# Patient Record
Sex: Male | Born: 1935
Health system: Southern US, Community
[De-identification: ages and names within clinical notes are randomized; demographics above are authoritative.]

## PROBLEM LIST (undated history)

## (undated) DIAGNOSIS — I25810 Atherosclerosis of coronary artery bypass graft(s) without angina pectoris: Secondary | ICD-10-CM

## (undated) DIAGNOSIS — N400 Enlarged prostate without lower urinary tract symptoms: Secondary | ICD-10-CM

## (undated) DIAGNOSIS — I10 Essential (primary) hypertension: Secondary | ICD-10-CM

## (undated) DIAGNOSIS — M75100 Unspecified rotator cuff tear or rupture of unspecified shoulder, not specified as traumatic: Secondary | ICD-10-CM

## (undated) DIAGNOSIS — E785 Hyperlipidemia, unspecified: Secondary | ICD-10-CM

## (undated) DIAGNOSIS — I359 Nonrheumatic aortic valve disorder, unspecified: Secondary | ICD-10-CM

## (undated) DIAGNOSIS — Z8551 Personal history of malignant neoplasm of bladder: Secondary | ICD-10-CM

## (undated) DIAGNOSIS — I679 Cerebrovascular disease, unspecified: Secondary | ICD-10-CM

## (undated) DIAGNOSIS — I719 Aortic aneurysm of unspecified site, without rupture: Secondary | ICD-10-CM

## (undated) DIAGNOSIS — C801 Malignant (primary) neoplasm, unspecified: Secondary | ICD-10-CM

## (undated) DIAGNOSIS — E119 Type 2 diabetes mellitus without complications: Secondary | ICD-10-CM

## (undated) HISTORY — DX: Malignant (primary) neoplasm, unspecified: C80.1

## (undated) HISTORY — PX: BLADDER TUMOR EXCISION: SHX238

## (undated) HISTORY — DX: Personal history of malignant neoplasm of bladder: Z85.51

## (undated) HISTORY — DX: Aortic aneurysm of unspecified site, without rupture: I71.9

## (undated) HISTORY — DX: Type 2 diabetes mellitus without complications: E11.9

## (undated) HISTORY — DX: Benign prostatic hyperplasia without lower urinary tract symptoms: N40.0

## (undated) HISTORY — DX: Hyperlipidemia, unspecified: E78.5

## (undated) HISTORY — DX: Unspecified rotator cuff tear or rupture of unspecified shoulder, not specified as traumatic: M75.100

## (undated) HISTORY — DX: Cerebrovascular disease, unspecified: I67.9

## (undated) HISTORY — DX: Atherosclerosis of coronary artery bypass graft(s) without angina pectoris: I25.810

## (undated) HISTORY — DX: Essential (primary) hypertension: I10

## (undated) HISTORY — PX: COLONOSCOPY W/ POLYPECTOMY: SHX1380

## (undated) HISTORY — DX: Nonrheumatic aortic valve disorder, unspecified: I35.9

---

## 1978-01-21 HISTORY — PX: BLEPHAROPLASTY: SUR158

## 1997-08-04 ENCOUNTER — Ambulatory Visit (HOSPITAL_COMMUNITY): Admission: RE | Admit: 1997-08-04 | Discharge: 1997-08-04 | Payer: Self-pay | Admitting: Urology

## 1999-03-29 ENCOUNTER — Encounter (INDEPENDENT_AMBULATORY_CARE_PROVIDER_SITE_OTHER): Payer: Self-pay | Admitting: Specialist

## 1999-03-29 ENCOUNTER — Ambulatory Visit (HOSPITAL_COMMUNITY): Admission: RE | Admit: 1999-03-29 | Discharge: 1999-03-29 | Payer: Self-pay | Admitting: Urology

## 1999-05-14 ENCOUNTER — Encounter: Payer: Self-pay | Admitting: Family Medicine

## 1999-05-14 ENCOUNTER — Encounter: Admission: RE | Admit: 1999-05-14 | Discharge: 1999-05-14 | Payer: Self-pay | Admitting: Family Medicine

## 1999-07-16 ENCOUNTER — Other Ambulatory Visit: Admission: RE | Admit: 1999-07-16 | Discharge: 1999-07-16 | Payer: Self-pay | Admitting: Internal Medicine

## 2000-08-04 ENCOUNTER — Encounter: Admission: RE | Admit: 2000-08-04 | Discharge: 2000-08-04 | Payer: Self-pay | Admitting: Family Medicine

## 2000-08-04 ENCOUNTER — Encounter: Payer: Self-pay | Admitting: Family Medicine

## 2001-01-21 HISTORY — PX: BACK SURGERY: SHX140

## 2001-02-23 ENCOUNTER — Encounter: Payer: Self-pay | Admitting: Family Medicine

## 2001-02-23 ENCOUNTER — Encounter: Admission: RE | Admit: 2001-02-23 | Discharge: 2001-02-23 | Payer: Self-pay | Admitting: Family Medicine

## 2001-03-18 ENCOUNTER — Encounter: Payer: Self-pay | Admitting: Neurosurgery

## 2001-03-18 ENCOUNTER — Ambulatory Visit (HOSPITAL_COMMUNITY): Admission: RE | Admit: 2001-03-18 | Discharge: 2001-03-18 | Payer: Self-pay | Admitting: Neurosurgery

## 2001-03-23 ENCOUNTER — Inpatient Hospital Stay (HOSPITAL_COMMUNITY): Admission: RE | Admit: 2001-03-23 | Discharge: 2001-03-24 | Payer: Self-pay | Admitting: Neurosurgery

## 2001-03-23 ENCOUNTER — Encounter: Payer: Self-pay | Admitting: Neurosurgery

## 2001-03-31 ENCOUNTER — Ambulatory Visit (HOSPITAL_COMMUNITY): Admission: RE | Admit: 2001-03-31 | Discharge: 2001-03-31 | Payer: Self-pay | Admitting: Neurosurgery

## 2003-12-01 ENCOUNTER — Ambulatory Visit: Payer: Self-pay | Admitting: Family Medicine

## 2003-12-30 ENCOUNTER — Ambulatory Visit: Payer: Self-pay | Admitting: Internal Medicine

## 2004-01-09 ENCOUNTER — Ambulatory Visit: Payer: Self-pay

## 2004-01-27 ENCOUNTER — Ambulatory Visit: Payer: Self-pay | Admitting: Internal Medicine

## 2004-01-31 ENCOUNTER — Ambulatory Visit: Payer: Self-pay | Admitting: Internal Medicine

## 2004-05-14 ENCOUNTER — Ambulatory Visit: Payer: Self-pay | Admitting: Internal Medicine

## 2004-07-27 ENCOUNTER — Ambulatory Visit: Payer: Self-pay | Admitting: Internal Medicine

## 2004-09-21 ENCOUNTER — Encounter: Admission: RE | Admit: 2004-09-21 | Discharge: 2004-09-21 | Payer: Self-pay | Admitting: Internal Medicine

## 2004-11-21 ENCOUNTER — Ambulatory Visit: Payer: Self-pay | Admitting: Internal Medicine

## 2004-12-25 ENCOUNTER — Ambulatory Visit: Payer: Self-pay | Admitting: Internal Medicine

## 2005-03-25 ENCOUNTER — Ambulatory Visit: Payer: Self-pay | Admitting: Internal Medicine

## 2005-04-08 ENCOUNTER — Ambulatory Visit: Payer: Self-pay | Admitting: Internal Medicine

## 2005-05-01 ENCOUNTER — Ambulatory Visit: Payer: Self-pay | Admitting: Internal Medicine

## 2005-08-26 ENCOUNTER — Ambulatory Visit: Payer: Self-pay | Admitting: Internal Medicine

## 2005-09-02 ENCOUNTER — Ambulatory Visit: Payer: Self-pay | Admitting: Internal Medicine

## 2005-12-20 ENCOUNTER — Ambulatory Visit: Payer: Self-pay | Admitting: Internal Medicine

## 2006-01-03 ENCOUNTER — Ambulatory Visit: Payer: Self-pay | Admitting: Internal Medicine

## 2006-01-03 ENCOUNTER — Encounter (INDEPENDENT_AMBULATORY_CARE_PROVIDER_SITE_OTHER): Payer: Self-pay | Admitting: Specialist

## 2006-01-21 HISTORY — PX: LUMBAR EPIDURAL INJECTION: SHX1980

## 2006-02-26 ENCOUNTER — Ambulatory Visit: Payer: Self-pay | Admitting: Internal Medicine

## 2006-02-26 LAB — CONVERTED CEMR LAB
BUN: 26 mg/dL — ABNORMAL HIGH (ref 6–23)
Cholesterol: 223 mg/dL (ref 0–200)
Creatinine, Ser: 1.3 mg/dL (ref 0.4–1.5)
Creatinine,U: 90.2 mg/dL
Hgb A1c MFr Bld: 6.2 % — ABNORMAL HIGH (ref 4.6–6.0)
Microalb Creat Ratio: 3.3 mg/g (ref 0.0–30.0)
Microalb, Ur: 0.3 mg/dL (ref 0.0–1.9)
Total CHOL/HDL Ratio: 5.4
Triglycerides: 383 mg/dL (ref 0–149)

## 2006-03-10 ENCOUNTER — Ambulatory Visit: Payer: Self-pay | Admitting: Internal Medicine

## 2006-08-01 ENCOUNTER — Encounter: Payer: Self-pay | Admitting: Internal Medicine

## 2006-08-08 ENCOUNTER — Ambulatory Visit: Payer: Self-pay | Admitting: Internal Medicine

## 2006-08-15 ENCOUNTER — Encounter (INDEPENDENT_AMBULATORY_CARE_PROVIDER_SITE_OTHER): Payer: Self-pay | Admitting: *Deleted

## 2006-08-15 LAB — CONVERTED CEMR LAB
Creatinine,U: 127.5 mg/dL
Microalb, Ur: 1.1 mg/dL (ref 0.0–1.9)

## 2006-08-18 ENCOUNTER — Ambulatory Visit: Payer: Self-pay | Admitting: Internal Medicine

## 2006-08-18 DIAGNOSIS — I359 Nonrheumatic aortic valve disorder, unspecified: Secondary | ICD-10-CM | POA: Insufficient documentation

## 2006-08-18 LAB — CONVERTED CEMR LAB
Cholesterol, target level: 200 mg/dL
LDL Goal: 100 mg/dL

## 2006-08-20 ENCOUNTER — Encounter: Payer: Self-pay | Admitting: Internal Medicine

## 2006-09-08 ENCOUNTER — Ambulatory Visit: Payer: Self-pay | Admitting: Cardiology

## 2006-09-16 ENCOUNTER — Encounter: Payer: Self-pay | Admitting: Internal Medicine

## 2006-09-17 ENCOUNTER — Encounter: Payer: Self-pay | Admitting: Internal Medicine

## 2006-09-17 ENCOUNTER — Encounter: Payer: Self-pay | Admitting: Cardiology

## 2006-09-17 ENCOUNTER — Ambulatory Visit: Payer: Self-pay | Admitting: Cardiology

## 2006-09-17 ENCOUNTER — Ambulatory Visit: Payer: Self-pay

## 2006-09-17 LAB — CONVERTED CEMR LAB
Calcium: 9.4 mg/dL (ref 8.4–10.5)
Chloride: 104 meq/L (ref 96–112)
Creatinine, Ser: 1 mg/dL (ref 0.4–1.5)
GFR calc non Af Amer: 79 mL/min
Glucose, Bld: 85 mg/dL (ref 70–99)
Potassium: 4 meq/L (ref 3.5–5.1)

## 2006-11-05 ENCOUNTER — Ambulatory Visit: Payer: Self-pay | Admitting: Cardiology

## 2006-12-17 ENCOUNTER — Ambulatory Visit: Payer: Self-pay | Admitting: Internal Medicine

## 2006-12-17 DIAGNOSIS — E785 Hyperlipidemia, unspecified: Secondary | ICD-10-CM | POA: Insufficient documentation

## 2006-12-17 DIAGNOSIS — T887XXA Unspecified adverse effect of drug or medicament, initial encounter: Secondary | ICD-10-CM

## 2006-12-17 DIAGNOSIS — I10 Essential (primary) hypertension: Secondary | ICD-10-CM | POA: Insufficient documentation

## 2006-12-22 ENCOUNTER — Encounter (INDEPENDENT_AMBULATORY_CARE_PROVIDER_SITE_OTHER): Payer: Self-pay | Admitting: *Deleted

## 2006-12-22 LAB — CONVERTED CEMR LAB
ALT: 24 units/L (ref 0–53)
AST: 30 units/L (ref 0–37)
Cholesterol: 190 mg/dL (ref 0–200)
Creatinine,U: 184.2 mg/dL
Microalb, Ur: 0.8 mg/dL (ref 0.0–1.9)
Total CHOL/HDL Ratio: 4.7
Triglycerides: 136 mg/dL (ref 0–149)
VLDL: 27 mg/dL (ref 0–40)

## 2006-12-30 ENCOUNTER — Encounter: Payer: Self-pay | Admitting: Internal Medicine

## 2007-01-05 ENCOUNTER — Encounter: Payer: Self-pay | Admitting: Internal Medicine

## 2007-01-05 ENCOUNTER — Telehealth (INDEPENDENT_AMBULATORY_CARE_PROVIDER_SITE_OTHER): Payer: Self-pay | Admitting: *Deleted

## 2007-01-12 ENCOUNTER — Ambulatory Visit: Payer: Self-pay | Admitting: Internal Medicine

## 2007-01-12 ENCOUNTER — Telehealth (INDEPENDENT_AMBULATORY_CARE_PROVIDER_SITE_OTHER): Payer: Self-pay | Admitting: *Deleted

## 2007-01-12 DIAGNOSIS — N400 Enlarged prostate without lower urinary tract symptoms: Secondary | ICD-10-CM | POA: Insufficient documentation

## 2007-01-12 DIAGNOSIS — IMO0001 Reserved for inherently not codable concepts without codable children: Secondary | ICD-10-CM

## 2007-01-13 ENCOUNTER — Ambulatory Visit: Payer: Self-pay | Admitting: Internal Medicine

## 2007-01-14 ENCOUNTER — Ambulatory Visit: Payer: Self-pay | Admitting: Internal Medicine

## 2007-01-14 ENCOUNTER — Encounter (INDEPENDENT_AMBULATORY_CARE_PROVIDER_SITE_OTHER): Payer: Self-pay | Admitting: *Deleted

## 2007-01-19 ENCOUNTER — Encounter: Payer: Self-pay | Admitting: Internal Medicine

## 2007-01-19 ENCOUNTER — Encounter (INDEPENDENT_AMBULATORY_CARE_PROVIDER_SITE_OTHER): Payer: Self-pay | Admitting: *Deleted

## 2007-01-22 HISTORY — PX: ROTATOR CUFF REPAIR: SHX139

## 2007-01-26 ENCOUNTER — Ambulatory Visit: Payer: Self-pay | Admitting: Cardiology

## 2007-02-03 ENCOUNTER — Ambulatory Visit (HOSPITAL_COMMUNITY): Admission: RE | Admit: 2007-02-03 | Discharge: 2007-02-04 | Payer: Self-pay | Admitting: Orthopedic Surgery

## 2007-02-06 ENCOUNTER — Telehealth (INDEPENDENT_AMBULATORY_CARE_PROVIDER_SITE_OTHER): Payer: Self-pay | Admitting: *Deleted

## 2007-02-06 ENCOUNTER — Encounter: Payer: Self-pay | Admitting: Internal Medicine

## 2007-02-09 ENCOUNTER — Ambulatory Visit: Payer: Self-pay | Admitting: Internal Medicine

## 2007-02-27 ENCOUNTER — Encounter: Payer: Self-pay | Admitting: Internal Medicine

## 2007-02-27 ENCOUNTER — Telehealth (INDEPENDENT_AMBULATORY_CARE_PROVIDER_SITE_OTHER): Payer: Self-pay | Admitting: *Deleted

## 2007-03-30 ENCOUNTER — Ambulatory Visit: Payer: Self-pay | Admitting: Cardiology

## 2007-04-03 ENCOUNTER — Encounter: Payer: Self-pay | Admitting: Internal Medicine

## 2007-04-07 ENCOUNTER — Ambulatory Visit: Payer: Self-pay | Admitting: Internal Medicine

## 2007-04-10 ENCOUNTER — Telehealth (INDEPENDENT_AMBULATORY_CARE_PROVIDER_SITE_OTHER): Payer: Self-pay | Admitting: *Deleted

## 2007-04-10 ENCOUNTER — Encounter (INDEPENDENT_AMBULATORY_CARE_PROVIDER_SITE_OTHER): Payer: Self-pay | Admitting: *Deleted

## 2007-04-13 ENCOUNTER — Ambulatory Visit: Payer: Self-pay | Admitting: Internal Medicine

## 2007-05-08 ENCOUNTER — Encounter: Payer: Self-pay | Admitting: Internal Medicine

## 2007-05-19 ENCOUNTER — Ambulatory Visit: Payer: Self-pay | Admitting: Cardiology

## 2007-05-19 LAB — CONVERTED CEMR LAB
AST: 26 units/L (ref 0–37)
Albumin: 4 g/dL (ref 3.5–5.2)
BUN: 23 mg/dL (ref 6–23)
Bilirubin, Direct: 0.1 mg/dL (ref 0.0–0.3)
CO2: 28 meq/L (ref 19–32)
Calcium: 9.4 mg/dL (ref 8.4–10.5)
Creatinine, Ser: 1.1 mg/dL (ref 0.4–1.5)
Direct LDL: 102.1 mg/dL
GFR calc non Af Amer: 70 mL/min
Glucose, Bld: 101 mg/dL — ABNORMAL HIGH (ref 70–99)
Sodium: 139 meq/L (ref 135–145)
Total Protein: 6.8 g/dL (ref 6.0–8.3)
VLDL: 59 mg/dL — ABNORMAL HIGH (ref 0–40)

## 2007-06-10 ENCOUNTER — Ambulatory Visit: Payer: Self-pay | Admitting: Cardiology

## 2007-09-16 ENCOUNTER — Encounter: Payer: Self-pay | Admitting: Internal Medicine

## 2007-09-16 ENCOUNTER — Ambulatory Visit: Payer: Self-pay

## 2007-09-16 ENCOUNTER — Encounter: Payer: Self-pay | Admitting: Cardiology

## 2007-09-16 ENCOUNTER — Ambulatory Visit: Payer: Self-pay | Admitting: Cardiology

## 2007-09-16 LAB — CONVERTED CEMR LAB
ALT: 25 units/L (ref 0–53)
Albumin: 3.9 g/dL (ref 3.5–5.2)
Cholesterol: 169 mg/dL (ref 0–200)
LDL Cholesterol: 99 mg/dL (ref 0–99)
Triglycerides: 154 mg/dL — ABNORMAL HIGH (ref 0–149)

## 2007-11-18 ENCOUNTER — Telehealth (INDEPENDENT_AMBULATORY_CARE_PROVIDER_SITE_OTHER): Payer: Self-pay | Admitting: *Deleted

## 2007-12-01 ENCOUNTER — Encounter (INDEPENDENT_AMBULATORY_CARE_PROVIDER_SITE_OTHER): Payer: Self-pay | Admitting: *Deleted

## 2007-12-10 ENCOUNTER — Telehealth: Payer: Self-pay | Admitting: Internal Medicine

## 2007-12-10 ENCOUNTER — Ambulatory Visit: Payer: Self-pay | Admitting: Internal Medicine

## 2007-12-21 ENCOUNTER — Ambulatory Visit: Payer: Self-pay | Admitting: Internal Medicine

## 2007-12-21 DIAGNOSIS — Z8551 Personal history of malignant neoplasm of bladder: Secondary | ICD-10-CM

## 2007-12-21 LAB — CONVERTED CEMR LAB
BUN: 22 mg/dL (ref 6–23)
Creatinine, Ser: 1 mg/dL (ref 0.4–1.5)
Creatinine,U: 163 mg/dL
Microalb Creat Ratio: 6.1 mg/g (ref 0.0–30.0)
Microalb, Ur: 1 mg/dL (ref 0.0–1.9)

## 2007-12-22 ENCOUNTER — Ambulatory Visit: Payer: Self-pay | Admitting: Internal Medicine

## 2007-12-23 ENCOUNTER — Telehealth (INDEPENDENT_AMBULATORY_CARE_PROVIDER_SITE_OTHER): Payer: Self-pay | Admitting: *Deleted

## 2007-12-28 ENCOUNTER — Encounter: Payer: Self-pay | Admitting: Internal Medicine

## 2008-03-07 ENCOUNTER — Ambulatory Visit: Payer: Self-pay | Admitting: Internal Medicine

## 2008-03-07 ENCOUNTER — Ambulatory Visit: Payer: Self-pay | Admitting: Cardiology

## 2008-03-07 LAB — CONVERTED CEMR LAB
BUN: 29 mg/dL — ABNORMAL HIGH (ref 6–23)
Creatinine, Ser: 1.1 mg/dL (ref 0.4–1.5)
Hgb A1c MFr Bld: 6.3 % — ABNORMAL HIGH (ref 4.6–6.0)

## 2008-03-14 ENCOUNTER — Ambulatory Visit: Payer: Self-pay | Admitting: Internal Medicine

## 2008-03-14 DIAGNOSIS — E119 Type 2 diabetes mellitus without complications: Secondary | ICD-10-CM

## 2008-03-14 DIAGNOSIS — L609 Nail disorder, unspecified: Secondary | ICD-10-CM | POA: Insufficient documentation

## 2008-03-16 ENCOUNTER — Encounter (INDEPENDENT_AMBULATORY_CARE_PROVIDER_SITE_OTHER): Payer: Self-pay | Admitting: *Deleted

## 2008-03-19 ENCOUNTER — Encounter: Payer: Self-pay | Admitting: Internal Medicine

## 2008-05-20 ENCOUNTER — Ambulatory Visit: Payer: Self-pay | Admitting: Internal Medicine

## 2008-05-20 DIAGNOSIS — M2550 Pain in unspecified joint: Secondary | ICD-10-CM | POA: Insufficient documentation

## 2008-05-20 LAB — CONVERTED CEMR LAB
Blood in Urine, dipstick: NEGATIVE
Protein, U semiquant: NEGATIVE
WBC Urine, dipstick: NEGATIVE
pH: 5

## 2008-07-13 ENCOUNTER — Ambulatory Visit: Payer: Self-pay | Admitting: Internal Medicine

## 2008-07-13 DIAGNOSIS — R609 Edema, unspecified: Secondary | ICD-10-CM

## 2008-07-21 ENCOUNTER — Encounter (INDEPENDENT_AMBULATORY_CARE_PROVIDER_SITE_OTHER): Payer: Self-pay | Admitting: *Deleted

## 2008-07-21 ENCOUNTER — Telehealth: Payer: Self-pay | Admitting: Cardiology

## 2008-07-21 LAB — CONVERTED CEMR LAB
ALT: 22 units/L (ref 0–53)
AST: 28 units/L (ref 0–37)

## 2008-09-12 ENCOUNTER — Ambulatory Visit: Payer: Self-pay | Admitting: Internal Medicine

## 2008-09-12 LAB — CONVERTED CEMR LAB
Creatinine,U: 173 mg/dL
Microalb, Ur: 0.7 mg/dL (ref 0.0–1.9)

## 2008-09-16 ENCOUNTER — Encounter: Payer: Self-pay | Admitting: Internal Medicine

## 2008-09-19 ENCOUNTER — Ambulatory Visit: Payer: Self-pay | Admitting: Internal Medicine

## 2008-09-21 ENCOUNTER — Encounter: Payer: Self-pay | Admitting: Cardiology

## 2008-09-21 ENCOUNTER — Ambulatory Visit: Payer: Self-pay

## 2008-09-28 ENCOUNTER — Telehealth: Payer: Self-pay | Admitting: Cardiology

## 2008-10-03 ENCOUNTER — Ambulatory Visit: Payer: Self-pay | Admitting: Internal Medicine

## 2008-10-12 ENCOUNTER — Ambulatory Visit: Payer: Self-pay

## 2008-10-12 ENCOUNTER — Encounter: Payer: Self-pay | Admitting: Cardiology

## 2008-10-19 ENCOUNTER — Ambulatory Visit: Payer: Self-pay | Admitting: Internal Medicine

## 2008-12-07 ENCOUNTER — Encounter (INDEPENDENT_AMBULATORY_CARE_PROVIDER_SITE_OTHER): Payer: Self-pay | Admitting: *Deleted

## 2008-12-19 ENCOUNTER — Telehealth (INDEPENDENT_AMBULATORY_CARE_PROVIDER_SITE_OTHER): Payer: Self-pay | Admitting: *Deleted

## 2009-01-21 HISTORY — PX: CORONARY ARTERY BYPASS GRAFT: SHX141

## 2009-02-08 ENCOUNTER — Encounter: Payer: Self-pay | Admitting: Internal Medicine

## 2009-04-03 ENCOUNTER — Ambulatory Visit: Payer: Self-pay | Admitting: Internal Medicine

## 2009-04-28 ENCOUNTER — Telehealth (INDEPENDENT_AMBULATORY_CARE_PROVIDER_SITE_OTHER): Payer: Self-pay | Admitting: *Deleted

## 2009-05-17 ENCOUNTER — Ambulatory Visit: Payer: Self-pay | Admitting: Internal Medicine

## 2009-05-22 ENCOUNTER — Telehealth (INDEPENDENT_AMBULATORY_CARE_PROVIDER_SITE_OTHER): Payer: Self-pay | Admitting: *Deleted

## 2009-05-22 LAB — CONVERTED CEMR LAB
Creatinine, Ser: 1 mg/dL (ref 0.4–1.5)
Creatinine,U: 73.1 mg/dL
Hgb A1c MFr Bld: 6.4 % (ref 4.6–6.5)

## 2009-05-24 ENCOUNTER — Ambulatory Visit: Payer: Self-pay | Admitting: Internal Medicine

## 2009-05-24 DIAGNOSIS — R079 Chest pain, unspecified: Secondary | ICD-10-CM | POA: Insufficient documentation

## 2009-05-26 ENCOUNTER — Ambulatory Visit: Payer: Self-pay | Admitting: Internal Medicine

## 2009-06-02 ENCOUNTER — Encounter (INDEPENDENT_AMBULATORY_CARE_PROVIDER_SITE_OTHER): Payer: Self-pay | Admitting: *Deleted

## 2009-06-02 ENCOUNTER — Ambulatory Visit: Payer: Self-pay | Admitting: Cardiology

## 2009-06-02 DIAGNOSIS — I208 Other forms of angina pectoris: Secondary | ICD-10-CM

## 2009-06-02 DIAGNOSIS — I679 Cerebrovascular disease, unspecified: Secondary | ICD-10-CM

## 2009-06-05 LAB — CONVERTED CEMR LAB
Albumin: 4 g/dL (ref 3.5–5.2)
BUN: 27 mg/dL — ABNORMAL HIGH (ref 6–23)
Calcium: 9.4 mg/dL (ref 8.4–10.5)
Chloride: 100 meq/L (ref 96–112)
Chloride: 102 meq/L (ref 96–112)
Creatinine, Ser: 0.9 mg/dL (ref 0.4–1.5)
Creatinine, Ser: 1.09 mg/dL (ref 0.40–1.50)
Eosinophils Absolute: 0.4 10*3/uL (ref 0.0–0.7)
Eosinophils Relative: 4 % (ref 0–5)
GFR calc non Af Amer: 90.23 mL/min (ref >60)
Glucose, Bld: 89 mg/dL (ref 70–99)
HCT: 42 % (ref 39.0–52.0)
HDL: 49.2 mg/dL (ref >39.00)
INR: 0.98 (ref <1.50)
Lymphocytes Relative: 31 % (ref 12–46)
Neutro Abs: 4.4 10*3/uL (ref 1.7–7.7)
Neutrophils Relative %: 55 % (ref 43–77)
Platelets: 203 10*3/uL (ref 150–400)
Potassium: 5 meq/L (ref 3.5–5.1)
Prothrombin Time: 12.9 s (ref 11.6–15.2)
RBC: 4.91 M/uL (ref 4.22–5.81)
RDW: 14.1 % (ref 11.5–15.5)
Total Bilirubin: 0.7 mg/dL (ref 0.3–1.2)
Total Protein: 6.9 g/dL (ref 6.0–8.3)

## 2009-06-07 ENCOUNTER — Inpatient Hospital Stay (HOSPITAL_COMMUNITY): Admission: AD | Admit: 2009-06-07 | Discharge: 2009-06-12 | Payer: Self-pay | Admitting: Cardiovascular Disease

## 2009-06-07 ENCOUNTER — Ambulatory Visit: Payer: Self-pay | Admitting: Thoracic Surgery (Cardiothoracic Vascular Surgery)

## 2009-06-07 ENCOUNTER — Encounter: Payer: Self-pay | Admitting: Internal Medicine

## 2009-06-07 ENCOUNTER — Ambulatory Visit: Payer: Self-pay | Admitting: Cardiology

## 2009-06-07 ENCOUNTER — Ambulatory Visit: Payer: Self-pay | Admitting: Cardiovascular Disease

## 2009-06-07 ENCOUNTER — Inpatient Hospital Stay (HOSPITAL_BASED_OUTPATIENT_CLINIC_OR_DEPARTMENT_OTHER): Admission: RE | Admit: 2009-06-07 | Discharge: 2009-06-07 | Payer: Self-pay | Admitting: Cardiovascular Disease

## 2009-06-08 ENCOUNTER — Encounter: Payer: Self-pay | Admitting: Cardiology

## 2009-06-15 ENCOUNTER — Encounter: Payer: Self-pay | Admitting: Cardiovascular Disease

## 2009-06-16 ENCOUNTER — Encounter: Payer: Self-pay | Admitting: Physician Assistant

## 2009-06-20 ENCOUNTER — Ambulatory Visit: Payer: Self-pay | Admitting: Internal Medicine

## 2009-06-20 ENCOUNTER — Encounter: Payer: Self-pay | Admitting: Physician Assistant

## 2009-06-20 DIAGNOSIS — Z951 Presence of aortocoronary bypass graft: Secondary | ICD-10-CM | POA: Insufficient documentation

## 2009-06-20 DIAGNOSIS — I25810 Atherosclerosis of coronary artery bypass graft(s) without angina pectoris: Secondary | ICD-10-CM

## 2009-06-28 ENCOUNTER — Ambulatory Visit: Payer: Self-pay | Admitting: Internal Medicine

## 2009-06-28 DIAGNOSIS — G479 Sleep disorder, unspecified: Secondary | ICD-10-CM | POA: Insufficient documentation

## 2009-07-03 ENCOUNTER — Ambulatory Visit: Payer: Self-pay | Admitting: Thoracic Surgery (Cardiothoracic Vascular Surgery)

## 2009-07-03 ENCOUNTER — Encounter
Admission: RE | Admit: 2009-07-03 | Discharge: 2009-07-03 | Payer: Self-pay | Admitting: Thoracic Surgery (Cardiothoracic Vascular Surgery)

## 2009-07-03 ENCOUNTER — Encounter: Payer: Self-pay | Admitting: Internal Medicine

## 2009-07-03 ENCOUNTER — Telehealth: Payer: Self-pay | Admitting: Cardiology

## 2009-07-04 ENCOUNTER — Telehealth (INDEPENDENT_AMBULATORY_CARE_PROVIDER_SITE_OTHER): Payer: Self-pay | Admitting: *Deleted

## 2009-07-12 ENCOUNTER — Ambulatory Visit: Payer: Self-pay | Admitting: Thoracic Surgery (Cardiothoracic Vascular Surgery)

## 2009-07-12 ENCOUNTER — Encounter
Admission: RE | Admit: 2009-07-12 | Discharge: 2009-07-12 | Payer: Self-pay | Admitting: Thoracic Surgery (Cardiothoracic Vascular Surgery)

## 2009-07-13 ENCOUNTER — Encounter (HOSPITAL_COMMUNITY): Admission: RE | Admit: 2009-07-13 | Discharge: 2009-09-22 | Payer: Self-pay | Admitting: Cardiology

## 2009-07-25 ENCOUNTER — Encounter: Payer: Self-pay | Admitting: Internal Medicine

## 2009-07-26 ENCOUNTER — Telehealth (INDEPENDENT_AMBULATORY_CARE_PROVIDER_SITE_OTHER): Payer: Self-pay | Admitting: *Deleted

## 2009-07-27 ENCOUNTER — Encounter: Payer: Self-pay | Admitting: Cardiology

## 2009-07-28 ENCOUNTER — Ambulatory Visit: Payer: Self-pay | Admitting: Cardiology

## 2009-08-24 ENCOUNTER — Encounter: Payer: Self-pay | Admitting: Cardiology

## 2009-08-28 ENCOUNTER — Telehealth: Payer: Self-pay | Admitting: Cardiology

## 2009-09-07 ENCOUNTER — Telehealth: Payer: Self-pay | Admitting: Internal Medicine

## 2009-09-27 ENCOUNTER — Encounter: Payer: Self-pay | Admitting: Cardiology

## 2009-09-27 ENCOUNTER — Ambulatory Visit: Payer: Self-pay

## 2009-09-29 ENCOUNTER — Encounter: Payer: Self-pay | Admitting: Cardiology

## 2009-10-04 ENCOUNTER — Ambulatory Visit: Payer: Self-pay | Admitting: Cardiology

## 2009-10-04 ENCOUNTER — Encounter (INDEPENDENT_AMBULATORY_CARE_PROVIDER_SITE_OTHER): Payer: Self-pay | Admitting: *Deleted

## 2009-10-04 LAB — CONVERTED CEMR LAB
AST: 24 units/L (ref 0–37)
Cholesterol: 148 mg/dL (ref 0–200)
Total CHOL/HDL Ratio: 4
Triglycerides: 159 mg/dL — ABNORMAL HIGH (ref 0.0–149.0)

## 2009-10-19 ENCOUNTER — Encounter: Payer: Self-pay | Admitting: Internal Medicine

## 2009-10-20 ENCOUNTER — Telehealth (INDEPENDENT_AMBULATORY_CARE_PROVIDER_SITE_OTHER): Payer: Self-pay | Admitting: *Deleted

## 2009-10-30 ENCOUNTER — Telehealth (INDEPENDENT_AMBULATORY_CARE_PROVIDER_SITE_OTHER): Payer: Self-pay | Admitting: *Deleted

## 2009-11-03 ENCOUNTER — Ambulatory Visit: Payer: Self-pay | Admitting: Internal Medicine

## 2009-11-03 LAB — CONVERTED CEMR LAB
BUN: 25 mg/dL — ABNORMAL HIGH (ref 6–23)
Microalb Creat Ratio: 0.5 mg/g (ref 0.0–30.0)
Microalb, Ur: 0.5 mg/dL (ref 0.0–1.9)
Potassium: 4.5 meq/L (ref 3.5–5.1)

## 2009-11-08 ENCOUNTER — Ambulatory Visit: Payer: Self-pay | Admitting: Internal Medicine

## 2010-01-03 ENCOUNTER — Ambulatory Visit: Payer: Self-pay | Admitting: Cardiology

## 2010-01-03 ENCOUNTER — Encounter: Payer: Self-pay | Admitting: Cardiology

## 2010-01-03 DIAGNOSIS — I719 Aortic aneurysm of unspecified site, without rupture: Secondary | ICD-10-CM | POA: Insufficient documentation

## 2010-01-04 LAB — CONVERTED CEMR LAB
CO2: 28 meq/L (ref 19–32)
Chloride: 103 meq/L (ref 96–112)
Potassium: 4.3 meq/L (ref 3.5–5.1)
Sodium: 137 meq/L (ref 135–145)

## 2010-01-11 ENCOUNTER — Ambulatory Visit: Payer: Self-pay | Admitting: Cardiology

## 2010-01-11 ENCOUNTER — Encounter: Payer: Self-pay | Admitting: Cardiology

## 2010-01-11 ENCOUNTER — Ambulatory Visit (HOSPITAL_COMMUNITY)
Admission: RE | Admit: 2010-01-11 | Discharge: 2010-01-11 | Payer: Self-pay | Source: Home / Self Care | Attending: Cardiology | Admitting: Cardiology

## 2010-01-11 ENCOUNTER — Ambulatory Visit: Payer: Self-pay

## 2010-01-31 ENCOUNTER — Other Ambulatory Visit: Payer: Self-pay | Admitting: Internal Medicine

## 2010-01-31 ENCOUNTER — Ambulatory Visit
Admission: RE | Admit: 2010-01-31 | Discharge: 2010-01-31 | Payer: Self-pay | Source: Home / Self Care | Attending: Internal Medicine | Admitting: Internal Medicine

## 2010-01-31 LAB — BUN: BUN: 26 mg/dL — ABNORMAL HIGH (ref 6–23)

## 2010-01-31 LAB — HEMOGLOBIN A1C: Hgb A1c MFr Bld: 6.5 % (ref 4.6–6.5)

## 2010-01-31 LAB — POTASSIUM: Potassium: 5.1 mEq/L (ref 3.5–5.1)

## 2010-01-31 LAB — CREATININE, SERUM: Creatinine, Ser: 1 mg/dL (ref 0.4–1.5)

## 2010-02-14 ENCOUNTER — Encounter: Payer: Self-pay | Admitting: Internal Medicine

## 2010-02-14 ENCOUNTER — Ambulatory Visit
Admission: RE | Admit: 2010-02-14 | Discharge: 2010-02-14 | Payer: Self-pay | Source: Home / Self Care | Attending: Internal Medicine | Admitting: Internal Medicine

## 2010-02-20 NOTE — Letter (Signed)
Summary: Cardiac Catheterization Instructions- [facility]  [facility], Main Office  [address]   [place], [place] [address]   Phone: [phone]  Fax: [phone]     06/02/2009 MRN: [id number]  William Faulkner [address] [place], [place]  [address]  Dear William Faulkner,   You are scheduled for a Cardiac Catheterization on [facility] 06-07-09 with Dr. Sanjuana Kava  Please arrive to the 1st floor of the Heart and Vascular Center at [facility] at     7:30 am  on the day of your procedure. Please do not arrive before 6:30 a.m. Call the Heart and Vascular Center at [phone] if you are unable to make your appointmnet. The Code to get into the parking garage under the building is 0010. Take the elevators to the 1st floor. You must have someone to drive you home. Someone must be with you for the first 24 hours after you arrive home. Please wear clothes that are easy to get on and off and wear slip-on shoes. Do not eat or drink after midnight except water with your medications that morning. Bring all your medications and current insurance cards with you.  _XX__ DO NOT take these medications before your procedure: DO NOT TAKE GLIMPRIAZIDE WEDNESDAY MORNING--NO METFORMIN WEDNESDAY, THURSDAY OR FRIDAY  ___ Make sure you take your aspirin.  ___ You may take ALL of your medications with water that morning. ________________________________________________________________________________________________________________________________  ___ DO NOT take ANY medications before your procedure.  ___ Pre-med instructions:  ________________________________________________________________________________________________________________________________  The usual length of stay after your procedure is 2 to 3 hours. This can vary.  If you have any questions, please call the office at the number listed above.   Deliah Goody, RN

## 2010-02-20 NOTE — Progress Notes (Signed)
  Phone Note Outgoing Call Call back at [employer] Phone [phone]   Call placed by: Shonna Chock,  May 22, 2009 9:05 AM Call placed to: Patient Summary of Call: Spoke with patients wife and informed her to tell patient to bring all of his bloodsugar readings to his pending appointment on Wed 05/24/09, per Dr.Hopper./[facility]  May 22, 2009 9:07 AM

## 2010-02-20 NOTE — Letter (Signed)
Summary: Cardiac Rehabilitation Program  Cardiac Rehabilitation Program   Imported By: Debby Freiberg 07/19/2009 13:47:30  _____________________________________________________________________  External Attachment:    Type:   Image     Comment:   External Document

## 2010-02-20 NOTE — Progress Notes (Signed)
Summary: Labs and appt--SCHED LAB APPT BEFORE O/V  Phone Note Call from Patient Call back at Home Phone [phone]   Summary of Call: Patient spouse left message on triage asking if the patient could come in prior to his appt on the 19th for labs.  Initial call taken by: Lucious Groves CMA,  October 30, 2009 3:23 PM  Follow-up for Phone Call        I spoke with patient spouse and his lipids were just done in sept and the patient would only need a fasting A1c. Patient will do it all in one trip and come fasting to his appt.  Follow-up by: Lucious Groves CMA,  October 30, 2009 3:32 PM  Additional Follow-up for Phone Call Additional follow up Details #1::        A1c , urine microalbumin, BUN, creat, K+ pre appt( 250.00,401.9) please Additional Follow-up by: Marga Melnick MD,  October 31, 2009 6:19 AM    Additional Follow-up for Phone Call Additional follow up Details #2::    PUT PT ON LAB SCHED FOR THURSDAY 10/13 AT 8:20---LEFT MSG ON ANSWERING MACHINE TO CALL us BACK TO CONFIRM OR RESCHULE Follow-up by: Jerolyn Shin,  November 01, 2009 12:04 PM  Additional Follow-up for Phone Call Additional follow up Details #3:: Details for Additional Follow-up Action Taken: PATIENT CANCELLED LAB APPT FOR [facility] 10/13 AND REQUESTED FRIDAY 1014 AT [facility] Additional Follow-up by: Jerolyn Shin,  November 01, 2009 3:46 PM

## 2010-02-20 NOTE — Letter (Signed)
Summary: CMN for Diabetes Supplies/[facility]  CMN for Diabetes Supplies/[facility]   Imported By: Lanelle Bal 08/02/2009 11:16:23  _____________________________________________________________________  External Attachment:    Type:   Image     Comment:   External Document

## 2010-02-20 NOTE — Assessment & Plan Note (Signed)
Summary: OV per hopp//lch   Vital Signs:  Patient profile:   76 year old male Weight:      244.6 pounds Pulse rate:   64 / minute Resp:     15 per minute BP sitting:   116 / 70  (left arm) Cuff size:   large  Vitals Entered By: Shonna Chock (May 24, 2009 9:16 AM) CC: Discuss labs (copy given) Comments REVIEWED MED LIST, PATIENT AGREED DOSE AND INSTRUCTION CORRECT    CC:  Discuss labs (copy given).  History of Present Illness: A1c essentially stable @ 6.3%, 6.2% in 2010. FBS 98-120, average 115.Two hrs post meal < 150. Weight stable; no hypoglycemia. Walking 1.5-2 mpd every other day on average.He has had some chest "pressure" initially with walking  since he restarted walking after Winter layoff(see description below). Dr [name] has scheduled cardiovascular evaluation in 07/2009 of AI.       The patient reports exertional chest pain, but denies nausea, vomiting, diaphoresis, shortness of breath, syncope, and indigestion.  The pain is described as intermittent and pressure-like.  The pain is located in the substernal area and the pain does not radiate.  Episodes of chest pain last 2-5 minutes.  The pain is brought on or made worse by moderate  to  strenuous activity, mainly walking up hill.The pain resolves with continued walking.  No PMH of asthma or EIB.  Allergies: 1)  ! Sulfa 2)  Celebrex 3)  Lipitor  Past History:  Past Medical History: adverse effects of medication hypertension Hyperlipidemia Diabetes mellitus, type II Aortic Valve  Insufficiency/ AR bladder cancer, Dr Marcello Fennel  Past Surgical History: Rotator cuff repair 2009 bladder tumor, Surgery X 6, Dr Marcello Fennel lower back surgery colonoscopy  2002 & 2004  negative epidural steroids colonoscopy adenomatous polyps remotely  Review of Systems General:  Denies fatigue and weight loss. Eyes:  Denies blurring, double vision, and vision loss-both eyes; No retinopathy 1 yr ago. CV:  Complains of chest pain or  discomfort; denies difficulty breathing at night, difficulty breathing while lying down, leg cramps with exertion, shortness of breath with exertion, swelling of feet, and swelling of hands. Derm:  Denies poor wound healing; Podiatrist seen every 4 months. Neuro:  Complains of numbness; denies tingling; Some numbness in anterior shins with walking. Endo:  Denies excessive hunger and excessive thirst; Urination depends on volume intake.  Physical Exam  General:  Appears younger than age,well-nourished,in no acute distress; alert,appropriate and cooperative throughout examination Neck:  No deformities, masses, or tenderness noted. Lungs:  Normal respiratory effort, chest expands symmetrically. Lungs are clear to auscultation, no crackles or wheezes. Heart:  normal rate, regular rhythm, no gallop, no rub, no JVD, and no HJR.  AS/AI murmur present Abdomen:  Bowel sounds positive,abdomen soft and non-tender without masses, organomegaly or hernias noted. Pulses:  R and L carotid,radial  pulses are full and equal bilaterally. Decreased pedal pulses esp DPP w/o ischemia. L carotid bruit Extremities:  trace pitting  left pedal edema and trace pitting right pedal edema.  Minor R great toenail deformity Neurologic:  alert & oriented X3 and sensation intact to light touch over feet.   Skin:  Intact without suspicious lesions or rashes Psych:  memory intact for recent and remote, normally interactive, and good eye contact.     Impression & Recommendations:  Problem # 1:  CHEST PAIN (ICD-786.50)  Atypical in that it resolves with continued exercise  Orders: EKG w/ Interpretation (93000) T-2 View CXR (71020TC) Cardiology  Referral (Cardiology)  Problem # 2:  DIAB W/O COMP TYPE II/UNS NOT STATED UNCNTRL (ICD-250.00) well controlled His updated medication list for this problem includes:    Glimepiride 2 Mg Tabs (Glimepiride) .Marland Kitchen... 1/2 tab qd    Metformin Hcl 500 Mg Xr24h-tab (Metformin hcl) .Marland Kitchen... 1  two times a day with 2 largest meals    Adult Aspirin Low Strength 81 Mg Tbdp (Aspirin) .Marland Kitchen... 1 by mouth once daily    Benazepril Hcl 40 Mg Tabs (Benazepril hcl) .Marland Kitchen... 1 by mouth once daily  Problem # 3:  HYPERLIPIDEMIA (ICD-272.4)  His updated medication list for this problem includes:    Crestor 10 Mg Tabs (Rosuvastatin calcium) .Marland Kitchen... 1 by mouth once daily; monitor due  Problem # 4:  AORTIC INSUFFICIENCY (ICD-424.1)  as per Dr [name] His updated medication list for this problem includes:    Coreg 12.5 Mg Tabs (Carvedilol) .Marland Kitchen... 1 two times a day increase to 1 two times a day if bp > 145/85 off amlodipine    Adult Aspirin Low Strength 81 Mg Tbdp (Aspirin) .Marland Kitchen... 1 by mouth once daily  Orders: Cardiology Referral (Cardiology)  Problem # 5:  HYPERTENSION, ESSENTIAL NOS (ICD-401.9)  His updated medication list for this problem includes:    Coreg 12.5 Mg Tabs (Carvedilol) .Marland Kitchen... 1 two times a day increase to 1 two times a day if bp > 145/85 off amlodipine    Hydrochlorothiazide 12.5 Mg Tabs (Hydrochlorothiazide) .Marland Kitchen... 1 by mouth qd    Benazepril Hcl 40 Mg Tabs (Benazepril hcl) .Marland Kitchen... 1 by mouth once daily  Orders: Prescription Created Electronically [phone])  Complete Medication List: 1)  Glimepiride 2 Mg Tabs (Glimepiride) .... 1/2 tab qd 2)  Fish Oil 1000mg  Bid  3)  Folic Acid With Dha 2 Tabs Daily  4)  Coreg 12.5 Mg Tabs (Carvedilol) .Marland Kitchen.. 1 two times a day increase to 1 two times a day if bp > 145/85 off amlodipine 5)  Hydrochlorothiazide 12.5 Mg Tabs (Hydrochlorothiazide) .Marland Kitchen.. 1 by mouth qd 6)  Crestor 10 Mg Tabs (Rosuvastatin calcium) .Marland Kitchen.. 1 by mouth once daily 7)  Metformin Hcl 500 Mg Xr24h-tab (Metformin hcl) .Marland Kitchen.. 1 two times a day with 2 largest meals 8)  Adult Aspirin Low Strength 81 Mg Tbdp (Aspirin) .Marland Kitchen.. 1 by mouth once daily 9)  Benazepril Hcl 40 Mg Tabs (Benazepril hcl) .Marland Kitchen.. 1 by mouth once daily  Patient Instructions: 1)  Please schedule fasting labs; see Diagnoses  for Codes: 2)  BMP ;Hepatic Panel ;Lipid Panel ;TSH . Curtail activities (Ex walking uphill) which cause chest pain until seen by Dr [name]. Prescriptions: BENAZEPRIL HCL 40 MG TABS (BENAZEPRIL HCL) 1 by mouth once daily  #90 x 3   Entered and Authorized by:   Marga Melnick MD   Signed by:   Marga Melnick MD on 05/24/2009   Method used:   Faxed to ...       [facility] (retail)       [address]       [place], [place]  [address]       Ph: [phone]       Fax: [phone]   RxID:   [id number]

## 2010-02-20 NOTE — Progress Notes (Signed)
Summary: Pt request call   Phone Note Call from Patient Call back at Home Phone [phone]   Caller: Patient Summary of Call: Pt request call Initial call taken by: Judie Grieve,  August 28, 2009 2:32 PM  Follow-up for Phone Call        spoke with pt, he would like to know from dr [name] if he can play golf. will foward for dr [name] review Deliah Goody, RN  August 28, 2009 4:22 PM   Additional Follow-up for Phone Call Additional follow up Details #1::        needs clearance from cvts for sternotomy; ok from my standpoint Ferman Hamming, MD, [facility]  August 28, 2009 6:10 PM  pt aware of recommendations Deliah Goody, RN  August 29, 2009 3:04 PM

## 2010-02-20 NOTE — Miscellaneous (Signed)
Summary: [facility] Cardiac Progress Note   [facility] Cardiac Progress Note   Imported By: Roderic Ovens 10/04/2009 12:08:11  _____________________________________________________________________  External Attachment:    Type:   Image     Comment:   External Document

## 2010-02-20 NOTE — Miscellaneous (Signed)
Summary: Orders Update  Clinical Lists Changes  Orders: Added new Test order of Carotid Duplex (Carotid Duplex) - Signed 

## 2010-02-20 NOTE — Progress Notes (Signed)
Summary: METFORMIN--NEEDS TO BE CALLED IN TODAY  Phone Note Refill Request Message from:  Patient on October 20, 2009 1:16 PM  Refills Requested: Medication #1:  METFORMIN HCL 500 MG XR24H-TAB 1 two times a day with 2 largest meals **APPOINTMENT DUE**   Notes: HAS APPT FOR 10/19--WOULD LIKE 90 DAYS PRESCRIPTION [facility] [place]----NEEDS TO BE FILLED TODAY---GOING OUT OF TOWN TOMORROW  Initial call taken by: Jerolyn Shin,  October 20, 2009 1:19 PM    Prescriptions: METFORMIN HCL 500 MG XR24H-TAB (METFORMIN HCL) 1 two times a day with 2 largest meals **APPOINTMENT DUE**  #60 x 0   Entered by:   Shonna Chock CMA   Authorized by:   Marga Melnick MD   Signed by:   Shonna Chock CMA on 10/20/2009   Method used:   Electronically to        [facility] (retail)       [address]       [place], [place]  [address]       Ph: [phone]       Fax: [phone]   RxID:   [id number]

## 2010-02-20 NOTE — Progress Notes (Signed)
Summary: Appointment Due  Phone Note Outgoing Call   Call placed by: Shonna Chock,  April 28, 2009 4:53 PM Call placed to: Patient Summary of Call: Please call patient to schedule appointment:    schedule labs & OV (A1c , urine microalbumin, BUN,creat,K+; 250.00,995.20). Bring glucose readings to appt  [facility]  April 28, 2009 4:54 PM     Follow-up for Phone Call        Appointment scheduled, Patient called Follow-up by: Harold Barban,  May 01, 2009 11:47 AM

## 2010-02-20 NOTE — Letter (Signed)
Summary: Custom - Lipid  [facility], Main Office  [address]   [place], [place] [address]   Phone: [phone]  Fax: [phone]     October 04, 2009 MRN: [id number]   [facility] [address] [place], [place]  [address]   Dear Mr. Osbourne,  We have reviewed your cholesterol results.  They are as follows:     Total Cholesterol:    148 (Desirable: less than 200)       HDL  Cholesterol:     41.70  (Desirable: greater than 40 for men and 50 for women)       LDL Cholesterol:       75  (Desirable: less than 100 for low risk and less than 70 for moderate to high risk)       Triglycerides:       159.0  (Desirable: less than 150)  Our recommendations include:These numbers look good. Continue on the same medicine. Liver function is normal. Take care, Dr. Darel Hong.    Call our office at the number listed above if you have any questions.  Lowering your LDL cholesterol is important, but it is only one of a large number of "risk factors" that may indicate that you are at risk for heart disease, stroke or other complications of hardening of the arteries.  Other risk factors include:   A.  Cigarette Smoking* B.  High Blood Pressure* C.  Obesity* D.   Low HDL Cholesterol (see yours above)* E.   Diabetes Mellitus (higher risk if your is uncontrolled) F.  Family history of premature heart disease G.  Previous history of stroke or cardiovascular disease    *These are risk factors YOU HAVE CONTROL OVER.  For more information, visit .  There is now evidence that lowering the TOTAL CHOLESTEROL AND LDL CHOLESTEROL can reduce the risk of heart disease.  The American Heart Association recommends the following guidelines for the treatment of elevated cholesterol:  1.  If there is now current heart disease and less than two risk factors, TOTAL CHOLESTEROL should be less than 200 and LDL CHOLESTEROL should be less than 100. 2.  If there is current heart  disease or two or more risk factors, TOTAL CHOLESTEROL should be less than 200 and LDL CHOLESTEROL should be less than 70.  A diet low in cholesterol, saturated fat, and calories is the cornerstone of treatment for elevated cholesterol.  Cessation of smoking and exercise are also important in the management of elevated cholesterol and preventing vascular disease.  Studies have shown that 30 to 60 minutes of physical activity most days can help lower blood pressure, lower cholesterol, and keep your weight at a healthy level.  Drug therapy is used when cholesterol levels do not respond to therapeutic lifestyle changes (smoking cessation, diet, and exercise) and remains unacceptably high.  If medication is started, it is important to have you levels checked periodically to evaluate the need for further treatment options.  Thank you,    [facility] Team

## 2010-02-20 NOTE — Miscellaneous (Signed)
Summary: Rehab Report  Rehab Report   Imported By: Dixie Dials 07/12/2009 13:28:47  _____________________________________________________________________  External Attachment:    Type:   Image     Comment:   External Document

## 2010-02-20 NOTE — Progress Notes (Signed)
Summary: REFILL REQUEST  Phone Note Refill Request Call back at [phone] Message from:  Pharmacy on July 26, 2009 2:58 PM  Refills Requested: Medication #1:  GLIMEPIRIDE 2 MG  TABS 1/2 tab qd   Dosage confirmed as above?Dosage Confirmed   Supply Requested: 1 month   Last Refilled: 04/28/2009 [facility]  Next Appointment Scheduled: NONE Initial call taken by: Lavell Islam,  July 26, 2009 2:59 PM    Prescriptions: GLIMEPIRIDE 2 MG  TABS (GLIMEPIRIDE) 1/2 tab qd  #45 Not Speci x 3   Entered by:   Shonna Chock   Authorized by:   Marga Melnick MD   Signed by:   Shonna Chock on 07/26/2009   Method used:   Electronically to        [facility] (retail)       [address]       [place], [place]  [address]       Ph: [phone]       Fax: [phone]   RxID:   [id number]

## 2010-02-20 NOTE — Assessment & Plan Note (Signed)
Summary: F2M/DM  Medications Added CRESTOR 20 MG TABS (ROSUVASTATIN CALCIUM) Take one tablet by mouth daily. ASPIRIN 81 MG TBEC (ASPIRIN) one tab by mouth once daily        History of Present Illness: Mr. Zuercher is a pleasant gentleman who has a history of hypertension, aortic insufficiency, and cerebrovascular disease.  He had his last echocardiogram in September 2010 showed normal LV function.  There was mild aortic insufficiency and mitral regurgitation. He also had carotid Dopplers last performed in Sept 2010.  He had a 40-59% right and 0- 39% left.  F/U recommended in one year. I last saw him in May of 2011 and he was complaining of chest pain. A cardiac catheterization was arranged and revealed severe three-vessel coronary artery disease and normal LV function. He subsequently underwent coronary artery bypass and graft in May of 2011(left internal mammary artery to left anterior   descending artery, saphenous vein graft to first diagonal, saphenous  vein graft obtuse marginal 1, sequential saphenous vein graft to distal  right coronary and posterior descending artery). Note an intraoperative transesophageal echocardiogram showed mild to moderate aortic insufficiency and a dilated aortic root. However this was felt not to require surgical intervention at that time. Since then he has had some depression but denies dyspnea, chest pain, palpitations or syncope. His strength is slowly improving.  Current Medications (verified): 1)  Glimepiride 2 Mg  Tabs (Glimepiride) .... 1/2 Tab Qd 2)  Fish Oil 1000mg  .... 1 Tab By Mouth Two Times A Day 3)  Folic Acid With Dha .... 2 Tabs By Mouth Once Daily 4)  Coreg 12.5 Mg  Tabs (Carvedilol) .Marland Kitchen.. 1 Two Times A Day Increase To 1 and 1/2  Two Times A Day If Bp > 145/85 Off Amlodipine 5)  Hydrochlorothiazide 12.5 Mg  Tabs (Hydrochlorothiazide) .Marland Kitchen.. 1 By Mouth Qd 6)  Crestor 40 Mg Tabs (Rosuvastatin Calcium) .... Take One Daily 7)  Metformin Hcl 500 Mg  Xr24h-Tab (Metformin Hcl) .Marland Kitchen.. 1 Two Times A Day With 2 Largest Meals 8)  Aspirin 81 Mg Tbec (Aspirin) .... 4 Tab By Mouth Once Daily 9)  Benazepril Hcl 40 Mg Tabs (Benazepril Hcl) .Marland Kitchen.. 1 By Mouth Once Daily  Allergies: 1)  ! Sulfa 2)  ! Zolpidem Tartrate (Zolpidem Tartrate) 3)  Celebrex 4)  Lipitor  Past History:  Past Medical History: hypertension Hyperlipidemia Diabetes mellitus, type II Aortic Valve  Insufficiency/ AR bladder cancer, Dr Marcello Fennel CAD  Past Surgical History: Rotator cuff repair 2009 bladder tumor, Surgery X 6, Dr Marcello Fennel lower back surgery colonoscopy  2002 & 2004  negative epidural steroids colonoscopy adenomatous polyps remotely CABG  Social History: Reviewed history from 04/13/2007 and no changes required. Former Smoker quit 1987 Alcohol use-no  Review of Systems       no fevers or chills, productive cough, hemoptysis, dysphasia, odynophagia, melena, hematochezia, dysuria, hematuria, rash, seizure activity, orthopnea, PND, pedal edema, claudication. Remaining systems are negative.   Vital Signs:  Patient profile:   76 year old male Height:      74 inches Weight:      225 pounds BMI:     28.99 Pulse rate:   64 / minute Resp:     14 per minute BP sitting:   118 / 57  (left arm)  Vitals Entered By: Kem Parkinson (July 28, 2009 12:06 PM)  Physical Exam  General:  Well-developed well-nourished in no acute distress.  Skin is warm and dry.  HEENT is normal.  Neck  is supple. No thyromegaly.  Chest is clear to auscultation with normal expansion. Sternotomy without evidence of infection. Cardiovascular exam is regular rate and rhythm.  Abdominal exam nontender or distended. No masses palpated. Extremities show no edema. neuro grossly intact    Impression & Recommendations:  Problem # 1:  CAD, ARTERY BYPASS GRAFT (ICD-414.04) Doing well postoperatively. Continue aspirin, ACE inhibitor, beta blocker and statin. His updated  medication list for this problem includes:    Coreg 12.5 Mg Tabs (Carvedilol) .Marland Kitchen... 1 two times a day increase to 1 and 1/2  two times a day if bp > 145/85 off amlodipine    Aspirin 81 Mg Tbec (Aspirin) ..... One tab by mouth once daily    Benazepril Hcl 40 Mg Tabs (Benazepril hcl) .Marland Kitchen... 1 by mouth once daily  Problem # 2:  CEREBROVASCULAR DISEASE (ICD-437.9) Continue aspirin and statin. Obtain preoperative carotid Dopplers from the hospital.  Problem # 3:  HYPERLIPIDEMIA (ICD-272.4) Patient complains of myalgias. Decrease Crestor to 20 mg p.o. daily. Check lipids and liver in 6 weeks. His updated medication list for this problem includes:    Crestor 20 Mg Tabs (Rosuvastatin calcium) .Marland Kitchen... Take one tablet by mouth daily.  Problem # 4:  HYPERTENSION, ESSENTIAL NOS (ICD-401.9) Blood pressure controlled on present medications. Will continue. The following medications were removed from the medication list:    Furosemide 40 Mg Tabs (Furosemide) .Marland Kitchen... Take one tablet by mouth daily. for 3 days only His updated medication list for this problem includes:    Coreg 12.5 Mg Tabs (Carvedilol) .Marland Kitchen... 1 two times a day increase to 1 and 1/2  two times a day if bp > 145/85 off amlodipine    Hydrochlorothiazide 12.5 Mg Tabs (Hydrochlorothiazide) .Marland Kitchen... 1 by mouth qd    Aspirin 81 Mg Tbec (Aspirin) ..... One tab by mouth once daily    Benazepril Hcl 40 Mg Tabs (Benazepril hcl) .Marland Kitchen... 1 by mouth once daily  Problem # 5:  AORTIC INSUFFICIENCY (ICD-424.1) Followup echocardiogram and CT of his aortic root in 6 months when he returns. The following medications were removed from the medication list:    Furosemide 40 Mg Tabs (Furosemide) .Marland Kitchen... Take one tablet by mouth daily. for 3 days only His updated medication list for this problem includes:    Coreg 12.5 Mg Tabs (Carvedilol) .Marland Kitchen... 1 two times a day increase to 1 and 1/2  two times a day if bp > 145/85 off amlodipine    Hydrochlorothiazide 12.5 Mg Tabs  (Hydrochlorothiazide) .Marland Kitchen... 1 by mouth qd    Benazepril Hcl 40 Mg Tabs (Benazepril hcl) .Marland Kitchen... 1 by mouth once daily  Patient Instructions: 1)  Your physician recommends that you schedule a follow-up appointment in: 6 MONTHS 2)  Your physician recommends that you return for a FASTING lipid profile: 6 WEEKS-WEEK OF AUGUST 15TH 3)  Your physician has recommended you make the following change in your medication:DECREASE CRESTOR 20MG  ONCE DAILY 4)  DECREASE ASPIRIN 81MG  ONE TABLET ONCE DAILY Prescriptions: COREG 12.5 MG  TABS (CARVEDILOL) 1 two times a day increase to 1 and 1/2  two times a day if BP > 145/85 off Amlodipine  #180 x 3   Entered by:   Deliah Goody, RN   Authorized by:   Ferman Hamming, MD, [facility]   Signed by:   Deliah Goody, RN on 07/28/2009   Method used:   Electronically to        [facility] (retail)  [address]       [place], [place]  [address]       Ph: [phone]       Fax: [phone]   RxID:   [id number] CRESTOR 20 MG TABS (ROSUVASTATIN CALCIUM) Take one tablet by mouth daily.  #30 x 12   Entered by:   Deliah Goody, RN   Authorized by:   Ferman Hamming, MD, [facility]   Signed by:   Deliah Goody, RN on 07/28/2009   Method used:   Electronically to        [facility] (retail)       [address]       [place], [place]  [address]       Ph: [phone]       Fax: [phone]   RxID:   [id number]

## 2010-02-20 NOTE — Progress Notes (Signed)
Summary: HANDICAP STICKER  Phone Note Call from Patient Call back at Home Phone [phone]   Caller: Patient Summary of Call: PT WOULD LIKE A HANDICAPP STICKER TO BE FILL OUT FOR HER HUSBAND AND LIKE A CALL WHEN TO COME AND PICK THIS UP WHEN READY Initial call taken by: Freddy Jaksch,  July 04, 2009 11:07 AM  Follow-up for Phone Call        Patient's wife aware Handicapp sticker is avaliable for pick-up. Patient's wife asked if I could remove Generic Ambien form med list, patient not taking cause he tried it x 2 and it made him feel wierd Follow-up by: Shonna Chock,  July 04, 2009 3:21 PM   New Allergies: ! ZOLPIDEM TARTRATE (ZOLPIDEM TARTRATE) New Allergies: ! ZOLPIDEM TARTRATE (ZOLPIDEM TARTRATE)

## 2010-02-20 NOTE — Assessment & Plan Note (Signed)
Summary: eph/dm  Medications Added CRESTOR 40 MG TABS (ROSUVASTATIN CALCIUM) take one daily ASPIR-TRIN 325 MG TBEC (ASPIRIN) take one daily FUROSEMIDE 40 MG TABS (FUROSEMIDE) Take one tablet by mouth daily. for 3 days only POTASSIUM CHLORIDE CRYS CR 20 MEQ CR-TABS (POTASSIUM CHLORIDE CRYS CR) Take one tablet by mouth daily for 3 days only        Visit Type:  Follow-up   History of Present Illness: is a 76 year old male patient who underwent CABG x5 Jun 08, 2009.  The patient main complaint is lack of sleep decreased, appetite, and wanting to exercise more. He's also developed some swelling since he stopped the 3 days of Lasix.He is somewhat anxious about his inability to sleep and this is causing a lot of stress in his life right now.  He denies chest pain palpitations dyspnea dyspnea on exertion dizziness or presyncope. His wife did fill his breathing was labored last night and he had a rough night sleeping and spent most of night walking around. His edema in his legs went down after 3 days of Lasix but once it ended the edema returned. He was on hydrochlorothiazide prior to hospitalization and this was not resumed.  Current Medications (verified): 1)  Glimepiride 2 Mg  Tabs (Glimepiride) .... 1/2 Tab Qd 2)  Fish Oil 1000mg  .... 1 Tab By Mouth Two Times A Day 3)  Folic Acid With Dha .... 2 Tabs By Mouth Once Daily 4)  Coreg 12.5 Mg  Tabs (Carvedilol) .Marland Kitchen.. 1 Two Times A Day Increase To 1 and 1/2  Two Times A Day If Bp > 145/85 Off Amlodipine 5)  Hydrochlorothiazide 12.5 Mg  Tabs (Hydrochlorothiazide) .Marland Kitchen.. 1 By Mouth Qd 6)  Crestor 40 Mg Tabs (Rosuvastatin Calcium) .... Take One Daily 7)  Metformin Hcl 500 Mg Xr24h-Tab (Metformin Hcl) .Marland Kitchen.. 1 Two Times A Day With 2 Largest Meals 8)  Aspir-Trin 325 Mg Tbec (Aspirin) .... Take One Daily  Allergies: 1)  ! Sulfa 2)  Celebrex 3)  Lipitor  Past History:  Past Medical History: Last updated:  06/02/2009 hypertension Hyperlipidemia Diabetes mellitus, type II Aortic Valve  Insufficiency/ AR bladder cancer, Dr Marcello Fennel  Past Surgical History: Last updated: 05/24/2009 Rotator cuff repair 2009 bladder tumor, Surgery X 6, Dr Marcello Fennel lower back surgery colonoscopy  2002 & 2004  negative epidural steroids colonoscopy adenomatous polyps remotely  Review of Systems       see history of present illness  Vital Signs:  Patient profile:   76 year old male Height:      74 inches Weight:      241 pounds Pulse rate:   76 / minute Pulse rhythm:   regular BP sitting:   112 / 70  (right arm)  Vitals Entered By: Jacquelin Hawking, CMA (Jun 20, 2009 11:04 AM)  Physical Exam  General:   Well-nournished, in no acute distress. Neck: No JVD, HJR, Bruit, or thyroid enlargement Lungs: decreased breath sounds,No tachypnea, clear without wheezing, rales, or rhonchi Cardiovascular: RRR, PMI not displaced, heart sounds normal, no murmurs, gallops, bruit, thrill, or heave. Abdomen: BS normal. Soft without organomegaly, masses, lesions or tenderness. Extremities: +1-2 leg edema bilaterally right greater than left,without cyanosis, clubbing . Good distal pulses bilateral SKin: Warm, no lesions or rashes  Musculoskeletal: No deformities Neuro: no focal signs    EKG  Procedure date:  06/20/2009  Findings:      normal sinus rhythm with incomplete right bundle branch block inferior Q waves T wave abnormality anteriorly  Impression & Recommendations:  Problem # 1:  CAD, ARTERY BYPASS GRAFT (ICD-414.04)  The following medications were removed from the medication list:patient had CABG x5 Jun 09, 1999 with a LIMA to the LAD, SVG to the first diagonal, SVG to the first OM, SVG to the distal RCA and PDA.His main complaints her insomnia and decreased appetite. I think once he's increases with activity he will sleep better and his appetite will improve. I've recommended cardiac rehabilitation. I  asked him to speak with Dr. Alwyn Ren concerning sleep aids and trouble with his increased glucose. Exercise should also improve this area.    Benazepril Hcl 40 Mg Tabs (Benazepril hcl) .Marland Kitchen... 1 by mouth once daily His updated medication list for this problem includes:    Coreg 12.5 Mg Tabs (Carvedilol) .Marland Kitchen... 1 two times a day increase to 1 and 1/2  two times a day if bp > 145/85 off amlodipine    Aspir-trin 325 Mg Tbec (Aspirin) .Marland Kitchen... Take one daily  Problem # 2:  EDEMA- LOCALIZED (ICD-782.3) Patient does have increased edema. I will give him 3 days of Lasix 40 mg once daily as well as K. Dur 20 mEq once daily for 3 days. When he finishes this he should resume hydrochlorothiazide 12.5 mg once daily. He may need a higher dose of this. I asked him to call our office if his edema it returns after always changes.  Problem # 3:  HYPERTENSION, ESSENTIAL NOS (ICD-401.9) Patient's blood pressure is normal today but his wife has a list of blood pressures from home and may are in the 148/69 range. He was on Avapro prior to hospitalize patient and I will resume this. The following medications were removed from the medication list:    Benazepril Hcl 40 Mg Tabs (Benazepril hcl) .Marland Kitchen... 1 by mouth once daily His updated medication list for this problem includes:    Coreg 12.5 Mg Tabs (Carvedilol) .Marland Kitchen... 1 two times a day increase to 1 and 1/2  two times a day if bp > 145/85 off amlodipine    Hydrochlorothiazide 12.5 Mg Tabs (Hydrochlorothiazide) .Marland Kitchen... 1 by mouth qd    Aspir-trin 325 Mg Tbec (Aspirin) .Marland Kitchen... Take one daily    Furosemide 40 Mg Tabs (Furosemide) .Marland Kitchen... Take one tablet by mouth daily. for 3 days only  Other Orders: EKG w/ Interpretation (93000)  Patient Instructions: 1)  Your physician recommends that you schedule a follow-up appointment in:  2  months  with Dr [name] Re-start Benazepril. Start Cardiac rehab. 2)  Your physician has recommended you make the following change in your medication:  Furosemide 40 mg take one tablet by mouth once a day for 3 days only and [place] 20 MEQ take one tablet by mouth once a day for 3 days only.Then restart HCTZ 12.5 mg once a day. 3)  Call you PCP Dr. Alwyn Ren for you Sleep medication and high glucose medication. Prescriptions: POTASSIUM CHLORIDE CRYS CR 20 MEQ CR-TABS (POTASSIUM CHLORIDE CRYS CR) Take one tablet by mouth daily for 3 days only  #3 x 0   Entered by:   Ollen Gross, RN, BSN   Authorized by:   Marletta Lor, PA-C   Signed by:   Ollen Gross, RN, BSN on 06/20/2009   Method used:   Electronically to        [facility] (retail)       [address]       [place], [place]  [address]       Ph: [phone]       Fax: [phone]   RxID:   [id number] FUROSEMIDE 40 MG TABS (FUROSEMIDE) Take one tablet by mouth daily. for 3 days only  #3 x 0   Entered by:   Ollen Gross, RN, BSN   Authorized by:   Marletta Lor, PA-C   Signed by:   Ollen Gross, RN, BSN on 06/20/2009   Method used:   Electronically to        [facility] (retail)       [address]       [place], [place]  [address]       Ph: [phone]       Fax: [phone]   RxID:   [id number]

## 2010-02-20 NOTE — Progress Notes (Signed)
Summary: pt has edema and nausea   Phone Note Call from Patient Call back at Home Phone [phone]   Caller: Spouse Reason for Call: Talk to Nurse, Talk to Doctor Summary of Call: pt has cabg in april and his legs and foot still has edema and and yesterday had some nausea and wife is concerned Initial call taken by: Omer Jack,  July 03, 2009 9:43 AM  Follow-up for Phone Call        Spoke with Herma Carson, PA-C and she said she could see the pt today.  She also said to have the pt start Lasix 40mg  daily and Potassium daily, and stop the HCTZ.  She suggested an Echo as well. When speaking with the pt's wife, she said that they were not comfortable with Marcelino Duster and would rather see Dr. [name].  They have an appt. on 07/26/09 but they want to be seen earlier.  Dr. [name] doesn't have anything earlier but I will speak with Stanton Kidney to see if she thinks they can be put on the schedule somewhere. In the meantime, I tried to offer her the lasix and potassium that Marcelino Duster said was OK to order for the pt & the wife didn't want anything to do with Michelle's recommendations.  The pt has an appt with the CT surgeon tomorrow, 6/14, & I told the wife to make sure that they tell him about the pt's symptoms.  She verbalized understanding. Follow-up by: Minerva Areola, RN, BSN,  July 03, 2009 10:14 AM  Additional Follow-up for Phone Call Additional follow up Details #1::        Left message to call back Deliah Goody, RN  July 03, 2009 12:54 PM  wife is returning call, [phone], Migdalia Dk  July 03, 2009 2:54 PM   spoke with pts wife, pt was seen by the surgeon's pa today. they did a chest xray and he cont to have some fluid on his lungs. they started him on furosemide and potassium for the next 7 days. he has an appt to see dr Dorris Fetch next wednesday for follow up. dr [name] aware of the above and pt will keep scheduled appt on july 8th Deliah Goody, RN  July 04, 2009 5:46  PM

## 2010-02-20 NOTE — Consult Note (Signed)
Summary: [facility]  [facility]   Imported By: Lanelle Bal 06/20/2009 13:53:29  _____________________________________________________________________  External Attachment:    Type:   Image     Comment:   External Document

## 2010-02-20 NOTE — Miscellaneous (Signed)
Summary: [facility] Progress Note   [facility]   Imported By: Roderic Ovens 08/04/2009 11:13:17  _____________________________________________________________________  External Attachment:    Type:   Image     Comment:   External Document

## 2010-02-20 NOTE — Progress Notes (Signed)
Summary: med refill  Phone Note Refill Request Message from:  Pharmacy on September 07, 2009 1:55 PM  Refills Requested: Medication #1:  Amoxicillin Pharm. called stating that patient has requested Amoxicillin refill due to needing prior to dental appts. Please advise.  Initial call taken by: Lucious Groves CMA,  September 07, 2009 1:56 PM  Follow-up for Phone Call        [facility] to [facility] Follow-up by: Marga Melnick MD,  September 07, 2009 4:50 PM  Additional Follow-up for Phone Call Additional follow up Details #1::        Patient notified. Additional Follow-up by: Lucious Groves CMA,  September 08, 2009 2:32 PM    New/Updated Medications: AMOXICILLIN 500 MG CAPS (AMOXICILLIN) 4 pills 1 hr pre dental work Prescriptions: AMOXICILLIN 500 MG CAPS (AMOXICILLIN) 4 pills 1 hr pre dental work  #20 x prn X 1 yr   Entered and Authorized by:   Marga Melnick MD   Signed by:   Marga Melnick MD on 09/07/2009   Method used:   Faxed to ...       [facility] (retail)       [address]       [place], [place]  [address]       Ph: [phone]       Fax: [phone]   RxID:   [id number]

## 2010-02-20 NOTE — Miscellaneous (Signed)
  Clinical Lists Changes  Observations: Added new observation of CXR RESULTS: Interval retraction of Swan-Ganz catheter.  Interval   removal of mediastinal drain and left chest tube.  Stable   mediastinum and heart silhouette.  There is a small left effusion   and basilar atelectasis which is slightly improved compared to   prior.  No evidence of pneumothorax.    IMPRESSION:    1.  No evidence of pneumothorax following chest tube removal.   2.  Improved aeration to the left lung base.  (06/10/2009 14:38) Added new observation of CARDCATHFIND: 1. Left main stenosis.   2. Triple-vessel coronary artery disease.   3. Normal left ventricular systolic function.      RECOMMENDATIONS:  This patient has triple-vessel coronary artery disease   involving the ostium of the left main artery.  He also has normal left   ventricular systolic function.  He appears to be a good candidate for   surgical revascularization.  We will place consultation to   Cardiothoracic Surgery to discuss possible surgical revascularization   with coronary artery bypass grafting.  The patient will not be given   Plavix.  His home medications will be resumed.  He will be admitted to a   telemetry bed for monitoring.        (06/07/2009 14:37)      Cardiac Cath  Procedure date:  06/07/2009  Findings:      1. Left main stenosis.   2. Triple-vessel coronary artery disease.   3. Normal left ventricular systolic function.      RECOMMENDATIONS:  This patient has triple-vessel coronary artery disease   involving the ostium of the left main artery.  He also has normal left   ventricular systolic function.  He appears to be a good candidate for   surgical revascularization.  We will place consultation to   Cardiothoracic Surgery to discuss possible surgical revascularization   with coronary artery bypass grafting.  The patient will not be given   Plavix.  His home medications will be resumed.  He will be admitted to  a   telemetry bed for monitoring.         CXR  Procedure date:  06/10/2009  Findings:      Interval retraction of Swan-Ganz catheter.  Interval   removal of mediastinal drain and left chest tube.  Stable   mediastinum and heart silhouette.  There is a small left effusion   and basilar atelectasis which is slightly improved compared to   prior.  No evidence of pneumothorax.    IMPRESSION:    1.  No evidence of pneumothorax following chest tube removal.   2.  Improved aeration to the left lung base.

## 2010-02-20 NOTE — Letter (Signed)
Summary: [facility]  [facility]   Imported By: Lanelle Bal 11/01/2009 13:52:25  _____________________________________________________________________  External Attachment:    Type:   Image     Comment:   External Document

## 2010-02-20 NOTE — Assessment & Plan Note (Signed)
Summary: FOLLOWUP ON MED , FLU SHOT///SPH   Vital Signs:  Patient profile:   76 year old male Weight:      229.6 pounds BMI:     29.59 Temp:     97.7 degrees F oral Pulse rate:   60 / minute Resp:     17 per minute BP sitting:   130 / 60  (left arm) Cuff size:   large  Vitals Entered By: Shonna Chock CMA (November 08, 2009 9:12 AM) CC: Follow-up visit: discuss labs (copy given) , Type 2 diabetes mellitus follow-up   CC:  Follow-up visit: discuss labs (copy given)  and Type 2 diabetes mellitus follow-up.  History of Present Illness: Type 2 Diabetes Mellitus Follow-Up      This is a 76 year old man who presents for Type 2 diabetes mellitus follow-up.  The patient denies polyuria, polydipsia, blurred vision, self managed hypoglycemia, weight loss, weight gain, and numbness of extremities.  The patient denies the following symptoms: neuropathic pain, chest pain, vomiting, orthostatic symptoms, poor wound healing, intermittent claudication, vision loss, and foot ulcer.  Since the last visit the patient reports good dietary compliance and exercising regularly.  The patient has been measuring capillary blood glucose before breakfast, 109-127.  Since the last visit, the patient reports having had no eye care and no foot care.  A1c 6.8% (average sugar 148, risk 36% discussed)  Allergies: 1)  ! Sulfa 2)  ! Zolpidem Tartrate (Zolpidem Tartrate) 3)  Celebrex 4)  Lipitor  Physical Exam  General:  well-nourished; alert,appropriate and cooperative throughout examination Lungs:  Normal respiratory effort, chest expands symmetrically. Lungs are clear to auscultation, no crackles or wheezes. Heart:  normal rate, regular rhythm, no gallop, no rub, no JVD, and grade 1 /6 systolic& diastolic  murmurs  R base with R carotid bruit Pulses:  R and L carotid,radial,dorsalis pedis and posterior tibial pulses are full and equal bilaterally Extremities:  No clubbing, cyanosis, edema. Good nail  health Neurologic:  alert & oriented X3 and sensation intact to light touch oveer feet.   Skin:  Intact without suspicious lesions or rashes Psych:  memory intact for recent and remote, normally interactive, and good eye contact.     Impression & Recommendations:  Problem # 1:  DIAB W/O COMP TYPE II/UNS NOT STATED UNCNTRL (ICD-250.00)  The following medications were removed from the medication list:    Glimepiride 2 Mg Tabs (Glimepiride) .Marland Kitchen... 1/2 tab qd    Metformin Hcl 500 Mg Xr24h-tab (Metformin hcl) .Marland Kitchen... 1 two times a day with 2 largest meals **appointment due** His updated medication list for this problem includes:    Aspirin 81 Mg Tbec (Aspirin) ..... One tab by mouth once daily    Benazepril Hcl 40 Mg Tabs (Benazepril hcl) .Marland Kitchen... 1 by mouth once daily    Janumet 50-500 Mg Tabs (Sitagliptin-metformin hcl) .Marland Kitchen... 1 two times a day with a meal  Problem # 2:  HYPERTENSION, ESSENTIAL NOS (ICD-401.9) controlled His updated medication list for this problem includes:    Coreg 12.5 Mg Tabs (Carvedilol) .Marland Kitchen... 1 two times a day increase to 1 and 1/2  two times a day if bp > 145/85 off amlodipine    Hydrochlorothiazide 12.5 Mg Tabs (Hydrochlorothiazide) .Marland Kitchen... 1 by mouth qd    Benazepril Hcl 40 Mg Tabs (Benazepril hcl) .Marland Kitchen... 1 by mouth once daily  Complete Medication List: 1)  Fish Oil 1000mg   .... 1 tab by mouth two times a day 2)  Folic  Acid With Dha  .... 2 tabs by mouth once daily 3)  Coreg 12.5 Mg Tabs (Carvedilol) .Marland Kitchen.. 1 two times a day increase to 1 and 1/2  two times a day if bp > 145/85 off amlodipine 4)  Hydrochlorothiazide 12.5 Mg Tabs (Hydrochlorothiazide) .Marland Kitchen.. 1 by mouth qd 5)  Crestor 20 Mg Tabs (Rosuvastatin calcium) .... Take one tablet by mouth daily. 6)  Aspirin 81 Mg Tbec (Aspirin) .... One tab by mouth once daily 7)  Benazepril Hcl 40 Mg Tabs (Benazepril hcl) .Marland Kitchen.. 1 by mouth once daily 8)  Amoxicillin 500 Mg Caps (Amoxicillin) .... 4 pills 1 hr pre dental work 9)   Janumet 50-500 Mg Tabs (Sitagliptin-metformin hcl) .Marland Kitchen.. 1 two times a day with a meal  Other Orders: Flu Vaccine 51yrs + MEDICARE PATIENTS (O1308) Administration Flu vaccine - MCR (M5784)  Patient Instructions: 1)  Please schedule a follow-up appointment in 3 months. 2)  BUN,creat , K+ prior to visit, ICD-9:401.9 3)  HbgA1C prior to visit, ICD-9:250.00. 4)  Check your blood sugars regularly. If your readings are usually above :  150 or below 90 you should contact our office. 5)  See your eye doctor yearly to check for diabetic eye damage. 6)  Check your feet each night for sore areas, calluses or signs of infection. Prescriptions: JANUMET 50-500 MG TABS (SITAGLIPTIN-METFORMIN HCL) 1 two times a day with a meal  #180 x 1   Entered and Authorized by:   Marga Melnick MD   Signed by:   Marga Melnick MD on 11/08/2009   Method used:   Print then Give to Patient   RxID:   [id number]  Flu Vaccine Consent Questions     Do you have a history of severe allergic reactions to this vaccine? no    Any prior history of allergic reactions to egg and/or gelatin? no    Do you have a sensitivity to the preservative Thimersol? no    Do you have a past history of Guillan-Barre Syndrome? no    Do you currently have an acute febrile illness? no    Have you ever had a severe reaction to latex? no    Vaccine information given and explained to patient? yes    Are you currently pregnant? no    Lot Number:[id number]   Exp Date:07/21/2010   Site Given Right Deltoid IM   Orders Added: 1)  Flu Vaccine 33yrs + MEDICARE PATIENTS [Q2039] 2)  Administration Flu vaccine - MCR [G0008] 3)  Est. Patient Level IV [02725]   .lbmedflu

## 2010-02-20 NOTE — Miscellaneous (Signed)
Summary: [facility]   [facility]   Imported By: Roderic Ovens 08/30/2009 12:34:33  _____________________________________________________________________  External Attachment:    Type:   Image     Comment:   External Document

## 2010-02-20 NOTE — Letter (Signed)
Summary: [facility]  [facility]   Imported By: Lanelle Bal 02/17/2009 11:57:20  _____________________________________________________________________  External Attachment:    Type:   Image     Comment:   External Document

## 2010-02-20 NOTE — Assessment & Plan Note (Signed)
Summary: unable to sleep since having surgery/cbs   Vital Signs:  Patient profile:   76 year old male Weight:      238.4 pounds Pulse rate:   72 / minute Resp:     15 per minute BP sitting:   130 / 64  (left arm) Cuff size:   large  Vitals Entered By: Shonna Chock (June 28, 2009 4:05 PM) CC: Sleep concerns, Insomnia Comments REVIEWED MED LIST, PATIENT AGREED DOSE AND INSTRUCTION CORRECT    CC:  Sleep concerns and Insomnia.  History of Present Illness:  Insomnia      This is a 76 year old man who presents with Insomnia present 3 weeks since D/C from hospital post 5 vessel CBAG .  The patient reports early awakening about 12:30 am to urinate. He has  daytime somnolence, but denies difficulty falling asleep, frequent awakening, nightmares, leg movements, snoring, and apnea noted by partner.  Associated symptoms include  some depression. No racing of mind after returning to bed. Insomnia has led to problems with memory loss.  Behaviors that may contribute to insomnia include daytime naps.  Past treatments that have been effective include behavior modification.    Allergies: 1)  ! Sulfa 2)  Celebrex 3)  Lipitor  Review of Systems CV:  Complains of swelling of feet; denies chest pain or discomfort, difficulty breathing at night, difficulty breathing while lying down, palpitations, and shortness of breath with exertion; Persistent edema RLE. Psych:  Denies anxiety, easily angered, easily tearful, irritability, and panic attacks.  Physical Exam  General:  in no acute distress;  flat affect but  cooperative throughout examination Lungs:  Normal respiratory effort, chest expands symmetrically. Lungs are clear to auscultation, no crackles or wheezes. Heart:  Normal rate and regular rhythm. S1 and S2 normal without gallop, click, rub  AS/AI murmur suggested Pulses:  R and L carotid,radial  pulses are full and equal bilaterally. Decreased pedal pulses due to edema Extremities:  1+ left  pedal edema and trace right pedal edema.  Neg Homan's Psych:  Oriented X3, depressed affect, flat affect, and subdued.     Impression & Recommendations:  Problem # 1:  SLEEP DISORDER (ICD-780.50)  Problem # 2:  CAD, ARTERY BYPASS GRAFT (ICD-414.04)  His updated medication list for this problem includes:    Coreg 12.5 Mg Tabs (Carvedilol) .Marland Kitchen... 1 two times a day increase to 1 and 1/2  two times a day if bp > 145/85 off amlodipine    Hydrochlorothiazide 12.5 Mg Tabs (Hydrochlorothiazide) .Marland Kitchen... 1 by mouth qd    Aspir-trin 325 Mg Tbec (Aspirin) .Marland Kitchen... Take one daily    Furosemide 40 Mg Tabs (Furosemide) .Marland Kitchen... Take one tablet by mouth daily. for 3 days only    Benazepril Hcl 40 Mg Tabs (Benazepril hcl) .Marland Kitchen... 1 by mouth once daily  Problem # 3:  EDEMA- LOCALIZED (ICD-782.3)  His updated medication list for this problem includes:    Hydrochlorothiazide 12.5 Mg Tabs (Hydrochlorothiazide) .Marland Kitchen... 1 by mouth qd    Furosemide 40 Mg Tabs (Furosemide) .Marland Kitchen... Take one tablet by mouth daily. for 3 days only  Complete Medication List: 1)  Glimepiride 2 Mg Tabs (Glimepiride) .... 1/2 tab qd 2)  Fish Oil 1000mg   .... 1 tab by mouth two times a day 3)  Folic Acid With Dha  .... 2 tabs by mouth once daily 4)  Coreg 12.5 Mg Tabs (Carvedilol) .Marland Kitchen.. 1 two times a day increase to 1 and 1/2  two times  a day if bp > 145/85 off amlodipine 5)  Hydrochlorothiazide 12.5 Mg Tabs (Hydrochlorothiazide) .Marland Kitchen.. 1 by mouth qd 6)  Crestor 40 Mg Tabs (Rosuvastatin calcium) .... Take one daily 7)  Metformin Hcl 500 Mg Xr24h-tab (Metformin hcl) .Marland Kitchen.. 1 two times a day with 2 largest meals 8)  Aspir-trin 325 Mg Tbec (Aspirin) .... Take one daily 9)  Furosemide 40 Mg Tabs (Furosemide) .... Take one tablet by mouth daily. for 3 days only 10)  Potassium Chloride Crys Cr 20 Meq Cr-tabs (Potassium chloride crys cr) .... Take one tablet by mouth daily for 3 days only 11)  Benazepril Hcl 40 Mg Tabs (Benazepril hcl) .Marland Kitchen.. 1 by mouth once  daily 12)  Zolpidem Tartrate 5 Mg Tabs (Zolpidem tartrate) .Marland Kitchen.. 1 at bedtime every 3rd night as needed 13)  Fluoxetine Hcl 10 Mg Caps (Fluoxetine hcl) .Marland Kitchen.. 1 once daily  Patient Instructions: 1)  Consider the Fluoxetine 10 mg once daily to address Neurotransmitter deficiency as discussed. Prescriptions: FLUOXETINE HCL 10 MG CAPS (FLUOXETINE HCL) 1 once daily  #30 x 2   Entered and Authorized by:   Marga Melnick MD   Signed by:   Marga Melnick MD on 06/28/2009   Method used:   Print then Give to Patient   RxID:   [id number] ZOLPIDEM TARTRATE 5 MG TABS (ZOLPIDEM TARTRATE) 1 at bedtime every 3rd night as needed  #10 x 0   Entered and Authorized by:   Marga Melnick MD   Signed by:   Marga Melnick MD on 06/28/2009   Method used:   Print then Give to Patient   RxID:   [id number]

## 2010-02-20 NOTE — Letter (Signed)
Summary: [facility]  [facility]   Imported By: Lanelle Bal 07/19/2009 09:50:41  _____________________________________________________________________  External Attachment:    Type:   Image     Comment:   External Document

## 2010-02-20 NOTE — Cardiovascular Report (Signed)
Summary: Pre Cath Orders  Pre Cath Orders   Imported By: Roderic Ovens 06/20/2009 10:07:10  _____________________________________________________________________  External Attachment:    Type:   Image     Comment:   External Document

## 2010-02-20 NOTE — Assessment & Plan Note (Signed)
Summary: rov/chest pain  Medications Added * FISH OIL 1000MG  1 tab by mouth two times a day * FOLIC ACID WITH DHA 2 tabs by mouth once daily COREG 12.5 MG  TABS (CARVEDILOL) 1 two times a day increase to 1 and 1/2  two times a day if BP > 145/85 off Amlodipine        History of Present Illness: [name] is a pleasant gentleman who has a history of hypertension, aortic insufficiency, and cerebrovascular disease.  He had his last echocardiogram in September 2010 the.  At that time, he had normal LV function.  There was mild aortic insufficiency and mitral regurgitation. He also had carotid Dopplers last performed in Sept 2010.  He had a 40-59% right and 0- 39% left.  F/U recommended in one year. Since I last saw in February of 2010, he has developed a "discomfort" in his chest. This occurs with walking. If he continues to walk it does improve. He does not radiate. There is no associated symptoms. They do not occur at rest. His last episode was approximately 2 weeks ago. He denies any orthopnea, PND, pedal edema, claudication or syncope.  Current Medications (verified): 1)  Glimepiride 2 Mg  Tabs (Glimepiride) .... 1/2 Tab Qd 2)  Fish Oil 1000mg  .... 1 Tab By Mouth Two Times A Day 3)  Folic Acid With Dha .... 2 Tabs By Mouth Once Daily 4)  Coreg 12.5 Mg  Tabs (Carvedilol) .Marland Kitchen.. 1 Two Times A Day Increase To 1 and 1/2  Two Times A Day If Bp > 145/85 Off Amlodipine 5)  Hydrochlorothiazide 12.5 Mg  Tabs (Hydrochlorothiazide) .Marland Kitchen.. 1 By Mouth Qd 6)  Crestor 10 Mg  Tabs (Rosuvastatin Calcium) .Marland Kitchen.. 1 By Mouth Once Daily 7)  Metformin Hcl 500 Mg Xr24h-Tab (Metformin Hcl) .Marland Kitchen.. 1 Two Times A Day With 2 Largest Meals 8)  Adult Aspirin Low Strength 81 Mg Tbdp (Aspirin) .Marland Kitchen.. 1 By Mouth Once Daily 9)  Benazepril Hcl 40 Mg Tabs (Benazepril Hcl) .Marland Kitchen.. 1 By Mouth Once Daily  Allergies: 1)  ! Sulfa 2)  Celebrex 3)  Lipitor  Past History:  Past Medical History: hypertension Hyperlipidemia Diabetes  mellitus, type II Aortic Valve  Insufficiency/ AR bladder cancer, Dr Marcello Fennel  Past Surgical History: Reviewed history from 05/24/2009 and no changes required. Rotator cuff repair 2009 bladder tumor, Surgery X 6, Dr Marcello Fennel lower back surgery colonoscopy  2002 & 2004  negative epidural steroids colonoscopy adenomatous polyps remotely  Social History: Reviewed history from 04/13/2007 and no changes required. Former Smoker quit 1987 Alcohol use-no  Review of Systems       Cough productive of phlegm but no fevers or chills,  hemoptysis, dysphasia, odynophagia, melena, hematochezia, dysuria, hematuria, rash, seizure activity, orthopnea, PND, pedal edema, claudication. Remaining systems are negative.   Vital Signs:  Patient profile:   76 year old male Height:      74 inches Weight:      243 pounds BMI:     31.31 Pulse rate:   64 / minute Resp:     14 per minute BP sitting:   160 / 80  (left arm)  Vitals Entered By: Kem Parkinson (Jun 02, 2009 2:44 PM)  Physical Exam  General:  Well-developed well-nourished in no acute distress.  Skin is warm and dry.  HEENT is normal.  Neck is supple. No thyromegaly.  Chest is clear to auscultation with normal expansion.  Cardiovascular exam is regular rate and rhythm. 2/6 systolic and diastolic  murmur at left sternal border. Abdominal exam nontender or distended. No masses palpated. Extremities show no edema. neuro grossly intact    EKG  Procedure date:  05/24/2009  Findings:      Sinus bradycardia with first degree AV block. No significant ST changes.  Impression & Recommendations:  Problem # 1:  ANGINA DECUBITUS (ICD-413.0) Pt has developed symptoms concerning for new onset angina. He has multiple risk factors including diabetes, hypertension, hyperlipidemia and prior tobacco use. I feel the best option would be to proceed with cardiac catheterization for definitive evaluation. The risks and benefits have been discussed  and he agrees to proceed. Continue aspirin, beta blocker and statin. His updated medication list for this problem includes:    Coreg 12.5 Mg Tabs (Carvedilol) .Marland Kitchen... 1 two times a day increase to 1 and 1/2  two times a day if bp > 145/85 off amlodipine    Adult Aspirin Low Strength 81 Mg Tbdp (Aspirin) .Marland Kitchen... 1 by mouth once daily    Benazepril Hcl 40 Mg Tabs (Benazepril hcl) .Marland Kitchen... 1 by mouth once daily  His updated medication list for this problem includes:    Coreg 12.5 Mg Tabs (Carvedilol) .Marland Kitchen... 1 two times a day increase to 1 and 1/2  two times a day if bp > 145/85 off amlodipine    Adult Aspirin Low Strength 81 Mg Tbdp (Aspirin) .Marland Kitchen... 1 by mouth once daily    Benazepril Hcl 40 Mg Tabs (Benazepril hcl) .Marland Kitchen... 1 by mouth once daily  Problem # 2:  HYPERLIPIDEMIA (ICD-272.4)  Continue statin. His updated medication list for this problem includes:    Crestor 10 Mg Tabs (Rosuvastatin calcium) .Marland Kitchen... 1 by mouth once daily  His updated medication list for this problem includes:    Crestor 10 Mg Tabs (Rosuvastatin calcium) .Marland Kitchen... 1 by mouth once daily  Problem # 3:  AORTIC INSUFFICIENCY (ICD-424.1) Mild on most recent echocardiogram. His updated medication list for this problem includes:    Coreg 12.5 Mg Tabs (Carvedilol) .Marland Kitchen... 1 two times a day increase to 1 and 1/2  two times a day if bp > 145/85 off amlodipine    Hydrochlorothiazide 12.5 Mg Tabs (Hydrochlorothiazide) .Marland Kitchen... 1 by mouth qd    Benazepril Hcl 40 Mg Tabs (Benazepril hcl) .Marland Kitchen... 1 by mouth once daily  His updated medication list for this problem includes:    Coreg 12.5 Mg Tabs (Carvedilol) .Marland Kitchen... 1 two times a day increase to 1 and 1/2  two times a day if bp > 145/85 off amlodipine    Hydrochlorothiazide 12.5 Mg Tabs (Hydrochlorothiazide) .Marland Kitchen... 1 by mouth qd    Benazepril Hcl 40 Mg Tabs (Benazepril hcl) .Marland Kitchen... 1 by mouth once daily  Problem # 4:  HYPERTENSION, ESSENTIAL NOS (ICD-401.9) Continue present medications. Blood pressure  elevated but he states it runs normal at home. We will follow this and add medications as needed. His updated medication list for this problem includes:    Coreg 12.5 Mg Tabs (Carvedilol) .Marland Kitchen... 1 two times a day increase to 1 and 1/2  two times a day if bp > 145/85 off amlodipine    Hydrochlorothiazide 12.5 Mg Tabs (Hydrochlorothiazide) .Marland Kitchen... 1 by mouth qd    Adult Aspirin Low Strength 81 Mg Tbdp (Aspirin) .Marland Kitchen... 1 by mouth once daily    Benazepril Hcl 40 Mg Tabs (Benazepril hcl) .Marland Kitchen... 1 by mouth once daily  Problem # 5:  CEREBROVASCULAR DISEASE (ICD-437.9) He will need followup carotid Dopplers in September of 2011. Continue aspirin  and statin.  Other Orders: Cardiac Catheterization (Cardiac Cath) T-Basic Metabolic Panel [phone] [id number]) T-CBC No Diff ([id number]) T-Protime, Auto ([id number])  Patient Instructions: 1)  Your physician recommends that you schedule a follow-up appointment in: 8 WEEKS 2)  Your physician has requested that you have a cardiac catheterization.  Cardiac catheterization is used to diagnose and/or treat various heart conditions. Doctors may recommend this procedure for a number of different reasons. The most common reason is to evaluate chest pain. Chest pain can be a symptom of coronary artery disease (CAD), and cardiac catheterization can show whether plaque is narrowing or blocking your heart's arteries. This procedure is also used to evaluate the valves, as well as measure the blood flow and oxygen levels in different parts of your heart.  For further information please visit [email or web address]  Please follow instruction sheet, as given.

## 2010-02-22 NOTE — Miscellaneous (Signed)
Summary: Appointment Canceled  Appointment status changed to canceled by [employer] on 01/03/2010 10:16 AM.  Cancellation Comments --------------------- echo dx 424.1/[facility]  Appointment Information ----------------------- Appt Type:  CARDIOLOGY ANCILLARY VISIT      Date:  Thursday, January 11, 2010      Time:  11:30 AM for 60 min   Urgency:  Routine   Made By:  Hoy Finlay Scheduler  To Visit:  [id number]    Reason:  echo dx 424.1/[facility]  Appt Comments ------------- -- 01/03/10 10:16: ([facility]) CANCELED -- echo dx 424.1/[facility] -- 01/03/10 10:15: ([facility]) BOOKED -- Routine CARDIOLOGY ANCILLARY VISIT at 01/11/2010 11:30 AM for 60 min echo dx 424.1/[facility]

## 2010-02-22 NOTE — Assessment & Plan Note (Signed)
Summary: 3 MONTH FOLLOWUP///SPH   Vital Signs:  Patient profile:   76 year old male Weight:      239 pounds BMI:     30.80 Pulse rate:   60 / minute Resp:     12 per minute BP sitting:   132 / 70  (left arm) Cuff size:   large  Vitals Entered By: Shonna Chock CMA (February 14, 2010 9:40 AM) CC: 3 month follow-up: discuss labs (copy given), Type 2 diabetes mellitus follow-up   CC:  3 month follow-up: discuss labs (copy given) and Type 2 diabetes mellitus follow-up.  History of Present Illness:      This is a 76 year old man who presents for Type 2 diabetes mellitus follow-up.  The patient denies polyuria, polydipsia, blurred vision, hypoglycemia and numbness of extremities.  The patient denies the following symptoms: neuropathic pain, chest pain, vomiting, orthostatic symptoms, poor wound healing, intermittent claudication, vision loss, and foot ulcer.  Since the last visit the patient reports  fair  dietary compliance and exercising regularly.  The patient has been measuring capillary blood glucose before breakfast, average  FBS 113 X past month.  Since the last visit, the patient reports having had no eye care.  Janumet costs $260.80.  Allergies: 1)  ! Sulfa 2)  ! Zolpidem Tartrate (Zolpidem Tartrate) 3)  Celebrex 4)  Lipitor  Physical Exam  General:  well-nourished';alert,appropriate and cooperative throughout examination Heart:  AS/AI murmur with L > R carotid bruit Pulses:  R and L carotid,radial pulses are full and equal bilaterally. Decreased pedal pulses Extremities:  No clubbing, cyanosis, edema. Neurologic:  alert & oriented X3 and sensation intact to light touch over feet.   Skin:  Intact without suspicious lesions or rashes   Impression & Recommendations:  Problem # 1:  DIAB W/O COMP TYPE II/UNS NOT STATED UNCNTRL (ICD-250.00)  cost of Janumet exhorbitant  His updated medication list for this problem includes:    Aspirin 81 Mg Tbec (Aspirin) ..... One tab by  mouth once daily    Benazepril Hcl 40 Mg Tabs (Benazepril hcl) .Marland Kitchen... 1 by mouth once daily    Janumet 50-500 Mg Tabs (Sitagliptin-metformin hcl) .Marland Kitchen... 1 two times a day with a meal. hold; cost = @60 .80    Metformin Hcl 850 Mg Tabs (Metformin hcl) .Marland Kitchen... 1 two times a day with 2 largest meals  Orders: Prescription Created Electronically [phone])  Complete Medication List: 1)  Fish Oil 1000mg   .... 1 tab by mouth two times a day 2)  Folic Acid With Dha  .... 2 tabs by mouth once daily 3)  Coreg 12.5 Mg Tabs (Carvedilol) .Marland Kitchen.. 1 two times a day increase to 1 and 1/2  two times a day if bp > 145/85 off amlodipine 4)  Hydrochlorothiazide 12.5 Mg Tabs (Hydrochlorothiazide) .Marland Kitchen.. 1 by mouth qd 5)  Crestor 20 Mg Tabs (Rosuvastatin calcium) .... Take one tablet by mouth daily. 6)  Aspirin 81 Mg Tbec (Aspirin) .... One tab by mouth once daily 7)  Benazepril Hcl 40 Mg Tabs (Benazepril hcl) .Marland Kitchen.. 1 by mouth once daily 8)  Amoxicillin 500 Mg Caps (Amoxicillin) .... 4 pills 1 hr pre dental work 9)  Janumet 50-500 Mg Tabs (Sitagliptin-metformin hcl) .Marland Kitchen.. 1 two times a day with a meal. hold; cost = @60 .80 10)  Metformin Hcl 850 Mg Tabs (Metformin hcl) .Marland Kitchen.. 1 two times a day with 2 largest meals  Patient Instructions: 1)  Please schedule a follow-up appointment in 3  months. 2)  Check your blood sugars regularly. If your readings are usually above : 150 or below 90 you should contact our office. 3)  See your eye doctor yearly to check for diabetic eye damage. 4)  Check your feet each night for sore areas, calluses or signs of infection. 5)  BUn,creat, K+  prior to visit, ICD-9:995.20 6)  HbgA1C prior to visit, ICD-9:250.00 Prescriptions: METFORMIN HCL 850 MG TABS (METFORMIN HCL) 1 two times a day with 2 largest meals  #180 x 1   Entered and Authorized by:   Marga Melnick MD   Signed by:   Marga Melnick MD on 02/14/2010   Method used:   Electronically to        [facility] [address] (retail)        [address]       [place], [place]  [address]       Ph: [phone]       Fax: [phone]   RxID:   [id number]    Orders Added: 1)  Est. Patient Level III [84132] 2)  Prescription Created Electronically [phone]

## 2010-02-22 NOTE — Assessment & Plan Note (Signed)
Summary: F6M/DM    CC:  check up.  History of Present Illness: William Faulkner is a pleasant gentleman who has a history of hypertension, aortic insufficiency, and cerebrovascular disease.  He had his last echocardiogram in September 2010 showed normal LV function.  There was mild aortic insufficiency and mitral regurgitation. He also had carotid Dopplers last performed in Sept 2011.  He had a 40-59% right and 0- 39% left.  F/U recommended in one year. A cardiac catheterization in May of 2011 revealed severe three-vessel coronary artery disease and normal LV function. He subsequently underwent coronary artery bypass and graft in May of 2011(left internal mammary artery to left anterior descending artery, saphenous vein graft to first diagonal, saphenous  vein graft obtuse marginal 1, sequential saphenous vein graft to distal  right coronary and posterior descending artery). Note an intraoperative transesophageal echocardiogram showed mild to moderate aortic insufficiency and a dilated aortic root. However this was felt not to require surgical intervention at that time. I last saw him in July of 2011. Since then, the patient denies any dyspnea on exertion, orthopnea, PND, pedal edema, palpitations, syncope or exertional chest pain. Occasional chest soreness.   Current Medications (verified): 1)  Fish Oil 1000mg  .... 1 Tab By Mouth Two Times A Day 2)  Folic Acid With Dha .... 2 Tabs By Mouth Once Daily 3)  Coreg 12.5 Mg  Tabs (Carvedilol) .Marland Kitchen.. 1 Two Times A Day Increase To 1 and 1/2  Two Times A Day If Bp > 145/85 Off Amlodipine 4)  Hydrochlorothiazide 12.5 Mg  Tabs (Hydrochlorothiazide) .Marland Kitchen.. 1 By Mouth Qd 5)  Crestor 20 Mg Tabs (Rosuvastatin Calcium) .... Take One Tablet By Mouth Daily. 6)  Aspirin 81 Mg Tbec (Aspirin) .... One Tab By Mouth Once Daily 7)  Benazepril Hcl 40 Mg Tabs (Benazepril Hcl) .Marland Kitchen.. 1 By Mouth Once Daily 8)  Amoxicillin 500 Mg Caps (Amoxicillin) .... 4 Pills 1 Hr Pre Dental  Work 9)  Janumet 50-500 Mg Tabs (Sitagliptin-Metformin Hcl) .Marland Kitchen.. 1 Two Times A Day With A Meal  Allergies: 1)  ! Sulfa 2)  ! Zolpidem Tartrate (Zolpidem Tartrate) 3)  Celebrex 4)  Lipitor  Past History:  Past Medical History: Reviewed history from 07/28/2009 and no changes required. hypertension Hyperlipidemia Diabetes mellitus, type II Aortic Valve  Insufficiency/ AR bladder cancer, Dr Marcello Fennel CAD  Past Surgical History: Reviewed history from 07/28/2009 and no changes required. Rotator cuff repair 2009 bladder tumor, Surgery X 6, Dr Marcello Fennel lower back surgery colonoscopy  2002 & 2004  negative epidural steroids colonoscopy adenomatous polyps remotely CABG  Social History: Reviewed history from 04/13/2007 and no changes required. Former Smoker quit 1987 Alcohol use-no  Review of Systems       Constipation but no fevers or chills, productive cough, hemoptysis, dysphasia, odynophagia, melena, hematochezia, dysuria, hematuria, rash, seizure activity, orthopnea, PND, pedal edema, claudication. Remaining systems are negative.   Vital Signs:  Patient profile:   76 year old male Height:      74 inches Weight:      229 pounds BMI:     29.51 Pulse rate:   57 / minute Resp:     16 per minute BP sitting:   134 / 52  (left arm)  Vitals Entered By: Kem Parkinson (January 03, 2010 9:35 AM)  Physical Exam  General:  Well-developed well-nourished in no acute distress.  Skin is warm and dry.  HEENT is normal.  Neck is supple. No thyromegaly.  Chest is clear to  auscultation with normal expansion.  Cardiovascular exam is regular rate and rhythm. 2/6 diastolic murmur left sternal border. Abdominal exam nontender or distended. No masses palpated. Extremities show no edema. neuro grossly intact    EKG  Procedure date:  01/03/2010  Findings:      Sinus bradycardia with first degree AV block and right bundle branch block.  Impression &  Recommendations:  Problem # 1:  CAD, ARTERY BYPASS GRAFT (ICD-414.04)  Continue aspirin, beta blocker, ACE inhibitor and statin. His updated medication list for this problem includes:    Coreg 12.5 Mg Tabs (Carvedilol) .Marland Kitchen... 1 two times a day increase to 1 and 1/2  two times a day if bp > 145/85 off amlodipine    Aspirin 81 Mg Tbec (Aspirin) ..... One tab by mouth once daily    Benazepril Hcl 40 Mg Tabs (Benazepril hcl) .Marland Kitchen... 1 by mouth once daily  His updated medication list for this problem includes:    Coreg 12.5 Mg Tabs (Carvedilol) .Marland Kitchen... 1 two times a day increase to 1 and 1/2  two times a day if bp > 145/85 off amlodipine    Aspirin 81 Mg Tbec (Aspirin) ..... One tab by mouth once daily    Benazepril Hcl 40 Mg Tabs (Benazepril hcl) .Marland Kitchen... 1 by mouth once daily  His updated medication list for this problem includes:    Coreg 12.5 Mg Tabs (Carvedilol) .Marland Kitchen... 1 two times a day increase to 1 and 1/2  two times a day if bp > 145/85 off amlodipine    Aspirin 81 Mg Tbec (Aspirin) ..... One tab by mouth once daily    Benazepril Hcl 40 Mg Tabs (Benazepril hcl) .Marland Kitchen... 1 by mouth once daily  Problem # 2:  CEREBROVASCULAR DISEASE (ICD-437.9) Continue aspirin and statin. Followup carotid Dopplers September 2012.  Problem # 3:  DIAB W/O COMP TYPE II/UNS NOT STATED UNCNTRL (ICD-250.00)  His updated medication list for this problem includes:    Aspirin 81 Mg Tbec (Aspirin) ..... One tab by mouth once daily    Benazepril Hcl 40 Mg Tabs (Benazepril hcl) .Marland Kitchen... 1 by mouth once daily    Janumet 50-500 Mg Tabs (Sitagliptin-metformin hcl) .Marland Kitchen... 1 two times a day with a meal  His updated medication list for this problem includes:    Aspirin 81 Mg Tbec (Aspirin) ..... One tab by mouth once daily    Benazepril Hcl 40 Mg Tabs (Benazepril hcl) .Marland Kitchen... 1 by mouth once daily    Janumet 50-500 Mg Tabs (Sitagliptin-metformin hcl) .Marland Kitchen... 1 two times a day with a meal  Problem # 4:  HYPERLIPIDEMIA  (ICD-272.4)  Continue statin. His updated medication list for this problem includes:    Crestor 20 Mg Tabs (Rosuvastatin calcium) .Marland Kitchen... Take one tablet by mouth daily.  His updated medication list for this problem includes:    Crestor 20 Mg Tabs (Rosuvastatin calcium) .Marland Kitchen... Take one tablet by mouth daily.  Problem # 5:  AORTIC INSUFFICIENCY (ICD-424.1)  Plan repeat echocardiogram. Check CTA of aortic root as it was dilated previously. His updated medication list for this problem includes:    Coreg 12.5 Mg Tabs (Carvedilol) .Marland Kitchen... 1 two times a day increase to 1 and 1/2  two times a day if bp > 145/85 off amlodipine    Hydrochlorothiazide 12.5 Mg Tabs (Hydrochlorothiazide) .Marland Kitchen... 1 by mouth qd    Benazepril Hcl 40 Mg Tabs (Benazepril hcl) .Marland Kitchen... 1 by mouth once daily  His updated medication list for this problem includes:  Coreg 12.5 Mg Tabs (Carvedilol) .Marland Kitchen... 1 two times a day increase to 1 and 1/2  two times a day if bp > 145/85 off amlodipine    Hydrochlorothiazide 12.5 Mg Tabs (Hydrochlorothiazide) .Marland Kitchen... 1 by mouth qd    Benazepril Hcl 40 Mg Tabs (Benazepril hcl) .Marland Kitchen... 1 by mouth once daily  Orders: Echocardiogram (Echo)  Orders: Echocardiogram (Echo)  Problem # 6:  HYPERTENSION, ESSENTIAL NOS (ICD-401.9)  Blood pressure controlled on present medications. Will continue. His updated medication list for this problem includes:    Coreg 12.5 Mg Tabs (Carvedilol) .Marland Kitchen... 1 two times a day increase to 1 and 1/2  two times a day if bp > 145/85 off amlodipine    Hydrochlorothiazide 12.5 Mg Tabs (Hydrochlorothiazide) .Marland Kitchen... 1 by mouth qd    Aspirin 81 Mg Tbec (Aspirin) ..... One tab by mouth once daily    Benazepril Hcl 40 Mg Tabs (Benazepril hcl) .Marland Kitchen... 1 by mouth once daily  His updated medication list for this problem includes:    Coreg 12.5 Mg Tabs (Carvedilol) .Marland Kitchen... 1 two times a day increase to 1 and 1/2  two times a day if bp > 145/85 off amlodipine    Hydrochlorothiazide 12.5 Mg  Tabs (Hydrochlorothiazide) .Marland Kitchen... 1 by mouth qd    Aspirin 81 Mg Tbec (Aspirin) ..... One tab by mouth once daily    Benazepril Hcl 40 Mg Tabs (Benazepril hcl) .Marland Kitchen... 1 by mouth once daily  Orders: TLB-BMP (Basic Metabolic Panel-BMET) (80048-METABOL)  Orders: TLB-BMP (Basic Metabolic Panel-BMET) (80048-METABOL)  Other Orders: CT Scan  (CT Scan)  Patient Instructions: 1)  Your physician wants you to follow-up in: ONE YEAR  You will receive a reminder letter in the mail two months in advance. If you don't receive a letter, please call our office to schedule the follow-up appointment. 2)  Non-Cardiac CT Angiography (CTA), is a special type of CT scan that uses a computer to produce multi-dimensional views of major blood vessels throughout the body. In CT angiography, a contrast material is injected through an IV to help visualize the blood vessels.CTA OF THE CHEST WITH CONTRAST FOR DILATED AORTIC ROOT 3)  Your physician has requested that you have an echocardiogram.  Echocardiography is a painless test that uses sound waves to create images of your heart. It provides your doctor with information about the size and shape of your heart and how well your heart's chambers and valves are working.  This procedure takes approximately one hour. There are no restrictions for this procedure. Prescriptions: CRESTOR 20 MG TABS (ROSUVASTATIN CALCIUM) Take one tablet by mouth daily.  #90 x 3   Entered by:   Deliah Goody, RN   Authorized by:   Ferman Hamming, MD, [facility]   Signed by:   Deliah Goody, RN on 01/03/2010   Method used:   Faxed to ...       [facility] (mail-order)             , [place]         Ph: [phone]       Fax: [phone]   RxID:   [id number]

## 2010-03-08 NOTE — Letter (Signed)
Summary: [facility]   Imported By: Sherian Rein 02/27/2010 10:29:03  _____________________________________________________________________  External Attachment:    Type:   Image     Comment:   External Document

## 2010-03-08 NOTE — Letter (Signed)
Summary: [facility]  [facility]   Imported By: Lanelle Bal 02/26/2010 14:16:31  _____________________________________________________________________  External Attachment:    Type:   Image     Comment:   External Document

## 2010-03-28 ENCOUNTER — Encounter: Payer: Self-pay | Admitting: Internal Medicine

## 2010-04-08 LAB — GLUCOSE, CAPILLARY
Glucose-Capillary: 105 mg/dL — ABNORMAL HIGH (ref 70–99)
Glucose-Capillary: 69 mg/dL — ABNORMAL LOW (ref 70–99)
Glucose-Capillary: 86 mg/dL (ref 70–99)
Glucose-Capillary: 99 mg/dL (ref 70–99)

## 2010-04-09 LAB — POCT I-STAT 3, ART BLOOD GAS (G3+)
Acid-Base Excess: 1 mmol/L (ref 0.0–2.0)
Acid-base deficit: 2 mmol/L (ref 0.0–2.0)
Acid-base deficit: 2 mmol/L (ref 0.0–2.0)
Acid-base deficit: 2 mmol/L (ref 0.0–2.0)
Acid-base deficit: 3 mmol/L — ABNORMAL HIGH (ref 0.0–2.0)
Acid-base deficit: 4 mmol/L — ABNORMAL HIGH (ref 0.0–2.0)
Bicarbonate: 21.7 mEq/L (ref 20.0–24.0)
Bicarbonate: 22.5 mEq/L (ref 20.0–24.0)
Bicarbonate: 24 mEq/L (ref 20.0–24.0)
Bicarbonate: 25.4 mEq/L — ABNORMAL HIGH (ref 20.0–24.0)
Bicarbonate: 25.9 mEq/L — ABNORMAL HIGH (ref 20.0–24.0)
O2 Saturation: 100 %
O2 Saturation: 100 %
O2 Saturation: 98 %
Patient temperature: 35.7
Patient temperature: 36.9
Patient temperature: 37.4
Patient temperature: 37.8
TCO2: 23 mmol/L (ref 0–100)
TCO2: 24 mmol/L (ref 0–100)
TCO2: 25 mmol/L (ref 0–100)
TCO2: 27 mmol/L (ref 0–100)
TCO2: 27 mmol/L (ref 0–100)
pCO2 arterial: 33.3 mmHg — ABNORMAL LOW (ref 35.0–45.0)
pCO2 arterial: 35.7 mmHg (ref 35.0–45.0)
pH, Arterial: 7.365 (ref 7.350–7.450)
pH, Arterial: 7.406 (ref 7.350–7.450)
pO2, Arterial: 92 mmHg (ref 80.0–100.0)

## 2010-04-09 LAB — CBC
HCT: 31.9 % — ABNORMAL LOW (ref 39.0–52.0)
HCT: 33.9 % — ABNORMAL LOW (ref 39.0–52.0)
HCT: 36.5 % — ABNORMAL LOW (ref 39.0–52.0)
HCT: 41.9 % (ref 39.0–52.0)
Hemoglobin: 10.9 g/dL — ABNORMAL LOW (ref 13.0–17.0)
Hemoglobin: 10.9 g/dL — ABNORMAL LOW (ref 13.0–17.0)
Hemoglobin: 11.6 g/dL — ABNORMAL LOW (ref 13.0–17.0)
Hemoglobin: 14.1 g/dL (ref 13.0–17.0)
MCHC: 33.8 g/dL (ref 30.0–36.0)
MCHC: 34.3 g/dL (ref 30.0–36.0)
MCHC: 34.3 g/dL (ref 30.0–36.0)
MCV: 85.4 fL (ref 78.0–100.0)
MCV: 85.5 fL (ref 78.0–100.0)
MCV: 86.1 fL (ref 78.0–100.0)
Platelets: 133 10*3/uL — ABNORMAL LOW (ref 150–400)
Platelets: 134 10*3/uL — ABNORMAL LOW (ref 150–400)
Platelets: 185 10*3/uL (ref 150–400)
RBC: 3.73 MIL/uL — ABNORMAL LOW (ref 4.22–5.81)
RBC: 3.74 MIL/uL — ABNORMAL LOW (ref 4.22–5.81)
RDW: 13.5 % (ref 11.5–15.5)
RDW: 13.9 % (ref 11.5–15.5)
RDW: 14.1 % (ref 11.5–15.5)
RDW: 14.1 % (ref 11.5–15.5)
WBC: 10.1 10*3/uL (ref 4.0–10.5)

## 2010-04-09 LAB — POCT I-STAT 4, (NA,K, GLUC, HGB,HCT)
Glucose, Bld: 103 mg/dL — ABNORMAL HIGH (ref 70–99)
Glucose, Bld: 107 mg/dL — ABNORMAL HIGH (ref 70–99)
Glucose, Bld: 108 mg/dL — ABNORMAL HIGH (ref 70–99)
Glucose, Bld: 121 mg/dL — ABNORMAL HIGH (ref 70–99)
Glucose, Bld: 127 mg/dL — ABNORMAL HIGH (ref 70–99)
Glucose, Bld: 143 mg/dL — ABNORMAL HIGH (ref 70–99)
Glucose, Bld: 98 mg/dL (ref 70–99)
HCT: 29 % — ABNORMAL LOW (ref 39.0–52.0)
HCT: 30 % — ABNORMAL LOW (ref 39.0–52.0)
HCT: 37 % — ABNORMAL LOW (ref 39.0–52.0)
HCT: 38 % — ABNORMAL LOW (ref 39.0–52.0)
Hemoglobin: 12.6 g/dL — ABNORMAL LOW (ref 13.0–17.0)
Hemoglobin: 12.9 g/dL — ABNORMAL LOW (ref 13.0–17.0)
Hemoglobin: 9.5 g/dL — ABNORMAL LOW (ref 13.0–17.0)
Hemoglobin: 9.9 g/dL — ABNORMAL LOW (ref 13.0–17.0)
Hemoglobin: 9.9 g/dL — ABNORMAL LOW (ref 13.0–17.0)
Potassium: 4 mEq/L (ref 3.5–5.1)
Potassium: 4.1 mEq/L (ref 3.5–5.1)
Potassium: 4.1 mEq/L (ref 3.5–5.1)
Potassium: 5.7 mEq/L — ABNORMAL HIGH (ref 3.5–5.1)
Potassium: 6.2 mEq/L — ABNORMAL HIGH (ref 3.5–5.1)
Sodium: 133 mEq/L — ABNORMAL LOW (ref 135–145)
Sodium: 133 mEq/L — ABNORMAL LOW (ref 135–145)
Sodium: 134 mEq/L — ABNORMAL LOW (ref 135–145)
Sodium: 141 mEq/L (ref 135–145)

## 2010-04-09 LAB — MAGNESIUM
Magnesium: 2.3 mg/dL (ref 1.5–2.5)
Magnesium: 3 mg/dL — ABNORMAL HIGH (ref 1.5–2.5)

## 2010-04-09 LAB — BASIC METABOLIC PANEL
CO2: 26 mEq/L (ref 19–32)
CO2: 27 mEq/L (ref 19–32)
Calcium: 8.4 mg/dL (ref 8.4–10.5)
Calcium: 8.5 mg/dL (ref 8.4–10.5)
Calcium: 8.5 mg/dL (ref 8.4–10.5)
Chloride: 105 mEq/L (ref 96–112)
Chloride: 106 mEq/L (ref 96–112)
Creatinine, Ser: 0.99 mg/dL (ref 0.4–1.5)
Creatinine, Ser: 1 mg/dL (ref 0.4–1.5)
GFR calc Af Amer: >60 mL/min (ref >60)
GFR calc Af Amer: >60 mL/min (ref >60)
GFR calc Af Amer: >60 mL/min (ref >60)
Glucose, Bld: 101 mg/dL — ABNORMAL HIGH (ref 70–99)
Potassium: 3.6 mEq/L (ref 3.5–5.1)
Sodium: 138 mEq/L (ref 135–145)
Sodium: 139 mEq/L (ref 135–145)
Sodium: 139 mEq/L (ref 135–145)

## 2010-04-09 LAB — POCT I-STAT 3, VENOUS BLOOD GAS (G3P V)
TCO2: 26 mmol/L (ref 0–100)
pH, Ven: 7.324 — ABNORMAL HIGH (ref 7.250–7.300)

## 2010-04-09 LAB — PROTIME-INR
INR: 1.06 (ref 0.00–1.49)
Prothrombin Time: 13.7 seconds (ref 11.6–15.2)

## 2010-04-09 LAB — GLUCOSE, CAPILLARY
Glucose-Capillary: 109 mg/dL — ABNORMAL HIGH (ref 70–99)
Glucose-Capillary: 112 mg/dL — ABNORMAL HIGH (ref 70–99)
Glucose-Capillary: 120 mg/dL — ABNORMAL HIGH (ref 70–99)
Glucose-Capillary: 132 mg/dL — ABNORMAL HIGH (ref 70–99)
Glucose-Capillary: 135 mg/dL — ABNORMAL HIGH (ref 70–99)
Glucose-Capillary: 151 mg/dL — ABNORMAL HIGH (ref 70–99)
Glucose-Capillary: 94 mg/dL (ref 70–99)

## 2010-04-09 LAB — BLOOD GAS, ARTERIAL
Bicarbonate: 24.4 mEq/L — ABNORMAL HIGH (ref 20.0–24.0)
O2 Saturation: 97.1 %
TCO2: 25.5 mmol/L (ref 0–100)
pO2, Arterial: 77.1 mmHg — ABNORMAL LOW (ref 80.0–100.0)

## 2010-04-09 LAB — URINALYSIS, MICROSCOPIC ONLY
Hgb urine dipstick: NEGATIVE
Nitrite: NEGATIVE
Specific Gravity, Urine: 1.024 (ref 1.005–1.030)
Urobilinogen, UA: 0.2 mg/dL (ref 0.0–1.0)
pH: 5 (ref 5.0–8.0)

## 2010-04-09 LAB — PLATELET COUNT: Platelets: 139 10*3/uL — ABNORMAL LOW (ref 150–400)

## 2010-04-09 LAB — COMPREHENSIVE METABOLIC PANEL
Albumin: 3.6 g/dL (ref 3.5–5.2)
Alkaline Phosphatase: 43 U/L (ref 39–117)
BUN: 19 mg/dL (ref 6–23)
Calcium: 9.2 mg/dL (ref 8.4–10.5)
Creatinine, Ser: 1 mg/dL (ref 0.4–1.5)
Glucose, Bld: 149 mg/dL — ABNORMAL HIGH (ref 70–99)
Total Protein: 6.6 g/dL (ref 6.0–8.3)

## 2010-04-09 LAB — TSH: TSH: 2.452 u[IU]/mL (ref 0.350–4.500)

## 2010-04-09 LAB — CREATININE, SERUM: GFR calc non Af Amer: >60 mL/min (ref >60)

## 2010-04-09 LAB — APTT: aPTT: 26 seconds (ref 24–37)

## 2010-04-09 LAB — LIPID PANEL
LDL Cholesterol: 82 mg/dL (ref 0–99)
VLDL: 35 mg/dL (ref 0–40)

## 2010-04-09 LAB — POCT I-STAT, CHEM 8
BUN: 15 mg/dL (ref 6–23)
Calcium, Ion: 1.24 mmol/L (ref 1.12–1.32)
Chloride: 106 mEq/L (ref 96–112)
Glucose, Bld: 153 mg/dL — ABNORMAL HIGH (ref 70–99)
HCT: 34 % — ABNORMAL LOW (ref 39.0–52.0)
Potassium: 4.6 mEq/L (ref 3.5–5.1)

## 2010-04-09 LAB — ABO/RH: ABO/RH(D): O POS

## 2010-05-09 ENCOUNTER — Other Ambulatory Visit: Payer: Self-pay | Admitting: Internal Medicine

## 2010-05-09 ENCOUNTER — Other Ambulatory Visit (INDEPENDENT_AMBULATORY_CARE_PROVIDER_SITE_OTHER): Payer: 59

## 2010-05-09 DIAGNOSIS — E119 Type 2 diabetes mellitus without complications: Secondary | ICD-10-CM

## 2010-05-09 DIAGNOSIS — T887XXA Unspecified adverse effect of drug or medicament, initial encounter: Secondary | ICD-10-CM

## 2010-05-09 LAB — BUN: BUN: 26 mg/dL — ABNORMAL HIGH (ref 6–23)

## 2010-05-16 ENCOUNTER — Ambulatory Visit (INDEPENDENT_AMBULATORY_CARE_PROVIDER_SITE_OTHER): Payer: 59 | Admitting: Internal Medicine

## 2010-05-16 ENCOUNTER — Encounter: Payer: Self-pay | Admitting: Internal Medicine

## 2010-05-16 DIAGNOSIS — R609 Edema, unspecified: Secondary | ICD-10-CM

## 2010-05-16 DIAGNOSIS — I359 Nonrheumatic aortic valve disorder, unspecified: Secondary | ICD-10-CM

## 2010-05-16 DIAGNOSIS — R259 Unspecified abnormal involuntary movements: Secondary | ICD-10-CM

## 2010-05-16 DIAGNOSIS — R251 Tremor, unspecified: Secondary | ICD-10-CM

## 2010-05-16 DIAGNOSIS — I10 Essential (primary) hypertension: Secondary | ICD-10-CM

## 2010-05-16 DIAGNOSIS — E119 Type 2 diabetes mellitus without complications: Secondary | ICD-10-CM

## 2010-05-16 MED ORDER — FUROSEMIDE 20 MG PO TABS: 20.0000 mg | ORAL_TABLET | Freq: Every day | ORAL | Status: AC

## 2010-05-16 NOTE — Patient Instructions (Signed)
   Please repeat A1c and urine microalbumin in 4 months (250.2). Please assess response and the like symptoms to changing HCTZ to furosemide 20 mg daily.

## 2010-05-16 NOTE — Progress Notes (Signed)
Subjective:    Patient ID: William Faulkner, male    DOB: 1934/05/12, 76 y.o.   MRN: [id number]  HPI  #1 Diabetes status assessment :Fasting or morning glucose range:  108-136 or average :  120    ;   Highest glucose 2 hours after any meal:  < 150 ;  Hypoglycemia :  no  .                                                           Excess thirst :no;  Excess hunger:  no ;  Excess urination:  no .                                            Lightheadedness with standing:  no ; Chest pain:  no ; Palpitations :no ;  .                                                                                                                                                              Non healing skin  ulcers or sores,especially over the feet:  no ; Numbness or tingling or burning in feet : yes.                                                                                                                                                 Significant change in  Weight : stable  ;Vision changes : no  Exercise : walks 2.5-3.8 mpd qod  ;  Nutrition/diet:  low carb, low sugar;   Medication compliance : yes ; Medication adverse  Effects:  ? Causing leg symptoms (see below)  ;  Eye exam : due 07/12 ; Foot care : no ;  A1c/ urine microalbumin monitor:   6.9% ( average sugar 151, risk 38%). Goals & risks discussed   #2 Edema: He describes sensation of pressure in the legs below the knees over the last couple of months. He denies true claudication. He also does not feel he is having significant edema. He denies exertional dyspnea and paroxysmal nocturnal dyspnea. Not on Amlodipine; it caused edema.     Review of Systems he denies joint swelling, redness, or pain in the knees or feet.  He denies any stool or urinary incontinence.       Objective:   Physical Exam Gen.: Healthy and well-nourished in appearance. Alert, appropriate and cooperative throughout  exam. Neck: No deformities, masses, or tenderness noted. No NVD @ 10 degreesLungs: Normal respiratory effort; chest expands symmetrically. Lungs are clear to auscultation without rales, wheezes, or increased work of breathing. Heart: Normal rate and rhythm. Normal S1 and S2. No gallop, click, or rub. Loud to & fro aortic murmur. Abdomen: Bowel sounds normal; abdomen soft and nontender. No masses, organomegaly or hernias noted. Musculoskeletal/extremities: No deformity or scoliosis noted of  the thoracic or lumbar spine. No clubbing, cyanosis,  or deformity noted. Range of motion  normal .Tone & strength  normal.Joints normal. Nail health  Good.trace edema of legs Vascular: Carotid, radial artery, and dorsalis posterior tibial pulses are full and equal. Decreased DPP Neurologic: Alert and oriented x3. Light touch over feet normal.   Skin: Intact without suspicious lesions or rashes. Lymph: No cervical, axillary, or inguinal lymphadenopathy present. Psych: Mood and affect are normal. Normally interactive                                                                                        Assessment & Plan:  #1 diabetes, adequate control with an A1c of 6.9  #2 pressure sensation in the lower legs; peripheral neuropathy is suggested  #3 trace edema  #4 past medical history of intolerance to sulfa; it did not cause a rash and fever.  Plan: #1 no change in diabetic medicine; recheck A1c in 4 months  #2 trial of furosemide 20 mg daily in place of HCTZ 12.5 mg daily.  #3 if the leg symptoms fail to improve or if they progress neurologic evaluation we pursued.

## 2010-05-16 NOTE — Progress Notes (Signed)
Addended byMarga Melnick on: 05/16/2010 10:25 AM   Modules accepted: Orders

## 2010-06-05 NOTE — Assessment & Plan Note (Signed)
OFFICE VISIT   KORION, CUEVAS  DOB:  1934/10/12                                        July 12, 2009  CHART #:  [id number]   HISTORY:  The patient is a 76 year old gentleman recently hospitalized  and underwent a coronary artery bypass grafting x5 on Jun 08, 2009, for  severe three-vessel coronary artery disease with exertional angina.  We  saw him in the office last week and on chest x-ray he was noted to have  some left-sided greater than right-sided pleural effusion.  He was given  an additional 7 days of oral Lasix and potassium.  Currently, he says he  feels significantly improved in that regard.  He denies shortness of  breath.  He denies anginal equivalents or chest pain.  He is sleeping  much better.   Chest x-ray was obtained on today's date.  It reveals left-sided  effusion is improved and the right-sided effusion is essentially gone.   PHYSICAL EXAM:  Vital Signs:  Blood pressure is 163/70, pulse 64,  respirations 18, oxygen saturation is 97% on room air.  Note, the  patient has been taking his blood pressure at home and this is the  highest reading he has had.  Typically, it is in the 120-130.  General  Appearance:  A well-developed adult male in no acute distress.  He does  appear stronger than last week and generally appears more comfortable  and less anxious.  Pulmonary examination reveals slightly diminished  breath sounds in the left base, otherwise clear.  Cardiac examination:  Regular rate and rhythm.  Extremities reveal mild bilateral lower  extremity edema, right greater than left.  Incisions are well healed  without evidence of infection.   ASSESSMENT:  The patient is making continuous improvement in his  surgical recovery.  He is scheduled to see Dr. [name] in July.  He is  currently on oral hydrochlorothiazide 12.5 mg daily and we will continue  this as the diuretic for now.  We have given him further instructions  in  regard to activity progression and driving.  He plans to start  cardiac rehab formally next week.  We will see him again on a p.r.n.  basis for any surgical issues that may present or at request.   Rowe Clack, P.A.-C.   Sherryll Burger  D:  07/12/2009  T:  07/13/2009  Job:  [id number]   cc:   Titus Dubin. Alwyn Ren, MD,FACP,FCCP

## 2010-06-05 NOTE — Assessment & Plan Note (Signed)
[facility]                               LIPID CLINIC NOTE   Faulkner, William                   MRN:          [id number]  DATE:01/26/2007                            DOB:          November 04, 1934    William Faulkner is seen with William Faulkner today in the Lipid Clinic  for initial evaluation associated with his inability to take Statins and  need for significant LDL lowering.  The patient has a history of  elevated CPK and unspecified myalgia on Lipitor and Crestor when taken  in sporadic fashion.  The patient is currently on Welchol and Zetia.  The patient states he has some muscle discomfort, though he had no  darkening of the urine, side pain, flank pain, or others when taking  Crestor to once a week 20 mg dose.  He has had no chest pain or problem  in the past.  He is a gentleman who appears very reliable.  He has  periodic exercise that he works into his daily activity.  He has dietary  intake that follows American Heart Association guidelines for the most  part.   CURRENT MEDICATIONS:  1. Recent discontinuation of azithromycin for upper respiratory      infection.  2. Glimepiride 1 mg daily.  3. Zetia 10 mg daily.  4. Benazepril 40 mg daily.  5. Aleve 1 tablet daily.  6. Fish oil 1000 mg twice daily.  7. Folic acid 10 mg daily.  8. Ketoconazole 2% as needed.  9. Coreg 12.5 mg twice daily.  10.Glucosamine daily.  11.Amlodipine 5 mg daily.  12.Hydrochlorothiazide 12.5 mg daily.  13.P.r.n. promethazine codeine cough syrup.   REVIEW OF SYSTEMS:  As stated in the HPI, otherwise negative.   PHYSICAL EXAM:  Weight 248 pounds.  Blood pressure is 120/68,  respirations 16, heart rate is 72.  The patient is well-appearing in no acute distress.   Labs faxed over from the [place] include a creatinine  kinase of 343, slightly above the goal of 7-195 on labs as obtained on  January 14, 2007.  Lipid profile reveals a total  cholesterol of 190,  HDL 40.3, triglycerides 136, LDL 1.3, ALT and AST within normal limits.  Serum creatinine 1.1 as obtained on December 17, 2006.   ASSESSMENT:  The patient's CK did elevate slightly though it appears to  me this is absolutely indicative with a problem associated with his once  weekly Crestor therapy.  I have discussed several options with the  patient.  Currently, we will continue the Welchol 6 tablets daily, and  Zetia 10 mg daily with the consideration that, down the road, if this is  not appropriate in lowering LDL to our target of less than 100, we will  consider having the patient retry a re-challenge with Statin.  [name] states that he is pretty opposed to this at this time.  I  have given him some literature to read and think about, and he will  follow up with me accordingly.  We have had a lovely visit, and I think  that we  both understand that we will work together to improve  cholesterol, and that I want the patient to take away that he has lots  of options to improve his numbers.  He verbalizes an understanding of  this.      Shelby Dubin, PharmD, BCPS, CPP  Electronically Signed      Rollene Rotunda, MD, [facility]  Electronically Signed   MP/MedQ  DD: 02/19/2007  DT: 02/19/2007  Job #: [id number]   cc:   Titus Dubin. Alwyn Ren, MD,FACP,FCCP

## 2010-06-05 NOTE — Op Note (Signed)
NAMERAJU, COPPOLINO            ACCOUNT NO.:  [id number]   MEDICAL RECORD NO.:  [id number]          PATIENT TYPE:  AMB   LOCATION:  DAY                          FACILITY:  [facility]   PHYSICIAN:  Marlowe Kays, M.D.  DATE OF BIRTH:  Jun 10, 1934   DATE OF PROCEDURE:  02/03/2007  DATE OF DISCHARGE:                               OPERATIVE REPORT   PREOPERATIVE DIAGNOSES:  1. Labral degeneration and tear.  2. Degenerative arthritis acromioclavicular joint.  3. Full-thickness rotator cuff tear with retraction right shoulder.   POSTOPERATIVE DIAGNOSES:  1. Labral degeneration and tear.  2. Degenerative arthritis acromioclavicular joint.  3. Full-thickness rotator cuff tear with retraction right shoulder.   OPERATION:  1. Right shoulder arthroscopy with debridement of labrum and reactive      synovitis.  2. Open distal clavicle resection.  3. Anterior acromionectomy with repair of torn rotator cuff.   SURGEON:  Marlowe Kays, M.D.   ASSISTANTDruscilla Brownie. Idolina Primer, PA-C.   ANESTHESIA:  General.   PATHOLOGY AND JUSTIFICATION FOR PROCEDURE:  Because of a painful right  shoulder, he had an MRI demonstrating preoperative diagnoses. He is here  today for correction. See operative description below for additional  details.   DESCRIPTION OF PROCEDURE:  Prophylactic antibiotics, satisfactory  general anesthesia, beach chair position on the Carroll frame, right  shoulder girdle was prepped with DuraPrep, draped in a sterile field,  timeout performed. I marked out the pertinent anatomy for a distal  clavicle resection, rotator cuff repair and arthroscopy. After injecting  these areas as well as the posterior portal, through a stab I placed a  blunt trocar atraumatically in the glenohumeral joint. On inspection,  the fact that the biceps tendon had been torn was confirmed. He had some  mild to moderate wear of the articular surface of the glenoid. There was  a good bit of labral  degeneration which could very well have been  creating an impingement problem particularly at the former biceps anchor  but there was no formal labral tear. Consequently, I advanced the scope  anteriorly and using a switching stick made an anterior incision over  which I placed a metal cannula followed by a 4.2 shaver. I then debrided  down the joint and the labrum. I then removed all fluid possible and  proceed with the open portion of the procedure making an incision over  the distal clavicle, identifying the [facility] joint and then measuring 1.5 cm  medial to it. I then undermined the clavicle at this point and placed  two baby Homans and using a microsaw amputated the clavicle at this  point removing it with towel clip and cautery technique. There were no  remaining spicules on the apparent clavicle which I covered with bone  wax. I then extended the dissection lateralward and with subperiosteal  dissection exposed the anterior acromion and slightly split the deltoid.  After the undermining the anterior acromion with a small Cobb elevator,  I used a larger elevator to protect the underlying structures and made  my first decompression pass with the microsaw actually making several  more and also  used a rongeur and rasp to make sure there was adequate  decompression of the rotator cuff. The rotator cuff tear was quite  evident being about 2 cm wide with about 1.5 cm of retraction. I  pictured this with the scope. I then abraded the area just above the  greater tuberosity and used a Mitek rotator cuff anchor with 4 suture  strands which allowed me to completely cover the rotator cuff tear  length. With the arm slightly abducted, I then brought the rotator cuff  back to its normal anatomic position and extended the sutures through  lateral bursa tissue tying the first two from anterior to posterior and  then the posterior 2 to each other and then tying the two strands once  again. It seemed to  give a nice strong repair with his arm to his side.  I also used a #1 Vicryl to bolster up an area in the anterior rotator  cuff which did appear to be intact. I then irrigated the wound well with  sterile saline and infiltrated all soft tissues with 0.5% Marcaine with  adrenaline, placed Gelfoam in the clavicle resection site and then  closed the wound with interrupted #1 Vicryl in the fascia and periosteum  over the distal clavicle and acromion, 2-0 Vicryl in the subcutaneous  tissue and Steri-Strips on the skin with 4-0 nylon in the 2 portals.  Betadine adaptic dry sterile dressing and shoulder immobilizer applied.  He tolerated the procedure well and was taken to the recovery room in  satisfactory condition with no known complications.           ______________________________  Marlowe Kays, M.D.     JA/MEDQ  D:  02/03/2007  T:  02/03/2007  Job:  [id number]

## 2010-06-05 NOTE — Assessment & Plan Note (Signed)
[facility]                            CARDIOLOGY OFFICE NOTE   William Faulkner                   MRN:          [id number]  DATE:03/07/2008                            DOB:          [birth year]    William Faulkner is a pleasant gentleman who has a history of  hypertension, aortic insufficiency, and cerebrovascular disease.  He had  his last echocardiogram on September 16, 2007.  At that time, he had normal  LV function.  There was eccentric aortic insufficiency, felt to be mild  and possibly moderate.  There was mild aortic root dilatation.  The left  atrium was mildly dilated.  He also had carotid Dopplers last performed  in August.  This was on September 16, 2007.  He had a 40-59% right and 0-  39% left.  Since I last saw him, he is doing well symptomatically.  He  denies any chest pain, shortness of breath, palpitations, or syncope.  There is no pedal edema.   His medications include:  1. Metformin 500 mg p.o. b.i.d.  2. Coreg 12.5 mg p.o. b.i.d.  3. Hydrochlorothiazide 12.5 mg p.o. daily.  4. Glimepiride 1 mg p.o. daily.  5. Fish oil.  6. Folate.  7. Norvasc 5 mg p.o. daily.  8. Crestor 10 mg p.o. daily.   His physical exam today shows a blood pressure of 130/70 and his pulse  is 58.  He weighs 246 pounds.  His HEENT is normal.  His neck is supple.  Chest clear.  Cardiovascular exam reveals a regular rate and rhythm.  There is a 2/6 diastolic murmur at the left sternal border.  Abdominal  exam shows no tenderness.  Extremities show no edema.   His electrocardiogram shows a sinus rhythm at a rate of 58.  There is a  first-degree AV block.  There are nonspecific ST changes.  There are  occasional PACs.   DIAGNOSES:  1. Hypertension - [name]'s blood pressure apparently is      running high at home.  His systolic is in the 140 range.  I have      asked him to increase his Norvasc to 10 mg p.o. daily and continue      his other  medications.  2. Aortic insufficiency - we will plan to repeat his echocardiogram in      August.  3. Hyperlipidemia - he will continue on his Crestor.  This is being      managed in our Lipid Clinic.  4. Cerebrovascular disease - we will continue with his statin, and I      am adding aspirin 81 mg p.o. daily.  He will have followup carotid      Dopplers in August.   We will see him back in 1 year.    Madolyn Frieze William Som, MD, [facility]  Electronically Signed   BSC/MedQ  DD: 03/07/2008  DT: 03/08/2008  Job #: [id number]

## 2010-06-05 NOTE — Assessment & Plan Note (Signed)
[facility]                            CARDIOLOGY OFFICE NOTE   PEYTEN, [facility]                   MRN:          [id number]  DATE:11/05/2006                            DOB:          29-Jan-1934    William [facility] is a 76 year old gentleman that I recently saw secondary  to hypertension and aortic insufficiency.  At that time he was having  dyspnea with more extreme activities as well as pedal edema.  We did  perform an echocardiogram on September 17, 2006.  His left ventricular  function was normal and the left ventricular size was also normal.  There was mild-to-moderate aortic insufficiency and mild aortic root  dilatation.  We also performed carotid Dopplers due to bruits.  This  showed no significant internal carotid artery stenosis.  We also added  hydrochlorothiazide, both for his blood pressure and his edema.  Since  then his dyspnea has resolved.  His pedal edema has also resolved.  There is no chest pain, palpitations or syncope.  He does state that his  systolic blood pressure continues to run in the 140 to 150 range.   MEDICATIONS:  1. Carvedilol 12.5 mg p.o. b.i.d.  2. Glimepiride 1 mg p.o. daily.  3. Welchol 625 mg tablets 6 p.o. daily.  4. Zetia 10 mg p.o. daily.  5. Benazepril 40 mg p.o. daily.  6. Glucosamine.  7. Fish oil.  8. Folate.  9. Aspirin 81 mg p.o. daily.  10.Hydrochlorothiazide 12.5 mg p.o. daily.   PHYSICAL EXAMINATION:  Blood pressure of 164/68 and his pulse is 60.  He  weighs 237 pounds.  His HEENT is normal.  His neck is supple.  His chest is clear.  His cardiovascular exam reveals a regular rate and rhythm.  There is a  2/6 diastolic murmur at the left sternal border.  His abdominal exam shows no tenderness.  Extremities show trace edema.   DIAGNOSES:  1. Hypertension:  The patient's blood pressure remains elevated      despite the addition of a diuretic.  We will add Norvasc 5 mg p.o.      daily.  We  could increase this to 10 mg if necessary.  He will      continue to track his blood pressure at home.  2. Aortic insufficiency:  His echocardiogram shows normal left      ventricular size and function.  His aortic insufficiency is mild to      moderate.  He will need a followup echocardiogram in 1 year.  3. Hyperlipidemia:  This is being managed by Dr. Alwyn Ren.  Given his      history of diabetes mellitus, he would benefit from a statin if he      would tolerate.  Note:  Lipitor apparently caused myalgias.  I will      leave it to Dr. Frederik Pear discretion whether he may benefit from a      trail of Pravachol, Zocor, or Crestor.   We will see the patient back in 6 months.     [facility] William Som, MD, [facility]  Electronically Signed  BSC/MedQ  DD: 11/05/2006  DT: 11/05/2006  Job #: [id number]

## 2010-06-05 NOTE — Assessment & Plan Note (Signed)
[facility]                               LIPID CLINIC NOTE   [facility] Fa[facility], [facility] Fa[facility]                   MRN:          [id number]  DATE:03/30/2007                            DOB:          Oct 02, 1934    The patient is seen in the lipid clinic for further evaluation and  medication titration.   He states he has his hyperlipidemia in the setting of documented  medication intolerances.  The patient has been able since his last visit  to tolerate Crestor 10 mg daily by breaking that tablet in half and  taking a half tablet every other day.   He has been feeling and doing well.  He has had no muscle aches, pains,  weakness, fatigue or other problems.  He has been compliant with his  other therapies.   PAST MEDICAL HISTORY:  As noted in my previous note.   CURRENT MEDICATIONS:  Updated and include Crestor 10 mg one half tablet  every other day.   PHYSICAL EXAMINATION:  VITAL SIGNS:  Weight is 247, blood pressure is  120/74, heart rate is 68, respirations are 17.   LABORATORY:  Has not yet been obtained.   ASSESSMENT:  The patient has hyperlipidemia.  He has been intolerant to  medications in the past but is doing well thus far on an alternating  fashion of Crestor low dose therapy.   The patient will work to increase to Crestor 10 mg one tablet every  other day, samples have been provided, still take compliance.  We will  check his labs back in the next 6-8 weeks.   Thank you for this opportunity to see this pleasant patient.      Shelby Dubin, PharmD, BCPS, CPP  Electronically Signed      Rollene Rotunda, MD, [facility]  Electronically Signed   MP/MedQ  DD: 05/15/2007  DT: 05/15/2007  Job #: [id number]   cc:   Dr. Alwyn Ren

## 2010-06-05 NOTE — Assessment & Plan Note (Signed)
[facility]                            CARDIOLOGY OFFICE NOTE   [facility] Fa[facility], William Fa[facility]                   MRN:          [id number]  DATE:06/10/2007                            DOB:          04/17/1934    William Fa[facility]ner is a very pleasant 76 year old gentleman who has a  history of hypertension and aortic insufficiency.  An echocardiogram on  September 17, 2006, showed normal LV function, mild to moderate aortic  insufficiency, mild aortic root dilatation.  Since I last saw him, he is  doing well from a symptomatic standpoint.  There is no significant  dyspnea on exertion, orthopnea, PND, pedal edema, palpitations,  presyncope, syncope or chest pain.  Note, he did have a Myoview in  December 2005 that showed no ischemia or infarction and ejection  fraction of 59%.   MEDICATIONS:  1. Glimepiride 1 mg p.o. daily.  2. Benazepril 40 mg daily.  3. Fish oil.  4. Folate.  5. Aspirin 81 mg daily.  6. Hydrochlorothiazide 12.5 mg daily.  7. Norvasc 5 mg daily.  8. Crestor 10 mg p.o. daily.  9. Coreg 12.5 mg p.o. b.i.d.   PHYSICAL EXAMINATION:  VITAL SIGNS:  Today, blood pressure of 148/59.  His pulse 57.  Weighs 238 pounds.  HEENT:  Normal.  NECK:  Supple.  There are bilateral bruits.  CHEST:  Clear.  CARDIOVASCULAR:  Regular rate and rhythm.  There is a 2/6 systolic and  diastolic murmur at the left sternal border.  ABDOMEN:  No tenderness.  EXTREMITIES:  No edema.   His electrocardiogram shows sinus bradycardia at a rate of 56.  There is  first-degree AV block.  There are nonspecific ST changes.   DIAGNOSES:  1. Hypertension - his blood pressure is mildly elevated today.      However, he states it typically runs in the 120-130/60 range.  We      will track this and we can increase his Norvasc as needed.  2. Aortic insufficiency - he is not symptomatic at present.  He will      need a follow-up echocardiogram in August and we will arrange this.  3.  Hyperlipidemia - this is being managed in our lipid clinic.  He      will continue on his Crestor.   We will see him back in approximately 9 months.     [facility] William Som, MD, [facility]  Electronically Signed    BSC/MedQ  DD: 06/10/2007  DT: 06/10/2007  Job #: [id number]

## 2010-06-05 NOTE — Assessment & Plan Note (Signed)
[facility]                            CARDIOLOGY OFFICE NOTE   JAIR, LINDBLAD                   MRN:          [id number]  DATE:09/08/2006                            DOB:          14-Sep-1934    Mr. Townley is a 76 year old gentleman with a past medical history of  diabetes mellitus, hypertension, hyperlipidemia, aortic insufficiency,  whom I am asked to evaluate for aortic insufficiency.  The patient  states that Dr. Alwyn Ren told him he had aortic insufficiency some time  ago.  He had a questionable echocardiogram, but those results are not  available.  He did have a Myoview on January 10, 2004.  His ejection  fraction was 59%, and there was no ischemia or infarction.  He typically  does not have dyspnea with routine activities, but he can have dyspnea  with more extreme activities.  There is no orthopnea or PND, and there  is no chest pain or syncope.  Over the past 1-2 months, he has noticed  increasing pedal edema.  It gets worse throughout the day, and then  improved overnight.  Because of the above, we were asked to further  evaluate.   MEDICATIONS:  1. Carvedilol 12.5 mg p.o. b.i.d.  2. Glimepiride 1 mg p.o. daily.  3. Welchol 625 mg tablets, 6 daily.  4. Zetia 10 mg p.o. daily.  5. Benazepril 40 mg p.o. daily.  6. Glucosamine.  7. Fish oil.  8. Folate.  9. Aleve as needed.  10.Ketoconazole as needed.   ALLERGIES:  No known drug allergies.   SOCIAL HISTORY:  He has a remote history of tobacco use but has not  smoked in approximately 20 years.  He rarely consumes alcohol.   FAMILY HISTORY:  Significant for aortic insufficiency in his sister.   PAST MEDICAL HISTORY:  1. Hypertension.  2. Hyperlipidemia.  3. Diabetes mellitus.  4. History of aortic insufficiency as described in the HPI.  5. Previous bladder tumors removed.  6. Previous back surgery, as well.  7. He has fractured his right ankle previously.   REVIEW OF  SYSTEMS:  He denies any headaches, fevers or chills.  There is  no productive cough or hemoptysis.  There is no dysphagia, odynophagia,  melena or hematochezia.  There is no dysuria or hematuria.  There is no  rash or seizure activity.  There is no orthopnea or PND, but there is  pedal edema.  There is no claudication.  He does have arthralgias and is  scheduled for right shoulder rotator cuff surgery.  The remaining  systems are negative.   PHYSICAL EXAMINATION TODAY:  VITAL SIGNS:  Blood pressure of 154/72,  pulse 58.  He weighs 242 pounds.  GENERAL:  He is well-developed, well-nourished, in no acute distress.  He does not appear to be depressed.  SKIN:  Warm and dry.  There is no peripheral clubbing.  BACK:  Normal.  HEENT:  Normal with normal eyelids.  NECK:  Supple with normal upstroke bilaterally.  I cannot appreciate any  thyromegaly.  He does have soft bilateral carotid bruits.  CHEST:  Clear to  auscultation with normal expansion.  CARDIOVASCULAR:  Regular rate and rhythm with a normal S1 and S2.  There  is a 2/6 to 3/6 diastolic murmur at the left sternal border.  There is  also a 1/6 systolic murmur.  There is no rub noted.  His PMI is  nondisplaced.  ABDOMEN:  Nontender, nondistended.  Positive bowel sounds.  No  hepatosplenomegaly.  No masses appreciated.  There is no abdominal  bruit.  He does have an umbilical hernia.  He has 2+ femoral pulses  bilaterally.  No bruits.  EXTREMITIES:  Trace to 1+ edema bilaterally.  I cannot palpate cords.  He has 2+ posterior tibial pulses bilaterally.  NEUROLOGIC:  Grossly intact.   ELECTROCARDIOGRAM:  A sinus bradycardia at a rate of 58.  There is a  first degree AV block noted.  There are minor nonspecific lateral T wave  changes.   DIAGNOSES:  1. Aortic insufficiency.  The patient clearly has an aortic      insufficiency murmur on examination.  He also has mild pedal edema.      We will schedule him to have an echocardiogram to  quantify his left      ventricular function and the severity of his aortic insufficiency.  2. Carotid bruits.  We will schedule him to have carotid Dopplers.  3. Hypertension.  His blood pressure is elevated today, and we will      need to control this, particularly in light of his aortic      insufficiency.  I have asked him to add HCTZ to his medical      regimen.  We will check a BMET in 1 week to follow his potassium      and renal function.  We will continue to adjust these as indicated.  4. Hyperlipidemia.  This is being managed by Dr. Alwyn Ren.  Given his      history of diabetes mellitus, he would most likely benefit from a      statin unless he has not tolerated these in the past.  I will leave      this to Dr. Frederik Pear discretion.  5. Preoperative evaluation prior to shoulder surgery.  The patient is      scheduled to have shoulder surgery in the near future.  Of note, he      is not having chest pain or marked shortness of breath.  He had a      Myoview less than 3 years ago that showed normal perfusion.  We      will therefore not pursue further ischemia evaluation      preoperatively.   NOTE:  It would be beneficial if the patient would take a baby aspirin  daily.   We will see him back in approximately 6 weeks to review his blood  pressure and echo, as well as his carotid Dopplers.     Madolyn Frieze [name], MD, [facility]  Electronically Signed    BSC/MedQ  DD: 09/08/2006  DT: 09/08/2006  Job #: [id number]   cc:   Titus Dubin. Alwyn Ren, MD,FACP,FCCP

## 2010-06-05 NOTE — Assessment & Plan Note (Signed)
OFFICE VISIT   [facility], William Faulkner  DOB:  10/05/1934                                        July 03, 2009  CHART #:  [id number]   HISTORY:  The patient is a 76 year old gentleman recently hospitalized,  at which time he underwent a coronary artery bypass grafting x5 on Jun 08, 2009, for severe three-vessel coronary artery disease with  exertional angina.  He is seen today in the office for routine  postoperative reevaluation.  Currently, he has multiple minor  complaints, but overall does feel as though he is making some progress.  He feels somewhat tired and weak but is ambulating several times  throughout the day approximately 15 minutes at a time.  He denies angina  symptoms or shortness of breath.  He has had minimal incision pain.  He  does complain of some lower extremity edema.  He also has complaints of  indigestion.   DIAGNOSTIC TESTS:  Chest x-ray obtained on today's date does reveal a  small-to-moderate left-sided pleural effusion.   PHYSICAL EXAMINATION:  Vital Signs:  Blood pressure is 134/64, pulse 78,  respirations 18, oxygen saturation is 96 on room air.  General:  This is  a well-developed adult male in no acute distress.  Pulmonary:  Slightly  diminished breath sounds in the left base.  Cardiac:  Regular rate and  rhythm.  Normal S1 and S2.  Extremities:  Bilateral lower extremity  edema, right greater than left.  Incisions are healing well without  evidence of infection.   ASSESSMENT:  The patient is making fairly routine recovery following his  surgical revascularization.  I did discuss several of his concerns.  In  regards to sleeping, I did relate that this is a fairly normal pattern  and he does have some difficulty with mild depression currently.  Again,  I have explained that this is usually a situational depression related  to the surgery and should improve over time.  I did tell him that if it  continues to be an issue, then  certainly we can investigate it further  and point him in the right direction regarding further treatment  options.  In regard to his indigestion, I have discussed trying some of  the oral over-the-counter medications as the first option.  Again if  this does not work, there are other treatments or testing that we could  be of assistance in conjunction with his primary physicians.  In regard  to his poor sleep pattern, I have discussed how he could possibly take a  smaller dose of the Ambien that was prescribed by Dr. Alwyn Ren, possibly  breaking it in half so he does not get the undesired effects.  In regard  to the lower extremity swelling, I provided a prescription for 7 days of  oral Lasix 40 mg and potassium 20 mEq and we will repeat a chest x-ray  next week and have him seen again in the office to reevaluate ongoing  need for further diuretics.  I have asked him to hold his  hydrochlorothiazide for 1 week while he is on the Lasix.   Rowe Clack, P.A.-C.   Sherryll Burger  D:  07/03/2009  T:  07/04/2009  Job:  [id number]   cc:   TCTS chart.  Madolyn Frieze [name], MD, [facility]  Titus Dubin. Alwyn Ren,  MD

## 2010-06-07 ENCOUNTER — Other Ambulatory Visit: Payer: Self-pay | Admitting: Internal Medicine

## 2010-06-08 NOTE — Assessment & Plan Note (Signed)
[facility]                               LIPID CLINIC NOTE   KEO, SCHIRMER                   MRN:          [id number]  DATE:02/13/2007                            DOB:          1934/11/14    [name] has called to discuss his lipid-lowering therapy.  He  states that he recently had to pick up his medications, and it was clear  that his current therapy of Welchol plus Zetia will cost more than $4000  a year alone, not including his other medications.  The patient requests  consideration of other therapy.  He states that in thinking back, he may  have overstated the muscle ache and pain that he experienced from his  medicines.  I spent about 25 minutes on the phone with Mr. and Mrs.  Golz this evening, and we have decided that we will rechallenge  with a statin on Avastin to try a lower dose 10 mg every other day for a  period of 3 weeks to determine if his muscle aches return.  We will also  at that time obtain a CK.  I will put samples at the desk for the  patient on Monday to get him through this 2-week trial period.  The  patient can discontinue his Welchol at this time.  We will hopefully be  able to switch him over and transition him so that he will not have to  take either of his 2 other medications, and we can only have him on  statin.  The patient understands, and he has my telephone number for  issues associated with muscle aches, weakness, pain, darkening of urine  or side of flank pain.   I appreciate the opportunity to work with this pleasant patient.  He  will call with questions or problems in the meantime.      Shelby Dubin, PharmD, BCPS, CPP  Electronically Signed      Rollene Rotunda, MD, [facility]  Electronically Signed   MP/MedQ  DD: 02/19/2007  DT: 02/19/2007  Job #: [id number]

## 2010-06-08 NOTE — Op Note (Signed)
[place]  Patient:    William Faulkner, William Faulkner Visit Number: [id number] MRN: [id number]          Service Type: SUR Location: NPAC 3172 07 Attending Physician:  Barton Fanny Dictated by:   Hewitt Shorts, M.D. Proc. Date: 03/23/01 Admit Date:  03/23/2001                             Operative Report  PREOPERATIVE DIAGNOSIS:  Right L4-5 lumbar disk herniation.  POSTOPERATIVE DIAGNOSIS:  Right L4-5 lumbar disk herniation.  OPERATION PERFORMED:  Right L4-5 lumbar laminotomy and microdiskectomy.  SURGEON:  Hewitt Shorts, M.D.  ASSISTANT:  Mena Goes. Franky Macho, M.D.  ANESTHESIA:  General endotracheal.  INDICATIONS FOR PROCEDURE:  The patient is a 76 year old man who presented with right lumbar radiculopathy and was found by myelogram and postmyelogram CT scan to have a right L4-5 lumbar disk herniation and decision was made to proceed with elective laminotomy and microdiskectomy.  DESCRIPTION OF PROCEDURE:  The patient was brought to the operating room and placed under general endotracheal anesthesia.  The patient was turned to a prone position.  Lumbar region was prepped with Betadine soap and solution and draped in sterile fashion.  The midline was infiltrated with local anesthetic with epinephrine.  A localizing x-ray was taken and the L4-5 level identified. A midline incision was made and carried down through the subcutaneous tissues. Bipolar cautery, electrocautery  were used to maintain hemostasis.  Dissection was carried down to the lumbar fascia which was incised on the right side of the midline.  The paraspinal muscles were dissected from the spinous process and lamina in subperiosteal fashion.  The L4-5 interlaminar space was identified and x-ray was taken to confirm the localization and then a laminotomy was performed using the [facility] Max drill and Kerrison punches.  The microscope was draped and brought into the field to  provide additional magnification, illumination and visualization.  The remainder of the procedure was performed using microdissection and microsurgical technique.  The ligamentum flavum was carefully removed and the thecal sac and right L5 nerve root were identified.  We gently retracted these structures medially exposing the disk herniation and we removed a moderately large fragment that was within the canal.  We then entered into the disk space and proceeded with thorough diskectomy removing all loose fragments of disk material from both the disk space and the epidural space and in the end, good decompression of the thecal sac and nerve root was achieved.  Once the diskectomy was completed, hemostasis was established with the use of bipolar cautery and then 2 cc of fentanyl and 80 mg of Depo-Medrol were infused into the epidural space.  We then proceeded with the wound closure.  The deep fascia was closed with interrupted undyed 0 Vicryl sutures.  The subcutaneous and subcuticular layer were closed with interrupted inverted 2-0 undyed Vicryl sutures and the skin edges were approximated with Dermabond.  The patient tolerated the procedure well.  Estimated blood loss for this procedure was 100 cc.  Sponge, needle and instrument counts were correct.  Following surgery, the patient was turned back to supine position to be reversed from anesthetic extubated and transferred to the recovery room for further care. Dictated by:   Hewitt Shorts, M.D. Attending Physician:  Barton Fanny DD:  03/23/01 TD:  03/23/01 Job: [id number]

## 2010-08-23 ENCOUNTER — Other Ambulatory Visit: Payer: Self-pay | Admitting: *Deleted

## 2010-08-23 MED ORDER — CARVEDILOL 12.5 MG PO TABS: ORAL_TABLET | ORAL | Status: DC

## 2010-08-25 ENCOUNTER — Other Ambulatory Visit: Payer: Self-pay | Admitting: Internal Medicine

## 2010-09-11 ENCOUNTER — Other Ambulatory Visit: Payer: Self-pay | Admitting: Internal Medicine

## 2010-09-12 ENCOUNTER — Other Ambulatory Visit (INDEPENDENT_AMBULATORY_CARE_PROVIDER_SITE_OTHER): Payer: 59

## 2010-09-12 ENCOUNTER — Other Ambulatory Visit: Payer: 59

## 2010-09-12 DIAGNOSIS — E1101 Type 2 diabetes mellitus with hyperosmolarity with coma: Secondary | ICD-10-CM

## 2010-09-13 NOTE — Progress Notes (Signed)
Labs only

## 2010-09-14 LAB — URINE CULTURE: Organism ID, Bacteria: NO GROWTH

## 2010-10-02 ENCOUNTER — Other Ambulatory Visit: Payer: Self-pay | Admitting: Cardiology

## 2010-10-02 DIAGNOSIS — I6529 Occlusion and stenosis of unspecified carotid artery: Secondary | ICD-10-CM

## 2010-10-03 ENCOUNTER — Encounter (INDEPENDENT_AMBULATORY_CARE_PROVIDER_SITE_OTHER): Payer: Medicare Other | Admitting: Cardiology

## 2010-10-03 DIAGNOSIS — I6529 Occlusion and stenosis of unspecified carotid artery: Secondary | ICD-10-CM

## 2010-10-11 LAB — BASIC METABOLIC PANEL
CO2: 31
Calcium: 9.9
Creatinine, Ser: 1.15
GFR calc Af Amer: >60
Sodium: 143

## 2010-10-11 LAB — URINALYSIS, ROUTINE W REFLEX MICROSCOPIC
Glucose, UA: NEGATIVE
Hgb urine dipstick: NEGATIVE
Protein, ur: NEGATIVE
Specific Gravity, Urine: 1.014
Urobilinogen, UA: 0.2

## 2010-10-11 LAB — HEMOGLOBIN AND HEMATOCRIT, BLOOD
HCT: 43.7
Hemoglobin: 14.8

## 2010-11-07 ENCOUNTER — Ambulatory Visit (INDEPENDENT_AMBULATORY_CARE_PROVIDER_SITE_OTHER): Payer: Medicare Other

## 2010-11-07 DIAGNOSIS — Z23 Encounter for immunization: Secondary | ICD-10-CM

## 2010-11-25 ENCOUNTER — Other Ambulatory Visit: Payer: Self-pay | Admitting: Internal Medicine

## 2010-11-27 NOTE — Telephone Encounter (Signed)
A1C 250.00 

## 2010-11-29 ENCOUNTER — Other Ambulatory Visit: Payer: Self-pay | Admitting: Internal Medicine

## 2010-11-29 DIAGNOSIS — E119 Type 2 diabetes mellitus without complications: Secondary | ICD-10-CM

## 2010-12-03 ENCOUNTER — Other Ambulatory Visit: Payer: Medicare Other

## 2010-12-10 ENCOUNTER — Other Ambulatory Visit: Payer: Medicare Other

## 2010-12-10 ENCOUNTER — Other Ambulatory Visit (INDEPENDENT_AMBULATORY_CARE_PROVIDER_SITE_OTHER): Payer: Medicare Other

## 2010-12-10 DIAGNOSIS — E119 Type 2 diabetes mellitus without complications: Secondary | ICD-10-CM

## 2010-12-10 LAB — HEMOGLOBIN A1C: Hgb A1c MFr Bld: 6.4 % (ref 4.6–6.5)

## 2010-12-17 ENCOUNTER — Other Ambulatory Visit: Payer: Self-pay | Admitting: Cardiology

## 2010-12-21 ENCOUNTER — Telehealth: Payer: Self-pay | Admitting: Cardiology

## 2010-12-21 MED ORDER — ROSUVASTATIN CALCIUM 20 MG PO TABS: 20.0000 mg | ORAL_TABLET | Freq: Every day | ORAL | Status: DC

## 2010-12-21 NOTE — Telephone Encounter (Signed)
Per pt spouse call she tried to call refill request into [facility] for crestor 20mg  and per pt they contacted [place] last week and we still have not responded and pt needs medication sent in please call please call [phone] and pt spouse would like to call when this is done please

## 2010-12-21 NOTE — Telephone Encounter (Signed)
Gave pt a call know answer so i left a message

## 2010-12-21 NOTE — Telephone Encounter (Signed)
Looks as if this med was called in on the 26th. Will call pt to futher discuss this issue. Will also send again to [facility]

## 2010-12-29 ENCOUNTER — Encounter: Payer: Self-pay | Admitting: Internal Medicine

## 2011-01-24 ENCOUNTER — Telehealth: Payer: Self-pay | Admitting: *Deleted

## 2011-01-24 DIAGNOSIS — I10 Essential (primary) hypertension: Secondary | ICD-10-CM

## 2011-01-24 DIAGNOSIS — IMO0001 Reserved for inherently not codable concepts without codable children: Secondary | ICD-10-CM

## 2011-01-24 NOTE — Telephone Encounter (Signed)
Left message for pt to call, it is time for the pt to have a CTA of chest to follow up thoracic aneurysm. He will need a bmp and a follow up with dr [name] after the CTA is done.

## 2011-01-25 ENCOUNTER — Ambulatory Visit (INDEPENDENT_AMBULATORY_CARE_PROVIDER_SITE_OTHER): Payer: 59 | Admitting: Internal Medicine

## 2011-01-25 ENCOUNTER — Encounter: Payer: Self-pay | Admitting: Internal Medicine

## 2011-01-25 VITALS — BP 124/70 | HR 59 | Temp 97.5°F | Wt 233.6 lb

## 2011-01-25 DIAGNOSIS — J209 Acute bronchitis, unspecified: Secondary | ICD-10-CM

## 2011-01-25 DIAGNOSIS — R21 Rash and other nonspecific skin eruption: Secondary | ICD-10-CM

## 2011-01-25 MED ORDER — HYDROCODONE-HOMATROPINE 5-1.5 MG/5ML PO SYRP: 5.0000 mL | ORAL_SOLUTION | Freq: Four times a day (QID) | ORAL | Status: AC | PRN

## 2011-01-25 MED ORDER — MOMETASONE FUROATE 0.1 % EX OINT: TOPICAL_OINTMENT | Freq: Two times a day (BID) | CUTANEOUS | Status: DC

## 2011-01-25 MED ORDER — AZITHROMYCIN 250 MG PO TABS: ORAL_TABLET | ORAL | Status: DC

## 2011-01-25 NOTE — Patient Instructions (Signed)
Plain Mucinex for thick secretions ;force NON dairy fluids. Use a Neti pot daily as needed for sinus congestion  

## 2011-01-25 NOTE — Progress Notes (Signed)
  Subjective:    Patient ID: William Faulkner, male    DOB: 1934/12/19, 77 y.o.   MRN: [id number]  HPI Respiratory tract infection Onset/symptoms:01/22/2011 as chilling, dry cough, malaise Exposures (illness/environmental/extrinsic):traveling , in [place] airport x 2 days Progression of symptoms:to green sputum Treatments/response:Robitussin & Tylenol with help Present symptoms: Fever/chills/sweats:no Frontal headache:no Facial pain:no Nasal purulence:no Sore throat:no Dental pain:no Lymphadenopathy:no Wheezing/shortness of breath:no Cough/sputum/hemoptysis:sputum now yellow Pleuritic pain:no Associated extrinsic/allergic symptoms:itchy eyes/ sneezing:no Past medical history: asthma:no Smoking history:quit 1987  RASH: Location: forearms Onset: 2 weeks ago  Course: progressive Self-treated with: cortisone helped            History Pruritis: yes, some  Tenderness: no  New medications/antibiotics: no   Recent travel:rash began  prior to trip New detergent, new clothing, or other topical exposure: no   Red Flags Feeling ill: no  Mouth lesions: no Facial/tongue swelling/difficulty breathing:  no (on ACE-I)             Review of Systems there's been no elevation of his fasting blood sugars with the present illness; FBS  was 119 this morning    Objective:   Physical Exam  HENT:  Head: Atraumatic.  Cardiovascular: Exam reveals gallop.     Above  PE entry erroneous; computer generated. General appearance:good health ;well nourished; no acute distress or increased work of breathing is present.  No  lymphadenopathy about the head, neck, or axilla noted.   Eyes: No conjunctival inflammation or lid edema is present. There is no scleral icterus.  Ears:  External ear exam shows no significant lesions or deformities.  Otoscopic examination reveals clear canals, tympanic membranes are intact bilaterally without bulging, retraction, inflammation or discharge.  Nose:   External nasal examination shows no deformity or inflammation. Nasal mucosa are pink and moist without lesions or exudates. No septal dislocation or deviation.No obstruction to airflow.   Oral exam: Dental hygiene is good; lips and gums are healthy appearing.There is no oropharyngeal erythema or exudate noted.    Heart:  Normal rate and regular rhythm. S1 and S2 normal without gallop,  click, rub . Grade 1.5-2 systolic and diastolic murmur at the base.Entry above in error; NO gallop Lungs:Chest clear to auscultation; no significant  wheezes, rhonchi,rales ,or rubs present.No increased work of breathing.    Extremities:  No cyanosis, edema, or clubbing  noted    Skin: Warm & dry ; raised plaque-like, dry and slightly erythematous  lesions of varying size on the forearms          Assessment & Plan:  #1 bronchitis, acute with purulent  secretions.  #2 urticarial-type rash limited to the forearms  Plan: See orders and recommendations

## 2011-01-28 ENCOUNTER — Telehealth: Payer: Self-pay | Admitting: Internal Medicine

## 2011-01-28 MED ORDER — MOMETASONE FUROATE 0.1 % EX OINT: TOPICAL_OINTMENT | Freq: Two times a day (BID) | CUTANEOUS | Status: DC

## 2011-01-28 MED ORDER — AZITHROMYCIN 250 MG PO TABS: ORAL_TABLET | ORAL | Status: AC

## 2011-01-28 NOTE — Telephone Encounter (Signed)
Dr.Hopper spoke with patient and his wife (patient was in office) rx's sent to [facility] in error. I sent rx's to the correct pharmacy [facility] [address]  I called [facility] and cancelled rx's.

## 2011-01-28 NOTE — Progress Notes (Signed)
Addended by: Maurice Small on: 01/28/2011 03:12 PM   Modules accepted: Orders

## 2011-01-28 NOTE — Telephone Encounter (Signed)
Patients wife states that when she went to pick up meds(z-pack and ointment)from [facility] last week that they were not there. Please resend. [facility] on [address]

## 2011-02-07 NOTE — Telephone Encounter (Signed)
Spoke with pt wife, CTA scheduled for 02-20-11. BMP will be checked on the 23rd. The pt was instructed to hold metformin the day of the CT and 48 hours after. One year follow up appt made

## 2011-02-13 ENCOUNTER — Ambulatory Visit: Payer: 59

## 2011-02-13 ENCOUNTER — Other Ambulatory Visit: Payer: 59

## 2011-02-13 DIAGNOSIS — I10 Essential (primary) hypertension: Secondary | ICD-10-CM

## 2011-02-13 LAB — BASIC METABOLIC PANEL
BUN: 27 mg/dL — ABNORMAL HIGH (ref 6–23)
CO2: 29 mEq/L (ref 19–32)
Chloride: 103 mEq/L (ref 96–112)
Glucose, Bld: 87 mg/dL (ref 70–99)
Potassium: 4.7 mEq/L (ref 3.5–5.1)

## 2011-02-20 ENCOUNTER — Ambulatory Visit (INDEPENDENT_AMBULATORY_CARE_PROVIDER_SITE_OTHER)
Admission: RE | Admit: 2011-02-20 | Discharge: 2011-02-20 | Disposition: A | Discharge status: Home / Self Care | Payer: Medicare Other | Source: Ambulatory Visit | Attending: Cardiology | Admitting: Cardiology

## 2011-02-20 DIAGNOSIS — I712 Thoracic aortic aneurysm, without rupture: Secondary | ICD-10-CM

## 2011-02-20 DIAGNOSIS — IMO0001 Reserved for inherently not codable concepts without codable children: Secondary | ICD-10-CM

## 2011-02-20 MED ORDER — IOHEXOL 300 MG/ML  SOLN
100.0000 mL | Freq: Once | INTRAMUSCULAR | Status: AC | PRN
  Administered 2011-02-20: 100 mL via INTRAVENOUS

## 2011-03-15 ENCOUNTER — Encounter: Payer: Self-pay | Admitting: *Deleted

## 2011-03-18 ENCOUNTER — Ambulatory Visit (INDEPENDENT_AMBULATORY_CARE_PROVIDER_SITE_OTHER): Payer: Medicare Other | Admitting: Cardiology

## 2011-03-18 ENCOUNTER — Other Ambulatory Visit: Payer: Self-pay | Admitting: Internal Medicine

## 2011-03-18 ENCOUNTER — Encounter: Payer: Self-pay | Admitting: Cardiology

## 2011-03-18 VITALS — BP 137/63 | HR 54 | Ht 74.0 in | Wt 233.0 lb

## 2011-03-18 DIAGNOSIS — I719 Aortic aneurysm of unspecified site, without rupture: Secondary | ICD-10-CM

## 2011-03-18 DIAGNOSIS — E785 Hyperlipidemia, unspecified: Secondary | ICD-10-CM

## 2011-03-18 DIAGNOSIS — I679 Cerebrovascular disease, unspecified: Secondary | ICD-10-CM

## 2011-03-18 DIAGNOSIS — I359 Nonrheumatic aortic valve disorder, unspecified: Secondary | ICD-10-CM

## 2011-03-18 DIAGNOSIS — I25810 Atherosclerosis of coronary artery bypass graft(s) without angina pectoris: Secondary | ICD-10-CM

## 2011-03-18 DIAGNOSIS — I10 Essential (primary) hypertension: Secondary | ICD-10-CM

## 2011-03-18 NOTE — Progress Notes (Signed)
HPI:William Faulkner is a pleasant gentleman who has a history of hypertension, aortic insufficiency, and cerebrovascular disease.  A cardiac catheterization in May of 2011 revealed severe three-vessel coronary artery disease and normal LV function. He subsequently underwent coronary artery bypass and graft in May of 2011(left internal mammary artery to left anterior descending artery, saphenous vein graft to first diagonal, saphenous  vein graft obtuse marginal 1, sequential saphenous vein graft to distal  right coronary and posterior descending artery). Note an intraoperative transesophageal echocardiogram showed mild to moderate aortic insufficiency and a dilated aortic root. However this was felt not to require surgical intervention at that time. He had his last echocardiogram in Dec 2011 showed normal LV function.  There was moderate aortic insufficiency and mild mitral regurgitation. The aortic root was moderately dilated. Last chest CT in Jan 2013 showed a dilated aortic root (increased in size from 4.2 to 4.6 cm). He also had carotid Dopplers last performed in Sept 2012.  He had a 40-59% right and 0- 39% left; chronically occluded right vertebral.  F/U recommended in 2 years.   I last saw him in Dec 2011. Since then, the patient denies any dyspnea on exertion, orthopnea, PND, pedal edema, palpitations, syncope or exertional chest pain.   Current Outpatient Prescriptions  Medication Sig Dispense Refill  . amoxicillin (AMOXIL) 500 MG capsule Take 500 mg by mouth. 4 pills 1 hour pre dental work       . aspirin 81 MG tablet Take 81 mg by mouth daily.        . benazepril (LOTENSIN) 40 MG tablet TAKE ONE TABLET BY MOUTH ONE TIME DAILY  90 tablet  1  . carvedilol (COREG) 12.5 MG tablet Take 1 two times daily then increase to 1 1/2 two times a day if B/P > 145/85 off amlodipine  180 tablet  3  . DHA-EPA-VIT B6-B12-FOLIC ACID PO Take by mouth as directed. 2 by mouth once daily      . metFORMIN (GLUCOPHAGE)  850 MG tablet TAKE ONE TABLET BY MOUTH TWICE DAILY WITH TWO LARGEST MEALS  180 tablet  1  . mometasone (ELOCON) 0.1 % ointment Apply topically 2 (two) times daily.  45 g  0  . Omega-3 Fatty Acids (FISH OIL) 1000 MG CAPS Take 1,000 mg by mouth 2 (two) times daily.        . rosuvastatin (CRESTOR) 20 MG tablet Take 1 tablet (20 mg total) by mouth daily.  90 tablet  2  . timolol (TIMOPTIC) 0.5 % ophthalmic solution Apply to eye. As directed         Past Medical History  Diagnosis Date  . Other and unspecified hyperlipidemia   . HYPERTENSION, ESSENTIAL NOS   . CEREBROVASCULAR DISEASE   . CAD, ARTERY BYPASS GRAFT   . AORTIC INSUFFICIENCY   . AORTIC ANEUR UNSPEC SITE WITHOUT MENTION RUPTURE   . SLEEP DISORDER   . NEOPLASM, MALIGNANT, BLADDER, HX OF   . NAIL DISORDER   . HYPERPLASIA PROSTATE UNS W/O UR OBST & OTH LUTS   . DIAB W/O COMP TYPE II/UNS NOT STATED UNCNTRL     Past Surgical History  Procedure Date  . Rotator cuff repair 2009  . Bladder tumor excision     Surgery x6, Dr.Tannebaum  . Back surgery   . Colonoscopy 2002/2004    Neg  . Lumbar epidural injection   . Coronary artery bypass graft   . Colonoscopy w/ polypectomy     History  Social History  . Marital Status: Married    Spouse Name: N/A    Number of Children: N/A  . Years of Education: N/A   Occupational History  . Not on file.   Social History Main Topics  . Smoking status: Former Smoker    Quit date: 01/21/1985  . Smokeless tobacco: Not on file  . Alcohol Use: No  . Drug Use: No  . Sexually Active: Not on file   Other Topics Concern  . Not on file   Social History Narrative  . No narrative on file    ROS: no fevers or chills, productive cough, hemoptysis, dysphasia, odynophagia, melena, hematochezia, dysuria, hematuria, rash, seizure activity, orthopnea, PND, pedal edema, claudication. Remaining systems are negative.  Physical Exam: Well-developed well-nourished in no acute distress.  Skin  is warm and dry.  HEENT is normal.  Neck is supple. No thyromegaly.  Chest is clear to auscultation with normal expansion.  Cardiovascular exam is regular rate and rhythm. 3/6 diastolic murmur left sternal border Abdominal exam nontender or distended. No masses palpated. Extremities show no edema. neuro grossly intact  ECG sinus rhythm at a rate of 54. First degree AV block. Right bundle branch block.

## 2011-03-18 NOTE — Assessment & Plan Note (Signed)
Patient's thoracic aorta increased in size from 4.2-4.6 cm over one year. Given the rate of growth I will plan to repeat his CTA in July of 2013.

## 2011-03-18 NOTE — Assessment & Plan Note (Signed)
Repeat echocardiogram. 

## 2011-03-18 NOTE — Assessment & Plan Note (Signed)
Blood pressure controlled. Continue present medications. 

## 2011-03-18 NOTE — Assessment & Plan Note (Signed)
Continue aspirin and statin. Followup carotid Dopplers September 2014. 

## 2011-03-18 NOTE — Assessment & Plan Note (Signed)
Continue statin. 

## 2011-03-18 NOTE — Patient Instructions (Signed)

## 2011-03-18 NOTE — Assessment & Plan Note (Signed)
Continue aspirin and statin. 

## 2011-04-01 ENCOUNTER — Ambulatory Visit (HOSPITAL_COMMUNITY): Payer: Medicare Other | Attending: Cardiology

## 2011-04-01 ENCOUNTER — Other Ambulatory Visit: Payer: Self-pay

## 2011-04-01 DIAGNOSIS — R011 Cardiac murmur, unspecified: Secondary | ICD-10-CM | POA: Insufficient documentation

## 2011-04-01 DIAGNOSIS — I359 Nonrheumatic aortic valve disorder, unspecified: Secondary | ICD-10-CM

## 2011-04-01 DIAGNOSIS — E785 Hyperlipidemia, unspecified: Secondary | ICD-10-CM | POA: Insufficient documentation

## 2011-04-01 DIAGNOSIS — I10 Essential (primary) hypertension: Secondary | ICD-10-CM | POA: Insufficient documentation

## 2011-04-01 DIAGNOSIS — E119 Type 2 diabetes mellitus without complications: Secondary | ICD-10-CM | POA: Insufficient documentation

## 2011-05-06 ENCOUNTER — Ambulatory Visit (INDEPENDENT_AMBULATORY_CARE_PROVIDER_SITE_OTHER): Payer: Medicare Other | Admitting: Internal Medicine

## 2011-05-06 ENCOUNTER — Encounter: Payer: Self-pay | Admitting: Internal Medicine

## 2011-05-06 VITALS — BP 136/70 | HR 60 | Temp 97.7°F | Wt 233.0 lb

## 2011-05-06 DIAGNOSIS — N400 Enlarged prostate without lower urinary tract symptoms: Secondary | ICD-10-CM

## 2011-05-06 DIAGNOSIS — T887XXA Unspecified adverse effect of drug or medicament, initial encounter: Secondary | ICD-10-CM

## 2011-05-06 DIAGNOSIS — J209 Acute bronchitis, unspecified: Secondary | ICD-10-CM

## 2011-05-06 DIAGNOSIS — Z8551 Personal history of malignant neoplasm of bladder: Secondary | ICD-10-CM

## 2011-05-06 MED ORDER — AZITHROMYCIN 250 MG PO TABS: ORAL_TABLET | ORAL | Status: AC

## 2011-05-06 NOTE — Assessment & Plan Note (Signed)
Dr. Marcello Fennel  has released him from followup; last PSA was 1.35. The significance of such a low level of his age was discussed.

## 2011-05-06 NOTE — Patient Instructions (Signed)
Plain Mucinex for thick secretions ;force NON dairy fluids . Use a Neti pot daily as needed for sinus congestion. Nasal cleansing in the shower as discussed. Make sure that all residual soap is removed to prevent irritation.  

## 2011-05-06 NOTE — Progress Notes (Signed)
  Subjective:    Patient ID: William Faulkner, male    DOB: 07-05-1934, 77 y.o.   MRN: [id number]  HPI Respiratory tract infection Onset/symptoms:04/23/11 as abd  & back pain, rhinitis, chills in [place] Exposures (illness/environmental/extrinsic):grand daughter ill Progression of symptoms:to cough Treatments/response:Robitussin , Coricidin HP with some benefit Present symptoms: Fever/chills/sweats:intermittent chilling Frontal headache:no; he had severe baro pressure symptoms flying back from [place] Facial pain:no Nasal purulence:no Sore throat:no Dental pain:no Lymphadenopathy:no Wheezing/shortness of breath:no Cough/sputum/hemoptysis:mainly dry but occasional yellow Pleuritic pain:no Associated extrinsic/allergic symptoms:itchy eyes/ sneezing:no Past medical history: Seasonal allergies: no/asthma:no Smoking history:quit 1987          Review of Systems He's had some constipation but no diarrhea. He denies dysuria, pyuria, or hematuria.  Fasting blood sugars have ranged in the 120s to 130s with this present illness.     Objective:   Physical Exam General appearance:good health ;well nourished; no acute distress or increased work of breathing is present.  No  lymphadenopathy about the head, neck, or axilla noted.   Eyes: No conjunctival inflammation or lid edema is present. .  Ears:  External ear exam shows no significant lesions or deformities.  Otoscopic examination reveals clear canals, tympanic membranes are intact bilaterally without bulging, retraction, inflammation or discharge.  Nose:  External nasal examination shows no deformity or inflammation. Nasal mucosa are pink and moist without lesions or exudates. No septal dislocation or deviation.No obstruction to airflow.   Oral exam: Dental hygiene is good; lips and gums are healthy appearing.There is no oropharyngeal erythema or exudate noted.   Neck:  No deformities, thyromegaly, masses, or tenderness  noted.   Supple with full range of motion without pain.   Heart:  Normal rate and regular rhythm. S1 and S2 normal without gallop,  click, rub . Diastolic murmur Lungs:Chest clear to auscultation; no wheezes, rhonchi,rales ,or rubs present.No increased work of breathing.    Extremities:  No cyanosis, edema, or clubbing  noted    Skin: Warm & dry         Assessment & Plan:  #1 acute bronchitis w/o bronchospasm  #2 past history of bladder cancer, cured  #3 history of prostatic hypertrophy; PSA is excellent for his age. No further monitor recommended based on standards which were discussed Plan: See orders and recommendations

## 2011-05-13 ENCOUNTER — Telehealth: Payer: Self-pay | Admitting: Internal Medicine

## 2011-05-13 DIAGNOSIS — J40 Bronchitis, not specified as acute or chronic: Secondary | ICD-10-CM

## 2011-05-13 MED ORDER — CEFUROXIME AXETIL 500 MG PO TABS: 500.0000 mg | ORAL_TABLET | Freq: Two times a day (BID) | ORAL | Status: AC

## 2011-05-13 NOTE — Telephone Encounter (Signed)
Husband no better from last OV  dx Bronchitis, acute   - Primary    466.0  Wife Flora, called & states patient is no better, still has fatigue and coughing up mucus. She would like to know if Alwyn Ren will call in an antibiotic as the patient has finished the Z-pak. Flora's # [phone]

## 2011-05-13 NOTE — Telephone Encounter (Signed)
Generic cefuroxime,500 milligrams twice a day, dispense 14. Chest x-ray either at [facility]  or [facility]

## 2011-05-13 NOTE — Telephone Encounter (Signed)
Rx sent 

## 2011-05-14 ENCOUNTER — Ambulatory Visit (INDEPENDENT_AMBULATORY_CARE_PROVIDER_SITE_OTHER)
Admission: RE | Admit: 2011-05-14 | Discharge: 2011-05-14 | Disposition: A | Discharge status: Home / Self Care | Payer: Medicare Other | Source: Ambulatory Visit | Attending: Internal Medicine | Admitting: Internal Medicine

## 2011-05-14 DIAGNOSIS — J40 Bronchitis, not specified as acute or chronic: Secondary | ICD-10-CM

## 2011-05-15 ENCOUNTER — Telehealth: Payer: Self-pay | Admitting: Internal Medicine

## 2011-05-15 NOTE — Telephone Encounter (Signed)
Please address xray to be discussed with patient

## 2011-05-15 NOTE — Telephone Encounter (Signed)
Patients wife William Faulkner, states William Faulkner told her we would have the results back yesterday from her husbands X-Ray. Please call William Faulkner with the results  Thanks Ph [phone]

## 2011-05-17 NOTE — Telephone Encounter (Signed)
Left message on voicemail informing patient's wife of results. Patient to call if he would like to pursue referral to pulmonary

## 2011-05-17 NOTE — Telephone Encounter (Signed)
   Chest x-ray is normal. If symptoms persist or progress additional evaluation would be indicated such as pulmonary function tests

## 2011-05-18 ENCOUNTER — Other Ambulatory Visit: Payer: Self-pay | Admitting: Internal Medicine

## 2011-05-18 DIAGNOSIS — J209 Acute bronchitis, unspecified: Secondary | ICD-10-CM

## 2011-06-03 ENCOUNTER — Encounter: Payer: Self-pay | Admitting: Internal Medicine

## 2011-06-03 ENCOUNTER — Ambulatory Visit (INDEPENDENT_AMBULATORY_CARE_PROVIDER_SITE_OTHER): Payer: Medicare Other | Admitting: Internal Medicine

## 2011-06-03 VITALS — BP 120/60 | HR 55 | Temp 97.7°F | Ht 74.0 in | Wt 234.0 lb

## 2011-06-03 DIAGNOSIS — I10 Essential (primary) hypertension: Secondary | ICD-10-CM

## 2011-06-03 DIAGNOSIS — R05 Cough: Secondary | ICD-10-CM

## 2011-06-03 MED ORDER — OLMESARTAN MEDOXOMIL 40 MG PO TABS: 40.0000 mg | ORAL_TABLET | Freq: Every day | ORAL | Status: DC

## 2011-06-03 NOTE — Patient Instructions (Signed)
Stop lotensin and replace with benicar 40 and call me if not happy with your blood pressure or if not 100% satisfied by the end of your samples  GERD (REFLUX)  is an extremely common cause of respiratory symptoms, many times with no significant heartburn at all.    It can be treated with medication, but also with lifestyle changes including avoidance of late meals, excessive alcohol, smoking cessation, and avoid fatty foods, chocolate, peppermint, colas, red wine, and acidic juices such as orange juice.  NO MINT OR MENTHOL PRODUCTS SO NO COUGH DROPS  USE SUGARLESS CANDY INSTEAD (jolley ranchers or Stover's)  NO OIL BASED VITAMINS - use powdered substitutes. (Hold fish oil until no longer coughing at all)   Pulmonary follow up can be as needed

## 2011-06-03 NOTE — Progress Notes (Signed)
  Subjective:    Patient ID: William Faulkner, male    DOB: Jan 29, 1934 MRN: [id number]  HPI  31 yowm quit smoking 1987 doing fine with no allergies or asthma and well until 2010-2011 with freq episodes of severe  cough since then referred to pulmonary clinic 06/03/11 by Dr Marga Melnick.  06/03/2011 1st pulmonary cc severe cough x2-3y while on ACEI  variably productive of minimal beige mucus but so severe generates abd wall and lowever pelvis pain esp severe episode  p commercial air travel in Jan 2013 still with sense of chest congestion but much better p abx and cough suppression, just not back to baseline yet.  In between bouts he denies any sob and no assoc sneezing or nasal complaints x  Perhaps at very onset of these coughs.     Review of Systems  Constitutional: Negative for fever, chills, activity change, appetite change and unexpected weight change.  HENT: Positive for sneezing and postnasal drip. Negative for congestion, sore throat, rhinorrhea, trouble swallowing, dental problem and voice change.   Eyes: Negative for visual disturbance.  Respiratory: Positive for cough. Negative for choking and shortness of breath.   Cardiovascular: Negative for chest pain and leg swelling.  Gastrointestinal: Positive for abdominal pain. Negative for nausea and vomiting.  Genitourinary: Negative for difficulty urinating.  Musculoskeletal: Negative for arthralgias.  Skin: Negative for rash.  Psychiatric/Behavioral: Negative for behavioral problems and confusion.       Objective:   Physical Exam  amb wm nad with classic voice throat fatigue and throat clearling Wt 234 06/03/2011  HEENT: nl dentition, turbinates, and orophanx. Nl external ear canals without cough reflex   NECK :  without JVD/Nodes/TM/ nl carotid upstrokes bilaterally   LUNGS: no acc muscle use, clear to A and P bilaterally without cough on insp or exp maneuvers   CV:  RRR  no s3 or murmur or increase in P2, no edema    ABD:  soft and nontender with nl excursion in the supine position. No bruits or organomegaly, bowel sounds nl  MS:  warm without deformities, calf tenderness, cyanosis or clubbing  SKIN: warm and dry without lesions    NEURO:  alert, approp, no deficits    cxr 05/16/11  Sequelae of CABG. Stable cardiomegaly and mediastinal  contours. Visualized tracheal air column is within normal limits.  Stable lung volumes. No pneumothorax, pulmonary edema, pleural  effusion or acute pulmonary opacity. Stable visualized osseous  structures. Conjoined right first and second ribs.  IMPRESSION:  No acute cardiopulmonary abnormality     Assessment & Plan:

## 2011-06-04 NOTE — Assessment & Plan Note (Signed)
The most common causes of chronic cough in immunocompetent adults include the following: upper airway cough syndrome (UACS), previously referred to as postnasal drip syndrome (PNDS), which is caused by variety of rhinosinus conditions; (2) asthma; (3) GERD; (4) chronic bronchitis from cigarette smoking or other inhaled environmental irritants; (5) nonasthmatic eosinophilic bronchitis; and (6) bronchiectasis.   These conditions, singly or in combination, have accounted for up to 94% of the causes of chronic cough in prospective studies.   Other conditions have constituted no >6% of the causes in prospective studies These have included bronchogenic carcinoma, chronic interstitial pneumonia, sarcoidosis, left ventricular failure, ACEI-induced cough, and aspiration from a condition associated with pharyngeal dysfunction.   This cough is most likley triggered by viral infections but because of the ACEI effect on upper airway bradykinins the cough is violent and prolonged and may be generating secondary GERD.  The only way to know is try him off the ACEI and see what happens next time he has a URI.  If not change, primary gerd or cough variant asthma need to be considered

## 2011-06-04 NOTE — Assessment & Plan Note (Signed)
ACE inhibitors are problematic in  pts with airway complaints because  even experienced pulmonologists can't always distinguish ace effects from copd/asthma/pnds/ allergies etc.  By themselves they don't actually cause a problem, much like oxygen can't by itself start a fire, but they certainly serve as a powerful catalyst or enhancer for any "fire"  or inflammatory process in the upper airway, be it caused by an ET  tube or more commonly reflux (especially in the obese or pts with known GERD or who are on biphoshonates) or URI's, due to interference with bradykinin clearance.  The effects of acei on bradykinin levels occurs in 100% of pt's on acei (unless they surreptitiously stop the med!) but the classic cough is only reported in 5%.  This leaves 95% of pts on acei's  with a variety of syndromes including no identifiable symptom in most  vs non-specific symptoms that wax and wane depending on what other insult is occuring at the level of the upper airway.  His cough comes and goes with URI but then has lingering hoarseness and throat clearing which may well be bradykinin related > the only way to know is stop the ace and replace with ARB in his case probably at least through the next URI cycle.

## 2011-06-06 ENCOUNTER — Other Ambulatory Visit: Payer: Self-pay | Admitting: Internal Medicine

## 2011-06-08 ENCOUNTER — Other Ambulatory Visit: Payer: Self-pay | Admitting: Internal Medicine

## 2011-06-12 ENCOUNTER — Institutional Professional Consult (permissible substitution): Payer: Medicare Other | Admitting: Pulmonary Disease

## 2011-06-18 ENCOUNTER — Encounter: Payer: Self-pay | Admitting: Internal Medicine

## 2011-07-08 ENCOUNTER — Ambulatory Visit (INDEPENDENT_AMBULATORY_CARE_PROVIDER_SITE_OTHER): Payer: Medicare Other | Admitting: Internal Medicine

## 2011-07-08 VITALS — BP 118/52 | HR 61 | Temp 97.7°F | Wt 232.0 lb

## 2011-07-08 DIAGNOSIS — I10 Essential (primary) hypertension: Secondary | ICD-10-CM

## 2011-07-08 DIAGNOSIS — R609 Edema, unspecified: Secondary | ICD-10-CM

## 2011-07-08 DIAGNOSIS — K4090 Unilateral inguinal hernia, without obstruction or gangrene, not specified as recurrent: Secondary | ICD-10-CM

## 2011-07-08 DIAGNOSIS — K429 Umbilical hernia without obstruction or gangrene: Secondary | ICD-10-CM

## 2011-07-08 NOTE — Patient Instructions (Addendum)
Avoid heavy lifting because of  the hernia. Elective surgery can be scheduled whenever you would want to proceed. Please be seen immediately if there is persistent  severe pain in this area.  Blood Pressure Goal  Ideally is an AVERAGE < 135/85. This AVERAGE should be calculated from @ least 5-7 BP readings taken @ different times of day on different days of week. You should not respond to isolated BP readings , but rather the AVERAGE for that week

## 2011-07-08 NOTE — Progress Notes (Signed)
Subjective:    Patient ID: William Faulkner, male    DOB: Mar 15, 1934, 77 y.o.   MRN: [id number]  HPI His ACE inhibitor was discontinued because of the persistent cough. It did get better but he believes Benicar was causing ankle edema. He stopped the Benicar and went back to taking an ACE inhibitor but now is frequently clearing his throat. The edema did improve off the angiotensin receptor blocker.  In April while traveling in [place]; he experienced some discomfort in the right inguinal area with sneezing & coughing. He now intermittently has actually noted a lump in this area.      Review of Systems CHRONIC HYPERTENSION: Disease Monitoring  Blood pressure range: 120s/50-60s  Chest pain: no   Dyspnea: no   Claudication: mild discomfort   Medication compliance: yes Medication Side Effects  Lightheadedness: no   Preventitive Healthcare:  Exercise: walking 1-2X/week for up to 60 min  Diet Pattern: low carb  Salt Restriction: yes    He was also told to stop taking his fish oil as there was a question of possible reflux related to that contributing to the cough.  He is not having fever, chills, sweats, abdominal pain,unexplained weight loss, melena, rectal bleeding, hematuria, pyuria, or dysuria.     Objective:   Physical Exam Gen.: Healthy and well-nourished in appearance. Alert, appropriate and cooperative throughout exam. Appears younger than stated age  Eyes: No corneal or conjunctival inflammation noted.  Neck: No deformities, masses, or tenderness noted.  Lungs: Normal respiratory effort; chest expands symmetrically. Lungs are clear to auscultation without rales, wheezes, or increased work of breathing. Heart: Normal rate and rhythm. Normal S1 and S2. No gallop, click, or rub. Grade 1.5 /6 aortic stenosis/aortic insufficiency murmur at the right base with carotid radiation Abdomen: Bowel sounds normal; abdomen soft and nontender. No masses, organomegaly .Small  umbilical and large partially reducible right inguinal hernias noted. Genitalia: normal   .                                                                                   Musculoskeletal/extremities:  No clubbing, cyanosis,  or deformity noted. 1/2+ edema at the sock line. Nail health  good. Vascular: Carotid, radial artery, dorsalis pedis and  posterior tibial pulses are full and equal.  Neurologic: Alert and oriented x3.           Skin: Intact without suspicious lesions or rashes. Lymph: No cervical, axillary, or inguinal lymphadenopathy present. Psych: Mood and affect are normal. Normally interactive                                                                                         Assessment & Plan:  #1 hypertension, excellent control in the ACE inhibitor. If the airway irritation becomes excessive; the ACE inhibitor should be changed. By  history he had worsening edema or an angiotensin receptor blocker.  #2 edema, mild  #3 significant right inguinal hernia, partially reducible  #4 small umbilical hernia  Plan: Options, risks, and benefits discussed. Surgery would be elective ; Cardiology clearance would be required prior to elective surgery.

## 2011-07-17 ENCOUNTER — Encounter: Payer: Self-pay | Admitting: Internal Medicine

## 2011-07-17 ENCOUNTER — Telehealth: Payer: Self-pay | Admitting: Internal Medicine

## 2011-07-17 ENCOUNTER — Ambulatory Visit (INDEPENDENT_AMBULATORY_CARE_PROVIDER_SITE_OTHER): Payer: Medicare Other | Admitting: Internal Medicine

## 2011-07-17 VITALS — BP 138/60 | HR 60 | Temp 97.6°F | Wt 232.0 lb

## 2011-07-17 DIAGNOSIS — B379 Candidiasis, unspecified: Secondary | ICD-10-CM

## 2011-07-17 DIAGNOSIS — K4090 Unilateral inguinal hernia, without obstruction or gangrene, not specified as recurrent: Secondary | ICD-10-CM

## 2011-07-17 MED ORDER — KETOCONAZOLE 2 % EX CREA: TOPICAL_CREAM | Freq: Two times a day (BID) | CUTANEOUS | Status: AC

## 2011-07-17 NOTE — Telephone Encounter (Signed)
Caller: Flora/Spouse; PCP: Marga Melnick; CB#: [phone];  Call regarding Inguinal Hernia, Red Mark Coming Down Thigh From Area;  Onset: 07/17/11.  Afebrile. Reports painless semicicular hand sized , red mark on thigh and lower pelvic area.  FBS 120. Occasional "pulling sensation"  in testicle, bulge present at inguinal hernia. Advised to see MD within 4 hrs for new signs and symptoms of local infection per Scrotum or Testicles Symptoms Guideline. Appt scheduled for 07/17/11 at 1415 with Dr Alwyn Ren.

## 2011-07-17 NOTE — Telephone Encounter (Signed)
Patient's wife flora stated during last visit patient was diagnosed with a hernia in groin and has elected to go ahead with surgery. Below is copied from patient visit notes dated 6.17.13  Elective surgery can be scheduled whenever you would want to proceed  Please enter referral call Flora back at [phone]

## 2011-07-17 NOTE — Progress Notes (Signed)
  Subjective:    Patient ID: William Faulkner, male    DOB: [birth year], 77 y.o.   MRN: [id number]  HPI While showering today he noticed an oblique red skin lesion in the area of the right inguinal crease/thigh. He is not had fever, chills, or sweats. He has noted increased sweating this area.  His fasting blood sugars have ranged from a low of 117 to a high of 127.    Review of Systems He notes some persistent swelling at the site of hernia since he sneezed last night after the evening meal.  On his ACE inhibitor; he is not having a cough. Blood pressure has been controlled    Objective:   Physical Exam  He is healthy and well-nourished in appearance; he appears younger than stated age.  There is a large nonreducible right inguinal hernia. Testicular exams unremarkable. He  He has no lymphadenopathy in the inguinal areas.  There is diffuse erythema in the right inguinal area; clinically candidiasis is suggested.         Assessment & Plan:  #1 Candidiasis #2 henia, non reducible Plan: See orders and recommendations

## 2011-07-17 NOTE — Telephone Encounter (Signed)
Dr.Hopper please advise 

## 2011-07-29 ENCOUNTER — Ambulatory Visit: Payer: Medicare Other | Admitting: Internal Medicine

## 2011-08-20 ENCOUNTER — Other Ambulatory Visit: Payer: Self-pay | Admitting: Cardiology

## 2011-08-26 ENCOUNTER — Encounter (INDEPENDENT_AMBULATORY_CARE_PROVIDER_SITE_OTHER): Payer: Self-pay | Admitting: Surgery

## 2011-08-26 ENCOUNTER — Ambulatory Visit (INDEPENDENT_AMBULATORY_CARE_PROVIDER_SITE_OTHER): Payer: Medicare Other | Admitting: Surgery

## 2011-08-26 VITALS — BP 130/68 | HR 70 | Temp 97.6°F | Resp 14 | Ht 74.5 in | Wt 234.2 lb

## 2011-08-26 DIAGNOSIS — K429 Umbilical hernia without obstruction or gangrene: Secondary | ICD-10-CM

## 2011-08-26 DIAGNOSIS — K4090 Unilateral inguinal hernia, without obstruction or gangrene, not specified as recurrent: Secondary | ICD-10-CM

## 2011-08-26 NOTE — Progress Notes (Signed)
General Surgery [facility]  Chief Complaint  Patient presents with  . Inguinal Hernia    referral from Dr. Marga Melnick    HISTORY: Patient is a 77 year old white male referred by his primary care physician with right inguinal hernia. Patient first developed symptoms in January 2013. After significant walking, the patient noted discomfort in the lower abdomen. He developed a bulge on the right side. This has always been reducible. He denies any signs or symptoms of obstruction. He has had no prior hernia repairs. Of note, the patient also has an umbilical hernia which has been present for several years and gradually increasing in size. It does not cause discomfort. Patient has had no prior abdominal surgery. He presents today for consideration for operative repair.  Past Medical History  Diagnosis Date  . Other and unspecified hyperlipidemia   . HYPERTENSION, ESSENTIAL NOS   . CEREBROVASCULAR DISEASE   . CAD, ARTERY BYPASS GRAFT   . AORTIC INSUFFICIENCY   . AORTIC ANEUR UNSPEC SITE WITHOUT MENTION RUPTURE   . SLEEP DISORDER   . NEOPLASM, MALIGNANT, BLADDER, HX OF   . NAIL DISORDER   . HYPERPLASIA PROSTATE UNS W/O UR OBST & OTH LUTS   . DIAB W/O COMP TYPE II/UNS NOT STATED UNCNTRL      Current Outpatient Prescriptions  Medication Sig Dispense Refill  . benazepril (LOTENSIN) 40 MG tablet Take 40 mg by mouth daily.      Marland Kitchen amoxicillin (AMOXIL) 500 MG capsule Take 500 mg by mouth. 4 pills 1 hour pre dental work       . aspirin 81 MG tablet Take 81 mg by mouth daily.        . carvedilol (COREG) 12.5 MG tablet TAKE 1 TAB BY MOUTH TWO TIMES DAILY,INCREASE TO 1 & 1/2 TWO TIMES ADAY IF B/P > 145/85 OFF AMLODIPINE  180 tablet  2  . DHA-EPA-VIT B6-B12-FOLIC ACID PO Take by mouth as directed. 2 by mouth once daily      . ketoconazole (NIZORAL) 2 % cream Apply topically 2 (two) times daily.  60 g  0  . metFORMIN (GLUCOPHAGE) 850 MG tablet TAKE ONE TABLET BY MOUTH TWICE  DAILY WITH TWO LARGEST MEALS  180 tablet  0  . Omega-3 Fatty Acids (FISH OIL) 1000 MG CAPS Take 1,000 mg by mouth 2 (two) times daily.        . rosuvastatin (CRESTOR) 20 MG tablet Take 1 tablet (20 mg total) by mouth daily.  90 tablet  2  . timolol (TIMOPTIC) 0.5 % ophthalmic solution Apply to eye. As directed         Allergies  Allergen Reactions  . Atorvastatin     REACTION: increased cpk  . Celecoxib     REACTION: palpitations  . Sulfonamide Derivatives     No rash or fever  . Zolpidem Tartrate     REACTION: made patient feel wierd     Family History  Problem Relation Age of Onset  . Diabetes Mother   . Tremor Father      History   Social History  . Marital Status: Married    Spouse Name: N/A    Number of Children: N/A  . Years of Education: N/A   Social History Main Topics  . Smoking status: Former Smoker -- 0.3 packs/day for 15 years    Types: Cigarettes    Quit date: 01/22/1984  . Smokeless tobacco: Never Used  . Alcohol Use: No  . Drug  Use: No  . Sexually Active: None   Other Topics Concern  . None   Social History Narrative  . None     REVIEW OF SYSTEMS - PERTINENT POSITIVES ONLY: No signs or symptoms of intestinal obstruction, no significant pain, always reducible.  EXAM: Filed Vitals:   08/26/11 1401  BP: 130/68  Pulse: 70  Temp: 97.6 F (36.4 C)  Resp: 14    HEENT: normocephalic; pupils equal and reactive; sclerae clear; dentition good; mucous membranes moist NECK:  symmetric on extension; no palpable anterior or posterior cervical lymphadenopathy; no supraclavicular masses; no tenderness CHEST: clear to auscultation bilaterally without rales, rhonchi, or wheezes CARDIAC: regular rate and rhythm without significant murmur; peripheral pulses are full ABDOMEN: soft without distension; bowel sounds present; no mass; no hepatosplenomegaly; obvious umbilical hernia, reducible, the fascial defect approximately 1.5 cm in size GU:  Normal male  without lesion; obvious bulge right groin. Palpation shows a moderate-sized inguinal hernia which is reducible and nontender. It augments with cough and Valsalva. On the left side there is no evidence of hernia with cough and Valsalva. EXT:  non-tender without edema; no deformity NEURO: no gross focal deficits; no sign of tremor   LABORATORY RESULTS: See [facility] (CHL-Epic) for most recent results   RADIOLOGY RESULTS: See [facility] (CHL-Epic) for most recent results   IMPRESSION: #1 reducible right inguinal hernia, moderate #2 umbilical hernia, reducible  PLAN: The patient and I discussed the indications for repair. We discussed the use of prosthetic mesh. We discussed concurrent repair of his umbilical hernia and his right inguinal hernia. We discussed restrictions on his activities following the procedure. Given his cardiac history, I believe we can perform his surgery as an outpatient procedure in the main operating room at the hospital. We will have him evaluated preoperatively by anesthesiology.  The risks and benefits of the procedure have been discussed at length with the patient.  The patient understands the proposed procedure, potential alternative treatments, and the course of recovery to be expected.  All of the patient's questions have been answered at this time.  The patient wishes to proceed with surgery.  Velora Heckler, MD, FACS General & Endocrine Surgery [facility]   Visit Diagnoses: 1. Inguinal hernia unilateral, non-recurrent, right   2. Umbilical hernia, reducible     Primary Care Physician: Marga Melnick, MD

## 2011-08-26 NOTE — Patient Instructions (Signed)
[facility]  HERNIA REPAIR POST OP INSTRUCTIONS  Always review your discharge instruction sheet given to you by the facility where your surgery was performed.  1. A  prescription for pain medication may be given to you upon discharge.  Take your pain medication as prescribed.  If narcotic pain medicine is not needed, then you may take acetaminophen (Tylenol) or ibuprofen (Advil) as needed. 2. Take your usually prescribed medications unless otherwise directed. 3. If you need a refill on your pain medication, please contact your pharmacy.  They will contact our office to request authorization. Prescriptions will not be filled after 5 pm daily or on weekends. 4. You should follow a light diet the first 24 hours after arrival home, such as soup and crackers or toast.  Be sure to include plenty of fluids daily.  Resume your normal diet the day after surgery. 5. Most patients will experience some swelling and bruising around the surgical site.  Ice packs and reclining will help.  Swelling and bruising can take several days to resolve.  6. It is common to experience some constipation if taking pain medication after surgery.  Increasing fluid intake and taking a stool softener (such as Colace) will usually help or prevent this problem from occurring.  A mild laxative (Milk of Magnesia or Miralax) should be taken according to package directions if there are no bowel movements after 48 hours. 7. Unless discharge instructions indicate otherwise, you may remove your bandages 24-48 hours after surgery, and you may shower at that time.  You may have steri-strips (small skin tapes) in place directly over the incision.  These strips should be left on the skin for 7-10 days.  If your surgeon used skin glue on the incision, you may shower in 24 hours.  The glue will flake off over the next 2-3 weeks.  Any sutures or staples will be removed at the office during your follow-up visit. 8. ACTIVITIES:  You  may resume regular (light) daily activities beginning the next day-such as daily self-care, walking, climbing stairs-gradually increasing activities as tolerated.  You may have sexual intercourse when it is comfortable.  Refrain from any heavy lifting or straining until approved by your doctor.  You may drive when you are no longer taking prescription pain medication, you can comfortably wear a seatbelt, and you can safely maneuver your car and apply brakes. 9. You should see your doctor in the office for a follow-up appointment approximately 2-3 weeks after your surgery.  Make sure that you call for this appointment within a day or two after you arrive home to insure a convenient appointment time.  WHEN TO CALL YOUR DOCTOR: 1. Fever greater than 101.0 2. Inability to urinate 3. Persistent nausea and/or vomiting 4. Extreme swelling or bruising 5. Continued bleeding from incision 6. Increased pain, redness, or drainage from the incision  The clinic staff is available to answer your questions during regular business hours.  Please don't hesitate to call and ask to speak to one of the nurses for clinical concerns.  If you have a medical emergency, go to the nearest emergency room or call 911.  A surgeon from [facility] is always on call for the hospital.   [facility] [address], [address], [place], [place]  [address]  [phone] ? [phone] ? FAX [phone] [email or web address]   

## 2011-08-28 ENCOUNTER — Telehealth (INDEPENDENT_AMBULATORY_CARE_PROVIDER_SITE_OTHER): Payer: Self-pay

## 2011-08-28 NOTE — Telephone Encounter (Signed)
Cardiac clearance here and orders to surgery schedulers to set up surg date.

## 2011-09-05 ENCOUNTER — Telehealth: Payer: Self-pay | Admitting: Cardiology

## 2011-09-05 DIAGNOSIS — IMO0001 Reserved for inherently not codable concepts without codable children: Secondary | ICD-10-CM

## 2011-09-05 DIAGNOSIS — Z01810 Encounter for preprocedural cardiovascular examination: Secondary | ICD-10-CM

## 2011-09-05 NOTE — Telephone Encounter (Signed)
Per pt spouse she only wants to talk to [facility] Kidney but she is very concerned about pt's letter of clearance and his echo he is suppose to be now because he is to have it every 6 months now per last OV

## 2011-09-10 ENCOUNTER — Other Ambulatory Visit: Payer: Self-pay | Admitting: Internal Medicine

## 2011-09-10 NOTE — Telephone Encounter (Signed)
Spoke with pt wife, pt is needing inguinal hernia repair and repair of ruptured umbilicus. His wife wants to make sure he does not need echo or CTA prior to procedure and also wants to make sure is okay with dr [name]. Will forward for dr [name] review

## 2011-09-10 NOTE — Telephone Encounter (Signed)
Would repeat cta of chest as aneurysm incresed from 4.2 to 4.6 cm compared to previous [facility]

## 2011-09-11 NOTE — Telephone Encounter (Signed)
Spoke with pt wife, pt will come for labs 09-19-11 and then the CTA will be scheduled for the next week. CTA scheduled for 09-25-11 @ 10 am at [facility]. He will be NPO 4 hours prior to procedure. He will hold his metformin wed, thurs and Friday.

## 2011-09-18 ENCOUNTER — Other Ambulatory Visit (INDEPENDENT_AMBULATORY_CARE_PROVIDER_SITE_OTHER): Payer: Medicare Other

## 2011-09-18 DIAGNOSIS — Z01810 Encounter for preprocedural cardiovascular examination: Secondary | ICD-10-CM

## 2011-09-18 DIAGNOSIS — IMO0001 Reserved for inherently not codable concepts without codable children: Secondary | ICD-10-CM

## 2011-09-18 LAB — BASIC METABOLIC PANEL
GFR: 72.33 mL/min (ref >60.00)
Potassium: 4.7 mEq/L (ref 3.5–5.1)
Sodium: 136 mEq/L (ref 135–145)

## 2011-09-25 ENCOUNTER — Ambulatory Visit (INDEPENDENT_AMBULATORY_CARE_PROVIDER_SITE_OTHER)
Admission: RE | Admit: 2011-09-25 | Discharge: 2011-09-25 | Disposition: A | Discharge status: Home / Self Care | Payer: Medicare Other | Source: Ambulatory Visit | Attending: Cardiology | Admitting: Cardiology

## 2011-09-25 ENCOUNTER — Encounter: Payer: Self-pay | Admitting: *Deleted

## 2011-09-25 DIAGNOSIS — Z01810 Encounter for preprocedural cardiovascular examination: Secondary | ICD-10-CM

## 2011-09-25 DIAGNOSIS — I712 Thoracic aortic aneurysm, without rupture, unspecified: Secondary | ICD-10-CM

## 2011-09-25 DIAGNOSIS — IMO0001 Reserved for inherently not codable concepts without codable children: Secondary | ICD-10-CM

## 2011-09-25 MED ORDER — IOHEXOL 350 MG/ML SOLN
100.0000 mL | Freq: Once | INTRAVENOUS | Status: AC | PRN
  Administered 2011-09-25: 100 mL via INTRAVENOUS

## 2011-10-07 ENCOUNTER — Ambulatory Visit (INDEPENDENT_AMBULATORY_CARE_PROVIDER_SITE_OTHER): Payer: Medicare Other | Admitting: Internal Medicine

## 2011-10-07 ENCOUNTER — Encounter: Payer: Self-pay | Admitting: Internal Medicine

## 2011-10-07 VITALS — BP 140/62 | HR 58 | Temp 97.9°F | Resp 12 | Ht 74.03 in | Wt 232.0 lb

## 2011-10-07 DIAGNOSIS — D126 Benign neoplasm of colon, unspecified: Secondary | ICD-10-CM

## 2011-10-07 DIAGNOSIS — E119 Type 2 diabetes mellitus without complications: Secondary | ICD-10-CM

## 2011-10-07 DIAGNOSIS — I10 Essential (primary) hypertension: Secondary | ICD-10-CM

## 2011-10-07 DIAGNOSIS — E785 Hyperlipidemia, unspecified: Secondary | ICD-10-CM

## 2011-10-07 DIAGNOSIS — Z23 Encounter for immunization: Secondary | ICD-10-CM

## 2011-10-07 DIAGNOSIS — Z0000 Encounter for general adult medical examination without abnormal findings: Secondary | ICD-10-CM

## 2011-10-07 LAB — HEPATIC FUNCTION PANEL
AST: 24 U/L (ref 0–37)
Albumin: 4.3 g/dL (ref 3.5–5.2)
Alkaline Phosphatase: 42 U/L (ref 39–117)
Bilirubin, Direct: 0.1 mg/dL (ref 0.0–0.3)
Total Protein: 7.4 g/dL (ref 6.0–8.3)

## 2011-10-07 LAB — CBC WITH DIFFERENTIAL/PLATELET
Eosinophils Relative: 2.6 % (ref 0.0–5.0)
HCT: 44.3 % (ref 39.0–52.0)
Hemoglobin: 14.3 g/dL (ref 13.0–17.0)
Lymphocytes Relative: 26.1 % (ref 12.0–46.0)
Lymphs Abs: 1.9 10*3/uL (ref 0.7–4.0)
Monocytes Relative: 7.3 % (ref 3.0–12.0)
Neutro Abs: 4.5 10*3/uL (ref 1.4–7.7)
WBC: 7.1 10*3/uL (ref 4.5–10.5)

## 2011-10-07 LAB — LIPID PANEL
Total CHOL/HDL Ratio: 3
VLDL: 30.6 mg/dL (ref 0.0–40.0)

## 2011-10-07 LAB — MICROALBUMIN / CREATININE URINE RATIO
Creatinine,U: 156.6 mg/dL
Microalb, Ur: 1.2 mg/dL (ref 0.0–1.9)

## 2011-10-07 LAB — HEMOGLOBIN A1C: Hgb A1c MFr Bld: 6.5 % (ref 4.6–6.5)

## 2011-10-07 NOTE — Progress Notes (Signed)
Subjective:    Patient ID: William Faulkner, male    DOB: 10/28/1934, 77 y.o.   MRN: [id number]  HPI Medicare Wellness Visit:  The following psychosocial & medical history were reviewed as required by Medicare.   Social history: caffeine: 3 cups coffee/ day , alcohol:  no ,  tobacco use : quit 1986  & exercise : walks 2X/ week & plays golf  2X/week.   Home & personal  safety / fall risk: occasional postural symptoms, activities of daily living:no limitations , seatbelt use : yes , and smoke alarm employment : yes .  Power of Attorney/Living Will status : in place  Vision ( as recorded per Nurse) & Hearing  evaluation :  Ophth exam 8/13, stable pressures. No hearing exam. Orientation :orientation X 3 , memory & recall :good,  math testing:good,and mood & affect : normal . Depression / anxiety: denied Travel history : 67 [place] , immunization status :Shingles needed , transfusion history:  no, and preventive health surveillance ( colonoscopies, BMD , etc as per protocol/ [facility]): colonoscopy up to date; Dental care:  As needed . Chart reviewed &  Updated. Active issues reviewed & addressed.       Review of Systems HYPERTENSION: Disease Monitoring: Blood pressure range-130s/50-60s  Chest pain, palpitations- no      Dyspnea- no Medications: Compliance- yes  Lightheadedness,Syncope- see above ; isometrics recommended   Edema- no  DIABETES: Disease Monitoring: Blood Sugar ranges-108-130  Polyuria/phagia/dipsia- no     Visual problems- no Medications: Compliance- yes Hypoglycemic symptoms- no  HYPERLIPIDEMIA: Disease Monitoring: See symptoms for Hypertension Medications: Compliance- yes  Abd pain, bowel changes- no   Muscle aches-cramping  initially in calves with walking, worse @ 1.5-2 miles           Objective:   Physical Exam Gen.:  well-nourished in appearance. Alert, appropriate and cooperative throughout exam.Appears younger than stated age  Head: Normocephalic  without obvious abnormalities.Goatee Eyes: No corneal or conjunctival inflammation noted. Ptosis OD > OS. Extraocular motion intact. Vision grossly normal. Ears: External  ear exam reveals no significant lesions or deformities. Canals clear .TMs normal. Hearing is grossly normal bilaterally. Nose: External nasal exam reveals no deformity or inflammation. Nasal mucosa are pink and moist. No lesions or exudates noted.  Mouth: Oral mucosa and oropharynx reveal no lesions or exudates. Teeth in good repair. Neck: No deformities, masses, or tenderness noted. Range of motion & Thyroid normal. Lungs: Normal respiratory effort; chest expands symmetrically. Lungs are clear to auscultation without rales, wheezes, or increased work of breathing. Heart: Normal rate and rhythm. Normal S1 and S2. No gallop, click, or rub. To & fro  Predominantly AI  murmur. Abdomen: Bowel sounds normal; abdomen soft and nontender. No masses, organomegaly or hernias noted. Genitalia/ DRE:Dr Tannebaum, Urology  Musculoskeletal/extremities: No deformity or scoliosis noted of  the thoracic or lumbar spine. No clubbing, cyanosis, edema, or deformity noted. Range of motion  normal .Tone & strength  normal.Joints normal. Nail health  good. Vascular: Carotid, radial artery, and  posterior tibial pulses are full and equal.DDP slightly decreased. No bruits present. Neurologic: Alert and oriented x3. Deep tendon reflexes symmetrical and normal.         Skin: Intact without suspicious lesions or rashes. Lymph: No cervical, axillary lymphadenopathy present. Psych: Mood and affect are normal. Normally interactive  Assessment & Plan:  #1 Medicare Wellness Exam; criteria met ; data entered #2 Problem List reviewed ; Assessment/ Recommendations made Plan: see Orders

## 2011-10-07 NOTE — Patient Instructions (Addendum)
Preventive Health Care: Exercise at least 30-45 minutes a day,  3-4 days a week.  Eat a low-fat diet with lots of fruits and vegetables, up to 7-9 servings per day.  Consume less than 40 grams of sugar per day from foods & drinks with High Fructose Corn Sugar as #1,2,3 or # 4 on label. If you activate My Chart; the results can be released to you as soon as they populate from the lab. If you choose not to use this program; the labs have to be reviewed, copied & mailed   causing a delay in getting the results to you.  

## 2011-10-11 ENCOUNTER — Encounter (HOSPITAL_COMMUNITY): Payer: Self-pay | Admitting: Pharmacy Technician

## 2011-10-12 ENCOUNTER — Other Ambulatory Visit: Payer: Self-pay | Admitting: Cardiology

## 2011-10-18 ENCOUNTER — Encounter: Payer: Self-pay | Admitting: Internal Medicine

## 2011-10-18 NOTE — Patient Instructions (Addendum)
20 NASIEM SIDENER  10/18/2011   Your procedure is scheduled on:  10-24-2011  Report to [facility] at 0700  AM.  Call this number if you have problems the morning of surgery: [phone]   Remember:driver for surgery wife flora cell [phone] to be with you for 24 hours after surgery.   Do not eat food or drink liquids:After Midnight.  medications morning of surgery with a sip of water only: coreg, timolol   Do not wear jewelry or make up.  Do not wear lotions, powders, or perfumes. You may wear deodorant.    Do not bring valuables to the hospital.  Contacts, dentures or bridgework may not be worn into surgery.  Leave suitcase in the car. After surgery it may be brought to your room.  For patients admitted to the hospital, checkout time is 11:00 AM the day of discharge                             Patients discharged the day of surgery will not be allowed to drive home. If going home same day of surgery, you must have someone stay with you the first 24 hours at home and arrange for some one to drive you home from hospital.    Special Instructions: See [facility] Preparing for Surgery instruction sheet. Women do not shave legs or underarms for 12 hours before showers. Men may shave face morning of surgery.    Please read over the following fact sheets that you were given: MRSA Information  [facility] pre op nurse phone number [phone], call if needed

## 2011-10-18 NOTE — Progress Notes (Addendum)
08-26-2011 cardiac clearance note dr [name] on chart and in epic under notes Chest ct 09-25-2011 epic ekg 02-26-2011 epic lov dr [name] cardiology 02-26-2011 epic 10-03-2010 carotid doppler epic Cbc with dif 10-07-2011 epic

## 2011-10-20 ENCOUNTER — Other Ambulatory Visit: Payer: Self-pay | Admitting: Internal Medicine

## 2011-10-21 ENCOUNTER — Encounter (HOSPITAL_COMMUNITY): Payer: Self-pay

## 2011-10-21 ENCOUNTER — Encounter (HOSPITAL_COMMUNITY)
Admission: RE | Admit: 2011-10-21 | Discharge: 2011-10-21 | Disposition: A | Discharge status: Home / Self Care | Payer: Medicare Other | Source: Ambulatory Visit | Attending: Surgery | Admitting: Surgery

## 2011-10-21 ENCOUNTER — Inpatient Hospital Stay (HOSPITAL_COMMUNITY): Admission: RE | Admit: 2011-10-21 | Payer: Medicare Other | Source: Ambulatory Visit

## 2011-10-21 LAB — BASIC METABOLIC PANEL
Calcium: 9.7 mg/dL (ref 8.4–10.5)
GFR calc non Af Amer: 79 mL/min — ABNORMAL LOW (ref >90)
Potassium: 4.6 mEq/L (ref 3.5–5.1)
Sodium: 135 mEq/L (ref 135–145)

## 2011-10-21 LAB — SURGICAL PCR SCREEN: Staphylococcus aureus: NEGATIVE

## 2011-10-21 NOTE — Anesthesia Preprocedure Evaluation (Addendum)
Anesthesia Evaluation  Patient identified by MRN, date of birth, ID band Patient awake    Reviewed: Allergy & Precautions, H&P , NPO status , Patient's Chart, lab work & pertinent test results, reviewed documented beta blocker date and time   Airway Mallampati: I TM Distance: >3 FB Neck ROM: Full    Dental  (+) Dental Advisory Given and Teeth Intact   Pulmonary former smoker,  breath sounds clear to auscultation  Pulmonary exam normal       Cardiovascular hypertension, Pt. on medications and Pt. on home beta blockers + angina + CAD and + CABG + Valvular Problems/Murmurs AI Rhythm:Regular Rate:Normal  Study Conclusions  - Left ventricle: The cavity size was normal. Wall thickness   was increased in a pattern of mild LVH. Systolic function   was vigorous. The estimated ejection fraction was in the   range of 65% to 70%. Wall motion was normal; there were no   regional wall motion abnormalities. Doppler parameters are   consistent with abnormal left ventricular relaxation   (grade 1 diastolic dysfunction). - Aortic valve: Moderate regurgitation. - Mitral valve: Mild regurgitation. - Left atrium: The atrium was mildly dilated. - Right atrium: The atrium was mildly dilated.   Neuro/Psych Mild carotid stenosis: 40-59% right carotid stenosis, 0-39% left. Complete occlusion of right vertebral artery.    GI/Hepatic   Endo/Other  diabetes, Type 2, Oral Hypoglycemic Agents  Renal/GU      Musculoskeletal   Abdominal   Peds  Hematology   Anesthesia Other Findings   Reproductive/Obstetrics                        Anesthesia Physical Anesthesia Plan  ASA: III  Anesthesia Plan: General   Post-op Pain Management:    Induction: Intravenous  Airway Management Planned: Oral ETT  Additional Equipment:   Intra-op Plan:   Post-operative Plan: Extubation in OR  Informed Consent: I have reviewed the  patients History and Physical, chart, labs and discussed the procedure including the risks, benefits and alternatives for the proposed anesthesia with the patient or authorized representative who has indicated his/her understanding and acceptance.   Dental advisory given  Plan Discussed with: CRNA  Anesthesia Plan Comments:         Anesthesia Quick Evaluation

## 2011-10-24 ENCOUNTER — Encounter (HOSPITAL_COMMUNITY)
Admission: RE | Disposition: A | Discharge status: Home / Self Care | Payer: Self-pay | Source: Ambulatory Visit | Attending: Surgery

## 2011-10-24 ENCOUNTER — Ambulatory Visit (HOSPITAL_COMMUNITY): Payer: Medicare Other | Admitting: Anesthesiology

## 2011-10-24 ENCOUNTER — Encounter (HOSPITAL_COMMUNITY): Payer: Self-pay | Admitting: *Deleted

## 2011-10-24 ENCOUNTER — Encounter (HOSPITAL_COMMUNITY): Payer: Self-pay | Admitting: Anesthesiology

## 2011-10-24 ENCOUNTER — Ambulatory Visit (HOSPITAL_COMMUNITY)
Admission: RE | Admit: 2011-10-24 | Discharge: 2011-10-24 | Disposition: A | Discharge status: Home / Self Care | Payer: Medicare Other | Source: Ambulatory Visit | Attending: Surgery | Admitting: Surgery

## 2011-10-24 DIAGNOSIS — Z7982 Long term (current) use of aspirin: Secondary | ICD-10-CM | POA: Insufficient documentation

## 2011-10-24 DIAGNOSIS — I679 Cerebrovascular disease, unspecified: Secondary | ICD-10-CM | POA: Insufficient documentation

## 2011-10-24 DIAGNOSIS — Z79899 Other long term (current) drug therapy: Secondary | ICD-10-CM | POA: Insufficient documentation

## 2011-10-24 DIAGNOSIS — I10 Essential (primary) hypertension: Secondary | ICD-10-CM | POA: Insufficient documentation

## 2011-10-24 DIAGNOSIS — K4090 Unilateral inguinal hernia, without obstruction or gangrene, not specified as recurrent: Secondary | ICD-10-CM

## 2011-10-24 DIAGNOSIS — Z01812 Encounter for preprocedural laboratory examination: Secondary | ICD-10-CM | POA: Insufficient documentation

## 2011-10-24 DIAGNOSIS — I2510 Atherosclerotic heart disease of native coronary artery without angina pectoris: Secondary | ICD-10-CM | POA: Insufficient documentation

## 2011-10-24 DIAGNOSIS — Z951 Presence of aortocoronary bypass graft: Secondary | ICD-10-CM | POA: Insufficient documentation

## 2011-10-24 DIAGNOSIS — K429 Umbilical hernia without obstruction or gangrene: Secondary | ICD-10-CM

## 2011-10-24 DIAGNOSIS — Z8551 Personal history of malignant neoplasm of bladder: Secondary | ICD-10-CM | POA: Insufficient documentation

## 2011-10-24 DIAGNOSIS — E119 Type 2 diabetes mellitus without complications: Secondary | ICD-10-CM | POA: Insufficient documentation

## 2011-10-24 DIAGNOSIS — D176 Benign lipomatous neoplasm of spermatic cord: Secondary | ICD-10-CM | POA: Insufficient documentation

## 2011-10-24 HISTORY — PX: UMBILICAL HERNIA REPAIR: SHX196

## 2011-10-24 HISTORY — PX: INGUINAL HERNIA REPAIR: SHX194

## 2011-10-24 LAB — GLUCOSE, CAPILLARY: Glucose-Capillary: 97 mg/dL (ref 70–99)

## 2011-10-24 SURGERY — REPAIR, HERNIA, INGUINAL, ADULT
Anesthesia: General | Site: Groin | Laterality: Right | Wound class: Clean

## 2011-10-24 MED ORDER — SUCCINYLCHOLINE CHLORIDE 20 MG/ML IJ SOLN
INTRAMUSCULAR | Status: DC | PRN
  Administered 2011-10-24: 150 mg via INTRAVENOUS

## 2011-10-24 MED ORDER — ACETAMINOPHEN 10 MG/ML IV SOLN
INTRAVENOUS | Status: AC
  Administered 2011-10-24: 1000 mg via INTRAVENOUS
  Filled 2011-10-24: qty 100

## 2011-10-24 MED ORDER — CEFAZOLIN SODIUM-DEXTROSE 2-3 GM-% IV SOLR
INTRAVENOUS | Status: AC
  Filled 2011-10-24: qty 50

## 2011-10-24 MED ORDER — HYDROMORPHONE HCL PF 1 MG/ML IJ SOLN
INTRAMUSCULAR | Status: AC
  Filled 2011-10-24: qty 1

## 2011-10-24 MED ORDER — LACTATED RINGERS IV SOLN
INTRAVENOUS | Status: DC | PRN
  Administered 2011-10-24 (×2): via INTRAVENOUS

## 2011-10-24 MED ORDER — LIDOCAINE HCL (CARDIAC) 20 MG/ML IV SOLN
INTRAVENOUS | Status: DC | PRN
  Administered 2011-10-24: 30 mg via INTRAVENOUS

## 2011-10-24 MED ORDER — LACTATED RINGERS IV SOLN
INTRAVENOUS | Status: DC
  Administered 2011-10-24: 1000 mL via INTRAVENOUS
  Administered 2011-10-24: 13:00:00 via INTRAVENOUS

## 2011-10-24 MED ORDER — GLYCOPYRROLATE 0.2 MG/ML IJ SOLN
INTRAMUSCULAR | Status: DC | PRN
  Administered 2011-10-24: .6 mg via INTRAVENOUS
  Administered 2011-10-24: 0.4 mg via INTRAVENOUS

## 2011-10-24 MED ORDER — BUPIVACAINE HCL (PF) 0.5 % IJ SOLN
INTRAMUSCULAR | Status: AC
  Filled 2011-10-24: qty 30

## 2011-10-24 MED ORDER — MEPERIDINE HCL 50 MG/ML IJ SOLN: 6.2500 mg | INTRAMUSCULAR | Status: DC | PRN

## 2011-10-24 MED ORDER — OXYCODONE HCL 5 MG/5ML PO SOLN
5.0000 mg | Freq: Once | ORAL | Status: AC | PRN
  Filled 2011-10-24: qty 5

## 2011-10-24 MED ORDER — 0.9 % SODIUM CHLORIDE (POUR BTL) OPTIME
TOPICAL | Status: DC | PRN
  Administered 2011-10-24: 1000 mL

## 2011-10-24 MED ORDER — BUPIVACAINE HCL (PF) 0.5 % IJ SOLN
INTRAMUSCULAR | Status: DC | PRN
  Administered 2011-10-24: 60 mL

## 2011-10-24 MED ORDER — MIDAZOLAM HCL 5 MG/5ML IJ SOLN
INTRAMUSCULAR | Status: DC | PRN
  Administered 2011-10-24: 1 mg via INTRAVENOUS

## 2011-10-24 MED ORDER — HYDROMORPHONE HCL PF 1 MG/ML IJ SOLN
0.2500 mg | INTRAMUSCULAR | Status: DC | PRN
  Administered 2011-10-24 (×3): 0.5 mg via INTRAVENOUS

## 2011-10-24 MED ORDER — FENTANYL CITRATE 0.05 MG/ML IJ SOLN
INTRAMUSCULAR | Status: DC | PRN
  Administered 2011-10-24 (×3): 50 ug via INTRAVENOUS
  Administered 2011-10-24 (×3): 25 ug via INTRAVENOUS

## 2011-10-24 MED ORDER — CEFAZOLIN SODIUM-DEXTROSE 2-3 GM-% IV SOLR
2.0000 g | INTRAVENOUS | Status: AC
  Administered 2011-10-24: 2 g via INTRAVENOUS

## 2011-10-24 MED ORDER — NEOSTIGMINE METHYLSULFATE 1 MG/ML IJ SOLN
INTRAMUSCULAR | Status: DC | PRN
  Administered 2011-10-24: 4 mg via INTRAVENOUS

## 2011-10-24 MED ORDER — OXYCODONE HCL 5 MG PO TABS
5.0000 mg | ORAL_TABLET | Freq: Once | ORAL | Status: AC | PRN
  Administered 2011-10-24: 5 mg via ORAL

## 2011-10-24 MED ORDER — ACETAMINOPHEN 10 MG/ML IV SOLN
1000.0000 mg | Freq: Once | INTRAVENOUS | Status: AC | PRN
  Administered 2011-10-24: 1000 mg via INTRAVENOUS

## 2011-10-24 MED ORDER — ACETAMINOPHEN 10 MG/ML IV SOLN
INTRAVENOUS | Status: DC | PRN
  Administered 2011-10-24: 1000 mg via INTRAVENOUS

## 2011-10-24 MED ORDER — ACETAMINOPHEN 10 MG/ML IV SOLN
INTRAVENOUS | Status: AC
  Filled 2011-10-24: qty 100

## 2011-10-24 MED ORDER — EPHEDRINE SULFATE 50 MG/ML IJ SOLN
INTRAMUSCULAR | Status: DC | PRN
  Administered 2011-10-24: 10 mg via INTRAVENOUS

## 2011-10-24 MED ORDER — CISATRACURIUM BESYLATE (PF) 10 MG/5ML IV SOLN
INTRAVENOUS | Status: DC | PRN
  Administered 2011-10-24: 2 mg via INTRAVENOUS
  Administered 2011-10-24: 10 mg via INTRAVENOUS

## 2011-10-24 MED ORDER — ONDANSETRON HCL 4 MG/2ML IJ SOLN
INTRAMUSCULAR | Status: DC | PRN
  Administered 2011-10-24: 2 mg via INTRAVENOUS

## 2011-10-24 MED ORDER — PROMETHAZINE HCL 25 MG/ML IJ SOLN
INTRAMUSCULAR | Status: AC
  Filled 2011-10-24: qty 1

## 2011-10-24 MED ORDER — PROMETHAZINE HCL 25 MG/ML IJ SOLN
6.2500 mg | INTRAMUSCULAR | Status: DC | PRN
  Administered 2011-10-24: 6.25 mg via INTRAVENOUS

## 2011-10-24 MED ORDER — HYDROCODONE-ACETAMINOPHEN 5-325 MG PO TABS: 1.0000 | ORAL_TABLET | ORAL | Status: DC | PRN

## 2011-10-24 MED ORDER — PROPOFOL 10 MG/ML IV EMUL
INTRAVENOUS | Status: DC | PRN
  Administered 2011-10-24: 200 mg via INTRAVENOUS
  Administered 2011-10-24: 50 mg via INTRAVENOUS

## 2011-10-24 MED ORDER — OXYCODONE HCL 5 MG PO TABS
ORAL_TABLET | ORAL | Status: AC
  Filled 2011-10-24: qty 1

## 2011-10-24 SURGICAL SUPPLY — 49 items
APL SKNCLS STERI-STRIP NONHPOA (GAUZE/BANDAGES/DRESSINGS) ×4
APPLICATOR COTTON TIP 6IN STRL (MISCELLANEOUS) ×3 IMPLANT
BENZOIN TINCTURE PRP APPL 2/3 (GAUZE/BANDAGES/DRESSINGS) ×4 IMPLANT
BLADE HEX COATED 2.75 (ELECTRODE) ×3 IMPLANT
BLADE SURG 15 STRL LF DISP TIS (BLADE) ×2 IMPLANT
BLADE SURG 15 STRL SS (BLADE) ×3
BLADE SURG SZ10 CARB STEEL (BLADE) ×6 IMPLANT
CANISTER SUCTION 2500CC (MISCELLANEOUS) ×3 IMPLANT
CHLORAPREP W/TINT 26ML (MISCELLANEOUS) ×3 IMPLANT
CLEANER TIP ELECTROSURG 2X2 (MISCELLANEOUS) ×3 IMPLANT
CLOTH BEACON ORANGE TIMEOUT ST (SAFETY) ×3 IMPLANT
CLSR STERI-STRIP ANTIMIC 1/2X4 (GAUZE/BANDAGES/DRESSINGS) ×2 IMPLANT
DECANTER SPIKE VIAL GLASS SM (MISCELLANEOUS) ×3 IMPLANT
DRAIN PENROSE 18X1/2 LTX STRL (DRAIN) ×3 IMPLANT
DRAPE LAPAROTOMY T 102X78X121 (DRAPES) ×3 IMPLANT
DRAPE LAPAROTOMY TRNSV 102X78 (DRAPE) ×2 IMPLANT
ELECT REM PT RETURN 9FT ADLT (ELECTROSURGICAL) ×3
ELECTRODE REM PT RTRN 9FT ADLT (ELECTROSURGICAL) ×2 IMPLANT
GLOVE BIOGEL PI IND STRL 7.0 (GLOVE) ×2 IMPLANT
GLOVE BIOGEL PI INDICATOR 7.0 (GLOVE) ×1
GLOVE SURG ORTHO 8.0 STRL STRW (GLOVE) ×3 IMPLANT
GOWN STRL NON-REIN LRG LVL3 (GOWN DISPOSABLE) ×2 IMPLANT
GOWN STRL REIN XL XLG (GOWN DISPOSABLE) ×7 IMPLANT
KIT BASIN OR (CUSTOM PROCEDURE TRAY) ×3 IMPLANT
MESH ULTRAPRO 3X6 7.6X15CM (Mesh General) ×1 IMPLANT
MESH VENTRALEX ST 1-7/10 CRC S (Mesh General) ×1 IMPLANT
NDL HYPO 25X1 1.5 SAFETY (NEEDLE) ×2 IMPLANT
NEEDLE HYPO 25X1 1.5 SAFETY (NEEDLE) ×3 IMPLANT
NS IRRIG 1000ML POUR BTL (IV SOLUTION) ×3 IMPLANT
PACK BASIC VI WITH GOWN DISP (CUSTOM PROCEDURE TRAY) ×3 IMPLANT
PEN SKIN MARKING BROAD (MISCELLANEOUS) ×3 IMPLANT
PENCIL BUTTON HOLSTER BLD 10FT (ELECTRODE) ×3 IMPLANT
SPONGE GAUZE 4X4 12PLY (GAUZE/BANDAGES/DRESSINGS) ×3 IMPLANT
SPONGE LAP 4X18 X RAY DECT (DISPOSABLE) ×8 IMPLANT
STRIP CLOSURE SKIN 1/2X4 (GAUZE/BANDAGES/DRESSINGS) ×3 IMPLANT
SUT ETHIBOND NAB CT1 #1 30IN (SUTURE) ×3 IMPLANT
SUT MNCRL AB 4-0 PS2 18 (SUTURE) ×4 IMPLANT
SUT NOVA NAB GS-22 2 0 T19 (SUTURE) ×6 IMPLANT
SUT SILK 2 0 (SUTURE) ×3
SUT SILK 2 0 SH (SUTURE) ×3 IMPLANT
SUT SILK 2-0 18XBRD TIE 12 (SUTURE) IMPLANT
SUT VIC AB 3-0 SH 18 (SUTURE) ×3 IMPLANT
SUT VIC AB 3-0 SH 8-18 (SUTURE) ×1 IMPLANT
SYR BULB IRRIGATION 50ML (SYRINGE) ×3 IMPLANT
SYR CONTROL 10ML LL (SYRINGE) ×3 IMPLANT
TAPE CLOTH SURG 4X10 WHT LF (GAUZE/BANDAGES/DRESSINGS) ×2 IMPLANT
TOWEL OR 17X26 10 PK STRL BLUE (TOWEL DISPOSABLE) ×3 IMPLANT
WATER STERILE IRR 1500ML POUR (IV SOLUTION) ×3 IMPLANT
YANKAUER SUCT BULB TIP 10FT TU (MISCELLANEOUS) ×3 IMPLANT

## 2011-10-24 NOTE — Brief Op Note (Signed)
10/24/2011  11:22 AM  PATIENT:  William Faulkner  77 y.o. male  PRE-OPERATIVE DIAGNOSIS:  Right inguinal hernia, umbilical hernia  POST-OPERATIVE DIAGNOSIS:  Right inguinal hernia, umbilical hernia  PROCEDURE:  1. Repair RIH with Ethicon Ultrapro mesh (3 x 6 inch)   2. Repair umbilical hernia with Bard Ventralex ST mesh patch (4.3 cm)  SURGEON:  Surgeon(s) and Role:    * Velora Heckler, MD - Primary  ANESTHESIA:   general  EBL:  Total I/O In: 1000 [I.V.:1000] Out: 5 [Blood:5]  BLOOD ADMINISTERED:none  DRAINS: none   LOCAL MEDICATIONS USED:  MARCAINE     SPECIMEN:  No Specimen  DISPOSITION OF SPECIMEN:  N/A  COUNTS:  YES  TOURNIQUET:  * No tourniquets in log *  DICTATION: .Other Dictation: Dictation Number [id number]  PLAN OF CARE: Discharge to home after PACU  PATIENT DISPOSITION:  PACU - hemodynamically stable.   Delay start of Pharmacological VTE agent (>24hrs) due to surgical blood loss or risk of bleeding: yes  Velora Heckler, MD, [facility] Office: [phone]

## 2011-10-24 NOTE — Transfer of Care (Signed)
Immediate Anesthesia Transfer of Care Note  Patient: William Faulkner  Procedure(s) Performed: Procedure(s) (LRB) with comments: HERNIA REPAIR INGUINAL ADULT (Right) - Repair Right Inguinal Hernia with Mesh, Umbilical Hernia Repair with Mesh HERNIA REPAIR UMBILICAL ADULT (N/A) INSERTION OF MESH (N/A)  Patient Location: PACU  Anesthesia Type: General  Level of Consciousness: sedated  Airway & Oxygen Therapy: Patient Spontanous Breathing and Patient connected to face mask oxygen  Post-op Assessment: Report given to PACU RN and Post -op Vital signs reviewed and stable  Post vital signs: Reviewed and stable  Complications: No apparent anesthesia complications

## 2011-10-24 NOTE — Anesthesia Postprocedure Evaluation (Signed)
Anesthesia Post Note  Patient: William Faulkner  Procedure(s) Performed: Procedure(s) (LRB): HERNIA REPAIR INGUINAL ADULT (Right) HERNIA REPAIR UMBILICAL ADULT (N/A) INSERTION OF MESH (N/A)  Anesthesia type: General  Patient location: PACU  Post pain: Pain level controlled  Post assessment: Post-op Vital signs reviewed  Last Vitals: BP 123/44  Pulse 48  Temp 36.6 C (Oral)  Resp 15  SpO2 93%  Post vital signs: Reviewed  Level of consciousness: sedated  Complications: No apparent anesthesia complications

## 2011-10-24 NOTE — Progress Notes (Signed)
Report given to Pam RN

## 2011-10-24 NOTE — H&P (Signed)
William Faulkner is an 77 y.o. male.    Chief Complaint: Right inguinal hernia, umbilical hernia  HPI: Patient is a 77 year old white male referred by his primary care physician with right inguinal hernia. Patient first developed symptoms in January 2013. After significant walking, the patient noted discomfort in the lower abdomen. He developed a bulge on the right side. This has always been reducible. He denies any signs or symptoms of obstruction. He has had no prior hernia repairs. Of note, the patient also has an umbilical hernia which has been present for several years and gradually increasing in size. It does not cause discomfort. Patient has had no prior abdominal surgery. He presents today for consideration for operative repair.   Past Medical History  Diagnosis Date  . Other and unspecified hyperlipidemia   . HYPERTENSION, ESSENTIAL NOS   . CEREBROVASCULAR DISEASE   . CAD, ARTERY BYPASS GRAFT   . AORTIC INSUFFICIENCY   . AORTIC ANEUR UNSPEC SITE WITHOUT MENTION RUPTURE   . DIAB W/O COMP TYPE II/UNS NOT STATED UNCNTRL   . NEOPLASM, MALIGNANT, BLADDER, HX OF     Past Surgical History  Procedure Date  . Rotator cuff repair 2009    right  . Bladder tumor excision     Surgery x6, Dr.Tannebaum  . Colonoscopy 2002/2004    Negative  . Lumbar epidural injection 2008  . Colonoscopy w/ polypectomy     Dr [facility]  . Blepharoplasty 1980    bilaterally, Dr Nile Riggs  . Coronary artery bypass graft 2011    x 5  . Back surgery 2003    lower    Family History  Problem Relation Age of Onset  . Diabetes Mother   . Tremor Father   . Stroke Neg Hx   . Heart disease Neg Hx   . Cancer Neg Hx    Social History:  reports that he quit smoking about 27 years ago. His smoking use included Cigarettes. He has a 4.5 pack-year smoking history. He has never used smokeless tobacco. He reports that he does not drink alcohol or use illicit drugs.  Allergies:  Allergies  Allergen  Reactions  . Atorvastatin     REACTION: increased cpk  . Celecoxib     REACTION: palpitations= celebrex  . Sulfonamide Derivatives     Not sure reaction  . Zolpidem Tartrate     REACTION: made patient feel weird= ambien    Medications Prior to Admission  Medication Sig Dispense Refill  . benazepril (LOTENSIN) 40 MG tablet TAKE ONE TABLET BY MOUTH ONE TIME DAILY  90 tablet  1  . carvedilol (COREG) 12.5 MG tablet Take 12.5 mg by mouth 2 (two) times daily with a meal.      . CRESTOR 20 MG tablet TAKE 1 TABLET DAILY  90 each  3  . ketoconazole (NIZORAL) 2 % cream Apply topically 2 (two) times daily.  60 g  0  . metFORMIN (GLUCOPHAGE) 850 MG tablet TAKE ONE TABLET BY MOUTH TWICE DAILY WITH TWO LARGEST MEALS  180 tablet  0  . timolol (TIMOPTIC) 0.5 % ophthalmic solution Place 1 drop into both eyes every morning. As directed      . amoxicillin (AMOXIL) 500 MG capsule Take 500 mg by mouth. 4 pills 1 hour pre dental work      . aspirin 81 MG tablet Take 81 mg by mouth daily.        . benazepril (LOTENSIN) 40 MG tablet Take 40 mg by  mouth every evening.         No results found for this or any previous visit (from the past 48 hour(s)). No results found.  Review of Systems  Constitutional: Negative.   HENT: Negative.   Eyes: Negative.   Respiratory: Negative.   Cardiovascular: Negative.   Gastrointestinal: Negative.   Genitourinary: Negative.   Musculoskeletal: Negative.   Skin: Negative.   Neurological: Negative.   Endo/Heme/Allergies: Negative.   Psychiatric/Behavioral: Negative.     Blood pressure 124/46, pulse 61, temperature 97.6 F (36.4 C), temperature source Oral, resp. rate 18, SpO2 100.00%. Physical Exam  Constitutional: He is oriented to person, place, and time. He appears well-developed and well-nourished. No distress.  HENT:  Head: Normocephalic and atraumatic.  Right Ear: External ear normal.  Left Ear: External ear normal.  Mouth/Throat: Oropharynx is clear and  moist.  Eyes: Conjunctivae normal and EOM are normal. Pupils are equal, round, and reactive to light. No scleral icterus.  Neck: Normal range of motion. Neck supple. No thyromegaly present.  Cardiovascular: Normal rate, regular rhythm and normal heart sounds.   Respiratory: Effort normal and breath sounds normal. He has no wheezes.  GI: Soft. Bowel sounds are normal. He exhibits no distension. There is no tenderness.       Reducible umbilical hernia, moderate  Genitourinary:       Right inguinal hernia, moderate, reducible   Musculoskeletal: Normal range of motion. He exhibits no edema.  Lymphadenopathy:    He has no cervical adenopathy.  Neurological: He is alert and oriented to person, place, and time.  Skin: Skin is warm and dry.  Psychiatric: He has a normal mood and affect. His behavior is normal. Judgment and thought content normal.     Assessment/Plan 1. Right inguinal hernia, reducible 2. Umbilical hernia  - plan repair with mesh as out-patient  The risks and benefits of the procedure have been discussed at length with the patient.  The patient understands the proposed procedure, potential alternative treatments, and the course of recovery to be expected.  All of the patient's questions have been answered at this time.  The patient wishes to proceed with surgery.  Velora Heckler, MD, [facility] Office: [phone]    Heidemarie Goodnow Judie Petit 10/24/2011, 9:22 AM

## 2011-10-25 ENCOUNTER — Telehealth (INDEPENDENT_AMBULATORY_CARE_PROVIDER_SITE_OTHER): Payer: Self-pay

## 2011-10-25 ENCOUNTER — Telehealth: Payer: Self-pay | Admitting: Internal Medicine

## 2011-10-25 ENCOUNTER — Telehealth (INDEPENDENT_AMBULATORY_CARE_PROVIDER_SITE_OTHER): Payer: Self-pay | Admitting: General Surgery

## 2011-10-25 ENCOUNTER — Encounter (HOSPITAL_COMMUNITY): Payer: Self-pay | Admitting: Surgery

## 2011-10-25 NOTE — Telephone Encounter (Signed)
Pt home doing well. PO appt made. 

## 2011-10-25 NOTE — Telephone Encounter (Signed)
He can  have Hydromet 120 cc; 1 teaspoon every day 8-12 hours as needed for cough. If he feels he needs an antibiotic; he should be seen at the Saturday clinic which is @ the [place] office

## 2011-10-25 NOTE — Telephone Encounter (Signed)
Caller: Flora/Spouse;  PCP: Marga Melnick; Best Callback Phone Number: [phone]; Caller reports following hernia repair surgery 10/24/11, taking Robitussin for his Cough. concern with his stitches, notes brownish mucus, T 99.5, using Acetaminophen. Surgeon referred to his PCP to see if he needs an antibiotic, CXR clear prior to the surgery. Guideline: Post-OP. Uses [facility], History of bronchitis.   Please call.

## 2011-10-25 NOTE — Op Note (Signed)
William Faulkner, William Faulkner            ACCOUNT NO.:  [id number]  MEDICAL RECORD NO.:  [id number]  LOCATION:  [facility]                         FACILITY:  [facility]  PHYSICIAN:  Velora Heckler, MD      DATE OF BIRTH:  August 19, 1934  DATE OF PROCEDURE:  10/24/2011                               OPERATIVE REPORT   PREOPERATIVE DIAGNOSES: 1. Reducible right inguinal hernia. 2. Reducible umbilical hernia.  POSTOPERATIVE DIAGNOSES: 1. Reducible right inguinal hernia. 2. Reducible umbilical hernia.  PROCEDURE: 1. Repair of right inguinal hernia with Ethicon UltraPro Mesh. 2. Repair of umbilical hernia with Bard ST Ventralex patch.  SURGEON:  Velora Heckler, MD, FACS  ANESTHESIA:  General.  ESTIMATED BLOOD LOSS:  Minimal.  PREPARATION:  ChloraPrep.  COMPLICATIONS:  None.  INDICATIONS:  The patient is a 77 year old white male referred with symptomatic right inguinal hernia and an enlarging umbilical hernia.  He now comes to Surgery for concurrent repair.  BODY OF REPORT:  Procedure was done in OR #1 at the [facility]  The patient was brought to the operating room, placed in the supine position on the operating room table.  Following administration of general anesthesia, the patient was positioned and then prepped and draped in the usual strict aseptic fashion.  After ascertaining that, an adequate level of anesthesia had been achieved, the right inguinal incision was made with a #15 blade.  Dissection was carried through the subcutaneous tissues and hemostasis was obtained with electrocautery.  Gelpi retractor was placed for exposure.  External oblique fascia was incised in line of its fibers and extended through the external inguinal ring.  Cord structures were dissected out of the inguinal canal and encircled with the Penrose drain.  Exploration of the cord reveals a large lipoma of the cord, which was dissected out down to the level of the internal inguinal ring.  The  lipoma was then divided between hemostats and ligated with 2-0 silk ties.  Tissue was excised and discarded.  Exploration reveals a moderate-sized indirect inguinal hernia sac.  This was likewise dissected out down to the level of the internal inguinal ring where a high ligation of the sac was performed with a 2-0 silk suture ligature and the sac was excised and discarded.  Floor of the inguinal canal was then recreated with a sheet of Ethicon UltraPro Mesh.  Mesh was cut to the appropriate dimensions.  It was secured to the pubic tubercle and along the inguinal ligament with a running 2-0 Novafil suture.  Mesh was split to accommodate the cord structures.  Superior margin of the mesh was secured to the transversalis and internal oblique fascia with interrupted 2-0 Novafil sutures.  Tails of the mesh were overlapped lateral to the cord structures and secured to the inguinal ligament with interrupted 2-0 Novafil sutures.  Local field block was placed with Marcaine.  Cord structures were returned to the inguinal canal.  External oblique fascia was closed with interrupted 3-0 Vicryl sutures.  Subcutaneous tissues were closed with interrupted 3-0 Vicryl sutures.  Skin was anesthetized with local anesthetic.  Skin edges were reapproximated with a running 4- 0 Monocryl subcuticular suture.  Next, the umbilical hernia was repaired.  Incision was made below the level of the umbilicus transversely with a #15 blade.  Dissection was carried down to the fascial plane.  Hernia sac was opened.  A small amount of omentum was reduced back within the peritoneal cavity. Umbilicus was completely mobilized off the anterior abdominal wall. Fascial defect measures 1.5 cm in diameter.  A Bard Ventralex ST mesh was selected.  The 4.3-cm diameter mesh was employed.  It was prepared and inserted into the preperitoneal space.  It was secured to the fascia circumferentially with interrupted #1 Ethibond simple  sutures.  Local field block was placed with Marcaine.  Umbilicus was re-affixed to the anterior abdominal wall with an interrupted #1 Ethibond suture. Subcutaneous tissues were closed with interrupted 3-0 Vicryl sutures. Skin was anesthetized with local anesthetic.  Skin edges were reapproximated with a running 4-0 Monocryl subcuticular suture.  Both incisions were washed and dried.  Benzoin and Steri-Strips were applied to both incisions.  Sterile dressings were applied to both incisions.  The patient was awakened from anesthesia and brought to the recovery room.  The patient tolerated the procedure well.   Velora Heckler, MD, [facility] Office: [phone]    TMG/MEDQ  D:  10/24/2011  T:  10/25/2011  Job:  [id number]  cc:   Titus Dubin. Alwyn Ren, MD,FACP,FCCP [phone] [address] [place], [place] [address]

## 2011-10-25 NOTE — Telephone Encounter (Signed)
Spoke with patient's wife, Dr.Hopper's instructions given. Patient's wife with a bottle of prescribed cough med that was given to her in the past (patient's wife double checked med not expired). I gave specific instructions how patient should go about being seen at [facility] if needed. I also made it clear that it is being held at [place] (Not [place]-usual location). Patient's wife states she will see how her husband does over night.

## 2011-10-25 NOTE — Telephone Encounter (Signed)
Pt's wife called around 3pm to report that mr William Faulkner was coughing up mucous that was more brownish compared to greenish yesterday. Robitussin has helped with the coughing. Pt is using pillow to splint incision area. Pain medication is effective for the pain and pt. Is voiding ok per wife. Temp 99. I reviewed this with Dr. Gerrit Friends and he recommended wife contact Primary Care Dr. For further treatment for congestion/recommendation. Also if symptoms worsen she can contact on call surgeon over weekend/ Pt's wife notified/gy

## 2011-11-01 ENCOUNTER — Encounter: Payer: Self-pay | Admitting: Internal Medicine

## 2011-11-01 ENCOUNTER — Ambulatory Visit (INDEPENDENT_AMBULATORY_CARE_PROVIDER_SITE_OTHER): Payer: Medicare Other | Admitting: Internal Medicine

## 2011-11-01 VITALS — BP 150/68 | HR 65 | Temp 98.0°F | Resp 16 | Wt 232.5 lb

## 2011-11-01 DIAGNOSIS — M7130 Other bursal cyst, unspecified site: Secondary | ICD-10-CM

## 2011-11-01 NOTE — Patient Instructions (Addendum)
Review and correct the record as indicated. Please share record with all medical staff seen.  

## 2011-11-01 NOTE — Progress Notes (Signed)
  Subjective:    Patient ID: William Faulkner, male    DOB: [birth year], 77 y.o.   MRN: [id number]  HPI He developed 2 "nodules" over the lateral anterior ankle on the left 10/14/11. This has increased in size and progressed somewhat medially. It was not associated with any trigger or injury such as rolling his ankle. It is not associated with pain, numbness, tingling, color change, temperature change, or rash.. He has had some swelling of the left foot    Review of Systems Constitutional: no fever, chills, sweats, change in weight  Musculoskeletal:no  muscle cramps or pain; no  joint stiffness, redness, or swelling Heme:no lymphadenopathy; abnormal bruising or bleeding other than due to surgery       Objective:   Physical Exam  He appears healthy and well-nourished in no acute distress  There is trace edema at the sock line bilaterally.  Pedal pulses are excellent.  Homans sign is negative  Range of motion of the ankles is full and equal  He has a 4 x 1 cm cystic structure which is fluctuant and which transilluminates. There is a  contiguous small lesion posteriorly to the 2 larger anterior cystic lesions  Nail health is good  No significant skin lesions over the feet        Assessment & Plan:  #1 synovial cyst without rupture at the left lateral malleolar area  Plan: This should be addressed by an Orthopedist

## 2011-11-11 ENCOUNTER — Encounter (INDEPENDENT_AMBULATORY_CARE_PROVIDER_SITE_OTHER): Payer: Self-pay | Admitting: Surgery

## 2011-11-11 ENCOUNTER — Ambulatory Visit (INDEPENDENT_AMBULATORY_CARE_PROVIDER_SITE_OTHER): Payer: Medicare Other | Admitting: Surgery

## 2011-11-11 VITALS — BP 128/68 | HR 70 | Temp 97.4°F | Resp 16 | Ht 74.5 in | Wt 226.4 lb

## 2011-11-11 DIAGNOSIS — K4090 Unilateral inguinal hernia, without obstruction or gangrene, not specified as recurrent: Secondary | ICD-10-CM

## 2011-11-11 DIAGNOSIS — K429 Umbilical hernia without obstruction or gangrene: Secondary | ICD-10-CM

## 2011-11-11 NOTE — Progress Notes (Signed)
General Surgery [facility]  Visit Diagnoses: 1. Umbilical hernia, reducible   2. Inguinal hernia unilateral, non-recurrent, right     HISTORY: Patient returns for her first postoperative visit having undergone repair of right inguinal hernia with mesh and concurrent repair of umbilical hernia with mesh on 10/24/2011. Postoperative course had significant pain for the first 3-4 days. That has now improved greatly. Patient's bowel and bladder function is normal. He is anxious to return to physical activity.  EXAM: Incisions are well-healed. Steri-Strips are removed in the office today. No sign of infection. Moderate soft tissue swelling remains. Palpation in the inguinal canal with cough and Valsalva shows no sign of recurrence palpation of the umbilicus with Valsalva and cough shows no sign of recurrence.  IMPRESSION: Status post repair of right inguinal hernia and umbilical hernia, no significant postoperative complications  PLAN: Patient will return in 6 weeks for final wound check. He and I discussed the level of physical activity over the coming weeks. He will begin applying topical creams to his incisions.  Velora Heckler, MD, FACS General & Endocrine Surgery [facility]

## 2011-11-11 NOTE — Patient Instructions (Signed)
  COCOA BUTTER & VITAMIN E CREAM  ([employer]mer[employer] or other brand)  Apply cocoa butter/vitamin E cream to your incision 2 - 3 times daily.  Massage cream into incision for one minute with each application.  Use sunscreen (50 SPF or higher) for first 6 months after surgery if area is exposed to sun.  You may substitute Mederma or other scar reducing creams as desired.   

## 2011-12-21 ENCOUNTER — Other Ambulatory Visit: Payer: Self-pay | Admitting: Internal Medicine

## 2011-12-23 ENCOUNTER — Ambulatory Visit (INDEPENDENT_AMBULATORY_CARE_PROVIDER_SITE_OTHER): Payer: Medicare Other | Admitting: Surgery

## 2011-12-23 ENCOUNTER — Encounter (INDEPENDENT_AMBULATORY_CARE_PROVIDER_SITE_OTHER): Payer: Self-pay | Admitting: Surgery

## 2011-12-23 VITALS — BP 130/58 | HR 60 | Temp 97.0°F | Resp 18 | Ht 74.0 in | Wt 226.0 lb

## 2011-12-23 DIAGNOSIS — K429 Umbilical hernia without obstruction or gangrene: Secondary | ICD-10-CM

## 2011-12-23 DIAGNOSIS — K4090 Unilateral inguinal hernia, without obstruction or gangrene, not specified as recurrent: Secondary | ICD-10-CM

## 2011-12-23 NOTE — Patient Instructions (Signed)
  COCOA BUTTER & VITAMIN E CREAM  ([employer]mer[employer] or other brand)  Apply cocoa butter/vitamin E cream to your incision 2 - 3 times daily.  Massage cream into incision for one minute with each application.  Use sunscreen (50 SPF or higher) for first 6 months after surgery if area is exposed to sun.  You may substitute Mederma or other scar reducing creams as desired.   

## 2011-12-23 NOTE — Telephone Encounter (Signed)
Rx sent.    MW 

## 2011-12-23 NOTE — Progress Notes (Signed)
General Surgery [facility]  Visit Diagnoses: 1. Umbilical hernia, reducible   2. Inguinal hernia unilateral, non-recurrent, right     HISTORY: Patient returns for her second postoperative visit having undergone repair of umbilical hernia and right inguinal hernia in early October 2013. Postoperative course was been uneventful. He he continues to do quite well.  EXAM: Surgical incisions are well healed. No sign of seroma or infection. With Valsalva and cough there is no sign of recurrent hernia.  IMPRESSION: Status post umbilical hernia repair and right inguinal hernia repair with mesh  PLAN: Patient is released to full activity without restriction. He will continue to apply topical creams to his incisions. He will return to see me as needed.  Velora Heckler, MD, FACS General & Endocrine Surgery [facility]

## 2011-12-31 ENCOUNTER — Encounter: Payer: Self-pay | Admitting: Family Medicine

## 2011-12-31 ENCOUNTER — Ambulatory Visit (INDEPENDENT_AMBULATORY_CARE_PROVIDER_SITE_OTHER): Payer: Medicare Other | Admitting: Family Medicine

## 2011-12-31 VITALS — BP 136/58 | HR 77 | Temp 98.1°F | Wt 232.0 lb

## 2011-12-31 DIAGNOSIS — N390 Urinary tract infection, site not specified: Secondary | ICD-10-CM

## 2011-12-31 LAB — POCT URINALYSIS DIPSTICK
Glucose, UA: NEGATIVE
Nitrite, UA: POSITIVE
Spec Grav, UA: >=1.03
pH, UA: 5

## 2011-12-31 MED ORDER — CIPROFLOXACIN HCL 500 MG PO TABS: 500.0000 mg | ORAL_TABLET | Freq: Two times a day (BID) | ORAL | Status: DC

## 2011-12-31 NOTE — Patient Instructions (Signed)
Urinary Tract Infection Urinary tract infections (UTIs) can develop anywhere along your urinary tract. Your urinary tract is your body's drainage system for removing wastes and extra water. Your urinary tract includes two kidneys, two ureters, a bladder, and a urethra. Your kidneys are a pair of bean-shaped organs. Each kidney is about the size of your fist. They are located below your ribs, one on each side of your spine. CAUSES Infections are caused by microbes, which are microscopic organisms, including fungi, viruses, and bacteria. These organisms are so small that they can only be seen through a microscope. Bacteria are the microbes that most commonly cause UTIs. SYMPTOMS  Symptoms of UTIs may vary by age and gender of the patient and by the location of the infection. Symptoms in young women typically include a frequent and intense urge to urinate and a painful, burning feeling in the bladder or urethra during urination. Older women and men are more likely to be tired, shaky, and weak and have muscle aches and abdominal pain. A fever may mean the infection is in your kidneys. Other symptoms of a kidney infection include pain in your back or sides below the ribs, nausea, and vomiting. DIAGNOSIS To diagnose a UTI, your caregiver will ask you about your symptoms. Your caregiver also will ask to provide a urine sample. The urine sample will be tested for bacteria and white blood cells. White blood cells are made by your body to help fight infection. TREATMENT  Typically, UTIs can be treated with medication. Because most UTIs are caused by a bacterial infection, they usually can be treated with the use of antibiotics. The choice of antibiotic and length of treatment depend on your symptoms and the type of bacteria causing your infection. HOME CARE INSTRUCTIONS  If you were prescribed antibiotics, take them exactly as your caregiver instructs you. Finish the medication even if you feel better after you  have only taken some of the medication.  Drink enough water and fluids to keep your urine clear or pale yellow.  Avoid caffeine, tea, and carbonated beverages. They tend to irritate your bladder.  Empty your bladder often. Avoid holding urine for long periods of time.  Empty your bladder before and after sexual intercourse.  After a bowel movement, women should cleanse from front to back. Use each tissue only once. SEEK MEDICAL CARE IF:   You have back pain.  You develop a fever.  Your symptoms do not begin to resolve within 3 days. SEEK IMMEDIATE MEDICAL CARE IF:   You have severe back pain or lower abdominal pain.  You develop chills.  You have nausea or vomiting.  You have continued burning or discomfort with urination. MAKE SURE YOU:   Understand these instructions.  Will watch your condition.  Will get help right away if you are not doing well or get worse. Document Released: 10/17/2004 Document Revised: 07/09/2011 Document Reviewed: 02/15/2011 [employer] Patient Information 2013 [employer].  

## 2011-12-31 NOTE — Progress Notes (Signed)
  Subjective:    William Faulkner is a 77 y.o. male who complains of burning with urination, frequency and suprapubic pressure. He has had symptoms for 4 days. Patient also complains of no other symptoms. Patient denies back pain, congestion, cough, fever, headache, rhinitis, sorethroat and stomach ache. Patient does not have a history of recurrent UTI. Patient does not have a history of pyelonephritis. No hx of kidney stones  The following portions of the patient's history were reviewed and updated as appropriate: allergies, current medications, past family history, past medical history, past social history, past surgical history and problem list.  Review of Systems Pertinent items are noted in HPI.    Objective:    BP 136/58  Pulse 77  Temp 98.1 F (36.7 C) (Oral)  Wt 232 lb (105.235 kg)  SpO2 98% General appearance: alert, cooperative, appears stated age and no distress Abdomen: soft, non-tender; bowel sounds normal; no masses,  no organomegaly Psych-- normal  Laboratory:  Urine dipstick: negative for glucose, large for hemoglobin, sm for ketones, mod for leukocyte esterase, pos for nitrites, 300 for protein and 1+ for urobilinogen.   Micro exam: not done.    Assessment:    Acute cystitis and UTI     Plan:    Medications: ciprofloxacin. Maintain adequate hydration. Follow up if symptoms not improving, and as needed.  Repeat urine in 2 weeks

## 2012-01-03 LAB — URINE CULTURE: Colony Count: >=100000

## 2012-03-17 ENCOUNTER — Other Ambulatory Visit: Payer: Self-pay | Admitting: Internal Medicine

## 2012-03-17 NOTE — Telephone Encounter (Signed)
a1c 250.00  

## 2012-04-27 ENCOUNTER — Other Ambulatory Visit: Payer: Self-pay | Admitting: Internal Medicine

## 2012-06-04 ENCOUNTER — Other Ambulatory Visit: Payer: Self-pay | Admitting: Cardiology

## 2012-07-08 ENCOUNTER — Other Ambulatory Visit: Payer: Self-pay | Admitting: Internal Medicine

## 2012-07-08 NOTE — Telephone Encounter (Signed)
A1c 250.00 

## 2012-08-11 ENCOUNTER — Other Ambulatory Visit: Payer: Self-pay | Admitting: Internal Medicine

## 2012-08-11 ENCOUNTER — Other Ambulatory Visit: Payer: Self-pay | Admitting: *Deleted

## 2012-08-11 DIAGNOSIS — E119 Type 2 diabetes mellitus without complications: Secondary | ICD-10-CM

## 2012-08-11 MED ORDER — METFORMIN HCL 850 MG PO TABS: ORAL_TABLET | ORAL | Status: DC

## 2012-08-11 NOTE — Telephone Encounter (Signed)
Rx has been filled for 30 day 60 ct no refills patient is aware of labs.

## 2012-08-11 NOTE — Telephone Encounter (Signed)
I called patient and spoke with his wife and she stated that she was unaware of the patient needing labs. However she said she would take him tomorrow to have them done at [facility] in [place].

## 2012-08-12 ENCOUNTER — Other Ambulatory Visit (INDEPENDENT_AMBULATORY_CARE_PROVIDER_SITE_OTHER): Payer: Medicare Other

## 2012-08-12 DIAGNOSIS — E119 Type 2 diabetes mellitus without complications: Secondary | ICD-10-CM

## 2012-08-12 LAB — HEMOGLOBIN A1C: Hgb A1c MFr Bld: 6.7 % — ABNORMAL HIGH (ref 4.6–6.5)

## 2012-08-12 LAB — MICROALBUMIN / CREATININE URINE RATIO
Creatinine,U: 31 mg/dL
Microalb, Ur: 0.2 mg/dL (ref 0.0–1.9)

## 2012-08-13 ENCOUNTER — Encounter: Payer: Self-pay | Admitting: *Deleted

## 2012-08-14 ENCOUNTER — Ambulatory Visit (INDEPENDENT_AMBULATORY_CARE_PROVIDER_SITE_OTHER): Payer: Medicare Other | Admitting: Cardiology

## 2012-08-14 ENCOUNTER — Encounter: Payer: Self-pay | Admitting: Cardiology

## 2012-08-14 VITALS — BP 162/65 | HR 52 | Ht 74.0 in | Wt 230.4 lb

## 2012-08-14 DIAGNOSIS — I25810 Atherosclerosis of coronary artery bypass graft(s) without angina pectoris: Secondary | ICD-10-CM

## 2012-08-14 DIAGNOSIS — E785 Hyperlipidemia, unspecified: Secondary | ICD-10-CM

## 2012-08-14 DIAGNOSIS — I719 Aortic aneurysm of unspecified site, without rupture: Secondary | ICD-10-CM

## 2012-08-14 DIAGNOSIS — I10 Essential (primary) hypertension: Secondary | ICD-10-CM

## 2012-08-14 DIAGNOSIS — I679 Cerebrovascular disease, unspecified: Secondary | ICD-10-CM

## 2012-08-14 DIAGNOSIS — I359 Nonrheumatic aortic valve disorder, unspecified: Secondary | ICD-10-CM

## 2012-08-14 NOTE — Assessment & Plan Note (Signed)
Blood pressure is elevated today but he follows this closely at home. It typically runs 125 to 130. We will follow and adjust as needed.

## 2012-08-14 NOTE — Assessment & Plan Note (Signed)
Repeat CTA of thoracic aorta in September of 2014.

## 2012-08-14 NOTE — Progress Notes (Signed)
William Faulkner is a pleasant gentleman who has a history of CAD, aortic insufficiency, and cerebrovascular disease. A cardiac catheterization in May of 2011 revealed severe three-vessel coronary artery disease and normal LV function. He subsequently underwent coronary artery bypass and graft in May of 2011(left internal mammary artery to left anterior descending artery, saphenous vein graft to first diagonal, saphenous vein graft obtuse marginal 1, sequential saphenous vein graft to distal right coronary and posterior descending artery). Note an intraoperative transesophageal echocardiogram showed mild to moderate aortic insufficiency and a dilated aortic root. However this was felt not to require surgical intervention at that time. He also had carotid Dopplers last performed in Sept 2012. He had a 40-59% right and 0- 39% left; chronically occluded right vertebral. F/U recommended in 2 years. He had his last echocardiogram in March 2013. His ejection fraction was 65-70%. There was grade 1 diastolic dysfunction. There was mild biatrial enlargement, moderate aortic insufficiency and mild mitral regurgitation. Last chest CT in Sept 2013 showed a dilated aortic root (4.3 cm). I last saw him in Feb 2013. Since then, the patient denies any dyspnea on exertion, orthopnea, PND, pedal edema, palpitations, syncope or exertional chest pain.    Current Outpatient Prescriptions  Medication Sig Dispense Refill  . amoxicillin (AMOXIL) 500 MG capsule Take 500 mg by mouth. 4 pills 1 hour pre dental work      . aspirin 81 MG tablet Take 81 mg by mouth daily.        . benazepril (LOTENSIN) 40 MG tablet TAKE ONE TABLET BY MOUTH ONE TIME DAILY  90 tablet  1  . carvedilol (COREG) 12.5 MG tablet Take 12.5 mg by mouth 2 (two) times daily with a meal.      . CRESTOR 20 MG tablet TAKE 1 TABLET DAILY  90 each  3  . metFORMIN (GLUCOPHAGE) 850 MG tablet LABS DUE, 1 by mouth twice daily with two largest meals  60 tablet  0  .  timolol (TIMOPTIC) 0.5 % ophthalmic solution Place 1 drop into both eyes every morning. As directed       No current facility-administered medications for this visit.     Past Medical History  Diagnosis Date  . Other and unspecified hyperlipidemia   . HYPERTENSION, ESSENTIAL NOS   . CEREBROVASCULAR DISEASE   . CAD, ARTERY BYPASS GRAFT   . AORTIC INSUFFICIENCY   . AORTIC ANEUR UNSPEC SITE WITHOUT MENTION RUPTURE   . DIAB W/O COMP TYPE II/UNS NOT STATED UNCNTRL   . NEOPLASM, MALIGNANT, BLADDER, HX OF     Past Surgical History  Procedure Laterality Date  . Rotator cuff repair  2009    right  . Bladder tumor excision      Surgery x6, Dr.Tannebaum  . Colonoscopy  2002/2004    Negative  . Lumbar epidural injection  2008  . Colonoscopy w/ polypectomy      Dr [facility]  . Blepharoplasty  1980    bilaterally, Dr Nile Riggs  . Coronary artery bypass graft  2011    x 5  . Back surgery  2003    lower  . Inguinal hernia repair  10/24/2011    Procedure: HERNIA REPAIR INGUINAL ADULT;  Surgeon: Velora Heckler, MD;  Location: [facility];  Service: General;  Laterality: Right;  Repair Right Inguinal Hernia with Mesh, Umbilical Hernia Repair with Mesh  . Umbilical hernia repair  10/24/2011    Procedure: HERNIA REPAIR UMBILICAL ADULT;  Surgeon: Velora Heckler, MD;  Location: [facility];  Service: General;  Laterality: N/A;    History   Social History  . Marital Status: Married    Spouse Name: N/A    Number of Children: N/A  . Years of Education: N/A   Occupational History  . Not on file.   Social History Main Topics  . Smoking status: Former Smoker -- 0.30 packs/day for 15 years    Types: Cigarettes    Quit date: 01/22/1984  . Smokeless tobacco: Never Used  . Alcohol Use: No  . Drug Use: No  . Sexually Active: Not on file   Other Topics Concern  . Not on file   Social History Narrative  . No narrative on file    ROS: Some neck pain and leg pain but no fevers or chills,  productive cough, hemoptysis, dysphasia, odynophagia, melena, hematochezia, dysuria, hematuria, rash, seizure activity, orthopnea, PND, pedal edema, claudication. Remaining systems are negative.  Physical Exam: Well-developed well-nourished in no acute distress.  Skin is warm and dry.  HEENT is normal.  Neck is supple.  Chest is clear to auscultation with normal expansion.  Cardiovascular exam is regular rate and rhythm. 2/6 diastolic murmur left sternal border. Abdominal exam nontender or distended. No masses palpated. Extremities show no edema. neuro grossly intact  ECG sinus bradycardia with first degree AV block. Right bundle branch block.

## 2012-08-14 NOTE — Assessment & Plan Note (Signed)
Continue statin. 

## 2012-08-14 NOTE — Assessment & Plan Note (Signed)
Continue aspirin and statin. Followup carotid Dopplers September 2014. 

## 2012-08-14 NOTE — Patient Instructions (Signed)
Your physician wants you to follow-up in: ONE YEAR WITH DR Shelda Pal will receive a reminder letter in the mail two months in advance. If you don't receive a letter, please call our office to schedule the follow-up appointment.   Your physician has requested that you have an echocardiogram. Echocardiography is a painless test that uses sound waves to create images of your heart. It provides your doctor with information about the size and shape of your heart and how well your heart's chambers and valves are working. This procedure takes approximately one hour. There are no restrictions for this procedure.   Your physician has requested that you have a carotid duplex. This test is an ultrasound of the carotid arteries in your neck. It looks at blood flow through these arteries that supply the brain with blood. Allow one hour for this exam. There are no restrictions or special instructions. DUE IN September

## 2012-08-14 NOTE — Assessment & Plan Note (Signed)
Continue aspirin and statin. 

## 2012-08-14 NOTE — Assessment & Plan Note (Signed)
Plan follow-up echocardiogram. 

## 2012-08-24 ENCOUNTER — Ambulatory Visit (HOSPITAL_COMMUNITY): Payer: Medicare Other | Attending: Cardiology | Admitting: Radiology

## 2012-08-24 DIAGNOSIS — I25810 Atherosclerosis of coronary artery bypass graft(s) without angina pectoris: Secondary | ICD-10-CM

## 2012-08-24 DIAGNOSIS — I359 Nonrheumatic aortic valve disorder, unspecified: Secondary | ICD-10-CM | POA: Insufficient documentation

## 2012-08-24 NOTE — Progress Notes (Signed)
Echocardiogram performed.  

## 2012-08-26 ENCOUNTER — Other Ambulatory Visit: Payer: Self-pay

## 2012-09-02 HISTORY — PX: CYSTOSCOPY: SUR368

## 2012-09-18 ENCOUNTER — Other Ambulatory Visit: Payer: Self-pay | Admitting: *Deleted

## 2012-09-18 ENCOUNTER — Other Ambulatory Visit: Payer: Self-pay | Admitting: Internal Medicine

## 2012-09-18 MED ORDER — METFORMIN HCL 850 MG PO TABS: ORAL_TABLET | ORAL | Status: DC

## 2012-09-18 NOTE — Telephone Encounter (Signed)
Rx sent to the pharmacy by e-script.//AB/CMA 

## 2012-09-18 NOTE — Telephone Encounter (Signed)
Rx has been refilled for metformin.  Ag cma

## 2012-09-28 ENCOUNTER — Encounter (INDEPENDENT_AMBULATORY_CARE_PROVIDER_SITE_OTHER): Payer: Medicare Other

## 2012-09-28 DIAGNOSIS — I6529 Occlusion and stenosis of unspecified carotid artery: Secondary | ICD-10-CM

## 2012-09-28 DIAGNOSIS — I679 Cerebrovascular disease, unspecified: Secondary | ICD-10-CM

## 2012-10-07 ENCOUNTER — Telehealth: Payer: Self-pay | Admitting: *Deleted

## 2012-10-07 DIAGNOSIS — I77810 Thoracic aortic ectasia: Secondary | ICD-10-CM

## 2012-10-07 NOTE — Telephone Encounter (Signed)
Spoke with pt wife, it is time to schedule the CTA to look at his dilated aortic root. Dr Imelda Pillow office reports the pt had PSA testing in august, so therefore he will require a BMP for testing. Will schedule and call the pt back.

## 2012-10-07 NOTE — Telephone Encounter (Signed)
Spoke with pt wife, CTA 10-14-12 @ 10 am at [address]. He will have bmp drawn prior to the 23rd. NPO 2 hours prior and he was told to hold his metformin.

## 2012-10-12 ENCOUNTER — Other Ambulatory Visit (INDEPENDENT_AMBULATORY_CARE_PROVIDER_SITE_OTHER): Payer: Medicare Other

## 2012-10-12 DIAGNOSIS — I77810 Thoracic aortic ectasia: Secondary | ICD-10-CM

## 2012-10-12 LAB — BASIC METABOLIC PANEL
BUN: 26 mg/dL — ABNORMAL HIGH (ref 6–23)
Calcium: 9.4 mg/dL (ref 8.4–10.5)
Chloride: 104 mEq/L (ref 96–112)
Creatinine, Ser: 1.2 mg/dL (ref 0.4–1.5)

## 2012-10-14 ENCOUNTER — Encounter: Payer: Self-pay | Admitting: Internal Medicine

## 2012-10-14 ENCOUNTER — Ambulatory Visit (INDEPENDENT_AMBULATORY_CARE_PROVIDER_SITE_OTHER): Payer: Medicare Other | Admitting: Internal Medicine

## 2012-10-14 ENCOUNTER — Ambulatory Visit (INDEPENDENT_AMBULATORY_CARE_PROVIDER_SITE_OTHER)
Admission: RE | Admit: 2012-10-14 | Discharge: 2012-10-14 | Disposition: A | Discharge status: Home / Self Care | Payer: Medicare Other | Source: Ambulatory Visit | Attending: Cardiology | Admitting: Cardiology

## 2012-10-14 VITALS — BP 130/58 | HR 55 | Temp 98.7°F | Resp 12 | Ht 73.0 in | Wt 228.8 lb

## 2012-10-14 DIAGNOSIS — I77810 Thoracic aortic ectasia: Secondary | ICD-10-CM

## 2012-10-14 DIAGNOSIS — M6250 Muscle wasting and atrophy, not elsewhere classified, unspecified site: Secondary | ICD-10-CM

## 2012-10-14 DIAGNOSIS — M542 Cervicalgia: Secondary | ICD-10-CM

## 2012-10-14 MED ORDER — TRAMADOL HCL 50 MG PO TABS: 50.0000 mg | ORAL_TABLET | Freq: Four times a day (QID) | ORAL | Status: DC | PRN

## 2012-10-14 MED ORDER — IOHEXOL 350 MG/ML SOLN
100.0000 mL | Freq: Once | INTRAVENOUS | Status: AC | PRN
  Administered 2012-10-14: 100 mL via INTRAVENOUS

## 2012-10-14 NOTE — Patient Instructions (Addendum)
Use a cervical memory foam pillow to prevent hyperextension or hyperflexion of the cervical spine.  Use an anti-inflammatory cream such as Aspercreme or Zostrix cream twice a day to the affected area as needed. In lieu of this warm moist compresses or  hot water bottle can be used. Do not apply ice . 

## 2012-10-14 NOTE — Progress Notes (Signed)
  Subjective:    Patient ID: William Faulkner, male    DOB: 04/17/1934, 78 y.o.   MRN: [id number]  HPI  Symptoms began over 2 months ago as pain in the left neck/shoulder area described as intermittent and throbbing, up to a level VIII. It will last minutes. It tends to be aggravated by left lateral rotation of the neck and playing golf.  Anti-inflammatory creams topically and Tylenol have been of minimal if any benefit  He has no past history of injury, surgery, or intervention in this area.  He is R handed.    Review of Systems  He denies fever, chills, sweats, weight loss.  He also denies numbness, tingling, weakness in the upper extremities.  There's been no associated incontinence of urine or stool.     Objective:   Physical Exam Gen.: Healthy and well-nourished in appearance. Alert, appropriate and cooperative throughout exam.Appears younger than stated age  Head: Normocephalic without obvious abnormalities  Eyes: No corneal or conjunctival inflammation noted. Pupils equal round reactive to light and accommodation. FOV normal. Extraocular motion intact. Vision grossly normal without lenses. Ptosis  Mouth: Oral mucosa and oropharynx reveal no lesions or exudates. Tongue not deviated. Neck: No deformities, masses, or tenderness noted. Range of motion decreased. Thyroid normal. Lungs: Normal respiratory effort; chest expands symmetrically. Lungs are clear to auscultation without rales, wheezes, or increased work of breathing. Heart: Normal rate and rhythm. Normal S1 and S2. No gallop, click, or rub. To & fro R basilar murmur.                              Musculoskeletal/extremities: No deformity or scoliosis noted of  the thoracic or lumbar spine.   There is some asymmetry of the upper thoracic musculature with atrophy of the right super scapular musculature. No clubbing, cyanosis, edema, or significant extremity  deformity noted. Range of motion normal .Tone & strength   Normal. Joints normal . Nail health good.  Vascular: Carotid, radial artery pulses are full and equal. Faint L carotid bruit vs murmur radiation present. Neurologic: Alert and oriented x3. Deep tendon reflexes symmetrical and normal. No cranial nerve deficit present Gait normal  .        Skin: Intact without suspicious lesions or rashes. Lymph: No cervical, axillary lymphadenopathy present. Psych: Mood and affect are normal. Normally interactive                                                                                        Assessment & Plan:  #1 cervical and shoulder area pain on the left with decreased range of motion of the neck. No neurologic deficit present  #2 Asymmetry of the suprascapular musculature; right smaller than left  See orders

## 2012-10-16 ENCOUNTER — Ambulatory Visit (INDEPENDENT_AMBULATORY_CARE_PROVIDER_SITE_OTHER)
Admission: RE | Admit: 2012-10-16 | Discharge: 2012-10-16 | Disposition: A | Discharge status: Home / Self Care | Payer: Medicare Other | Source: Ambulatory Visit | Attending: Internal Medicine | Admitting: Internal Medicine

## 2012-10-16 DIAGNOSIS — M542 Cervicalgia: Secondary | ICD-10-CM

## 2012-10-21 ENCOUNTER — Encounter: Payer: Self-pay | Admitting: Internal Medicine

## 2012-10-21 DIAGNOSIS — M4712 Other spondylosis with myelopathy, cervical region: Secondary | ICD-10-CM

## 2012-10-27 ENCOUNTER — Other Ambulatory Visit: Payer: Self-pay | Admitting: Internal Medicine

## 2012-10-27 NOTE — Telephone Encounter (Signed)
Benazepril refill sent to pharmacy 

## 2012-11-10 ENCOUNTER — Other Ambulatory Visit: Payer: Self-pay | Admitting: Cardiology

## 2012-11-23 ENCOUNTER — Other Ambulatory Visit: Payer: Self-pay | Admitting: Cardiology

## 2012-11-26 ENCOUNTER — Other Ambulatory Visit: Payer: Self-pay

## 2012-12-17 ENCOUNTER — Encounter: Payer: Self-pay | Admitting: Internal Medicine

## 2012-12-17 DIAGNOSIS — M47812 Spondylosis without myelopathy or radiculopathy, cervical region: Secondary | ICD-10-CM | POA: Insufficient documentation

## 2012-12-21 ENCOUNTER — Telehealth: Payer: Self-pay | Admitting: Internal Medicine

## 2012-12-21 ENCOUNTER — Other Ambulatory Visit: Payer: Self-pay | Admitting: *Deleted

## 2012-12-21 DIAGNOSIS — E119 Type 2 diabetes mellitus without complications: Secondary | ICD-10-CM

## 2012-12-21 NOTE — Telephone Encounter (Signed)
Order for A1c entered.

## 2012-12-21 NOTE — Telephone Encounter (Signed)
Patient is due to have is A1c checked and wants to have it done at [facility]. Needs orders.

## 2012-12-28 ENCOUNTER — Other Ambulatory Visit (INDEPENDENT_AMBULATORY_CARE_PROVIDER_SITE_OTHER): Payer: Medicare Other

## 2012-12-28 ENCOUNTER — Other Ambulatory Visit: Payer: Self-pay | Admitting: Internal Medicine

## 2012-12-28 DIAGNOSIS — E1059 Type 1 diabetes mellitus with other circulatory complications: Secondary | ICD-10-CM

## 2012-12-28 DIAGNOSIS — E119 Type 2 diabetes mellitus without complications: Secondary | ICD-10-CM

## 2012-12-28 LAB — HEMOGLOBIN A1C: Hgb A1c MFr Bld: 6.7 % — ABNORMAL HIGH (ref 4.6–6.5)

## 2013-02-02 ENCOUNTER — Other Ambulatory Visit: Payer: Self-pay | Admitting: Internal Medicine

## 2013-02-03 NOTE — Telephone Encounter (Signed)
Metformin refilled per protocol. JG//CMA

## 2013-02-05 ENCOUNTER — Other Ambulatory Visit: Payer: Self-pay | Admitting: *Deleted

## 2013-02-05 MED ORDER — METFORMIN HCL 850 MG PO TABS: ORAL_TABLET | ORAL | Status: DC

## 2013-02-22 ENCOUNTER — Telehealth: Payer: Self-pay

## 2013-02-22 NOTE — Telephone Encounter (Signed)
Rescheduled

## 2013-02-24 ENCOUNTER — Encounter: Payer: Medicare Other | Admitting: Internal Medicine

## 2013-03-02 ENCOUNTER — Telehealth: Payer: Self-pay

## 2013-03-02 NOTE — Telephone Encounter (Signed)
Medication List and allergies:  Reviewed and updated  90 day supply/mail order: na Local prescriptions: [facility] [address]  Immunizations due: Tdap,, PNA, shingles   A/P:   No changes to FH, PSH or Personal Hx Flu vaccine--10/2012 Tdap--unsure of last ne PNA--never had Shingles--never had CCS--Recall letter from Dr Henrene Pastor from 2012 and 2013/unable to locate last report/patient doesn't know when it was PSA--05/2012--2.02---sees Urology  To Discuss with Provider: Rash on thigh x 2 weeks--derm referral

## 2013-03-03 ENCOUNTER — Encounter: Payer: Self-pay | Admitting: Internal Medicine

## 2013-03-03 ENCOUNTER — Ambulatory Visit (INDEPENDENT_AMBULATORY_CARE_PROVIDER_SITE_OTHER): Payer: Medicare Other | Admitting: Internal Medicine

## 2013-03-03 VITALS — BP 189/71 | HR 60 | Temp 98.2°F | Ht 73.8 in | Wt 232.0 lb

## 2013-03-03 DIAGNOSIS — E1159 Type 2 diabetes mellitus with other circulatory complications: Secondary | ICD-10-CM

## 2013-03-03 DIAGNOSIS — I25810 Atherosclerosis of coronary artery bypass graft(s) without angina pectoris: Secondary | ICD-10-CM

## 2013-03-03 DIAGNOSIS — I10 Essential (primary) hypertension: Secondary | ICD-10-CM

## 2013-03-03 DIAGNOSIS — R259 Unspecified abnormal involuntary movements: Secondary | ICD-10-CM

## 2013-03-03 DIAGNOSIS — E785 Hyperlipidemia, unspecified: Secondary | ICD-10-CM

## 2013-03-03 DIAGNOSIS — R251 Tremor, unspecified: Secondary | ICD-10-CM

## 2013-03-03 MED ORDER — PROPRANOLOL HCL ER 80 MG PO CP24: 80.0000 mg | ORAL_CAPSULE | Freq: Every day | ORAL | Status: DC

## 2013-03-03 MED ORDER — GABAPENTIN 300 MG PO CAPS: ORAL_CAPSULE | ORAL | Status: DC

## 2013-03-03 MED ORDER — NYSTATIN-TRIAMCINOLONE 100000-0.1 UNIT/GM-% EX OINT: 1.0000 "application " | TOPICAL_OINTMENT | Freq: Two times a day (BID) | CUTANEOUS | Status: DC

## 2013-03-03 NOTE — Assessment & Plan Note (Addendum)
last A1c satisfactory. He does have a neuropathy, feet care discussed  Treat neuropathy? patient is interested, will prescribe gabapentin 300 bid

## 2013-03-03 NOTE — Assessment & Plan Note (Addendum)
Long history of hand tremor, on exam it seems mild however patient is quite affected by it and desires treatment . Plan: Discontinue coreg, started Inderal, Watch for somnolence and BP control

## 2013-03-03 NOTE — Assessment & Plan Note (Addendum)
BP elevated today but at home is usually well-controlled. He has tremors and desires treatment, we will discontinue Coreg and start Inderal. Next office visit in 6 weeks.  check a BMP and CBC

## 2013-03-03 NOTE — Progress Notes (Signed)
Pre visit review using our clinic review tool, if applicable. No additional management support is needed unless otherwise documented below in the visit note. 

## 2013-03-03 NOTE — Assessment & Plan Note (Signed)
History of CAD, on exam pulses are slightly decreased, ? ABIs on return to the office.

## 2013-03-03 NOTE — Assessment & Plan Note (Signed)
Good compliance with medications, will come back fasting. FLP, AST, ALT

## 2013-03-03 NOTE — Progress Notes (Signed)
   Subjective:    Patient ID: William Faulkner, male    DOB: 10/28/1934, 79 y.o.   MRN: [id number]  DOS:  03/03/2013  Patient transferring  Dr. Linna Darner, came here to get established here Diabetes-- good compliance with medications, ambulatory blood sugars around 100 Hypertension-as good medication compliance, ambulatory BPs 140, 140. High cholesterol, on Crestor, no apparent side effects History of tremors, they are getting worse lately, he has tremor in both hands, saw neurology years ago, currently symptoms are interfering with ADLs. Rash at the groin, + itch    Past Medical History  Diagnosis Date  . Hyperlipidemia   . HYPERTENSION, ESSENTIAL NOS   . CEREBROVASCULAR DISEASE   . CAD, ARTERY BYPASS GRAFT   . AORTIC INSUFFICIENCY   . AORTIC ANEUR UNSPEC SITE WITHOUT MENTION RUPTURE   . DIAB W/O COMP TYPE II/UNS NOT STATED UNCNTRL   . NEOPLASM, MALIGNANT, BLADDER, HX OF   . BPH (benign prostatic hyperplasia)     Past Surgical History  Procedure Laterality Date  . Rotator cuff repair  2009    right; Dr Shellia Carwin  . Bladder tumor excision      Surgery x6, Dr.Tannebaum  . Colonoscopy  2002/2004    Negative  . Lumbar epidural injection  2008  . Colonoscopy w/ polypectomy      Dr Ree Sh[facility]  . Blepharoplasty  1980    bilaterally, Dr Gershon Crane  . Coronary artery bypass graft  2011    x 5  . Back surgery  2003    lower  . Inguinal hernia repair  10/24/2011    Procedure: HERNIA REPAIR INGUINAL ADULT;  Surgeon: Earnstine Regal, MD;  Location: [facility];  Service: General;  Laterality: Right;  Repair Right Inguinal Hernia with Mesh, Umbilical Hernia Repair with Mesh  . Umbilical hernia repair  10/24/2011    Procedure: HERNIA REPAIR UMBILICAL ADULT;  Surgeon: Earnstine Regal, MD;  Location: [facility];  Service: General;  Laterality: N/A;  . Cystoscopy  09/02/2012    ROS  Diet is healthy most of the time Denies chest pain or difficulty breathing No lower extremity edema No nausea,  vomiting, diarrhea. He does complain of the bottom of his feet burning, symptoms are not worse at night. Occasional has pain in the lower extremities, distal from the knee, with walking but symptoms are not consistent. Claudication?    Objective:   Physical Exam There were no vitals taken for this visit.  General -- alert, well-developed, NAD.  Lungs -- normal respiratory effort, no intercostal retractions, no accessory muscle use, and normal breath sounds.  Heart-- normal rate, regular rhythm, + dyastolic murmur.  Abdomen-- Not distended, good bowel sounds,soft, non-tender. No bruit  Extremities--  Normal femoral pulses Decreased pedal pulses Patchy decrease in sensitivity at the oes on pinprick examination. Skin-- + Rash at the groins, worse on the right, slightly erythematous, more red in the borders with some elevation and scaliness Neurologic--  alert & oriented X3. + fine tremor in the hands Psych-- Cognition and judgment appear intact. Cooperative with normal attention span and concentration. No anxious or depressed appearing.       Assessment & Plan:   The visit was performed while the computers were down, documentation was completed once the patient was  gone from the office.  Rash--- likely mycotic, we'll send Mycolog

## 2013-03-05 ENCOUNTER — Other Ambulatory Visit (INDEPENDENT_AMBULATORY_CARE_PROVIDER_SITE_OTHER): Payer: Medicare Other

## 2013-03-05 DIAGNOSIS — E1059 Type 1 diabetes mellitus with other circulatory complications: Secondary | ICD-10-CM

## 2013-03-05 DIAGNOSIS — E785 Hyperlipidemia, unspecified: Secondary | ICD-10-CM

## 2013-03-05 DIAGNOSIS — I10 Essential (primary) hypertension: Secondary | ICD-10-CM

## 2013-03-05 LAB — COMPREHENSIVE METABOLIC PANEL
ALK PHOS: 39 U/L (ref 39–117)
ALT: 16 U/L (ref 0–53)
AST: 18 U/L (ref 0–37)
Albumin: 3.9 g/dL (ref 3.5–5.2)
BUN: 23 mg/dL (ref 6–23)
CO2: 28 mEq/L (ref 19–32)
CREATININE: 1.1 mg/dL (ref 0.4–1.5)
Calcium: 9.4 mg/dL (ref 8.4–10.5)
Chloride: 107 mEq/L (ref 96–112)
GFR: 71.27 mL/min (ref >60.00)
Glucose, Bld: 106 mg/dL — ABNORMAL HIGH (ref 70–99)
Potassium: 4.9 mEq/L (ref 3.5–5.1)
Sodium: 140 mEq/L (ref 135–145)
Total Bilirubin: 0.8 mg/dL (ref 0.3–1.2)
Total Protein: 6.9 g/dL (ref 6.0–8.3)

## 2013-03-05 LAB — CBC WITH DIFFERENTIAL/PLATELET
BASOS PCT: 0.2 % (ref 0.0–3.0)
Basophils Absolute: 0 10*3/uL (ref 0.0–0.1)
EOS ABS: 0.2 10*3/uL (ref 0.0–0.7)
Eosinophils Relative: 2.9 % (ref 0.0–5.0)
HCT: 44.5 % (ref 39.0–52.0)
Hemoglobin: 14.1 g/dL (ref 13.0–17.0)
Lymphocytes Relative: 25.6 % (ref 12.0–46.0)
Lymphs Abs: 1.9 10*3/uL (ref 0.7–4.0)
MCHC: 31.8 g/dL (ref 30.0–36.0)
MCV: 87.3 fl (ref 78.0–100.0)
MONO ABS: 0.6 10*3/uL (ref 0.1–1.0)
Monocytes Relative: 7.7 % (ref 3.0–12.0)
NEUTROS ABS: 4.6 10*3/uL (ref 1.4–7.7)
Neutrophils Relative %: 63.6 % (ref 43.0–77.0)
Platelets: 210 10*3/uL (ref 150.0–400.0)
RBC: 5.09 Mil/uL (ref 4.22–5.81)
RDW: 13.9 % (ref 11.5–14.6)
WBC: 7.3 10*3/uL (ref 4.5–10.5)

## 2013-03-05 LAB — MICROALBUMIN / CREATININE URINE RATIO
CREATININE, U: 132.6 mg/dL
MICROALB UR: 1.2 mg/dL (ref 0.0–1.9)
Microalb Creat Ratio: 0.9 mg/g (ref 0.0–30.0)

## 2013-03-05 LAB — LIPID PANEL
CHOL/HDL RATIO: 3
Cholesterol: 111 mg/dL (ref 0–200)
HDL: 43.5 mg/dL (ref >39.00)
LDL CALC: 43 mg/dL (ref 0–99)
TRIGLYCERIDES: 124 mg/dL (ref 0.0–149.0)
VLDL: 24.8 mg/dL (ref 0.0–40.0)

## 2013-03-05 LAB — HEMOGLOBIN A1C: HEMOGLOBIN A1C: 6.7 % — AB (ref 4.6–6.5)

## 2013-03-08 ENCOUNTER — Encounter: Payer: Self-pay | Admitting: *Deleted

## 2013-04-14 ENCOUNTER — Ambulatory Visit (INDEPENDENT_AMBULATORY_CARE_PROVIDER_SITE_OTHER): Payer: Medicare Other | Admitting: Internal Medicine

## 2013-04-14 ENCOUNTER — Encounter: Payer: Self-pay | Admitting: Internal Medicine

## 2013-04-14 VITALS — BP 124/80 | HR 60 | Temp 98.1°F | Wt 238.0 lb

## 2013-04-14 DIAGNOSIS — R251 Tremor, unspecified: Secondary | ICD-10-CM

## 2013-04-14 DIAGNOSIS — E1159 Type 2 diabetes mellitus with other circulatory complications: Secondary | ICD-10-CM

## 2013-04-14 DIAGNOSIS — R259 Unspecified abnormal involuntary movements: Secondary | ICD-10-CM

## 2013-04-14 DIAGNOSIS — I10 Essential (primary) hypertension: Secondary | ICD-10-CM

## 2013-04-14 NOTE — Progress Notes (Signed)
Subjective:    Patient ID: William Faulkner, male    DOB: 11-17-1934, 79 y.o.   MRN: [id number]  DOS:  04/14/2013  Type of  visit: Followup from previous visit  The last time he was here, we change to Inderal, tremor has decrease, BP is ranging 150, 140. Today BP is 158. Also started gabapentin for neuropathy, burning on his feet has decreased a  little. No apparent side effects.   ROS Denies chest pain, difficulty breathing. Plays golf and takes walks every other day, no claudication  Past Medical History  Diagnosis Date  . Hyperlipidemia   . HYPERTENSION, ESSENTIAL NOS   . CEREBROVASCULAR DISEASE   . CAD, ARTERY BYPASS GRAFT   . AORTIC INSUFFICIENCY   . AORTIC ANEUR UNSPEC SITE WITHOUT MENTION RUPTURE   . DIAB W/O COMP TYPE II/UNS NOT STATED UNCNTRL   . NEOPLASM, MALIGNANT, BLADDER, HX OF   . BPH (benign prostatic hyperplasia)     Past Surgical History  Procedure Laterality Date  . Rotator cuff repair  2009    right; Dr Shellia Carwin  . Bladder tumor excision      Surgery x6, Dr.Tannebaum  . Colonoscopy  2002/2004    Negative  . Lumbar epidural injection  2008  . Colonoscopy w/ polypectomy      Dr Re[facility]  . Blepharoplasty  1980    bilaterally, Dr Gershon Crane  . Coronary artery bypass graft  2011    x 5  . Back surgery  2003    lower  . Inguinal hernia repair  10/24/2011    Procedure: HERNIA REPAIR INGUINAL ADULT;  Surgeon: Earnstine Regal, MD;  Location: [facility];  Service: General;  Laterality: Right;  Repair Right Inguinal Hernia with Mesh, Umbilical Hernia Repair with Mesh  . Umbilical hernia repair  10/24/2011    Procedure: HERNIA REPAIR UMBILICAL ADULT;  Surgeon: Earnstine Regal, MD;  Location: [facility];  Service: General;  Laterality: N/A;  . Cystoscopy  09/02/2012    History   Social History  . Marital Status: Married    Spouse Name: N/A    Number of Children: 3  . Years of Education: N/A   Occupational History  . retired    Social History Main Topics   . Smoking status: Former Smoker -- 0.30 packs/day for 15 years    Types: Cigarettes    Quit date: 01/22/1984  . Smokeless tobacco: Never Used  . Alcohol Use: No     Comment: rare, wine   . Drug Use: No  . Sexual Activity: Not on file   Other Topics Concern  . Not on file   Social History Narrative   8 G-children   Married x 55 years        Medication List       This list is accurate as of: 04/14/13  7:13 PM.  Always use your most recent med list.               aspirin 81 MG tablet  Take 81 mg by mouth daily.     benazepril 40 MG tablet  Commonly known as:  LOTENSIN  Take one tablet by mouth one time daily     CRESTOR 20 MG tablet  Generic drug:  rosuvastatin  TAKE 1 TABLET DAILY     gabapentin 300 MG capsule  Commonly known as:  NEURONTIN  One tablet at night for one week, then take one tablet twice a day  metFORMIN 850 MG tablet  Commonly known as:  GLUCOPHAGE  take one tablet by mouth twice daily with largest meals.     nystatin-triamcinolone ointment  Commonly known as:  MYCOLOG  Apply 1 application topically 2 (two) times daily.     propranolol ER 80 MG 24 hr capsule  Commonly known as:  INDERAL LA  Take 1 capsule (80 mg total) by mouth daily.     timolol 0.5 % ophthalmic solution  Commonly known as:  TIMOPTIC  Place 1 drop into both eyes every morning. As directed           Objective:   Physical Exam BP 124/80  Pulse 60  Temp(Src) 98.1 F (36.7 C)  Wt 238 lb (107.956 kg)  SpO2 98% General -- alert, well-developed, NAD.  Lungs -- normal respiratory effort, no intercostal retractions, no accessory muscle use, and normal breath sounds.  Heart-- normal rate, regular rhythm, no murmur.  Extremities-- no pretibial edema bilaterally  Neurologic--  alert & oriented X3. Speech normal, gait normal, strength normal in all extremities.    Psych-- Cognition and judgment appear intact. Cooperative with normal attention span and concentration. No  anxious or depressed appearing.       Assessment & Plan:     Today , I spent more than 15 min with the patient, >50% of the time counseling, had ? About BP readings

## 2013-04-14 NOTE — Patient Instructions (Signed)
Check the  blood pressure 2 or 3 times a week be sure it is between 110/60 and 140/85. Ideal blood pressure is 120/80. If it is consistently higher or lower, let me know   Next visit is for a physical exam in 4 months , fasting Please make an appointment

## 2013-04-14 NOTE — Assessment & Plan Note (Signed)
Last A1c satisfactory, recommend to remain active. Neurontin is helping with neuropathy, we talk about change the dose but he prefers to wait for now

## 2013-04-14 NOTE — Progress Notes (Signed)
Pre visit review using our clinic review tool, if applicable. No additional management support is needed unless otherwise documented below in the visit note. 

## 2013-04-14 NOTE — Assessment & Plan Note (Signed)
Week change to have a person Inderal, BP range from 130-140. Gets better readings when he is relaxed. We recheck his BP today, initially 158/65, repeated BP 124/80. Plan: No change

## 2013-04-14 NOTE — Assessment & Plan Note (Signed)
Improved after we change the beta blocker to Inderal

## 2013-04-15 ENCOUNTER — Telehealth: Payer: Self-pay | Admitting: Internal Medicine

## 2013-04-15 NOTE — Telephone Encounter (Signed)
Relevant patient education assigned to patient using Emmi. ° °

## 2013-04-28 ENCOUNTER — Other Ambulatory Visit: Payer: Self-pay | Admitting: Internal Medicine

## 2013-05-07 ENCOUNTER — Other Ambulatory Visit: Payer: Self-pay | Admitting: *Deleted

## 2013-05-07 ENCOUNTER — Other Ambulatory Visit: Payer: Self-pay | Admitting: Internal Medicine

## 2013-05-07 MED ORDER — PROPRANOLOL HCL ER 80 MG PO CP24: ORAL_CAPSULE | ORAL | Status: DC

## 2013-05-31 ENCOUNTER — Ambulatory Visit (INDEPENDENT_AMBULATORY_CARE_PROVIDER_SITE_OTHER): Payer: Medicare Other | Admitting: Internal Medicine

## 2013-05-31 ENCOUNTER — Encounter: Payer: Self-pay | Admitting: Internal Medicine

## 2013-05-31 VITALS — BP 147/56 | HR 52 | Temp 97.9°F | Wt 235.2 lb

## 2013-05-31 DIAGNOSIS — B029 Zoster without complications: Secondary | ICD-10-CM | POA: Insufficient documentation

## 2013-05-31 MED ORDER — VALACYCLOVIR HCL 1 G PO TABS: 1000.0000 mg | ORAL_TABLET | Freq: Three times a day (TID) | ORAL | Status: DC

## 2013-05-31 NOTE — Assessment & Plan Note (Addendum)
Findings consistent with shingles, he had similar episodes in the 22s. Discussed what is shingles potential complications such as postherpetic neuropathy . Plan: Valtrex Currently controlling the pain with Tylenol or Advil, recommend Tylenol. Will call if the pain persists beyond 2 weeks or if the rash spreads. consider increase gabapentin which he takes for other issues

## 2013-05-31 NOTE — Progress Notes (Signed)
Subjective:    Patient ID: William Faulkner, male    DOB: 1934-10-27, 79 y.o.   MRN: [id number]  DOS:  05/31/2013 Type of  visit: Acute visit Symptoms started 4 days ago with rash in the left upper back, now is spreading anteriorly. It does hurt. Patient suspect shingles   ROS Other than that he feels slightly tired but okay. Denies fever, chills,   or any other rash.  Past Medical History  Diagnosis Date  . Hyperlipidemia   . HYPERTENSION, ESSENTIAL NOS   . CEREBROVASCULAR DISEASE   . CAD, ARTERY BYPASS GRAFT   . AORTIC INSUFFICIENCY   . AORTIC ANEUR UNSPEC SITE WITHOUT MENTION RUPTURE   . DIAB W/O COMP TYPE II/UNS NOT STATED UNCNTRL   . NEOPLASM, MALIGNANT, BLADDER, HX OF   . BPH (benign prostatic hyperplasia)     Past Surgical History  Procedure Laterality Date  . Rotator cuff repair  2009    right; Dr Shellia Carwin  . Bladder tumor excision      Surgery x6, Dr.Tannebaum  . Colonoscopy  2002/2004    Negative  . Lumbar epidural injection  2008  . Colonoscopy w/ polypectomy      Dr Ree [facility]ay [facility]  . Blepharoplasty  1980    bilaterally, Dr Gershon Crane  . Coronary artery bypass graft  2011    x 5  . Back surgery  2003    lower  . Inguinal hernia repair  10/24/2011    Procedure: HERNIA REPAIR INGUINAL ADULT;  Surgeon: Earnstine Regal, MD;  Location: [facility];  Service: General;  Laterality: Right;  Repair Right Inguinal Hernia with Mesh, Umbilical Hernia Repair with Mesh  . Umbilical hernia repair  10/24/2011    Procedure: HERNIA REPAIR UMBILICAL ADULT;  Surgeon: Earnstine Regal, MD;  Location: [facility];  Service: General;  Laterality: N/A;  . Cystoscopy  09/02/2012    History   Social History  . Marital Status: Married    Spouse Name: N/A    Number of Children: 3  . Years of Education: N/A   Occupational History  . retired    Social History Main Topics  . Smoking status: Former Smoker -- 0.30 packs/day for 15 years    Types: Cigarettes    Quit date: 01/22/1984  .  Smokeless tobacco: Never Used  . Alcohol Use: No     Comment: rare, wine   . Drug Use: No  . Sexual Activity: Not on file   Other Topics Concern  . Not on file   Social History Narrative   8 G-children   Married x 55 years        Medication List       This list is accurate as of: 05/31/13  7:16 PM.  Always use your most recent med list.               aspirin 81 MG tablet  Take 81 mg by mouth daily.     benazepril 40 MG tablet  Commonly known as:  LOTENSIN  TAKE ONE TABLET BY MOUTH ONE TIME DAILY     CRESTOR 20 MG tablet  Generic drug:  rosuvastatin  TAKE 1 TABLET DAILY     gabapentin 300 MG capsule  Commonly known as:  NEURONTIN  One tablet at night for one week, then take one tablet twice a day     metFORMIN 850 MG tablet  Commonly known as:  GLUCOPHAGE  take one tablet by mouth twice daily with  largest meals.     propranolol ER 80 MG 24 hr capsule  Commonly known as:  INDERAL LA  TAKE ONE CAPSULE BY MOUTH ONE TIME DAILY     timolol 0.5 % ophthalmic solution  Commonly known as:  TIMOPTIC  Place 1 drop into both eyes every morning. As directed     valACYclovir 1000 MG tablet  Commonly known as:  VALTREX  Take 1 tablet (1,000 mg total) by mouth 3 (three) times daily.           Objective:   Physical Exam  Constitutional: He appears well-developed and well-nourished. No distress.  Skin: He is not diaphoretic.      BP 147/56  Pulse 52  Temp(Src) 97.9 F (36.6 C) (Oral)  Wt 235 lb 3.2 oz (106.686 kg)  SpO2 98%       Assessment & Plan:

## 2013-05-31 NOTE — Patient Instructions (Signed)
Start Valtrex as soon as possible  Tylenol  500 mg OTC 2 tabs a day every 8 hours as needed for pain  Call if the rash gets a lot worse or if the pain persists beyond 2 weeks  Next visit by 07-2013   Shingles Shingles (herpes zoster) is an infection that is caused by the same virus that causes chickenpox (varicella). The infection causes a painful skin rash and fluid-filled blisters, which eventually break open, crust over, and heal. It may occur in any area of the body, but it usually affects only one side of the body or face. The pain of shingles usually lasts about 1 month. However, some people with shingles may develop long-term (chronic) pain in the affected area of the body. Shingles often occurs many years after the person had chickenpox. It is more common:  In people older than 50 years.  In people with weakened immune systems, such as those with HIV, AIDS, or cancer.  In people taking medicines that weaken the immune system, such as transplant medicines.  In people under great stress. CAUSES  Shingles is caused by the varicella zoster virus (VZV), which also causes chickenpox. After a person is infected with the virus, it can remain in the person's body for years in an inactive state (dormant). To cause shingles, the virus reactivates and breaks out as an infection in a nerve root. The virus can be spread from person to person (contagious) through contact with open blisters of the shingles rash. It will only spread to people who have not had chickenpox. When these people are exposed to the virus, they may develop chickenpox. They will not develop shingles. Once the blisters scab over, the person is no longer contagious and cannot spread the virus to others. SYMPTOMS  Shingles shows up in stages. The initial symptoms may be pain, itching, and tingling in an area of the skin. This pain is usually described as burning, stabbing, or throbbing.In a few days or weeks, a painful red rash  will appear in the area where the pain, itching, and tingling were felt. The rash is usually on one side of the body in a band or belt-like pattern. Then, the rash usually turns into fluid-filled blisters. They will scab over and dry up in approximately 2 3 weeks. Flu-like symptoms may also occur with the initial symptoms, the rash, or the blisters. These may include:  Fever.  Chills.  Headache.  Upset stomach. DIAGNOSIS  Your caregiver will perform a skin exam to diagnose shingles. Skin scrapings or fluid samples may also be taken from the blisters. This sample will be examined under a microscope or sent to a lab for further testing. TREATMENT  There is no specific cure for shingles. Your caregiver will likely prescribe medicines to help you manage the pain, recover faster, and avoid long-term problems. This may include antiviral drugs, anti-inflammatory drugs, and pain medicines. HOME CARE INSTRUCTIONS   Take a cool bath or apply cool compresses to the area of the rash or blisters as directed. This may help with the pain and itching.   Only take over-the-counter or prescription medicines as directed by your caregiver.   Rest as directed by your caregiver.  Keep your rash and blisters clean with mild soap and cool water or as directed by your caregiver.  Do not pick your blisters or scratch your rash. Apply an anti-itch cream or numbing creams to the affected area as directed by your caregiver.  Keep your shingles rash  covered with a loose bandage (dressing).  Avoid skin contact with:  Babies.   Pregnant women.   Children with eczema.   Elderly people with transplants.   People with chronic illnesses, such as leukemia or AIDS.   Wear loose-fitting clothing to help ease the pain of material rubbing against the rash.  Keep all follow-up appointments with your caregiver.If the area involved is on your face, you may receive a referral for follow-up to a specialist,  such as an eye doctor (ophthalmologist) or an ear, nose, and throat (ENT) doctor. Keeping all follow-up appointments will help you avoid eye complications, chronic pain, or disability.  SEEK IMMEDIATE MEDICAL CARE IF:   You have facial pain, pain around the eye area, or loss of feeling on one side of your face.  You have ear pain or ringing in your ear.  You have loss of taste.  Your pain is not relieved with prescribed medicines.   Your redness or swelling spreads.   You have more pain and swelling.  Your condition is worsening or has changed.   You have a feveror persistent symptoms for more than 2 3 days.  You have a fever and your symptoms suddenly get worse. MAKE SURE YOU:  Understand these instructions.  Will watch your condition.  Will get help right away if you are not doing well or get worse. Document Released: 01/07/2005 Document Revised: 10/02/2011 Document Reviewed: 08/22/2011 [facility] Patient Information 2014 [place].

## 2013-05-31 NOTE — Progress Notes (Signed)
Pre visit review using our clinic review tool, if applicable. No additional management support is needed unless otherwise documented below in the visit note. 

## 2013-06-02 ENCOUNTER — Telehealth: Payer: Self-pay | Admitting: Internal Medicine

## 2013-06-02 NOTE — Telephone Encounter (Signed)
Caller name: Flora Relation to pt: wife Call back number:  [phone] Pharmacy:  Reason for call: pt was seen on 5/13 and dx with shingles.  Pt's wife states that his worse has gotten worse.  They want to know what to do.  Please advise

## 2013-06-02 NOTE — Telephone Encounter (Signed)
Wife states that her husband is in a lot of pain. He is taking the tylenol q 4 hours with not a lot of relief. States that he is taking the valtrex as prescribed. The area is still located like a band around the back and chest area.  Wife is upset because she feels that everything has changed so much and that they have problems with issues concerning the "procedures" not the care. Apologized for the problems that she has experienced and encouraged her to let us know where things need adjustments.   Please advise on the patients pain situation.

## 2013-06-02 NOTE — Telephone Encounter (Signed)
Gaye, Please call the patient, if the rash is getting slightly worse that may be expected, if  is going beyond the  dermatome or is he having systemic symptoms -- needs to be seen. Cont valtrex

## 2013-06-03 MED ORDER — HYDROCODONE-ACETAMINOPHEN 5-325 MG PO TABS: 1.0000 | ORAL_TABLET | Freq: Four times a day (QID) | ORAL | Status: DC | PRN

## 2013-06-03 NOTE — Telephone Encounter (Signed)
1. I just printed a prescription for Vicodin which he needs to take as needed. Will make him drowsy, needs to be careful. Do not mix with Tylenol. 2. He takes gabapentin One tablet twice a day, increase to 2 tablets twice a day, send a new prescription 3. RTC 2 weeks, sooner if pain not controlled

## 2013-06-03 NOTE — Telephone Encounter (Signed)
Notified wife. Placed at front desk

## 2013-08-12 ENCOUNTER — Other Ambulatory Visit: Payer: Self-pay | Admitting: Internal Medicine

## 2013-08-16 ENCOUNTER — Telehealth: Payer: Self-pay | Admitting: Internal Medicine

## 2013-08-16 MED ORDER — BENAZEPRIL HCL 40 MG PO TABS: ORAL_TABLET | ORAL | Status: DC

## 2013-08-16 NOTE — Telephone Encounter (Signed)
Rx sent. Patient notified. 

## 2013-08-16 NOTE — Telephone Encounter (Signed)
Caller name:  Daimen Relation to pt:  Call back [place]: [place]  Reason for call:   Pt is needing the refill on benazepril (LOTENSIN) 40 MG tablet.  Pt has been faxing it to Dr. Linna Darner by mistake.    Pt and wife were upset about this.

## 2013-08-18 ENCOUNTER — Telehealth: Payer: Self-pay | Admitting: *Deleted

## 2013-08-18 ENCOUNTER — Other Ambulatory Visit: Payer: Self-pay | Admitting: Internal Medicine

## 2013-08-18 MED ORDER — GABAPENTIN 300 MG PO CAPS: ORAL_CAPSULE | ORAL | Status: DC

## 2013-08-18 NOTE — Telephone Encounter (Signed)
Gabapentin 300 mg  Last OV- 05/31/13 Last refilled- 03/03/13 # 60 / 3 rf  UDS- none

## 2013-08-18 NOTE — Telephone Encounter (Signed)
Ok for #60 

## 2013-08-19 NOTE — Telephone Encounter (Signed)
rx faxed to [facility].

## 2013-08-24 ENCOUNTER — Other Ambulatory Visit: Payer: Self-pay | Admitting: Cardiology

## 2013-10-20 ENCOUNTER — Telehealth: Payer: Self-pay | Admitting: *Deleted

## 2013-10-20 DIAGNOSIS — I712 Thoracic aortic aneurysm, without rupture, unspecified: Secondary | ICD-10-CM

## 2013-10-20 NOTE — Telephone Encounter (Signed)
Spoke with pt, it is time to do a CTA of the chest to follow up on thoracic aneurysm. Patient voiced understanding someone from the office will call back to schedule.

## 2013-10-21 ENCOUNTER — Other Ambulatory Visit: Payer: Self-pay | Admitting: *Deleted

## 2013-10-21 DIAGNOSIS — I712 Thoracic aortic aneurysm, without rupture, unspecified: Secondary | ICD-10-CM

## 2013-10-21 DIAGNOSIS — Z01812 Encounter for preprocedural laboratory examination: Secondary | ICD-10-CM

## 2013-10-25 ENCOUNTER — Other Ambulatory Visit (INDEPENDENT_AMBULATORY_CARE_PROVIDER_SITE_OTHER): Payer: Medicare Other

## 2013-10-25 DIAGNOSIS — I712 Thoracic aortic aneurysm, without rupture, unspecified: Secondary | ICD-10-CM

## 2013-10-25 DIAGNOSIS — Z01812 Encounter for preprocedural laboratory examination: Secondary | ICD-10-CM

## 2013-10-25 LAB — BASIC METABOLIC PANEL
BUN: 25 mg/dL — AB (ref 6–23)
CHLORIDE: 101 meq/L (ref 96–112)
CO2: 28 mEq/L (ref 19–32)
Calcium: 9.8 mg/dL (ref 8.4–10.5)
Creatinine, Ser: 1 mg/dL (ref 0.4–1.5)
GFR: 74.35 mL/min (ref >60.00)
GLUCOSE: 92 mg/dL (ref 70–99)
Potassium: 5.2 mEq/L — ABNORMAL HIGH (ref 3.5–5.1)
Sodium: 136 mEq/L (ref 135–145)

## 2013-10-27 ENCOUNTER — Ambulatory Visit (INDEPENDENT_AMBULATORY_CARE_PROVIDER_SITE_OTHER)
Admission: RE | Admit: 2013-10-27 | Discharge: 2013-10-27 | Disposition: A | Discharge status: Home / Self Care | Payer: Medicare Other | Source: Ambulatory Visit | Attending: Cardiology | Admitting: Cardiology

## 2013-10-27 DIAGNOSIS — I712 Thoracic aortic aneurysm, without rupture, unspecified: Secondary | ICD-10-CM

## 2013-10-27 MED ORDER — IOHEXOL 300 MG/ML  SOLN
100.0000 mL | Freq: Once | INTRAMUSCULAR | Status: AC | PRN
  Administered 2013-10-27: 100 mL via INTRAVENOUS

## 2013-10-28 ENCOUNTER — Other Ambulatory Visit: Payer: Medicare Other

## 2013-10-29 ENCOUNTER — Other Ambulatory Visit: Payer: Self-pay | Admitting: Family Medicine

## 2013-11-01 ENCOUNTER — Encounter: Payer: Self-pay | Admitting: Internal Medicine

## 2013-11-01 ENCOUNTER — Ambulatory Visit (INDEPENDENT_AMBULATORY_CARE_PROVIDER_SITE_OTHER): Payer: Medicare Other | Admitting: Internal Medicine

## 2013-11-01 VITALS — BP 193/72 | HR 65 | Temp 97.9°F | Wt 224.1 lb

## 2013-11-01 DIAGNOSIS — E119 Type 2 diabetes mellitus without complications: Secondary | ICD-10-CM

## 2013-11-01 DIAGNOSIS — Z23 Encounter for immunization: Secondary | ICD-10-CM

## 2013-11-01 DIAGNOSIS — Z0000 Encounter for general adult medical examination without abnormal findings: Secondary | ICD-10-CM

## 2013-11-01 DIAGNOSIS — I10 Essential (primary) hypertension: Secondary | ICD-10-CM

## 2013-11-01 DIAGNOSIS — B029 Zoster without complications: Secondary | ICD-10-CM

## 2013-11-01 DIAGNOSIS — R21 Rash and other nonspecific skin eruption: Secondary | ICD-10-CM

## 2013-11-01 LAB — BASIC METABOLIC PANEL
BUN: 24 mg/dL — ABNORMAL HIGH (ref 6–23)
CALCIUM: 9.8 mg/dL (ref 8.4–10.5)
CO2: 21 meq/L (ref 19–32)
CREATININE: 1.1 mg/dL (ref 0.4–1.5)
Chloride: 103 mEq/L (ref 96–112)
GFR: 70.39 mL/min (ref >60.00)
GLUCOSE: 90 mg/dL (ref 70–99)
Potassium: 4.7 mEq/L (ref 3.5–5.1)
Sodium: 137 mEq/L (ref 135–145)

## 2013-11-01 LAB — HEMOGLOBIN A1C: Hgb A1c MFr Bld: 6.5 % (ref 4.6–6.5)

## 2013-11-01 MED ORDER — KETOCONAZOLE 2 % EX CREA: 1.0000 "application " | TOPICAL_CREAM | Freq: Two times a day (BID) | CUTANEOUS | Status: DC

## 2013-11-01 NOTE — Progress Notes (Signed)
Subjective:    Patient ID: William Faulkner, male    DOB: 31-Mar-1934, 79 y.o.   MRN: [id number]  DOS:  11/01/2013 Type of visit - description : rov Interval history: Diabetes, good medication compliance, ambulatory blood sugars around 102, 100 in the mornings Hypertension, BP today is elevated, ambulatory BPs always in the 126, 134 range. Shingles, denies pain but there is a "spot" at site of previous shingles Rash, at the groin and feet, wonders about a dermatology referral, minimal itching.   ROS Denies chest pain or difficulty breathing No nausea, vomiting, diarrhea or blood in the stools   Past Medical History  Diagnosis Date  . Hyperlipidemia   . HYPERTENSION, ESSENTIAL NOS   . CEREBROVASCULAR DISEASE   . CAD, ARTERY BYPASS GRAFT   . AORTIC INSUFFICIENCY   . AORTIC ANEUR UNSPEC SITE WITHOUT MENTION RUPTURE   . DIAB W/O COMP TYPE II/UNS NOT STATED UNCNTRL   . NEOPLASM, MALIGNANT, BLADDER, HX OF   . BPH (benign prostatic hyperplasia)     Past Surgical History  Procedure Laterality Date  . Rotator cuff repair  2009    right; Dr Shellia Carwin  . Bladder tumor excision      Surgery x6, Dr.Tannebaum  . Colonoscopy  2002/2004    Negative  . Lumbar epidural injection  2008  . Colonoscopy w/ polypectomy      Dr Ree [facility]ay [facility]  . Blepharoplasty  1980    bilaterally, Dr Gershon Crane  . Coronary artery bypass graft  2011    x 5  . Back surgery  2003    lower  . Inguinal hernia repair  10/24/2011    Procedure: HERNIA REPAIR INGUINAL ADULT;  Surgeon: Earnstine Regal, MD;  Location: [facility];  Service: General;  Laterality: Right;  Repair Right Inguinal Hernia with Mesh, Umbilical Hernia Repair with Mesh  . Umbilical hernia repair  10/24/2011    Procedure: HERNIA REPAIR UMBILICAL ADULT;  Surgeon: Earnstine Regal, MD;  Location: [facility];  Service: General;  Laterality: N/A;  . Cystoscopy  09/02/2012    History   Social History  . Marital Status: Married    Spouse Name: N/A   Number of Children: 3  . Years of Education: N/A   Occupational History  . retired    Social History Main Topics  . Smoking status: Former Smoker -- 0.30 packs/day for 15 years    Types: Cigarettes    Quit date: 01/22/1984  . Smokeless tobacco: Never Used  . Alcohol Use: No     Comment: rare, wine   . Drug Use: No  . Sexual Activity: Not on file   Other Topics Concern  . Not on file   Social History Narrative   8 G-children   Married x 55 years        Medication List       This list is accurate as of: 11/01/13  5:22 PM.  Always use your most recent med list.               aspirin 81 MG tablet  Take 81 mg by mouth daily.     benazepril 40 MG tablet  Commonly known as:  LOTENSIN  TAKE ONE TABLET BY MOUTH ONE TIME DAILY     CRESTOR 20 MG tablet  Generic drug:  rosuvastatin  TAKE 1 TABLET DAILY     gabapentin 300 MG capsule  Commonly known as:  NEURONTIN  Take 1 capsule twice daily.  ketoconazole 2 % cream  Commonly known as:  NIZORAL  Apply 1 application topically 2 (two) times daily.     metFORMIN 850 MG tablet  Commonly known as:  GLUCOPHAGE  take one tablet by mouth twice daily with largest meals.     propranolol ER 80 MG 24 hr capsule  Commonly known as:  INDERAL LA  TAKE ONE CAPSULE BY MOUTH ONE TIME DAILY     timolol 0.5 % ophthalmic solution  Commonly known as:  TIMOPTIC  Place 1 drop into both eyes every morning. As directed           Objective:   Physical Exam  Skin:      BP 193/72  Pulse 65  Temp(Src) 97.9 F (36.6 C) (Oral)  Wt 224 lb 2 oz (101.662 kg)  SpO2 99% General -- alert, well-developed, NAD.   Lungs -- normal respiratory effort, no intercostal retractions, no accessory muscle use, and normal breath sounds.  Heart-- normal rate, regular rhythm, no murmur.   skin-- Hyperpigmentation at the site of previous shingles on the left upper back Extremities-- no pretibial edema bilaterally  Neurologic--  alert & oriented  X3. Speech normal, gait appropriate for age, strength symmetric and appropriate for age.   Psych-- Cognition and judgment appear intact. Cooperative with normal attention span and concentration. No anxious or depressed appearing.     Assessment & Plan:     \

## 2013-11-01 NOTE — Assessment & Plan Note (Signed)
Elevated today but normal ambulatory BPs and normal BPs per chart review. Plan: BMP, no change

## 2013-11-01 NOTE — Patient Instructions (Signed)
Get your blood work before you leave    Please come back to the office in 3 months  for a physical exam. Come back fasting

## 2013-11-01 NOTE — Assessment & Plan Note (Signed)
Td today High dose vs regular flu shot discussed, elected regular RTC for a CPX

## 2013-11-01 NOTE — Assessment & Plan Note (Signed)
Inguinal and feet rash likely fungal. Recommend consistent use of Nizoral cream twice a day for 2 weeks, if not better we'll have to consider oral medications

## 2013-11-01 NOTE — Progress Notes (Signed)
Pre visit review using our clinic review tool, if applicable. No additional management support is needed unless otherwise documented below in the visit note. 

## 2013-11-01 NOTE — Assessment & Plan Note (Addendum)
Resolved, has some postinflammatory hyperpigmentation, recommend observation

## 2013-11-01 NOTE — Assessment & Plan Note (Signed)
Good compliance with metformin, check the A1c

## 2013-11-12 ENCOUNTER — Other Ambulatory Visit: Payer: Self-pay

## 2013-11-12 MED ORDER — BENAZEPRIL HCL 40 MG PO TABS: ORAL_TABLET | ORAL | Status: DC

## 2013-11-17 ENCOUNTER — Other Ambulatory Visit: Payer: Self-pay | Admitting: *Deleted

## 2013-11-17 ENCOUNTER — Ambulatory Visit (HOSPITAL_COMMUNITY)
Admission: RE | Admit: 2013-11-17 | Discharge: 2013-11-17 | Disposition: A | Discharge status: Home / Self Care | Payer: Medicare Other | Source: Ambulatory Visit | Attending: Cardiovascular Disease | Admitting: Cardiovascular Disease

## 2013-11-17 DIAGNOSIS — E785 Hyperlipidemia, unspecified: Secondary | ICD-10-CM | POA: Insufficient documentation

## 2013-11-17 DIAGNOSIS — I672 Cerebral atherosclerosis: Secondary | ICD-10-CM | POA: Insufficient documentation

## 2013-11-17 DIAGNOSIS — I10 Essential (primary) hypertension: Secondary | ICD-10-CM | POA: Insufficient documentation

## 2013-11-17 DIAGNOSIS — I679 Cerebrovascular disease, unspecified: Secondary | ICD-10-CM

## 2013-11-17 DIAGNOSIS — E119 Type 2 diabetes mellitus without complications: Secondary | ICD-10-CM | POA: Insufficient documentation

## 2013-11-17 NOTE — Progress Notes (Signed)
Carotid Duplex Completed. These results remain stable when compared to prior study. Oda Cogan, BS, RDMS, RVT

## 2013-11-18 NOTE — Progress Notes (Signed)
HPI: FU CAD, aortic insufficiency, and cerebrovascular disease. A cardiac catheterization in May of 2011 revealed severe three-vessel coronary artery disease and normal LV function. He subsequently underwent coronary artery bypass and graft in May of 2011 (left internal mammary artery to left anterior descending artery, saphenous vein graft to first diagonal, saphenous vein graft obtuse marginal 1, sequential saphenous vein graft to distal right coronary and posterior descending artery). Note an intraoperative transesophageal echocardiogram showed mild to moderate aortic insufficiency and a dilated aortic root. However this was felt not to require surgical intervention at that time. Last echocardiogram in August 2014 showed normal LV function, mild to moderate aortic insufficiency, dilated aorta with ascending measuring 47 mm. Mild biatrial enlargement. He also had carotid Dopplers last performed in Sept 2014. He had a 40-59% bilateral stenosis; chronically occluded right vertebral. F/U recommended in 1 year. Last chest CT in October 2015 showed stable ascending thoracic aortic aneurysm measuring 4.6 x 4.4 cm. Since I last saw him, He denies dyspnea, chest pain, palpitations or syncope.   Current Outpatient Prescriptions  Medication Sig Dispense Refill  . benazepril (LOTENSIN) 40 MG tablet TAKE ONE TABLET BY MOUTH ONE TIME DAILY 90 tablet 0  . CRESTOR 20 MG tablet TAKE 1 TABLET DAILY 90 tablet 0  . gabapentin (NEURONTIN) 300 MG capsule Take 1 capsule twice daily. 60 capsule 2  . ketoconazole (NIZORAL) 2 % cream Apply 1 application topically 2 (two) times daily. 60 g 3  . metFORMIN (GLUCOPHAGE) 850 MG tablet take one tablet by mouth twice daily with largest meals. 180 tablet 1  . propranolol ER (INDERAL LA) 80 MG 24 hr capsule TAKE ONE CAPSULE BY MOUTH ONE TIME DAILY 30 capsule 6  . timolol (TIMOPTIC) 0.5 % ophthalmic solution Place 1 drop into both eyes every morning. As directed    . aspirin  81 MG tablet Take 81 mg by mouth daily.       No current facility-administered medications for this visit.     Past Medical History  Diagnosis Date  . Hyperlipidemia   . HYPERTENSION, ESSENTIAL NOS   . CEREBROVASCULAR DISEASE   . CAD, ARTERY BYPASS GRAFT   . AORTIC INSUFFICIENCY   . AORTIC ANEUR UNSPEC SITE WITHOUT MENTION RUPTURE   . DIAB W/O COMP TYPE II/UNS NOT STATED UNCNTRL   . NEOPLASM, MALIGNANT, BLADDER, HX OF   . BPH (benign prostatic hyperplasia)     Past Surgical History  Procedure Laterality Date  . Rotator cuff repair  2009    right; Dr Shellia Carwin  . Bladder tumor excision      Surgery x6, Dr.Tannebaum  . Colonoscopy  2002/2004    Negative  . Lumbar epidural injection  2008  . Colonoscopy w/ polypectomy      Dr [facility]  . Blepharoplasty  1980    bilaterally, Dr Gershon Crane  . Coronary artery bypass graft  2011    x 5  . Back surgery  2003    lower  . Inguinal hernia repair  10/24/2011    Procedure: HERNIA REPAIR INGUINAL ADULT;  Surgeon: Earnstine Regal, MD;  Location: [facility];  Service: General;  Laterality: Right;  Repair Right Inguinal Hernia with Mesh, Umbilical Hernia Repair with Mesh  . Umbilical hernia repair  10/24/2011    Procedure: HERNIA REPAIR UMBILICAL ADULT;  Surgeon: Earnstine Regal, MD;  Location: [facility];  Service: General;  Laterality: N/A;  . Cystoscopy  09/02/2012    History  Social History  . Marital Status: Married    Spouse Name: N/A    Number of Children: 3  . Years of Education: N/A   Occupational History  . retired    Social History Main Topics  . Smoking status: Former Smoker -- 0.30 packs/day for 15 years    Types: Cigarettes    Quit date: 01/22/1984  . Smokeless tobacco: Never Used  . Alcohol Use: No     Comment: rare, wine   . Drug Use: No  . Sexual Activity: Not on file   Other Topics Concern  . Not on file   Social History Narrative   8 G-children   Married x 55 years    ROS: no fevers or chills,  productive cough, hemoptysis, dysphasia, odynophagia, melena, hematochezia, dysuria, hematuria, rash, seizure activity, orthopnea, PND, pedal edema, claudication. Remaining systems are negative.  Physical Exam: Well-developed well-nourished in no acute distress.  Skin is warm and dry.  HEENT is normal.  Neck is supple.  Chest is clear to auscultation with normal expansion.  Cardiovascular exam is regular rate and rhythm. 2/6 diastolic murmur left sternal border. Abdominal exam nontender or distended. No masses palpated. Extremities show no edema. neuro grossly intact  ECG Sinus rhythm at a rate of 56. First-degree AV block. Right bundle branch block.

## 2013-11-22 ENCOUNTER — Encounter: Payer: Self-pay | Admitting: Cardiology

## 2013-11-22 ENCOUNTER — Ambulatory Visit (INDEPENDENT_AMBULATORY_CARE_PROVIDER_SITE_OTHER): Payer: Medicare Other | Admitting: Cardiology

## 2013-11-22 VITALS — BP 200/70 | HR 56 | Ht 74.0 in | Wt 226.1 lb

## 2013-11-22 DIAGNOSIS — I719 Aortic aneurysm of unspecified site, without rupture: Secondary | ICD-10-CM

## 2013-11-22 DIAGNOSIS — I10 Essential (primary) hypertension: Secondary | ICD-10-CM

## 2013-11-22 MED ORDER — HYDROCHLOROTHIAZIDE 12.5 MG PO CAPS: 12.5000 mg | ORAL_CAPSULE | Freq: Every day | ORAL | Status: DC

## 2013-11-22 NOTE — Assessment & Plan Note (Addendum)
Plan repeat CTA of thoracic aorta 10/15 to follow-up aneurysm.

## 2013-11-22 NOTE — Assessment & Plan Note (Signed)
Blood pressure elevated. Add HCTZ 12.5 mg daily. Check potassium and renal function in 1 week. Follow blood pressure at home and increase medications as needed. He states it typically runs in the 140 range.

## 2013-11-22 NOTE — Assessment & Plan Note (Signed)
Continue aspirin and statin. 

## 2013-11-22 NOTE — Assessment & Plan Note (Signed)
Patient will need follow-up echoes in the future. 

## 2013-11-22 NOTE — Assessment & Plan Note (Signed)
Continue aspirin and statin. Await follow-up carotid Dopplers.

## 2013-11-22 NOTE — Patient Instructions (Signed)
Your physician wants you to follow-up in: [place] will receive a reminder letter in the mail two months in advance. If you don't receive a letter, please call our office to schedule the follow-up appointment.   START HCTZ 12.5 MG ONCE DAILY  Your physician recommends that you return for lab work in: [place]

## 2013-12-05 ENCOUNTER — Other Ambulatory Visit: Payer: Self-pay | Admitting: Cardiology

## 2013-12-08 LAB — BASIC METABOLIC PANEL WITH GFR
BUN: 23 mg/dL (ref 6–23)
CALCIUM: 9.6 mg/dL (ref 8.4–10.5)
CO2: 27 mEq/L (ref 19–32)
CREATININE: 1.11 mg/dL (ref 0.50–1.35)
Chloride: 103 mEq/L (ref 96–112)
GFR, EST AFRICAN AMERICAN: 74 mL/min
GFR, EST NON AFRICAN AMERICAN: 64 mL/min
Glucose, Bld: 92 mg/dL (ref 70–99)
Potassium: 5.2 mEq/L (ref 3.5–5.3)
Sodium: 139 mEq/L (ref 135–145)

## 2014-01-18 ENCOUNTER — Ambulatory Visit (INDEPENDENT_AMBULATORY_CARE_PROVIDER_SITE_OTHER): Payer: Medicare Other | Admitting: Internal Medicine

## 2014-01-18 ENCOUNTER — Encounter: Payer: Self-pay | Admitting: Internal Medicine

## 2014-01-18 VITALS — BP 144/76 | HR 59 | Temp 97.6°F | Ht 74.0 in | Wt 228.0 lb

## 2014-01-18 DIAGNOSIS — I10 Essential (primary) hypertension: Secondary | ICD-10-CM

## 2014-01-18 DIAGNOSIS — E785 Hyperlipidemia, unspecified: Secondary | ICD-10-CM

## 2014-01-18 DIAGNOSIS — Z79899 Other long term (current) drug therapy: Secondary | ICD-10-CM

## 2014-01-18 DIAGNOSIS — G629 Polyneuropathy, unspecified: Secondary | ICD-10-CM

## 2014-01-18 DIAGNOSIS — E119 Type 2 diabetes mellitus without complications: Secondary | ICD-10-CM

## 2014-01-18 LAB — FOLATE: Folate: 23.1 ng/mL (ref >5.9)

## 2014-01-18 LAB — TSH: TSH: 2.9 u[IU]/mL (ref 0.35–4.50)

## 2014-01-18 LAB — VITAMIN D 25 HYDROXY (VIT D DEFICIENCY, FRACTURES): VITD: 25.64 ng/mL — AB (ref 30.00–100.00)

## 2014-01-18 LAB — VITAMIN B12: Vitamin B-12: 254 pg/mL (ref 211–911)

## 2014-01-18 MED ORDER — GABAPENTIN 600 MG PO TABS
600.0000 mg | ORAL_TABLET | Freq: Two times a day (BID) | ORAL | Status: DC
Start: 2014-01-18 — End: 2014-03-28

## 2014-01-18 NOTE — Progress Notes (Signed)
Pre visit review using our clinic review tool, if applicable. No additional management support is needed unless otherwise documented below in the visit note. 

## 2014-01-18 NOTE — Progress Notes (Signed)
Subjective:    Patient ID: William Faulkner, male    DOB: September 20, 1934, 79 y.o.   MRN: [id number]  DOS:  01/18/2014 Type of visit - description : acute, multiple concerns Interval history: Gradually increasing in discomfort for from the calf down bilaterally, sx are  day or night, sometimes he feels is worse with walking, sometimes worse at night. He continue with neck and shoulder pain, chronic issue, not getting better worse, see assessment and plan.  ROS Denies fever chills. No headache or weight loss No chest pain or difficulty breathing No nausea, vomiting, diarrhea No myalgias per se.   Past Medical History  Diagnosis Date  . Hyperlipidemia   . HYPERTENSION, ESSENTIAL NOS   . CEREBROVASCULAR DISEASE   . CAD, ARTERY BYPASS GRAFT   . AORTIC INSUFFICIENCY   . AORTIC ANEUR UNSPEC SITE WITHOUT MENTION RUPTURE   . DIAB W/O COMP TYPE II/UNS NOT STATED UNCNTRL   . NEOPLASM, MALIGNANT, BLADDER, HX OF   . BPH (benign prostatic hyperplasia)     Past Surgical History  Procedure Laterality Date  . Rotator cuff repair  2009    right; Dr Shellia Carwin  . Bladder tumor excision      Surgery x6, Dr.Tannebaum  . Colonoscopy  2002/2004    Negative  . Lumbar epidural injection  2008  . Colonoscopy w/ polypectomy      Dr [facility]  . Blepharoplasty  1980    bilaterally, Dr Gershon Crane  . Coronary artery bypass graft  2011    x 5  . Back surgery  2003    lower  . Inguinal hernia repair  10/24/2011    Procedure: HERNIA REPAIR INGUINAL ADULT;  Surgeon: Earnstine Regal, MD;  Location: [facility];  Service: General;  Laterality: Right;  Repair Right Inguinal Hernia with Mesh, Umbilical Hernia Repair with Mesh  . Umbilical hernia repair  10/24/2011    Procedure: HERNIA REPAIR UMBILICAL ADULT;  Surgeon: Earnstine Regal, MD;  Location: [facility];  Service: General;  Laterality: N/A;  . Cystoscopy  09/02/2012    History   Social History  . Marital Status: Married    Spouse Name: N/A    Number  of Children: 3  . Years of Education: N/A   Occupational History  . retired    Social History Main Topics  . Smoking status: Former Smoker -- 0.30 packs/day for 15 years    Types: Cigarettes    Quit date: 01/22/1984  . Smokeless tobacco: Never Used  . Alcohol Use: No     Comment: rare, wine   . Drug Use: No  . Sexual Activity: Not on file   Other Topics Concern  . Not on file   Social History Narrative   8 G-children   Married x 55 years        Medication List       This list is accurate as of: 01/18/14  8:58 PM.  Always use your most recent med list.               amoxicillin 500 MG tablet  Commonly known as:  AMOXIL  Take 500 mg by mouth 2 (two) times daily. Before dental work     aspirin 81 MG tablet  Take 81 mg by mouth daily.     benazepril 40 MG tablet  Commonly known as:  LOTENSIN  TAKE ONE TABLET BY MOUTH ONE TIME DAILY     gabapentin 600 MG tablet  Commonly known as:  NEURONTIN  Take 1 tablet (600 mg total) by mouth 2 (two) times daily.     hydrochlorothiazide 12.5 MG capsule  Commonly known as:  MICROZIDE  Take 1 capsule (12.5 mg total) by mouth daily.     ketoconazole 2 % cream  Commonly known as:  NIZORAL  Apply 1 application topically 2 (two) times daily.     metFORMIN 850 MG tablet  Commonly known as:  GLUCOPHAGE  take one tablet by mouth twice daily with largest meals.     propranolol ER 80 MG 24 hr capsule  Commonly known as:  INDERAL LA  TAKE ONE CAPSULE BY MOUTH ONE TIME DAILY     rosuvastatin 20 MG tablet  Commonly known as:  CRESTOR  Take 1 tablet (20 mg total) by mouth daily.     timolol 0.5 % ophthalmic solution  Commonly known as:  TIMOPTIC  Place 1 drop into both eyes every morning. As directed           Objective:   Physical Exam BP 144/76 mmHg  Pulse 59  Temp(Src) 97.6 F (36.4 C) (Oral)  Ht 6\' 2"  (1.88 m)  Wt 228 lb (103.42 kg)  BMI 29.26 kg/m2  SpO2 97%  General -- alert, well-developed, NAD.    ctions, no accessory muscle use, and normal breath sounds.  Heart-- normal rate, regular rhythm, no murmur.  Abdomen-- Not distended, good bowel sounds,soft, non-tender. No bruit  Extremities--  Normal femoral pulses bilaterally, slightly decreased pedal pulses symmetrically, + cap refills. He has a mild erythematous, slightly scaly rash, in a moccasin distribution worse on the left foot. Neurologic--  alert & oriented X3. Speech normal, gait appropriate for age, strength symmetric and appropriate for age.   Psych-- Cognition and judgment appear intact. Cooperative with normal attention span and concentration. No anxious or depressed appearing.       Assessment & Plan:   Neck pain, status post eval by Dr. Nelva Bush 11-2012, subsequently went to see Dr. Drema Dallas around September 2015 for the same and shoulder pain, he had MRI. The pain is about the same, recommend to discuss with ortho again

## 2014-01-18 NOTE — Patient Instructions (Signed)
Get your blood work before you leave. Increase gabapentin to 600 mg one tablet twice a day Next visit 2 months

## 2014-01-18 NOTE — Assessment & Plan Note (Signed)
Recently cardiology added a diuretic, ambulatory BPs normal

## 2014-01-18 NOTE — Assessment & Plan Note (Addendum)
CC today is lower extremity pain as described in the history of present illness, likely neuropathy. Other considerations include peripheral vascular disease or pain related to lower back problem . Plan: Labs, see orders Increase gabapentin dose (was a started on gabapentin 02/2013 for neuropathy) ABIs Reassess in 2 months He also has a rash, patient concerned about it, I don't think that has to do with his other symptoms, is likely a fungal infection, recommend Nizoral cream daily to that area

## 2014-01-19 MED ORDER — VITAMIN D (ERGOCALCIFEROL) 1.25 MG (50000 UNIT) PO CAPS: 50000.0000 [IU] | ORAL_CAPSULE | ORAL | Status: DC

## 2014-01-19 NOTE — Addendum Note (Signed)
Addended by: Janalee Dane C on: 01/19/2014 11:52 AM   Modules accepted: Orders, Medications

## 2014-01-20 ENCOUNTER — Ambulatory Visit (HOSPITAL_COMMUNITY)
Admission: RE | Admit: 2014-01-20 | Discharge: 2014-01-20 | Disposition: A | Discharge status: Home / Self Care | Payer: Medicare Other | Source: Ambulatory Visit | Attending: Internal Medicine | Admitting: Internal Medicine

## 2014-01-20 DIAGNOSIS — G629 Polyneuropathy, unspecified: Secondary | ICD-10-CM

## 2014-01-20 NOTE — Progress Notes (Signed)
VASCULAR LAB PRELIMINARY  ARTERIAL  ABI completed:    RIGHT    LEFT    PRESSURE WAVEFORM  PRESSURE WAVEFORM  BRACHIAL 150 Triphasic BRACHIAL 135 Triphasic  DP 156 Triphasic DP 166 Triphasic  AT   AT    PT 202 Triphasic PT 174 Triphasic  PER   PER    GREAT TOE  NA GREAT TOE  NA    RIGHT LEFT  ABI 1.35 1.16    The right ABI is elevated, suggestive of calcified vessels. The left ABI is within normal limits.  01/20/2014 1:18 PM Maudry Mayhew, RVT, RDCS, RDMS

## 2014-01-25 ENCOUNTER — Other Ambulatory Visit: Payer: Self-pay

## 2014-01-25 MED ORDER — VITAMIN D (ERGOCALCIFEROL) 1.25 MG (50000 UNIT) PO CAPS: 50000.0000 [IU] | ORAL_CAPSULE | ORAL | Status: DC

## 2014-02-03 ENCOUNTER — Telehealth: Payer: Self-pay | Admitting: *Deleted

## 2014-02-03 NOTE — Telephone Encounter (Signed)
Ok for surgery William Faulkner  

## 2014-02-03 NOTE — Telephone Encounter (Signed)
Patient is needing clearance for shoulder surgery with dr Earlie Counts. Will forward for dr Stanford Breed review

## 2014-02-03 NOTE — Telephone Encounter (Signed)
Spoke with pt, Aware of dr crenshaw's recommendations.  °

## 2014-02-04 ENCOUNTER — Other Ambulatory Visit: Payer: Self-pay | Admitting: Orthopedic Surgery

## 2014-02-04 DIAGNOSIS — R222 Localized swelling, mass and lump, trunk: Secondary | ICD-10-CM

## 2014-02-04 NOTE — Telephone Encounter (Signed)
Clearance hand delivered to [facility] orthopaedics

## 2014-02-07 ENCOUNTER — Ambulatory Visit
Admission: RE | Admit: 2014-02-07 | Discharge: 2014-02-07 | Disposition: A | Discharge status: Home / Self Care | Payer: Medicare Other | Source: Ambulatory Visit | Attending: Orthopedic Surgery | Admitting: Orthopedic Surgery

## 2014-02-07 DIAGNOSIS — R222 Localized swelling, mass and lump, trunk: Secondary | ICD-10-CM

## 2014-02-07 MED ORDER — IOHEXOL 300 MG/ML  SOLN
75.0000 mL | Freq: Once | INTRAMUSCULAR | Status: AC | PRN
  Administered 2014-02-07: 75 mL via INTRAVENOUS

## 2014-02-11 ENCOUNTER — Other Ambulatory Visit: Payer: Self-pay | Admitting: Internal Medicine

## 2014-02-14 HISTORY — PX: SHOULDER ARTHROSCOPY WITH ROTATOR CUFF REPAIR AND SUBACROMIAL DECOMPRESSION: SHX5686

## 2014-02-22 ENCOUNTER — Other Ambulatory Visit: Payer: Self-pay

## 2014-02-22 MED ORDER — METFORMIN HCL 850 MG PO TABS: ORAL_TABLET | ORAL | Status: DC

## 2014-03-23 ENCOUNTER — Ambulatory Visit (INDEPENDENT_AMBULATORY_CARE_PROVIDER_SITE_OTHER): Payer: Medicare Other | Admitting: Internal Medicine

## 2014-03-23 ENCOUNTER — Encounter: Payer: Self-pay | Admitting: Internal Medicine

## 2014-03-23 VITALS — BP 131/70 | HR 60 | Temp 98.1°F | Ht 74.0 in | Wt 225.2 lb

## 2014-03-23 DIAGNOSIS — Z23 Encounter for immunization: Secondary | ICD-10-CM

## 2014-03-23 DIAGNOSIS — K8020 Calculus of gallbladder without cholecystitis without obstruction: Secondary | ICD-10-CM

## 2014-03-23 DIAGNOSIS — E119 Type 2 diabetes mellitus without complications: Secondary | ICD-10-CM

## 2014-03-23 DIAGNOSIS — E041 Nontoxic single thyroid nodule: Secondary | ICD-10-CM

## 2014-03-23 DIAGNOSIS — I10 Essential (primary) hypertension: Secondary | ICD-10-CM

## 2014-03-23 DIAGNOSIS — I719 Aortic aneurysm of unspecified site, without rupture: Secondary | ICD-10-CM

## 2014-03-23 DIAGNOSIS — E785 Hyperlipidemia, unspecified: Secondary | ICD-10-CM

## 2014-03-23 NOTE — Progress Notes (Signed)
Pre visit review using our clinic review tool, if applicable. No additional management support is needed unless otherwise documented below in the visit note. 

## 2014-03-23 NOTE — Progress Notes (Signed)
Subjective:    Patient ID: William Faulkner, male    DOB: [birth year], 80 y.o.   MRN: [id number]  DOS:  03/23/2014 Type of visit - description : f/u , here w/ his wife Interval history: Neuropathy, started gabapentin, no s/e, sx decreased Rash- moccasin, improved Feels a lump at the L chest, had a CT rx by ortho  , likes to dicsuss results w/ me, report reviewed  Had shoulder surgery, doing better   Review of Systems No CP-SOB No nausea, vomiting, diarrhea  Past Medical History  Diagnosis Date  . Hyperlipidemia   . HYPERTENSION, ESSENTIAL NOS   . CEREBROVASCULAR DISEASE   . CAD, ARTERY BYPASS GRAFT   . AORTIC INSUFFICIENCY   . AORTIC ANEUR UNSPEC SITE WITHOUT MENTION RUPTURE   . DIAB W/O COMP TYPE II/UNS NOT STATED UNCNTRL   . NEOPLASM, MALIGNANT, BLADDER, HX OF   . BPH (benign prostatic hyperplasia)     Past Surgical History  Procedure Laterality Date  . Rotator cuff repair  2009    right; Dr Shellia Carwin  . Bladder tumor excision      Surgery x6, Dr.Tannebaum  . Colonoscopy  2002/2004    Negative  . Lumbar epidural injection  2008  . Colonoscopy w/ polypectomy      Dr [facility]  . Blepharoplasty  1980    bilaterally, Dr Gershon Crane  . Coronary artery bypass graft  2011    x 5  . Back surgery  2003    lower  . Inguinal hernia repair  10/24/2011    Procedure: HERNIA REPAIR INGUINAL ADULT;  Surgeon: Earnstine Regal, MD;  Location: [facility];  Service: General;  Laterality: Right;  Repair Right Inguinal Hernia with Mesh, Umbilical Hernia Repair with Mesh  . Umbilical hernia repair  10/24/2011    Procedure: HERNIA REPAIR UMBILICAL ADULT;  Surgeon: Earnstine Regal, MD;  Location: [facility];  Service: General;  Laterality: N/A;  . Cystoscopy  09/02/2012    History   Social History  . Marital Status: Married    Spouse Name: N/A  . Number of Children: 3  . Years of Education: N/A   Occupational History  . retired    Social History Main Topics  . Smoking status: Former  Smoker -- 0.30 packs/day for 15 years    Types: Cigarettes    Quit date: 01/22/1984  . Smokeless tobacco: Never Used  . Alcohol Use: No     Comment: rare, wine   . Drug Use: No  . Sexual Activity: Not on file   Other Topics Concern  . Not on file   Social History Narrative   8 G-children   Married x 55 years        Medication List       This list is accurate as of: 03/23/14 11:59 PM.  Always use your most recent med list.               amoxicillin 500 MG tablet  Commonly known as:  AMOXIL  Take 500 mg by mouth 2 (two) times daily. Before dental work     aspirin 81 MG tablet  Take 81 mg by mouth daily.     benazepril 40 MG tablet  Commonly known as:  LOTENSIN  TAKE ONE TABLET BY MOUTH ONE TIME DAILY     gabapentin 600 MG tablet  Commonly known as:  NEURONTIN  Take 1 tablet (600 mg total) by mouth 2 (two) times daily.  hydrochlorothiazide 12.5 MG capsule  Commonly known as:  MICROZIDE  Take 1 capsule (12.5 mg total) by mouth daily.     ketoconazole 2 % cream  Commonly known as:  NIZORAL  Apply 1 application topically 2 (two) times daily.     metFORMIN 850 MG tablet  Commonly known as:  GLUCOPHAGE  take one tablet by mouth twice daily with largest meals.     propranolol ER 80 MG 24 hr capsule  Commonly known as:  INDERAL LA  TAKE ONE CAPSULE BY MOUTH ONE TIME DAILY     rosuvastatin 20 MG tablet  Commonly known as:  CRESTOR  Take 1 tablet (20 mg total) by mouth daily.     timolol 0.5 % ophthalmic solution  Commonly known as:  TIMOPTIC  Place 1 drop into both eyes every morning. As directed     Vitamin D (Ergocalciferol) 50000 UNITS Caps capsule  Commonly known as:  DRISDOL  Take 1 capsule (50,000 Units total) by mouth every 7 (seven) days.           Objective:   Physical Exam BP 131/70 mmHg  Pulse 60  Temp(Src) 98.1 F (36.7 C) (Oral)  Ht 6\' 2"  (1.88 m)  Wt 225 lb 4 oz (102.173 kg)  BMI 28.91 kg/m2  SpO2 98%  General:   Well  developed, well nourished . NAD.  HEENT:  Normocephalic . Face symmetric, atraumatic Lungs:  CTA B Normal respiratory effort, no intercostal retractions, no accessory muscle use. Chest wall, unable to feel a lump or mass in the area of concern at the left upper chest. Heart: RRR,  no murmur.  Muscle skeletal: no pretibial edema bilaterally  Rash, feet: Mild erythema in a moccasin distribution. No scaliness. Skin: Not pale. Not jaundice Neurologic:  alert & oriented X3.  Speech normal, gait appropriate for age and unassisted Psych--  Cognition and judgment appear intact.  Cooperative with normal attention span and concentration.  Behavior appropriate. No anxious or depressed appearing.       Assessment & Plan:      Chest mass, CT was negative, patient reassured, the CT did show a couple of abnormalities.  Due for a physical exam, recommend to come back in 3 months, Prevnar provided today  Today , I spent more than  26   min with the patient: >50% of the time counseling regards results of the CT, multiple questions about the cholelithiasis, aortic aneurysm answered to the best of my abilities. Also questions about the "chest lump" answered.

## 2014-03-23 NOTE — Patient Instructions (Signed)
  Please schedule labs to be done within few days (fasting)  Please go to the front and schedule a visit  In 3 months  for a physical exam

## 2014-03-24 DIAGNOSIS — K8020 Calculus of gallbladder without cholecystitis without obstruction: Secondary | ICD-10-CM | POA: Insufficient documentation

## 2014-03-24 DIAGNOSIS — E041 Nontoxic single thyroid nodule: Secondary | ICD-10-CM | POA: Insufficient documentation

## 2014-03-24 NOTE — Assessment & Plan Note (Signed)
Check FLP, AST, ALT

## 2014-03-24 NOTE — Assessment & Plan Note (Signed)
Cholelithiasis by recent CT, this is an incidental finding, recommend to call or go to the ER if he ever develops symptoms consistent with gallbladder disease. Patient agrees

## 2014-03-24 NOTE — Assessment & Plan Note (Signed)
Thoracic aortic aneurysm, slightly increased by recent CT reading, will notify cardiology, message sent .

## 2014-03-24 NOTE — Assessment & Plan Note (Signed)
Thyroid nodule per recent CT, incidental finding, will check a ultrasound

## 2014-03-24 NOTE — Assessment & Plan Note (Signed)
Continue same medications, check a BMP

## 2014-03-24 NOTE — Assessment & Plan Note (Signed)
Since the last visit ABIs were negative, he is tolerating gabapentin well and symptoms have decreased. Also has a rash likely fungal , we talk about possibly medication by mouth but he's not interested. We agreed to continue using topical antifungals as needed. Nails are dystrophic, some are dark, recommend observation, if he ever see the darkness goin into the skin he will let me know

## 2014-03-28 ENCOUNTER — Other Ambulatory Visit: Payer: Self-pay

## 2014-03-28 ENCOUNTER — Telehealth: Payer: Self-pay | Admitting: Internal Medicine

## 2014-03-28 MED ORDER — GABAPENTIN 600 MG PO TABS: 600.0000 mg | ORAL_TABLET | Freq: Two times a day (BID) | ORAL | Status: DC

## 2014-03-28 MED ORDER — PROPRANOLOL HCL ER 80 MG PO CP24: ORAL_CAPSULE | ORAL | Status: DC

## 2014-03-28 NOTE — Telephone Encounter (Signed)
Spoke with William Faulkner, informed her that Gabapentin was sent to [facility] for  # 90 and 2 refills. Informed her to let us know if they do not receive supply. Flora verbalized understanding.

## 2014-03-28 NOTE — Telephone Encounter (Signed)
My mistake, I apologize, send 90 day supply and 2 refills to [facility]

## 2014-03-28 NOTE — Telephone Encounter (Signed)
Caller name:Flora Relationship to patient:spouse Can be reached:[phone] Pharmacy:  Reason for call:Request call back regarding meds

## 2014-03-28 NOTE — Telephone Encounter (Signed)
Spoke with Dianah Field, Pts wife, she is requesting Gabapentin be increased to a 90 day supply and sent to [facility], informed her that Dr. Larose Kells had sent Gabapentin to [facility] for # 60 and 6 refills in December 2015. Pt's wife states they have not received any refills for Gabapentin and is requesting to increase to # 90 day supply. Informed her I would have to get authorization from Dr. Larose Kells and would call her back. Flora verbalized understanding.

## 2014-03-29 ENCOUNTER — Ambulatory Visit (HOSPITAL_BASED_OUTPATIENT_CLINIC_OR_DEPARTMENT_OTHER): Payer: Medicare Other

## 2014-03-30 ENCOUNTER — Ambulatory Visit (HOSPITAL_BASED_OUTPATIENT_CLINIC_OR_DEPARTMENT_OTHER)
Admission: RE | Admit: 2014-03-30 | Discharge: 2014-03-30 | Disposition: A | Discharge status: Home / Self Care | Payer: Medicare Other | Source: Ambulatory Visit | Attending: Internal Medicine | Admitting: Internal Medicine

## 2014-03-30 ENCOUNTER — Other Ambulatory Visit (INDEPENDENT_AMBULATORY_CARE_PROVIDER_SITE_OTHER): Payer: Medicare Other

## 2014-03-30 DIAGNOSIS — E042 Nontoxic multinodular goiter: Secondary | ICD-10-CM | POA: Insufficient documentation

## 2014-03-30 DIAGNOSIS — E785 Hyperlipidemia, unspecified: Secondary | ICD-10-CM

## 2014-03-30 DIAGNOSIS — E041 Nontoxic single thyroid nodule: Secondary | ICD-10-CM | POA: Diagnosis present

## 2014-03-30 DIAGNOSIS — E049 Nontoxic goiter, unspecified: Secondary | ICD-10-CM | POA: Diagnosis not present

## 2014-03-30 DIAGNOSIS — I10 Essential (primary) hypertension: Secondary | ICD-10-CM

## 2014-03-30 LAB — LIPID PANEL
Cholesterol: 130 mg/dL (ref 0–200)
HDL: 41.4 mg/dL (ref >39.00)
LDL CALC: 61 mg/dL (ref 0–99)
NONHDL: 88.6
TRIGLYCERIDES: 136 mg/dL (ref 0.0–149.0)
Total CHOL/HDL Ratio: 3
VLDL: 27.2 mg/dL (ref 0.0–40.0)

## 2014-03-30 LAB — AST: AST: 17 U/L (ref 0–37)

## 2014-03-30 LAB — BASIC METABOLIC PANEL
BUN: 29 mg/dL — ABNORMAL HIGH (ref 6–23)
CALCIUM: 9.6 mg/dL (ref 8.4–10.5)
CO2: 27 meq/L (ref 19–32)
Chloride: 104 mEq/L (ref 96–112)
Creatinine, Ser: 1.03 mg/dL (ref 0.40–1.50)
GFR: 74.27 mL/min (ref >60.00)
Glucose, Bld: 110 mg/dL — ABNORMAL HIGH (ref 70–99)
Potassium: 4.2 mEq/L (ref 3.5–5.1)
Sodium: 136 mEq/L (ref 135–145)

## 2014-03-30 LAB — ALT: ALT: 12 U/L (ref 0–53)

## 2014-04-24 ENCOUNTER — Other Ambulatory Visit: Payer: Self-pay | Admitting: Internal Medicine

## 2014-04-25 NOTE — Telephone Encounter (Signed)
Pt requesting refill on Ergocalciferol, instructions were to take for 3 months. Should Pt continue with treatment?

## 2014-04-25 NOTE — Telephone Encounter (Signed)
No need for more ergocalciferol, needs to take OTC vitamin D daily between 1000- 2000 units, will recheck levels when he comes back

## 2014-05-16 ENCOUNTER — Telehealth: Payer: Self-pay | Admitting: Internal Medicine

## 2014-05-16 MED ORDER — GABAPENTIN 600 MG PO TABS: 600.0000 mg | ORAL_TABLET | Freq: Two times a day (BID) | ORAL | Status: DC

## 2014-05-16 NOTE — Telephone Encounter (Signed)
Gabapentin resent for 180 tablets and 2 refills to [facility].

## 2014-05-16 NOTE — Telephone Encounter (Signed)
WHEN DR PAZ SENT IT IN TO [facility] IT SHOULD HAVE BEEN 2 A DAY FOR 90 DAYS.  THE TOTAL THAT WENT TO [facility] WAS 90 NOT 180  PLEASE CORRECT   SHE ONLY GOT 90 FROM [facility].  SHE IS PAYING  $90 INSTEAD OF $30   HE ONLY HAS 5 DAYS LEFT   THIS NEEDS TO BE SENT ASAP

## 2014-07-06 ENCOUNTER — Telehealth: Payer: Self-pay | Admitting: Internal Medicine

## 2014-07-06 NOTE — Telephone Encounter (Signed)
Pre Visit letter sent  °

## 2014-07-19 IMAGING — CT CT ANGIO CHEST
2 of 5 series · 19 of 36 positions shown · IV contrast (omnipaque)
Comparison: Chest x-ray of 05/14/2011 and CT chest of 02/20/2011

CLINICAL DATA: Follow up of thoracic aortic aneurysm

CT ANGIOGRAPHY CHEST
TECHNIQUE: Multidetector CT imaging of the chest using the
standard protocol during bolus administration of intravenous
contrast. Multiplanar reconstructed images including MIPs were
obtained and reviewed to evaluate the vascular anatomy.
Contrast: 100mL OMNIPAQUE IOHEXOL 350 MG/ML SOLN

[Series 4: cta chest w/cm 3mm · axial · 0.72mm/px · z∈[-289,-16]mm · 18 of 99 slices shown]
[im 4/99  lung]
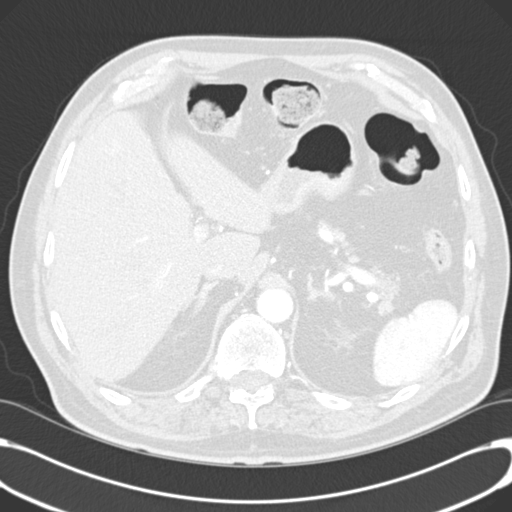
[im 11/99  mediastinal]
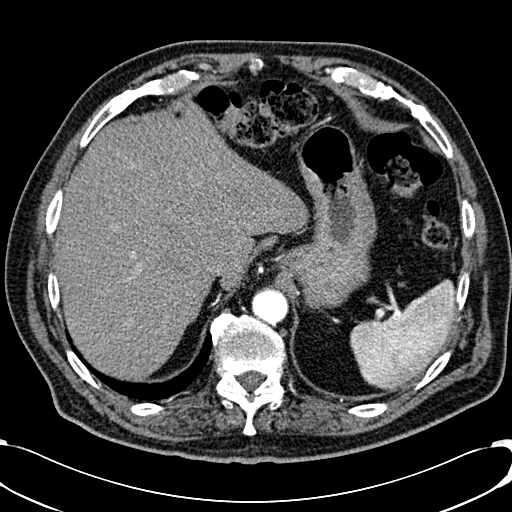
[im 15/99  lung]
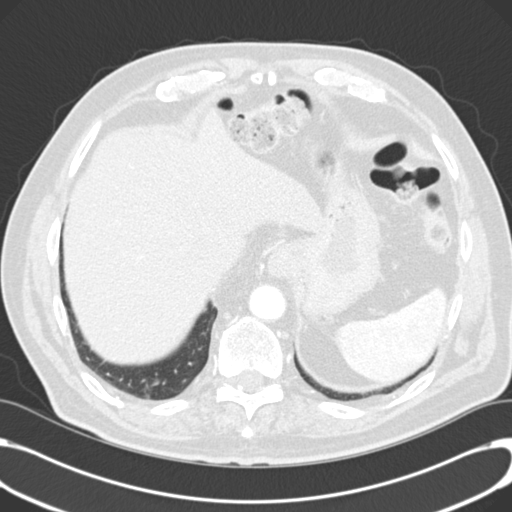
[im 22/99  mediastinal]
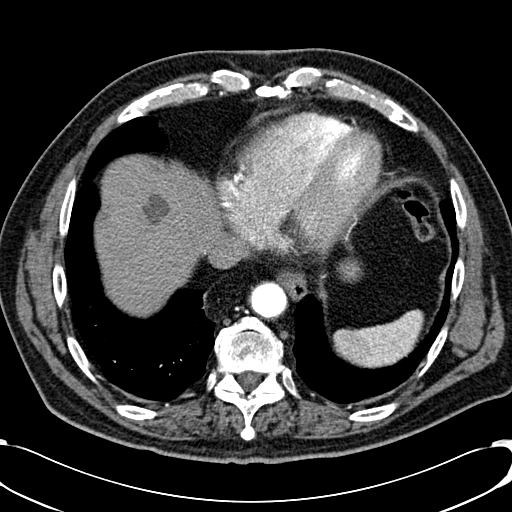
[im 26/99  lung]
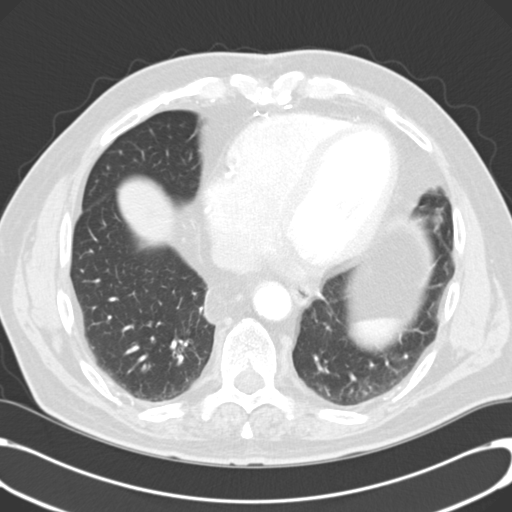
[im 30/99  mediastinal]
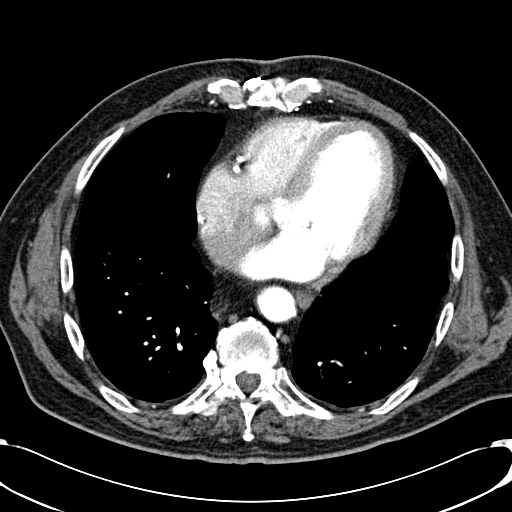
[im 37/99  lung]
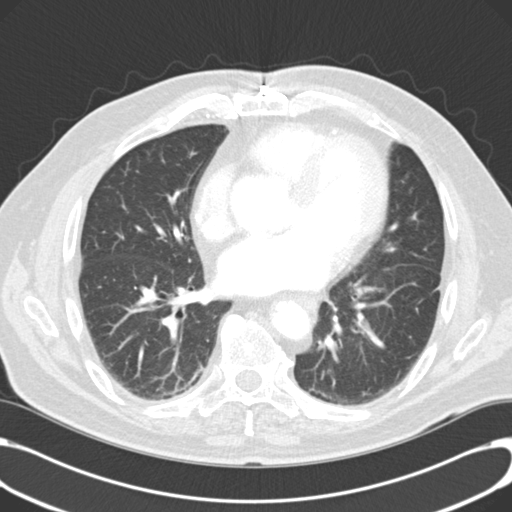
[im 40/99  mediastinal]
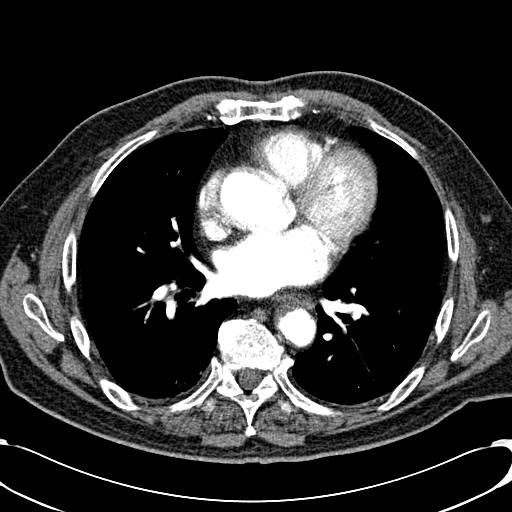
[im 48/99  lung]
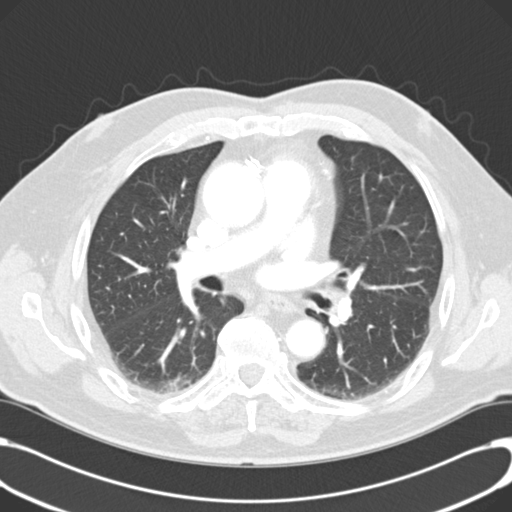
[im 51/99  mediastinal]
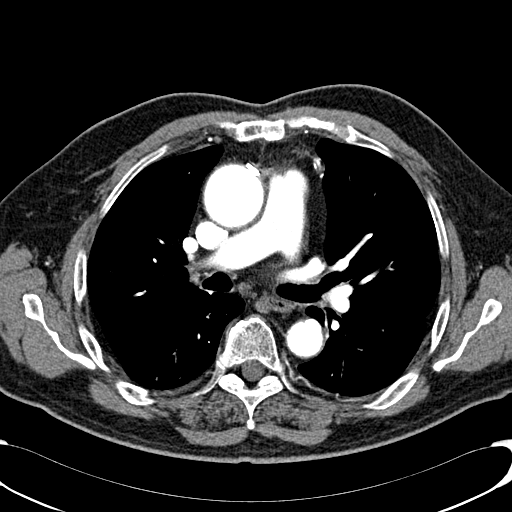
[im 59/99  lung]
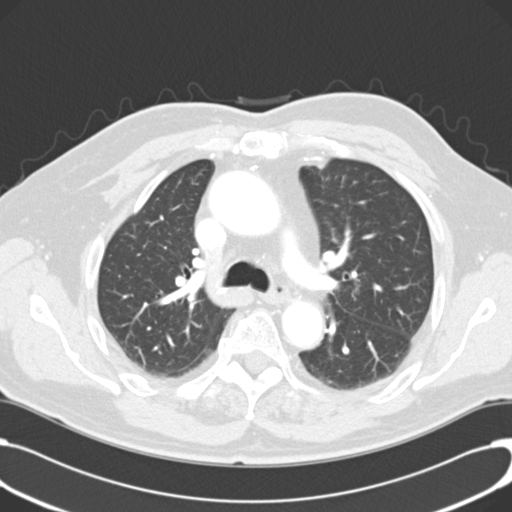
[im 62/99  mediastinal]
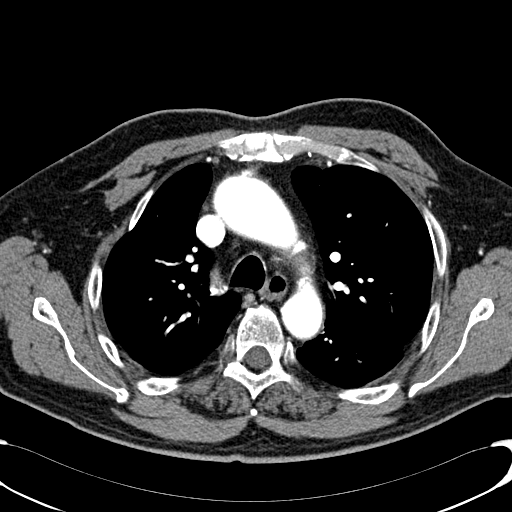
[im 69/99  lung]
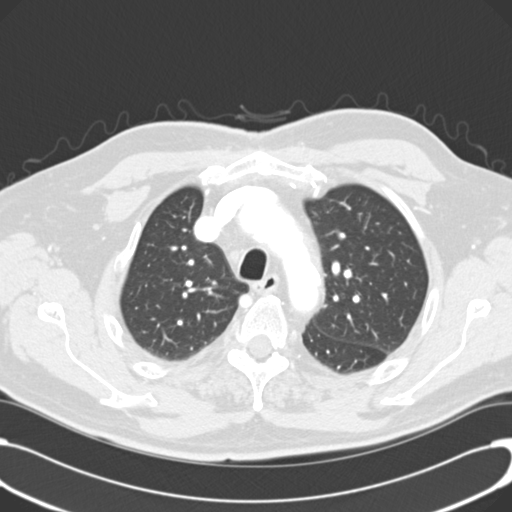
[im 73/99  mediastinal]
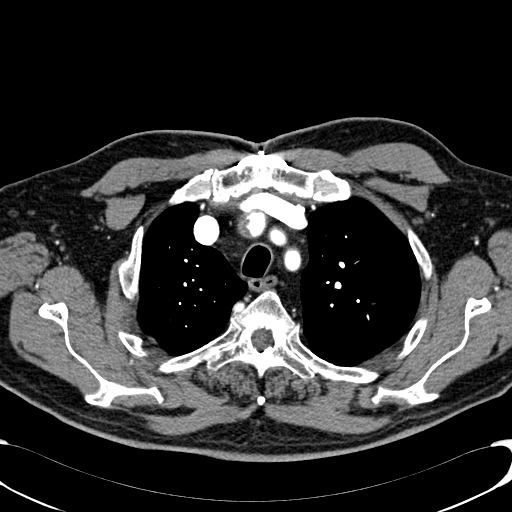
[im 77/99  lung]
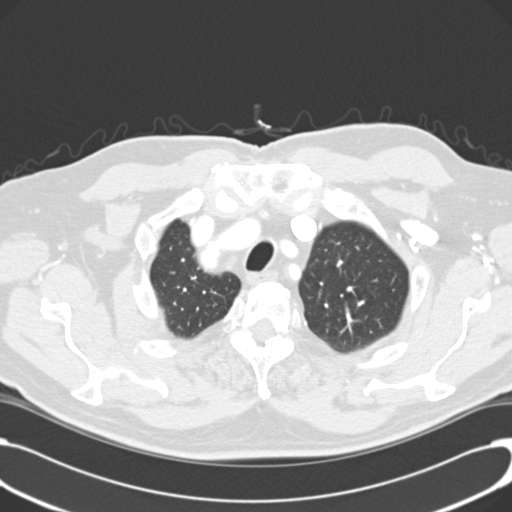
[im 84/99  mediastinal]
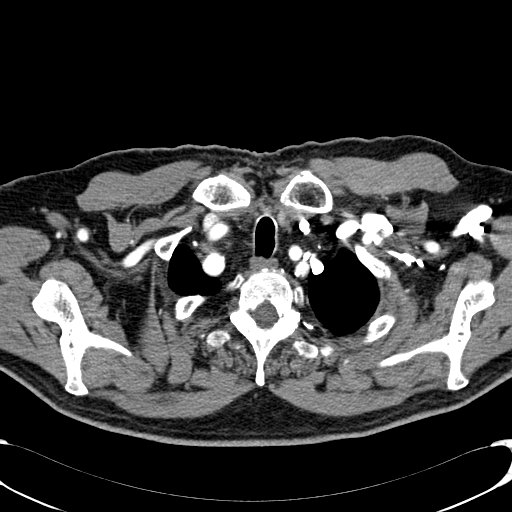
[im 88/99  lung]
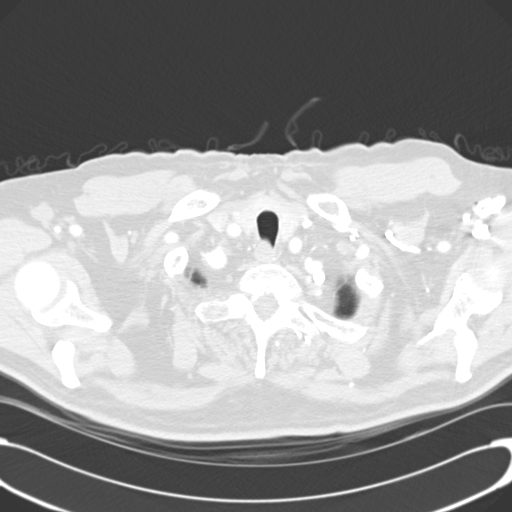
[im 95/99  mediastinal]
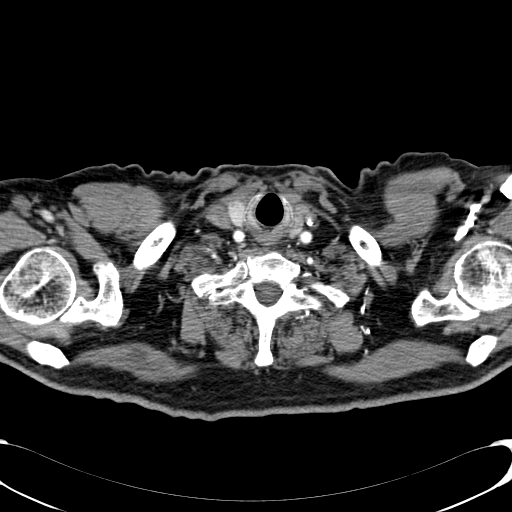

[Series 602: cor mpr · coronal · 0.72mm/px · 1 of 112 slices shown]
[im 56/112  mediastinal]
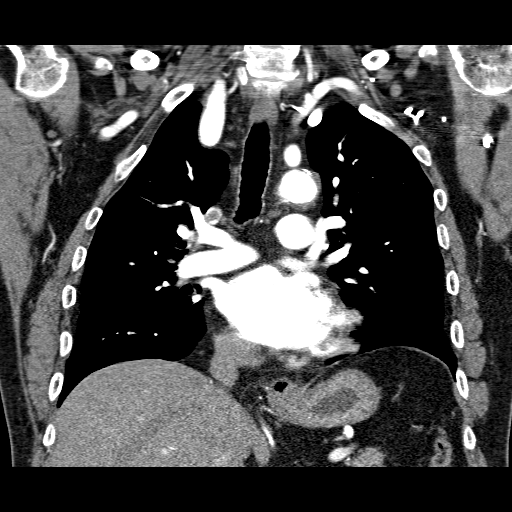

[19 of 36 positions shown; findings below may reference images not displayed]

FINDINGS: The fusiform dilatation of the ascending aorta is
relatively stable.  The maximum measurement at the level of the
main pulmonary artery is 4.3 cm compared to 4.6 cm previously.
Atheromatous change is noted at the origins of the great vessels
and throughout the descending thoracic aorta.  The pulmonary
arteries opacify with no significant abnormality noted.  Median
sternotomy sutures are noted from prior CABG.  Cardiomegaly is
stable.  No mediastinal or hilar adenopathy is seen.  Incidental
small thyroid nodules are noted of doubtful clinical significance.

On the lung window images, no lung parenchymal abnormality is seen.
No effusion is noted.  There are diffuse degenerative changes
throughout the thoracic spine. Incidental hepatic cyst is noted in
the dome of the right lobe which appears stable.
IMPRESSION: 1.  Stable fusiform dilatation of the ascending aorta with maximum
diameter of 4.3 cm.
2.  Diffuse atheromatous change of the aortic arch and descending
thoracic aorta.

## 2014-07-27 ENCOUNTER — Telehealth: Payer: Self-pay | Admitting: *Deleted

## 2014-07-27 ENCOUNTER — Encounter: Payer: Self-pay | Admitting: *Deleted

## 2014-07-27 NOTE — Telephone Encounter (Signed)
Pre-Visit Call completed with patient and chart updated.   Pre-Visit Info documented in Specialty Comments under [employer].    

## 2014-07-28 ENCOUNTER — Ambulatory Visit (INDEPENDENT_AMBULATORY_CARE_PROVIDER_SITE_OTHER): Payer: Medicare Other | Admitting: Internal Medicine

## 2014-07-28 ENCOUNTER — Encounter: Payer: Self-pay | Admitting: Internal Medicine

## 2014-07-28 VITALS — BP 136/82 | HR 62 | Temp 97.7°F | Ht 74.0 in | Wt 223.2 lb

## 2014-07-28 DIAGNOSIS — I10 Essential (primary) hypertension: Secondary | ICD-10-CM

## 2014-07-28 DIAGNOSIS — E041 Nontoxic single thyroid nodule: Secondary | ICD-10-CM

## 2014-07-28 DIAGNOSIS — Z0000 Encounter for general adult medical examination without abnormal findings: Secondary | ICD-10-CM | POA: Diagnosis not present

## 2014-07-28 DIAGNOSIS — E119 Type 2 diabetes mellitus without complications: Secondary | ICD-10-CM | POA: Diagnosis not present

## 2014-07-28 DIAGNOSIS — R29898 Other symptoms and signs involving the musculoskeletal system: Secondary | ICD-10-CM | POA: Diagnosis not present

## 2014-07-28 LAB — CBC WITH DIFFERENTIAL/PLATELET
Basophils Absolute: 0 10*3/uL (ref 0.0–0.1)
Basophils Relative: 0.4 % (ref 0.0–3.0)
EOS ABS: 0.3 10*3/uL (ref 0.0–0.7)
EOS PCT: 4.5 % (ref 0.0–5.0)
HEMATOCRIT: 42.2 % (ref 39.0–52.0)
Hemoglobin: 13.9 g/dL (ref 13.0–17.0)
Lymphocytes Relative: 24.2 % (ref 12.0–46.0)
Lymphs Abs: 1.7 10*3/uL (ref 0.7–4.0)
MCHC: 32.9 g/dL (ref 30.0–36.0)
MCV: 84.8 fl (ref 78.0–100.0)
MONO ABS: 0.5 10*3/uL (ref 0.1–1.0)
Monocytes Relative: 7.3 % (ref 3.0–12.0)
Neutro Abs: 4.4 10*3/uL (ref 1.4–7.7)
Neutrophils Relative %: 63.6 % (ref 43.0–77.0)
PLATELETS: 211 10*3/uL (ref 150.0–400.0)
RBC: 4.97 Mil/uL (ref 4.22–5.81)
RDW: 13.7 % (ref 11.5–15.5)
WBC: 6.9 10*3/uL (ref 4.0–10.5)

## 2014-07-28 LAB — HM DIABETES FOOT EXAM

## 2014-07-28 LAB — HEMOGLOBIN A1C: HEMOGLOBIN A1C: 6.4 % (ref 4.6–6.5)

## 2014-07-28 LAB — FOLATE: Folate: 20.1 ng/mL (ref >5.9)

## 2014-07-28 MED ORDER — PROPRANOLOL HCL ER 80 MG PO CP24: 80.0000 mg | ORAL_CAPSULE | Freq: Every day | ORAL | Status: DC

## 2014-07-28 MED ORDER — BENAZEPRIL HCL 40 MG PO TABS: 40.0000 mg | ORAL_TABLET | Freq: Every day | ORAL | Status: DC

## 2014-07-28 MED ORDER — ZOSTER VACCINE LIVE 19400 UNT/0.65ML ~~LOC~~ SOLR: 0.6500 mL | Freq: Once | SUBCUTANEOUS | Status: DC

## 2014-07-28 MED ORDER — METFORMIN HCL 850 MG PO TABS: 850.0000 mg | ORAL_TABLET | Freq: Two times a day (BID) | ORAL | Status: DC

## 2014-07-28 NOTE — Assessment & Plan Note (Signed)
well controlled, check a CBC

## 2014-07-28 NOTE — Assessment & Plan Note (Addendum)
Tdap-- 11/01/13  PNA-- 03/23/14 (13) pnm (23)-- next year Shingles-- rx provided    CCS: Due for a colonoscopy, he is 70, has a number of medical problems but in general is doing well. Refer to GI for consideration of a colonoscopy. Prostate cancer screening: Sees urology regularly  PSA: 08/26/12 at [facility] 2.02   Diet and exercise discussed

## 2014-07-28 NOTE — Assessment & Plan Note (Signed)
Leg weakness, Patient denies pain per se, had normal ABIs 12/2013 and a normal B12 12/2013. It could be simply deconditioning and he may need physical therapy, before we decide, will check a folic acid, nerve conduction study and a total CK.

## 2014-07-28 NOTE — Assessment & Plan Note (Addendum)
Thyroid nodule by CT, ultrasound 03-2014 show a very small nodules, no indication for bx

## 2014-07-28 NOTE — Progress Notes (Signed)
Pre visit review using our clinic review tool, if applicable. No additional management support is needed unless otherwise documented below in the visit note. 

## 2014-07-28 NOTE — Progress Notes (Signed)
Subjective:    Patient ID: William Faulkner, male    DOB: [birth year], 80 y.o.   MRN: [id number]  DOS:  07/28/2014 Type of visit - description :  Here for Medicare AWV:  1. Risk factors based on Past M, S, F history: reviewed 2. Physical Activities:  Walks regularly , plays golf sometimes 3. Depression/mood: neg screening  4. Hearing:  No problems noted or reported  5. ADL's: independent, drives  6. Fall Risk:  Near fall yesterday, prevention discussed , see AVS 7. home Safety: does feel safe at home  8. Height, weight, & visual acuity: see VS, sees eye doctor regulalrly William Faulkner), early glaucoma   9. Counseling: provided 10. Labs ordered based on risk factors: if needed  11. Referral Coordination: if needed 12. Care Plan, see assessment and plan , written personalized plan provided , see AVS 13. Cognitive Assessment: motor skills and cognition appropriate for age 54. Care team updated, see Snap Shot  15. End-of-life care, discussed, has a living will    In addition, today we discussed the following: CAD, note from cardiology reviewed Patient is complaining of muscle weakness, from the back down to his legs. No pain per se, no bladder or bowel incontinence, some calf pain after walking [place] the golf [place] , pain decreased with rest. No paresthesias other than his diabetes neuropathy ---> numbness in a sock distribution. Had one episode of dizziness yesterday after he quickly stood up, he almost fall but he was able to grab the table. Symptoms passed quickly. Diabetes, good compliance of medication, ambulatory blood sugars between 110 and 130 Hypertension, good compliance of medication, ambulatory BPs never less than 115 Crestor, good compliance with medications.    Review of Systems Constitutional: No fever. No chills. No unexplained wt changes. No unusual sweats  HEENT: No dental problems, no ear discharge, no facial swelling, no voice changes. No eye discharge, no eye  redness ,  no  intolerance to light   Respiratory: No wheezing , no  difficulty breathing. No cough , no mucus production  Cardiovascular: No CP, no leg swelling , no  Palpitations  GI: no nausea, no vomiting, no diarrhea , no  abdominal pain.  No blood in the stools. No dysphagia, no odynophagia    Endocrine: No polyphagia, no polyuria , no polydipsia  GU: No dysuria, gross hematuria, difficulty urinating. No urinary urgency, no frequency.  Musculoskeletal: No joint swellings or unusual aches or pains  Skin: No change in the color of the skin, palor , no  Rash  Allergic, immunologic: No environmental allergies , no  food allergies  Neurological:  no  syncope. No headaches. No diplopia, no slurred, no slurred speech,  no facial  Numbness  Hematological: No enlarged lymph nodes, no easy bruising , no unusual bleedings  Psychiatry: No suicidal ideas, no hallucinations, no beavior problems, no confusion.  No unusual/severe anxiety, no depression    Past Medical History  Diagnosis Date  . Hyperlipidemia   . HYPERTENSION, ESSENTIAL NOS   . CEREBROVASCULAR DISEASE   . CAD, ARTERY BYPASS GRAFT   . AORTIC INSUFFICIENCY   . AORTIC ANEUR UNSPEC SITE WITHOUT MENTION RUPTURE   . DIAB W/O COMP TYPE II/UNS NOT STATED UNCNTRL   . NEOPLASM, MALIGNANT, BLADDER, HX OF   . BPH (benign prostatic hyperplasia)   . Rotator cuff tear     Left shoulder,     Past Surgical History  Procedure Laterality Date  . Rotator cuff repair  2009  right; Dr Shellia Carwin  . Bladder tumor excision      Surgery x6, Dr.Tannebaum  . Colonoscopy  2002/2004    Negative  . Lumbar epidural injection  2008  . Colonoscopy w/ polypectomy      Dr Ree [facility]ay [facility]  . Blepharoplasty  1980    bilaterally, Dr Gershon Crane  . Coronary artery bypass graft  2011    x 5  . Back surgery  2003    lower  . Inguinal hernia repair  10/24/2011    Procedure: HERNIA REPAIR INGUINAL ADULT;  Surgeon: Earnstine Regal, MD;  Location: [facility];   Service: General;  Laterality: Right;  Repair Right Inguinal Hernia with Mesh, Umbilical Hernia Repair with Mesh  . Umbilical hernia repair  10/24/2011    Procedure: HERNIA REPAIR UMBILICAL ADULT;  Surgeon: Earnstine Regal, MD;  Location: [facility];  Service: General;  Laterality: N/A;  . Cystoscopy  09/02/2012  . Shoulder arthroscopy with rotator cuff repair and subacromial decompression  02/14/2014    Right, SAD/DCR, biceps tenodesis    History   Social History  . Marital Status: Married    Spouse Name: N/A  . Number of Children: 3  . Years of Education: N/A   Occupational History  . retired    Social History Main Topics  . Smoking status: Former Smoker -- 0.30 packs/day for 15 years    Types: Cigarettes    Quit date: 01/22/1984  . Smokeless tobacco: Never Used  . Alcohol Use: No     Comment: rare, wine   . Drug Use: No  . Sexual Activity: Not on file   Other Topics Concern  . Not on file   Social History Narrative   8 G-children   Married x 56 years     Family History  Problem Relation Age of Onset  . Diabetes Mother   . Tremor Father   . Stroke Neg Hx   . Heart disease Neg Hx   . Cancer Neg Hx        Medication List       This list is accurate as of: 07/28/14  5:30 PM.  Always use your most recent med list.               aspirin 81 MG tablet  Take 81 mg by mouth daily.     benazepril 40 MG tablet  Commonly known as:  LOTENSIN  Take 1 tablet (40 mg total) by mouth daily.     cholecalciferol 1000 UNITS tablet  Commonly known as:  VITAMIN D  Take 1,000 Units by mouth daily.     gabapentin 600 MG tablet  Commonly known as:  NEURONTIN  Take 1 tablet (600 mg total) by mouth 2 (two) times daily.     hydrochlorothiazide 12.5 MG capsule  Commonly known as:  MICROZIDE  Take 1 capsule (12.5 mg total) by mouth daily.     ketoconazole 2 % cream  Commonly known as:  NIZORAL  Apply 1 application topically 2 (two) times daily.     metFORMIN 850 MG tablet    Commonly known as:  GLUCOPHAGE  Take 1 tablet (850 mg total) by mouth 2 (two) times daily with a meal.     propranolol ER 80 MG 24 hr capsule  Commonly known as:  INDERAL LA  Take 1 capsule (80 mg total) by mouth daily.     rosuvastatin 20 MG tablet  Commonly known as:  CRESTOR  Take 1 tablet (20  mg total) by mouth daily.     timolol 0.5 % ophthalmic solution  Commonly known as:  TIMOPTIC  Place 1 drop into both eyes every morning. As directed     zoster vaccine live (PF) 19400 UNT/0.65ML injection  Commonly known as:  ZOSTAVAX  Inject 19,400 Units into the skin once.           Objective:   Physical Exam BP 136/82 mmHg  Pulse 62  Temp(Src) 97.7 F (36.5 C) (Oral)  Ht 6\' 2"  (1.88 m)  Wt 223 lb 4 oz (101.266 kg)  BMI 28.65 kg/m2  SpO2 99% General:   Well developed, well nourished . NAD.  Neck:  Full range of motion. Supple.  HEENT:  Normocephalic . Face symmetric, atraumatic Lungs:  CTA B Normal respiratory effort, no intercostal retractions, no accessory muscle use. Heart: RRR, + systolic murmur more noticeable at the right side of the sternumibial edema bilaterally  Abdomen:  Not distended, soft, non-tender. No rebound or rigidity. No bruit Skin: Exposed areas without rash. Not pale. Not jaundice Diabetic foot exam: Pinprick examination: Patchy decrease distally. Skin slightly dry  Neurologic:  alert & oriented X3.  Speech normal, gait appropriate for age and unassisted Strength symmetric and appropriate for age.  Psych: Cognition and judgment appear intact.  Cooperative with normal attention span and concentration.  Behavior appropriate. No anxious or depressed appearing.        Assessment & Plan:    Cardiovascular issues: Follow-up by cardiology  High cholesterol, on Crestor, results satisfactory  Episode of dizziness, likely positional/orthostatic, BP does not seem to be overcontrolled. Observation.

## 2014-07-28 NOTE — Assessment & Plan Note (Signed)
Due for a  A1c, labs

## 2014-07-28 NOTE — Patient Instructions (Addendum)
Get your blood work before you leave    Please consider visit these websites for more information:  www.begintheconversation.org  theconversationproject.org    Fall Prevention and Home Safety Falls cause injuries and can affect all age groups. It is possible to use preventive measures to significantly decrease the likelihood of falls. There are many simple measures which can make your home safer and prevent falls. OUTDOORS  Repair cracks and edges of walkways and driveways.  Remove high doorway thresholds.  Trim shrubbery on the main path into your home.  Have good outside lighting.  Clear walkways of tools, rocks, debris, and clutter.  Check that handrails are not broken and are securely fastened. Both sides of steps should have handrails.  Have leaves, snow, and ice cleared regularly.  Use sand or salt on walkways during winter months.  In the garage, clean up grease or oil spills. BATHROOM  Install night lights.  Install grab bars by the toilet and in the tub and shower.  Use non-skid mats or decals in the tub or shower.  Place a plastic non-slip stool in the shower to sit on, if needed.  Keep floors dry and clean up all water on the floor immediately.  Remove soap buildup in the tub or shower on a regular basis.  Secure bath mats with non-slip, double-sided rug tape.  Remove throw rugs and tripping hazards from the floors. BEDROOMS  Install night lights.  Make sure a bedside light is easy to reach.  Do not use oversized bedding.  Keep a telephone by your bedside.  Have a firm chair with side arms to use for getting dressed.  Remove throw rugs and tripping hazards from the floor. KITCHEN  Keep handles on pots and pans turned toward the center of the stove. Use back burners when possible.  Clean up spills quickly and allow time for drying.  Avoid walking on wet floors.  Avoid hot utensils and knives.  Position shelves so they are not too high  or low.  Place commonly used objects within easy reach.  If necessary, use a sturdy step stool with a grab bar when reaching.  Keep electrical cables out of the way.  Do not use floor polish or wax that makes floors slippery. If you must use wax, use non-skid floor wax.  Remove throw rugs and tripping hazards from the floor. STAIRWAYS  Never leave objects on stairs.  Place handrails on both sides of stairways and use them. Fix any loose handrails. Make sure handrails on both sides of the stairways are as long as the stairs.  Check carpeting to make sure it is firmly attached along stairs. Make repairs to worn or loose carpet promptly.  Avoid placing throw rugs at the top or bottom of stairways, or properly secure the rug with carpet tape to prevent slippage. Get rid of throw rugs, if possible.  Have an electrician put in a light switch at the top and bottom of the stairs. OTHER FALL PREVENTION TIPS  Wear low-heel or rubber-soled shoes that are supportive and fit well. Wear closed toe shoes.  When using a stepladder, make sure it is fully opened and both spreaders are firmly locked. Do not climb a closed stepladder.  Add color or contrast paint or tape to grab bars and handrails in your home. Place contrasting color strips on first and last steps.  Learn and use mobility aids as needed. Install an electrical emergency response system.  Turn on lights to avoid dark areas.   Replace light bulbs that burn out immediately. Get light switches that glow.  Arrange furniture to create clear pathways. Keep furniture in the same place.  Firmly attach carpet with non-skid or double-sided tape.  Eliminate uneven floor surfaces.  Select a carpet pattern that does not visually hide the edge of steps.  Be aware of all pets. OTHER HOME SAFETY TIPS  Set the water temperature for 120 F (48.8 C).  Keep emergency numbers on or near the telephone.  Keep smoke detectors on every level of  the home and near sleeping areas. Document Released: 12/28/2001 Document Revised: 07/09/2011 Document Reviewed: 03/29/2011 ExitCare Patient Information 2015 [employer]. This information is not intended to replace advice given to you by your health care provider. Make sure you discuss any questions you have with your health care provider.   Preventive Care for Adults Ages 65 and over  Blood pressure check.** / Every 1 to 2 years.  Lipid and cholesterol check.**/ Every 5 years beginning at age 20.  Lung cancer screening. / Every year if you are aged 55-80 years and have a 30-pack-year history of smoking and currently smoke or have quit within the past 15 years. Yearly screening is stopped once you have quit smoking for at least 15 years or develop a health problem that would prevent you from having lung cancer treatment.  Fecal occult blood test (FOBT) of stool. / Every year beginning at age 50 and continuing until age 75. You may not have to do this test if you get a colonoscopy every 10 years.  Flexible sigmoidoscopy** or colonoscopy.** / Every 5 years for a flexible sigmoidoscopy or every 10 years for a colonoscopy beginning at age 50 and continuing until age 75.  Hepatitis C blood test.** / For all people born from 1945 through 1965 and any individual with known risks for hepatitis C.  Abdominal aortic aneurysm (AAA) screening.** / A one-time screening for ages 65 to 75 years who are current or former smokers.  Skin self-exam. / Monthly.  Influenza vaccine. / Every year.  Tetanus, diphtheria, and acellular pertussis (Tdap/Td) vaccine.** / 1 dose of Td every 10 years.  Varicella vaccine.** / Consult your health care provider.  Zoster vaccine.** / 1 dose for adults aged 60 years or older.  Pneumococcal 13-valent conjugate (PCV13) vaccine.** / Consult your health care provider.  Pneumococcal polysaccharide (PPSV23) vaccine.** / 1 dose for all adults aged 65 years and  older.  Meningococcal vaccine.** / Consult your health care provider.  Hepatitis A vaccine.** / Consult your health care provider.  Hepatitis B vaccine.** / Consult your health care provider.  Haemophilus influenzae type b (Hib) vaccine.** / Consult your health care provider. **Family history and personal history of risk and conditions may change your health care provider's recommendations. Document Released: 03/05/2001 Document Revised: 01/12/2013 Document Reviewed: 06/04/2010 ExitCare Patient Information 2015 [employer]. This information is not intended to replace advice given to you by your health care provider. Make sure you discuss any questions you have with your health care provider.   

## 2014-07-29 LAB — CK TOTAL AND CKMB (NOT AT ARMC)
CK, MB: 6.3 ng/mL — ABNORMAL HIGH (ref 0.0–5.0)
Relative Index: 2.7 (ref 0.0–4.0)
Total CK: 232 U/L (ref 7–232)

## 2014-08-01 ENCOUNTER — Other Ambulatory Visit: Payer: Self-pay | Admitting: *Deleted

## 2014-08-01 DIAGNOSIS — R29898 Other symptoms and signs involving the musculoskeletal system: Secondary | ICD-10-CM

## 2014-08-02 ENCOUNTER — Other Ambulatory Visit: Payer: Self-pay | Admitting: *Deleted

## 2014-08-02 ENCOUNTER — Telehealth: Payer: Self-pay | Admitting: Internal Medicine

## 2014-08-02 ENCOUNTER — Ambulatory Visit (INDEPENDENT_AMBULATORY_CARE_PROVIDER_SITE_OTHER): Payer: Medicare Other | Admitting: Neurology

## 2014-08-02 DIAGNOSIS — R208 Other disturbances of skin sensation: Secondary | ICD-10-CM | POA: Diagnosis not present

## 2014-08-02 DIAGNOSIS — G609 Hereditary and idiopathic neuropathy, unspecified: Secondary | ICD-10-CM

## 2014-08-02 DIAGNOSIS — R200 Anesthesia of skin: Secondary | ICD-10-CM

## 2014-08-02 NOTE — Telephone Encounter (Signed)
Spouse returning your call best [phone]

## 2014-08-02 NOTE — Telephone Encounter (Signed)
Nerve conduction study report: The electrophysiologic findings are most consistent with a generalized sensorimotor polyneuropathy, axon loss in type, affecting the lower extremities. Overall, these findings are moderately severe in degree electrically with respect to sensory nerves. There is no evidence of a superimposed lumbosacral radiculopathy or diffuse myopathy.  Advise patient:  1. Nerve conduction confirm neuropathy, likely from diabetes. 2. He complained of leg weakness, recommend physical therapy, arrange if patient is in agreement.

## 2014-08-02 NOTE — Telephone Encounter (Signed)
LMOM at home number informing Pt to return call.

## 2014-08-02 NOTE — Procedures (Signed)
[facility]  [place], [place]  [place], [place] [address] Tel: [phone] Fax:  [phone] Test Date:  08/02/2014  Patient: William Faulkner DOB: 1934-11-21 Physician: Narda Amber  Sex: Male Height: 6\' 2"  Ref Phys: Kathlene November, M.D.  ID#: [id number] Temp: 35.7C Technician: Jerilynn Mages. Dean   Patient Complaints: This is a 80 year old gentleman presenting for evaluation of bilateral feet paresthesias and generalized weakness of the legs.   NCV & EMG Findings: Extensive electrodiagnostic testing of the right lower extremity and additional studies of the left shows:  1. Bilateral sural and superficial peroneal sensory responses are absent. 2. Bilateral tibial motor responses are essentially absent. Bilateral peroneal motor responses recording at the extensor digitorum brevis are markedly reduced, however when recording at the tibialis anterior, motor responses are within normal limits. 3. Mild chronic motor axon loss changes are seen in the medial gastrocnemius and flexor digitorum longus muscles bilaterally, without accompanied active denervation.   Impression: The electrophysiologic findings are most consistent with a generalized sensorimotor polyneuropathy, axon loss in type, affecting the lower extremities. Overall, these findings are moderately severe in degree electrically with respect to sensory nerves.  There is no evidence of a superimposed lumbosacral radiculopathy or diffuse myopathy.  ___________________________ Narda Amber, DO    Nerve Conduction Studies Anti Sensory Summary Table   Stim Site NR Peak (ms) Norm Peak (ms) P-T Amp (V) Norm P-T Amp  Left Sup Peroneal Anti Sensory (Ant Lat Mall)  12 cm NR  <4.6  >3  Right Sup Peroneal Anti Sensory (Ant Lat Mall)  12 cm NR  <4.6  >3  Left Sural Anti Sensory (Lat Mall)  Calf NR  <4.6  >3  Right Sural Anti Sensory (Lat Mall)  Calf NR  <4.6  >3   Motor Summary Table   Stim Site NR Onset (ms) Norm Onset  (ms) O-P Amp (mV) Norm O-P Amp Site1 Site2 Delta-0 (ms) Dist (cm) Vel (m/s) Norm Vel (m/s)  Left Peroneal Motor (Ext Dig Brev)  Ankle    4.8 <6.0 0.3 >2.5 B Fib Ankle 9.2 36.0 39 >40  B Fib    14.0  0.2  Poplt B Fib 2.6 10.0 38 >40  Poplt    16.6  0.2         Right Peroneal Motor (Ext Dig Brev)  Ankle    5.5 <6.0 0.2 >2.5 B Fib Ankle 8.1 35.0 43 >40  B Fib    13.6  0.2  Poplt B Fib 3.4 10.0 29 >40  Poplt    17.0  0.2         Left Peroneal TA Motor (Tib Ant)  Fib Head    3.7 <4.5 3.6 >3 Poplit Fib Head 1.7 8.0 47 >40  Poplit    5.4  3.6         Right Peroneal TA Motor (Tib Ant)  Fib Head    3.7 <4.5 3.2 >3 Poplit Fib Head 1.8 10.0 56 >40  Poplit    5.5  3.0         Left Tibial Motor (Abd Hall Brev)  Ankle    4.8 <6.0 0.1 >4 Knee Ankle  44.0  >40  Knee NR            Right Tibial Motor (Abd Hall Brev)  Ankle    5.8 <6.0 0.1 >4 Knee Ankle  43.0  >40  Knee NR             EMG  Side Muscle Ins Act Fibs Psw Fasc Number Recrt Dur Dur. Amp Amp. Poly Poly. Comment  Right AntTibialis Nml Nml Nml Nml 1- Mod-R Few 1+ Few 1+ Nml Nml N/A  Right GluteusMed Nml Nml Nml Nml Nml Nml Nml Nml Nml Nml Nml Nml N/A  Right Flex Dig Long Nml Nml Nml Nml 1- Mod-R Some 1+ Nml Nml Nml Nml N/A  Right Gastroc Nml Nml Nml Nml Nml Nml Nml Nml Nml Nml Nml Nml N/A  Right RectFemoris Nml Nml Nml Nml Nml Nml Nml Nml Nml Nml Nml Nml N/A  Right BicepsFemS Nml Nml Nml Nml Nml Nml Nml Nml Nml Nml Nml Nml N/A  Left Gastroc Nml Nml Nml Nml Nml Nml Nml Nml Nml Nml Nml Nml N/A  Left AntTibialis Nml Nml Nml Nml 1- Mod-R Few 1+ Few 1+ Nml Nml N/A  Left RectFemoris Nml Nml Nml Nml Nml Nml Nml Nml Nml Nml Nml Nml N/A  Left Flex Dig Long Nml Nml Nml Nml 1- Mod-R Some 1+ Nml Nml Nml Nml N/A      Waveforms:

## 2014-08-03 NOTE — Telephone Encounter (Signed)
Spoke with Pt and Pt's wife, Dianah Field, informed them of nerve conduction study results. Informed them of Dr. Ethel Rana recommendations of PT and Pt declined at this time since he just completed PT for his shoulder. He did state if he changes his mind he will let us know. Pt questioned if his hands were checked since he was having tremors in hands, I informed her they did not. She also questioned whether it showed if Pt had Parkinson's or not, which I told her that Dr. Larose Kells did not inform as such. Pt and Flora verbalized understanding.

## 2014-08-10 ENCOUNTER — Ambulatory Visit (INDEPENDENT_AMBULATORY_CARE_PROVIDER_SITE_OTHER): Payer: Medicare Other | Admitting: Internal Medicine

## 2014-08-10 ENCOUNTER — Encounter: Payer: Self-pay | Admitting: Internal Medicine

## 2014-08-10 VITALS — BP 152/76 | HR 56 | Temp 97.8°F | Ht 74.0 in | Wt 223.1 lb

## 2014-08-10 DIAGNOSIS — R42 Dizziness and giddiness: Secondary | ICD-10-CM

## 2014-08-10 DIAGNOSIS — I10 Essential (primary) hypertension: Secondary | ICD-10-CM

## 2014-08-10 NOTE — Progress Notes (Signed)
Subjective:    Patient ID: William Faulkner, male    DOB: 10-18-1934, 80 y.o.   MRN: [id number]  DOS:  08/10/2014 Type of visit - description : Acute Interval history: Since the last office visit he continue having dizziness. Few days ago he bent down and squat to pick up something from the floor, when he was coming back up he felt dizzy lost his balance and fell backwards. No major injury Also, when he is in bed and turns his head, feels dizzy described as the room moving around and has some nausea. All symptoms are transient  Review of Systems No chest pain, difficulty breathing or palpitations. No headache, diplopia, slurred speech or motor deficits  Past Medical History  Diagnosis Date  . Hyperlipidemia   . HYPERTENSION, ESSENTIAL NOS   . CEREBROVASCULAR DISEASE   . CAD, ARTERY BYPASS GRAFT   . AORTIC INSUFFICIENCY   . AORTIC ANEUR UNSPEC SITE WITHOUT MENTION RUPTURE   . DIAB W/O COMP TYPE II/UNS NOT STATED UNCNTRL   . NEOPLASM, MALIGNANT, BLADDER, HX OF   . BPH (benign prostatic hyperplasia)   . Rotator cuff tear     Left shoulder,     Past Surgical History  Procedure Laterality Date  . Rotator cuff repair  2009    right; Dr Shellia Carwin  . Bladder tumor excision      Surgery x6, Dr.Tannebaum  . Colonoscopy  2002/2004    Negative  . Lumbar epidural injection  2008  . Colonoscopy w/ polypectomy      Dr Ree Shay [facility]  . Blepharoplasty  1980    bilaterally, Dr Gershon Crane  . Coronary artery bypass graft  2011    x 5  . Back surgery  2003    lower  . Inguinal hernia repair  10/24/2011    Procedure: HERNIA REPAIR INGUINAL ADULT;  Surgeon: Earnstine Regal, MD;  Location: [facility];  Service: General;  Laterality: Right;  Repair Right Inguinal Hernia with Mesh, Umbilical Hernia Repair with Mesh  . Umbilical hernia repair  10/24/2011    Procedure: HERNIA REPAIR UMBILICAL ADULT;  Surgeon: Earnstine Regal, MD;  Location: [facility];  Service: General;  Laterality: N/A;  . Cystoscopy   09/02/2012  . Shoulder arthroscopy with rotator cuff repair and subacromial decompression  02/14/2014    Right, SAD/DCR, biceps tenodesis    History   Social History  . Marital Status: Married    Spouse Name: N/A  . Number of Children: 3  . Years of Education: N/A   Occupational History  . retired    Social History Main Topics  . Smoking status: Former Smoker -- 0.30 packs/day for 15 years    Types: Cigarettes    Quit date: 01/22/1984  . Smokeless tobacco: Never Used  . Alcohol Use: No     Comment: rare, wine   . Drug Use: No  . Sexual Activity: Not on file   Other Topics Concern  . Not on file   Social History Narrative   8 G-children   Married x 56 years        Medication List       This list is accurate as of: 08/10/14  5:10 PM.  Always use your most recent med list.               aspirin 81 MG tablet  Take 81 mg by mouth daily.     benazepril 40 MG tablet  Commonly known as:  LOTENSIN  Take 1 tablet (40 mg total) by mouth daily.     cholecalciferol 1000 UNITS tablet  Commonly known as:  VITAMIN D  Take 1,000 Units by mouth daily.     gabapentin 600 MG tablet  Commonly known as:  NEURONTIN  Take 1 tablet (600 mg total) by mouth 2 (two) times daily.     hydrochlorothiazide 12.5 MG capsule  Commonly known as:  MICROZIDE  Take 1 capsule (12.5 mg total) by mouth daily.     ketoconazole 2 % cream  Commonly known as:  NIZORAL  Apply 1 application topically 2 (two) times daily.     metFORMIN 850 MG tablet  Commonly known as:  GLUCOPHAGE  Take 1 tablet (850 mg total) by mouth 2 (two) times daily with a meal.     propranolol ER 80 MG 24 hr capsule  Commonly known as:  INDERAL LA  Take 1 capsule (80 mg total) by mouth daily.     rosuvastatin 20 MG tablet  Commonly known as:  CRESTOR  Take 1 tablet (20 mg total) by mouth daily.     timolol 0.5 % ophthalmic solution  Commonly known as:  TIMOPTIC  Place 1 drop into both eyes every morning. As  directed           Objective:   Physical Exam BP 152/76 mmHg  Pulse 56  Temp(Src) 97.8 F (36.6 C) (Oral)  Ht 6\' 2"  (1.88 m)  Wt 223 lb 2 oz (101.209 kg)  BMI 28.64 kg/m2  SpO2 96%    General:   Well developed, well nourished . NAD.  HEENT:  Normocephalic . Face symmetric, atraumatic. Good carotid pulses Lungs:  CTA B Normal respiratory effort, no intercostal retractions, no accessory muscle use. Heart: RRR,  no murmur.  No pretibial edema bilaterally  Skin: Not pale. Not jaundice Neurologic:  alert & oriented X3.  Speech normal, gait appropriate for age and unassisted DTRs symmetric (decrease of the lower extremities) Psych--  Cognition and judgment appear intact.  Cooperative with normal attention span and concentration.  Behavior appropriate. No anxious or depressed appearing.   Assessment & Plan:

## 2014-08-10 NOTE — Assessment & Plan Note (Signed)
Holding diabetics, see comments under dizziness

## 2014-08-10 NOTE — Assessment & Plan Note (Signed)
Dizziness, Likely a peripheral  issue versus side effects from medications versus a combination of both.. He has been taking Inderal for more than a year and started hydrochlorothiazide is 11-2013. We talk about possibly doing an MRI or referring to neurology. Also discussed vestibular rehabilitation or discontinue hydrochlorothiazide. He feels like hydrochlorothiazide is  Contributing significantly to his sx so will hold it, watch closely his BP ans monitor his sx If the BP increases, we could use a low dose of hydrochlorothiazide (6.5 mg) or introduce Procardia noting that he had some swelling with amlodipine. The patient and the wife are in agreement, see instructions  Today , I spent more than 25   min with the patient: >50% of the time counseling regards plan dizziness, what is in her ear problem, answer multiple questions to the best of my ability

## 2014-08-10 NOTE — Patient Instructions (Signed)
Hold hydrochlorothiazide  Check the  blood pressure every day  Be sure your blood pressure is between 110/65 and  145/85.   Call if your blood pressure increases or if your dizziness is not gradually improving

## 2014-08-10 NOTE — Progress Notes (Signed)
Pre visit review using our clinic review tool, if applicable. No additional management support is needed unless otherwise documented below in the visit note. 

## 2014-08-17 ENCOUNTER — Ambulatory Visit (INDEPENDENT_AMBULATORY_CARE_PROVIDER_SITE_OTHER): Payer: Medicare Other | Admitting: Physician Assistant

## 2014-08-17 ENCOUNTER — Encounter: Payer: Self-pay | Admitting: Physician Assistant

## 2014-08-17 VITALS — BP 148/64 | HR 64 | Temp 97.9°F | Ht 74.0 in | Wt 227.6 lb

## 2014-08-17 DIAGNOSIS — J208 Acute bronchitis due to other specified organisms: Secondary | ICD-10-CM | POA: Insufficient documentation

## 2014-08-17 MED ORDER — BENZONATATE 200 MG PO CAPS: 200.0000 mg | ORAL_CAPSULE | Freq: Two times a day (BID) | ORAL | Status: DC | PRN

## 2014-08-17 NOTE — Patient Instructions (Signed)
Your symptoms and exam are consistent with a viral bronchitis. Please stay well hydrated and get plenty of rest. Take the Tessalon as directed for cough. Start a Mucinex (Plain) of Mucinex-DM for mucous and cough. Put a humidifier in the bedroom.  Call or return to clinic if symptoms are not resolving.

## 2014-08-17 NOTE — Progress Notes (Signed)
Patient presents to clinic today c/o chest congestion, cough productive of yellow sputum, runny nose and sore throat. Denies fever, chest pain or shortness of breath. Denies nausea or vomiting. Denies hx of asthma or COPD. Denies smoking history. Denies recent travel or sick contact.  Has taken Robitussin and tylenol with cough.  Past Medical History  Diagnosis Date  . Hyperlipidemia   . HYPERTENSION, ESSENTIAL NOS   . CEREBROVASCULAR DISEASE   . CAD, ARTERY BYPASS GRAFT   . AORTIC INSUFFICIENCY   . AORTIC ANEUR UNSPEC SITE WITHOUT MENTION RUPTURE   . DIAB W/O COMP TYPE II/UNS NOT STATED UNCNTRL   . NEOPLASM, MALIGNANT, BLADDER, HX OF   . BPH (benign prostatic hyperplasia)   . Rotator cuff tear     Left shoulder,     Current Outpatient Prescriptions on File Prior to Visit  Medication Sig Dispense Refill  . aspirin 81 MG tablet Take 81 mg by mouth daily.      . benazepril (LOTENSIN) 40 MG tablet Take 1 tablet (40 mg total) by mouth daily. 90 tablet 2  . cholecalciferol (VITAMIN D) 1000 UNITS tablet Take 1,000 Units by mouth daily.    Marland Kitchen gabapentin (NEURONTIN) 600 MG tablet Take 1 tablet (600 mg total) by mouth 2 (two) times daily. 180 tablet 2  . ketoconazole (NIZORAL) 2 % cream Apply 1 application topically 2 (two) times daily. 60 g 3  . metFORMIN (GLUCOPHAGE) 850 MG tablet Take 1 tablet (850 mg total) by mouth 2 (two) times daily with a meal. 180 tablet 2  . propranolol ER (INDERAL LA) 80 MG 24 hr capsule Take 1 capsule (80 mg total) by mouth daily. 90 capsule 2  . rosuvastatin (CRESTOR) 20 MG tablet Take 1 tablet (20 mg total) by mouth daily. 90 tablet 3  . timolol (TIMOPTIC) 0.5 % ophthalmic solution Place 1 drop into both eyes every morning. As directed    . hydrochlorothiazide (MICROZIDE) 12.5 MG capsule Take 1 capsule (12.5 mg total) by mouth daily. (Patient not taking: Reported on 08/17/2014) 90 capsule 3   No current facility-administered medications on file prior to visit.     Allergies  Allergen Reactions  . Atorvastatin     REACTION: increased cpk  . Celecoxib     REACTION: palpitations= celebrex  . Sulfonamide Derivatives     Not sure reaction  . Zolpidem Tartrate     REACTION: made patient feel weird= ambien    Family History  Problem Relation Age of Onset  . Diabetes Mother   . Tremor Father   . Stroke Neg Hx   . Heart disease Neg Hx   . Cancer Neg Hx     History   Social History  . Marital Status: Married    Spouse Name: N/A  . Number of Children: 3  . Years of Education: N/A   Occupational History  . retired    Social History Main Topics  . Smoking status: Former Smoker -- 0.30 packs/day for 15 years    Types: Cigarettes    Quit date: 01/22/1984  . Smokeless tobacco: Never Used  . Alcohol Use: No     Comment: rare, wine   . Drug Use: No  . Sexual Activity: Not on file   Other Topics Concern  . None   Social History Narrative   8 G-children   Married x 56 years   Review of Systems - See HPI.  All other ROS are negative.  BP 148/64 mmHg  Pulse 64  Temp(Src) 97.9 F (36.6 C) (Oral)  Ht 6\' 2"  (1.88 m)  Wt 227 lb 9.6 oz (103.239 kg)  BMI 29.21 kg/m2  SpO2 98%  Physical Exam  Constitutional: He is oriented to person, place, and time and well-developed, well-nourished, and in no distress.  HENT:  Head: Normocephalic and atraumatic.  Right Ear: External ear normal.  Left Ear: External ear normal.  Nose: Nose normal.  Mouth/Throat: Oropharynx is clear and moist. No oropharyngeal exudate.  TM within normal limits bilaterally.  Eyes: Conjunctivae are normal.  Neck: Neck supple.  Cardiovascular: Normal rate, regular rhythm, normal heart sounds and intact distal pulses.   Pulmonary/Chest: Effort normal and breath sounds normal. No respiratory distress. He has no wheezes. He has no rales. He exhibits no tenderness.  Neurological: He is alert and oriented to person, place, and time.  Skin: Skin is warm and dry. No  rash noted.  Psychiatric: Affect normal.  Vitals reviewed.   Recent Results (from the past 2160 hour(s))  HM DIABETES FOOT EXAM     Status: None   Collection Time: 07/28/14 12:00 AM  Result Value Ref Range   HM Diabetic Foot Exam Done   CBC with Differential/Platelet     Status: None   Collection Time: 07/28/14 11:06 AM  Result Value Ref Range   WBC 6.9 4.0 - 10.5 K/uL   RBC 4.97 4.22 - 5.81 Mil/uL   Hemoglobin 13.9 13.0 - 17.0 g/dL   HCT 42.2 39.0 - 52.0 %   MCV 84.8 78.0 - 100.0 fl   MCHC 32.9 30.0 - 36.0 g/dL   RDW 13.7 11.5 - 15.5 %   Platelets 211.0 150.0 - 400.0 K/uL   Neutrophils Relative % 63.6 43.0 - 77.0 %   Lymphocytes Relative 24.2 12.0 - 46.0 %   Monocytes Relative 7.3 3.0 - 12.0 %   Eosinophils Relative 4.5 0.0 - 5.0 %   Basophils Relative 0.4 0.0 - 3.0 %   Neutro Abs 4.4 1.4 - 7.7 K/uL   Lymphs Abs 1.7 0.7 - 4.0 K/uL   Monocytes Absolute 0.5 0.1 - 1.0 K/uL   Eosinophils Absolute 0.3 0.0 - 0.7 K/uL   Basophils Absolute 0.0 0.0 - 0.1 K/uL  HgB A1c     Status: None   Collection Time: 07/28/14 11:06 AM  Result Value Ref Range   Hgb A1c MFr Bld 6.4 4.6 - 6.5 %    Comment: Glycemic Control Guidelines for People with Diabetes:Non Diabetic:  <6%Goal of Therapy: <7%Additional Action Suggested:  >8%   Folate     Status: None   Collection Time: 07/28/14 11:06 AM  Result Value Ref Range   Folate 20.1 >5.9 ng/mL  CK Total (and CKMB)     Status: Abnormal   Collection Time: 07/28/14 11:06 AM  Result Value Ref Range   Total CK 232 7 - 232 U/L   CK, MB 6.3 (H) 0.0 - 5.0 ng/mL   Relative Index 2.7 0.0 - 4.0    Assessment/Plan: Viral bronchitis Supportive measures reviewed. Rx Tessalon for cough. Plain Mucinex BID. Continue hydration and rest. Tylenol for headache if present. Follow-up if symptoms are not resolving.

## 2014-08-17 NOTE — Progress Notes (Signed)
Pre visit review using our clinic review tool, if applicable. No additional management support is needed unless otherwise documented below in the visit note. 

## 2014-08-17 NOTE — Assessment & Plan Note (Signed)
Supportive measures reviewed. Rx Tessalon for cough. Plain Mucinex BID. Continue hydration and rest. Tylenol for headache if present. Follow-up if symptoms are not resolving.

## 2014-08-31 ENCOUNTER — Ambulatory Visit: Payer: Medicare Other | Admitting: Internal Medicine

## 2014-09-15 ENCOUNTER — Telehealth: Payer: Self-pay

## 2014-09-15 NOTE — Telephone Encounter (Signed)
Spoke with patient and his wife and relayed the information that after reviewing patient's previous colonoscopy, he could forgo a recall colon for now, if he was not having any problems.  Patient stated he preferred to defer it for now and will call the office if he has any problems or questions.

## 2014-09-28 ENCOUNTER — Encounter: Payer: Self-pay | Admitting: Internal Medicine

## 2014-09-28 ENCOUNTER — Ambulatory Visit (INDEPENDENT_AMBULATORY_CARE_PROVIDER_SITE_OTHER): Payer: Medicare Other | Admitting: Internal Medicine

## 2014-09-28 VITALS — BP 130/76 | HR 48 | Temp 97.5°F | Ht 74.0 in | Wt 225.4 lb

## 2014-09-28 DIAGNOSIS — R42 Dizziness and giddiness: Secondary | ICD-10-CM | POA: Diagnosis not present

## 2014-09-28 DIAGNOSIS — I10 Essential (primary) hypertension: Secondary | ICD-10-CM | POA: Diagnosis not present

## 2014-09-28 DIAGNOSIS — Z09 Encounter for follow-up examination after completed treatment for conditions other than malignant neoplasm: Secondary | ICD-10-CM | POA: Insufficient documentation

## 2014-09-28 DIAGNOSIS — Z23 Encounter for immunization: Secondary | ICD-10-CM | POA: Diagnosis not present

## 2014-09-28 LAB — BASIC METABOLIC PANEL
BUN: 25 mg/dL — ABNORMAL HIGH (ref 6–23)
CALCIUM: 10.2 mg/dL (ref 8.4–10.5)
CO2: 29 mEq/L (ref 19–32)
Chloride: 105 mEq/L (ref 96–112)
Creatinine, Ser: 1.13 mg/dL (ref 0.40–1.50)
GFR: 66.65 mL/min (ref >60.00)
Glucose, Bld: 84 mg/dL (ref 70–99)
POTASSIUM: 5.1 meq/L (ref 3.5–5.1)
SODIUM: 138 meq/L (ref 135–145)

## 2014-09-28 NOTE — Progress Notes (Signed)
Pre visit review using our clinic review tool, if applicable. No additional management support is needed unless otherwise documented below in the visit note. 

## 2014-09-28 NOTE — Progress Notes (Signed)
Subjective:    Patient ID: William Faulkner, male    DOB: 02-May-1934, 80 y.o.   MRN: [id number]  DOS:  09/28/2014 Type of visit - description : Follow-up previous visit Interval history: Dizziness: HCTZ was discontinued, and dizziness is essentially gone. Ambulatory BPs remain in the 120s. His pulse is in the 40s today, at home it is consistently in the mid 50s.  Review of Systems  No chest pain or difficulty breathing No nausea, vomiting, diarrhea. No orthostatic weakness or symptoms.  Past Medical History  Diagnosis Date  . Hyperlipidemia   . HYPERTENSION, ESSENTIAL NOS   . CEREBROVASCULAR DISEASE   . CAD, ARTERY BYPASS GRAFT   . AORTIC INSUFFICIENCY   . AORTIC ANEUR UNSPEC SITE WITHOUT MENTION RUPTURE   . DIAB W/O COMP TYPE II/UNS NOT STATED UNCNTRL   . NEOPLASM, MALIGNANT, BLADDER, HX OF   . BPH (benign prostatic hyperplasia)   . Rotator cuff tear     Left shoulder,     Past Surgical History  Procedure Laterality Date  . Rotator cuff repair  2009    right; Dr Shellia Carwin  . Bladder tumor excision      Surgery x6, Dr.Tannebaum  . Colonoscopy  2002/2004    Negative  . Lumbar epidural injection  2008  . Colonoscopy w/ polypectomy      Dr [facility]  . Blepharoplasty  1980    bilaterally, Dr Gershon Crane  . Coronary artery bypass graft  2011    x 5  . Back surgery  2003    lower  . Inguinal hernia repair  10/24/2011    Procedure: HERNIA REPAIR INGUINAL ADULT;  Surgeon: Earnstine Regal, MD;  Location: [facility];  Service: General;  Laterality: Right;  Repair Right Inguinal Hernia with Mesh, Umbilical Hernia Repair with Mesh  . Umbilical hernia repair  10/24/2011    Procedure: HERNIA REPAIR UMBILICAL ADULT;  Surgeon: Earnstine Regal, MD;  Location: [facility];  Service: General;  Laterality: N/A;  . Cystoscopy  09/02/2012  . Shoulder arthroscopy with rotator cuff repair and subacromial decompression  02/14/2014    Right, SAD/DCR, biceps tenodesis    Social History   Social  History  . Marital Status: Married    Spouse Name: N/A  . Number of Children: 3  . Years of Education: N/A   Occupational History  . retired    Social History Main Topics  . Smoking status: Former Smoker -- 0.30 packs/day for 15 years    Types: Cigarettes    Quit date: 01/22/1984  . Smokeless tobacco: Never Used  . Alcohol Use: No     Comment: rare, wine   . Drug Use: No  . Sexual Activity: Not on file   Other Topics Concern  . Not on file   Social History Narrative   8 G-children   Married x 56 years        Medication List       This list is accurate as of: 09/28/14  5:23 PM.  Always use your most recent med list.               aspirin 81 MG tablet  Take 81 mg by mouth daily.     benazepril 40 MG tablet  Commonly known as:  LOTENSIN  Take 1 tablet (40 mg total) by mouth daily.     cholecalciferol 1000 UNITS tablet  Commonly known as:  VITAMIN D  Take 1,000 Units by mouth daily.  gabapentin 600 MG tablet  Commonly known as:  NEURONTIN  Take 1 tablet (600 mg total) by mouth 2 (two) times daily.     ketoconazole 2 % cream  Commonly known as:  NIZORAL  Apply 1 application topically 2 (two) times daily.     metFORMIN 850 MG tablet  Commonly known as:  GLUCOPHAGE  Take 1 tablet (850 mg total) by mouth 2 (two) times daily with a meal.     propranolol ER 80 MG 24 hr capsule  Commonly known as:  INDERAL LA  Take 1 capsule (80 mg total) by mouth daily.     rosuvastatin 20 MG tablet  Commonly known as:  CRESTOR  Take 1 tablet (20 mg total) by mouth daily.     timolol 0.5 % ophthalmic solution  Commonly known as:  TIMOPTIC  Place 1 drop into both eyes every morning. As directed           Objective:   Physical Exam BP 130/76 mmHg  Pulse 48  Temp(Src) 97.5 F (36.4 C) (Oral)  Ht 6\' 2"  (1.88 m)  Wt 225 lb 6 oz (102.229 kg)  BMI 28.92 kg/m2  SpO2 97% General:   Well developed, well nourished . NAD.  HEENT:  Normocephalic . Face symmetric,  atraumatic Lungs:  CTA B Normal respiratory effort, no intercostal retractions, no accessory muscle use. Heart: Bradycardic,  no murmur.  No pretibial edema bilaterally  Skin: Not pale. Not jaundice Neurologic:  alert & oriented X3.  Speech normal, gait appropriate for age and unassisted Psych--  Cognition and judgment appear intact.  Cooperative with normal attention span and concentration.  Behavior appropriate. No anxious or depressed appearing.      Assessment & Plan:   A/P Dizziness: Since the last visit, HCTZ was discontinued and dizziness is resolved. He is bradycardic but no apparent symptoms. Plan: No further evaluation for dizziness  HTN: HCTZ was discontinued, BPs remain very good, check a BMP Primary care: Flu shot today Follow-up in 4-6 months

## 2014-09-28 NOTE — Patient Instructions (Signed)
Get your blood work before you leave     Check the  blood pressure   weekly  Be sure your blood pressure is between 110/65 and  145/85.  if it is consistently higher or lower, let me know   Next visit  for a  routine visit is in 4-6 months (30 minutes)  Please schedule an appointment at the front desk Fasting is optional

## 2014-09-28 NOTE — Assessment & Plan Note (Signed)
Dizziness: Since the last visit, HCTZ was discontinued and dizziness is resolved. He is bradycardic but no apparent symptoms. Plan: No further evaluation for dizziness  HTN: HCTZ was discontinued, BPs remain very good, check a BMP Primary care: Flu shot today Follow-up in 4-6 months

## 2014-10-05 ENCOUNTER — Telehealth: Payer: Self-pay | Admitting: Internal Medicine

## 2014-10-05 NOTE — Telephone Encounter (Signed)
Caller Name:Gambrel,Flora J Relation to pt: spouse  Call back number: [phone]    Reason for call:  Patient inquiring about lab results

## 2014-10-05 NOTE — Telephone Encounter (Signed)
Spoke with William Faulkner, Pt's wife, informed her of Potassium results. Instructed her to have Pt follow potassium food list they received as well as limiting salt substitutes in the diet. Informed William Faulkner we would recheck potassium at next OV. William Faulkner verbalized understanding.

## 2014-10-27 ENCOUNTER — Telehealth: Payer: Self-pay | Admitting: Cardiology

## 2014-10-27 DIAGNOSIS — I712 Thoracic aortic aneurysm, without rupture, unspecified: Secondary | ICD-10-CM

## 2014-10-27 DIAGNOSIS — I679 Cerebrovascular disease, unspecified: Secondary | ICD-10-CM

## 2014-10-27 NOTE — Telephone Encounter (Signed)
CTA scheduled for 11/02/14 @ 10:30 am at the [address] locations. Pt does not need blood work. Carotid dopplers scheduled.

## 2014-10-27 NOTE — Telephone Encounter (Signed)
Spoke with pt wife, pt is due to have a CTA of the chest to f/u thoracic aneurysm. Order placed will call to schedule.

## 2014-10-27 NOTE — Telephone Encounter (Signed)
Pt's wife called in stating that you contacted them yesterday about scheduling his Carotid doppler done but I didn't see that any orders had been placed. Please f/u with her  Thanks

## 2014-11-02 ENCOUNTER — Ambulatory Visit (INDEPENDENT_AMBULATORY_CARE_PROVIDER_SITE_OTHER)
Admission: RE | Admit: 2014-11-02 | Discharge: 2014-11-02 | Disposition: A | Discharge status: Home / Self Care | Payer: Medicare Other | Source: Ambulatory Visit | Attending: Cardiology | Admitting: Cardiology

## 2014-11-02 DIAGNOSIS — I712 Thoracic aortic aneurysm, without rupture, unspecified: Secondary | ICD-10-CM

## 2014-11-02 MED ORDER — IOHEXOL 350 MG/ML SOLN
100.0000 mL | Freq: Once | INTRAVENOUS | Status: AC | PRN
  Administered 2014-11-02: 100 mL via INTRAVENOUS

## 2014-11-21 ENCOUNTER — Ambulatory Visit (HOSPITAL_COMMUNITY)
Admission: RE | Admit: 2014-11-21 | Discharge: 2014-11-21 | Disposition: A | Discharge status: Home / Self Care | Payer: Medicare Other | Source: Ambulatory Visit | Attending: Cardiology | Admitting: Cardiology

## 2014-11-21 DIAGNOSIS — I6523 Occlusion and stenosis of bilateral carotid arteries: Secondary | ICD-10-CM | POA: Diagnosis not present

## 2014-11-21 DIAGNOSIS — E119 Type 2 diabetes mellitus without complications: Secondary | ICD-10-CM | POA: Insufficient documentation

## 2014-11-21 DIAGNOSIS — E785 Hyperlipidemia, unspecified: Secondary | ICD-10-CM | POA: Insufficient documentation

## 2014-11-21 DIAGNOSIS — I10 Essential (primary) hypertension: Secondary | ICD-10-CM | POA: Insufficient documentation

## 2014-11-21 DIAGNOSIS — I679 Cerebrovascular disease, unspecified: Secondary | ICD-10-CM | POA: Diagnosis not present

## 2014-11-23 ENCOUNTER — Other Ambulatory Visit: Payer: Self-pay | Admitting: Cardiology

## 2014-12-14 ENCOUNTER — Encounter: Payer: Self-pay | Admitting: *Deleted

## 2014-12-19 NOTE — Progress Notes (Signed)
HPI: FU CAD, aortic insufficiency, and cerebrovascular disease. A cardiac catheterization in May of 2011 revealed severe three-vessel coronary artery disease and normal LV function. He subsequently underwent coronary artery bypass and graft in May of 2011 (left internal mammary artery to left anterior descending artery, saphenous vein graft to first diagonal, saphenous vein graft obtuse marginal 1, sequential saphenous vein graft to distal right coronary and posterior descending artery). Note an intraoperative transesophageal echocardiogram showed mild to moderate aortic insufficiency and a dilated aortic root. However this was felt not to require surgical intervention at that time. Last echocardiogram in August 2014 showed normal LV function, mild to moderate aortic insufficiency, dilated aorta with ascending measuring 47 mm. Mild biatrial enlargement. He also had carotid Dopplers last performed in Oct 2016. He had a 40-59% right stenosis and fu recommended one year. F/U recommended in 1 year. Last chest CT in October 2016 showed ascending thoracic aortic aneurysm measuring 4.6 x 4.8 cm. Since I last saw him, He denies dyspnea, chest pain, palpitations or syncope.  Current Outpatient Prescriptions  Medication Sig Dispense Refill  . aspirin 81 MG tablet Take 81 mg by mouth daily.      . benazepril (LOTENSIN) 40 MG tablet Take 1 tablet (40 mg total) by mouth daily. 90 tablet 2  . cholecalciferol (VITAMIN D) 1000 UNITS tablet Take 1,000 Units by mouth daily.    Marland Kitchen gabapentin (NEURONTIN) 600 MG tablet Take 1 tablet (600 mg total) by mouth 2 (two) times daily. 180 tablet 2  . ketoconazole (NIZORAL) 2 % cream Apply 1 application topically 2 (two) times daily. 60 g 3  . metFORMIN (GLUCOPHAGE) 850 MG tablet Take 1 tablet (850 mg total) by mouth 2 (two) times daily with a meal. 180 tablet 2  . propranolol ER (INDERAL LA) 80 MG 24 hr capsule Take 1 capsule (80 mg total) by mouth daily. 90 capsule 2  .  rosuvastatin (CRESTOR) 20 MG tablet Take 1 tablet (20 mg total) by mouth daily. 90 tablet 0  . timolol (TIMOPTIC) 0.5 % ophthalmic solution Place 1 drop into both eyes every morning. As directed     No current facility-administered medications for this visit.     Past Medical History  Diagnosis Date  . Hyperlipidemia   . HYPERTENSION, ESSENTIAL NOS   . CEREBROVASCULAR DISEASE   . CAD, ARTERY BYPASS GRAFT   . AORTIC INSUFFICIENCY   . AORTIC ANEUR UNSPEC SITE WITHOUT MENTION RUPTURE   . DIAB W/O COMP TYPE II/UNS NOT STATED UNCNTRL   . NEOPLASM, MALIGNANT, BLADDER, HX OF   . BPH (benign prostatic hyperplasia)   . Rotator cuff tear     Left shoulder,     Past Surgical History  Procedure Laterality Date  . Rotator cuff repair  2009    right; Dr Shellia Carwin  . Bladder tumor excision      Surgery x6, Dr.Tannebaum  . Colonoscopy  2002/2004    Negative  . Lumbar epidural injection  2008  . Colonoscopy w/ polypectomy      Dr Ree [facility]ay [facility]  . Blepharoplasty  1980    bilaterally, Dr Gershon Crane  . Coronary artery bypass graft  2011    x 5  . Back surgery  2003    lower  . Inguinal hernia repair  10/24/2011    Procedure: HERNIA REPAIR INGUINAL ADULT;  Surgeon: Earnstine Regal, MD;  Location: [facility];  Service: General;  Laterality: Right;  Repair Right Inguinal Hernia with Mesh,  Umbilical Hernia Repair with Mesh  . Umbilical hernia repair  10/24/2011    Procedure: HERNIA REPAIR UMBILICAL ADULT;  Surgeon: Earnstine Regal, MD;  Location: [facility];  Service: General;  Laterality: N/A;  . Cystoscopy  09/02/2012  . Shoulder arthroscopy with rotator cuff repair and subacromial decompression  02/14/2014    Right, SAD/DCR, biceps tenodesis    Social History   Social History  . Marital Status: Married    Spouse Name: N/A  . Number of Children: 3  . Years of Education: N/A   Occupational History  . retired    Social History Main Topics  . Smoking status: Former Smoker -- 0.30 packs/day for 15  years    Types: Cigarettes    Quit date: 01/22/1984  . Smokeless tobacco: Never Used  . Alcohol Use: No     Comment: rare, wine   . Drug Use: No  . Sexual Activity: Not on file   Other Topics Concern  . Not on file   Social History Narrative   8 G-children   Married x 56 years    ROS: no fevers or chills, productive cough, hemoptysis, dysphasia, odynophagia, melena, hematochezia, dysuria, hematuria, rash, seizure activity, orthopnea, PND, pedal edema, claudication. Remaining systems are negative.  Physical Exam: Well-developed well-nourished in no acute distress.  Skin is warm and dry.  HEENT is normal.  Neck is supple.  Chest is clear to auscultation with normal expansion.  Cardiovascular exam is regular rate and rhythm. 2/6 systolic and diastolic murmur left sternal border. Abdominal exam nontender or distended. No masses palpated. Extremities show no edema. neuro grossly intact  ECG Sinus rhythm with first-degree AV block. Right bundle branch block.

## 2014-12-21 ENCOUNTER — Ambulatory Visit (INDEPENDENT_AMBULATORY_CARE_PROVIDER_SITE_OTHER): Payer: Medicare Other | Admitting: Cardiology

## 2014-12-21 ENCOUNTER — Encounter: Payer: Self-pay | Admitting: *Deleted

## 2014-12-21 ENCOUNTER — Encounter: Payer: Self-pay | Admitting: Cardiology

## 2014-12-21 VITALS — BP 152/60 | HR 58 | Ht 74.0 in | Wt 224.2 lb

## 2014-12-21 DIAGNOSIS — I359 Nonrheumatic aortic valve disorder, unspecified: Secondary | ICD-10-CM | POA: Diagnosis not present

## 2014-12-21 DIAGNOSIS — I25700 Atherosclerosis of coronary artery bypass graft(s), unspecified, with unstable angina pectoris: Secondary | ICD-10-CM

## 2014-12-21 DIAGNOSIS — I10 Essential (primary) hypertension: Secondary | ICD-10-CM | POA: Diagnosis not present

## 2014-12-21 DIAGNOSIS — I679 Cerebrovascular disease, unspecified: Secondary | ICD-10-CM | POA: Diagnosis not present

## 2014-12-21 DIAGNOSIS — E785 Hyperlipidemia, unspecified: Secondary | ICD-10-CM

## 2014-12-21 MED ORDER — HYDROCHLOROTHIAZIDE 12.5 MG PO CAPS: 12.5000 mg | ORAL_CAPSULE | Freq: Every day | ORAL | Status: DC

## 2014-12-21 NOTE — Assessment & Plan Note (Signed)
Follow-up chest CT October 2017.

## 2014-12-21 NOTE — Assessment & Plan Note (Signed)
Plan repeat echocardiogram to reassess aortic insufficiency.

## 2014-12-21 NOTE — Assessment & Plan Note (Addendum)
Blood pressure Elevated. Add HCTZ 12.5 mg daily. Check potassium and renal function in 1 week.

## 2014-12-21 NOTE — Patient Instructions (Signed)
Medication Instructions:   START HCTZ 12.5 MG ONCE DAILY  Labwork:  Your physician recommends that you return for lab work in: [place]  Testing/Procedures:  Your physician has requested that you have an echocardiogram. Echocardiography is a painless test that uses sound waves to create images of your heart. It provides your doctor with information about the size and shape of your heart and how well your heart's chambers and valves are working. This procedure takes approximately one hour. There are no restrictions for this procedure.    Follow-Up:  Your physician wants you to follow-up in: [place] will receive a reminder letter in the mail two months in advance. If you don't receive a letter, please call our office to schedule the follow-up appointment.   If you need a refill on your cardiac medications before your next appointment, please call your pharmacy.

## 2014-12-21 NOTE — Assessment & Plan Note (Signed)
Continue aspirin and statin. 

## 2014-12-21 NOTE — Assessment & Plan Note (Signed)
Continue statin. 

## 2014-12-21 NOTE — Assessment & Plan Note (Signed)
Follow-up carotid Dopplers October 2017. 

## 2015-01-09 ENCOUNTER — Ambulatory Visit (HOSPITAL_COMMUNITY): Payer: Medicare Other | Attending: Cardiovascular Disease

## 2015-01-09 ENCOUNTER — Other Ambulatory Visit (INDEPENDENT_AMBULATORY_CARE_PROVIDER_SITE_OTHER): Payer: Medicare Other | Admitting: *Deleted

## 2015-01-09 ENCOUNTER — Other Ambulatory Visit: Payer: Self-pay

## 2015-01-09 DIAGNOSIS — I10 Essential (primary) hypertension: Secondary | ICD-10-CM

## 2015-01-09 DIAGNOSIS — I517 Cardiomegaly: Secondary | ICD-10-CM | POA: Insufficient documentation

## 2015-01-09 DIAGNOSIS — I371 Nonrheumatic pulmonary valve insufficiency: Secondary | ICD-10-CM | POA: Insufficient documentation

## 2015-01-09 DIAGNOSIS — I340 Nonrheumatic mitral (valve) insufficiency: Secondary | ICD-10-CM | POA: Insufficient documentation

## 2015-01-09 DIAGNOSIS — I77810 Thoracic aortic ectasia: Secondary | ICD-10-CM | POA: Insufficient documentation

## 2015-01-09 DIAGNOSIS — Z951 Presence of aortocoronary bypass graft: Secondary | ICD-10-CM | POA: Diagnosis not present

## 2015-01-09 DIAGNOSIS — I351 Nonrheumatic aortic (valve) insufficiency: Secondary | ICD-10-CM | POA: Diagnosis not present

## 2015-01-09 DIAGNOSIS — I359 Nonrheumatic aortic valve disorder, unspecified: Secondary | ICD-10-CM

## 2015-01-09 LAB — BASIC METABOLIC PANEL
BUN: 27 mg/dL — AB (ref 7–25)
CO2: 28 mmol/L (ref 20–31)
Calcium: 9.6 mg/dL (ref 8.6–10.3)
Chloride: 103 mmol/L (ref 98–110)
Creat: 1.1 mg/dL (ref 0.70–1.18)
GLUCOSE: 74 mg/dL (ref 65–99)
POTASSIUM: 4.7 mmol/L (ref 3.5–5.3)
SODIUM: 139 mmol/L (ref 135–146)

## 2015-03-02 ENCOUNTER — Other Ambulatory Visit: Payer: Self-pay | Admitting: Internal Medicine

## 2015-03-02 ENCOUNTER — Other Ambulatory Visit: Payer: Self-pay | Admitting: Cardiology

## 2015-03-02 NOTE — Telephone Encounter (Signed)
REFILL 

## 2015-03-22 ENCOUNTER — Ambulatory Visit: Payer: Medicare Other | Admitting: Internal Medicine

## 2015-03-27 ENCOUNTER — Ambulatory Visit: Payer: Medicare Other | Admitting: Internal Medicine

## 2015-05-09 ENCOUNTER — Other Ambulatory Visit: Payer: Self-pay | Admitting: Internal Medicine

## 2015-06-21 LAB — PSA: PSA: 2.64

## 2015-07-16 ENCOUNTER — Other Ambulatory Visit: Payer: Self-pay | Admitting: Internal Medicine

## 2015-07-17 ENCOUNTER — Telehealth: Payer: Self-pay | Admitting: Internal Medicine

## 2015-07-18 MED ORDER — METFORMIN HCL 850 MG PO TABS: 850.0000 mg | ORAL_TABLET | Freq: Two times a day (BID) | ORAL | Status: DC

## 2015-07-18 NOTE — Telephone Encounter (Signed)
Pt's spouse Dianah Field called in to inform PCP that theyre mail order carrier doesn't have pt's metFORMIN in stock. Pt would like to know if Rx could be sent to theyre local pharmacy instead?     Pharmacy : [facility] on [address].

## 2015-07-18 NOTE — Telephone Encounter (Signed)
Rx sent to [facility] on [address]. Pt overdue for routine f/u, please schedule at Pt's convenience. Thank you.

## 2015-08-05 ENCOUNTER — Telehealth: Payer: Self-pay

## 2015-08-05 NOTE — Telephone Encounter (Signed)
Patient is on the list for [facility] 2017 and may be a good candidate for an AWV in 2017. Please let me know if/when appt is scheduled.   

## 2015-08-09 NOTE — Telephone Encounter (Signed)
Pt has been scheduled.  °

## 2015-08-11 NOTE — Telephone Encounter (Signed)
William Faulkner (spouse) will consult with patient and call back to schedule.

## 2015-08-23 ENCOUNTER — Telehealth: Payer: Self-pay

## 2015-08-23 ENCOUNTER — Other Ambulatory Visit: Payer: Self-pay | Admitting: Internal Medicine

## 2015-08-23 ENCOUNTER — Encounter: Payer: Self-pay | Admitting: Internal Medicine

## 2015-08-23 ENCOUNTER — Ambulatory Visit (INDEPENDENT_AMBULATORY_CARE_PROVIDER_SITE_OTHER): Payer: Medicare Other | Admitting: Internal Medicine

## 2015-08-23 VITALS — BP 126/80 | HR 56 | Temp 98.0°F | Resp 14 | Ht 74.0 in | Wt 222.2 lb

## 2015-08-23 DIAGNOSIS — E785 Hyperlipidemia, unspecified: Secondary | ICD-10-CM

## 2015-08-23 DIAGNOSIS — M25561 Pain in right knee: Secondary | ICD-10-CM | POA: Diagnosis not present

## 2015-08-23 DIAGNOSIS — N400 Enlarged prostate without lower urinary tract symptoms: Secondary | ICD-10-CM

## 2015-08-23 DIAGNOSIS — R21 Rash and other nonspecific skin eruption: Secondary | ICD-10-CM

## 2015-08-23 DIAGNOSIS — Z8551 Personal history of malignant neoplasm of bladder: Secondary | ICD-10-CM

## 2015-08-23 DIAGNOSIS — M79661 Pain in right lower leg: Secondary | ICD-10-CM

## 2015-08-23 DIAGNOSIS — M25562 Pain in left knee: Secondary | ICD-10-CM | POA: Diagnosis not present

## 2015-08-23 DIAGNOSIS — E118 Type 2 diabetes mellitus with unspecified complications: Secondary | ICD-10-CM | POA: Diagnosis not present

## 2015-08-23 DIAGNOSIS — M79662 Pain in left lower leg: Principal | ICD-10-CM

## 2015-08-23 DIAGNOSIS — I10 Essential (primary) hypertension: Secondary | ICD-10-CM

## 2015-08-23 LAB — LIPID PANEL
CHOLESTEROL: 115 mg/dL (ref 0–200)
HDL: 42 mg/dL (ref >39.00)
LDL CALC: 43 mg/dL (ref 0–99)
NonHDL: 72.99
Total CHOL/HDL Ratio: 3
Triglycerides: 149 mg/dL (ref 0.0–149.0)
VLDL: 29.8 mg/dL (ref 0.0–40.0)

## 2015-08-23 LAB — COMPREHENSIVE METABOLIC PANEL
ALBUMIN: 4.2 g/dL (ref 3.5–5.2)
ALT: 14 U/L (ref 0–53)
AST: 20 U/L (ref 0–37)
Alkaline Phosphatase: 39 U/L (ref 39–117)
BUN: 28 mg/dL — AB (ref 6–23)
CHLORIDE: 104 meq/L (ref 96–112)
CO2: 27 mEq/L (ref 19–32)
Calcium: 9.8 mg/dL (ref 8.4–10.5)
Creatinine, Ser: 1.23 mg/dL (ref 0.40–1.50)
GFR: 60.3 mL/min (ref >60.00)
Glucose, Bld: 105 mg/dL — ABNORMAL HIGH (ref 70–99)
POTASSIUM: 4.9 meq/L (ref 3.5–5.1)
SODIUM: 139 meq/L (ref 135–145)
Total Bilirubin: 0.6 mg/dL (ref 0.2–1.2)
Total Protein: 7.1 g/dL (ref 6.0–8.3)

## 2015-08-23 LAB — FOLATE: Folate: 21.5 ng/mL (ref >5.9)

## 2015-08-23 LAB — CBC WITH DIFFERENTIAL/PLATELET
BASOS PCT: 0.3 % (ref 0.0–3.0)
Basophils Absolute: 0 10*3/uL (ref 0.0–0.1)
EOS ABS: 0.3 10*3/uL (ref 0.0–0.7)
Eosinophils Relative: 3.8 % (ref 0.0–5.0)
HCT: 42 % (ref 39.0–52.0)
HEMOGLOBIN: 13.6 g/dL (ref 13.0–17.0)
LYMPHS ABS: 1.9 10*3/uL (ref 0.7–4.0)
Lymphocytes Relative: 27 % (ref 12.0–46.0)
MCHC: 32.4 g/dL (ref 30.0–36.0)
MCV: 85.2 fl (ref 78.0–100.0)
MONO ABS: 0.6 10*3/uL (ref 0.1–1.0)
Monocytes Relative: 8.3 % (ref 3.0–12.0)
NEUTROS ABS: 4.3 10*3/uL (ref 1.4–7.7)
NEUTROS PCT: 60.6 % (ref 43.0–77.0)
PLATELETS: 197 10*3/uL (ref 150.0–400.0)
RBC: 4.93 Mil/uL (ref 4.22–5.81)
RDW: 13.7 % (ref 11.5–15.5)
WBC: 7.1 10*3/uL (ref 4.0–10.5)

## 2015-08-23 LAB — VITAMIN B12: VITAMIN B 12: 260 pg/mL (ref 211–911)

## 2015-08-23 LAB — CK: Total CK: 290 U/L — ABNORMAL HIGH (ref 7–232)

## 2015-08-23 LAB — HEMOGLOBIN A1C: HEMOGLOBIN A1C: 6.4 % (ref 4.6–6.5)

## 2015-08-23 LAB — SEDIMENTATION RATE: Sed Rate: 27 mm/hr — ABNORMAL HIGH (ref 0–20)

## 2015-08-23 MED ORDER — GABAPENTIN 600 MG PO TABS: 600.0000 mg | ORAL_TABLET | Freq: Three times a day (TID) | ORAL | 6 refills | Status: DC

## 2015-08-23 MED ORDER — KETOCONAZOLE 2 % EX CREA: 1.0000 "application " | TOPICAL_CREAM | Freq: Two times a day (BID) | CUTANEOUS | 3 refills | Status: DC

## 2015-08-23 NOTE — Telephone Encounter (Signed)
Reviewed

## 2015-08-23 NOTE — Patient Instructions (Signed)
GO TO THE LAB : Get the blood work     GO TO THE FRONT DESK Schedule your next appointment for a  Check up in 3 weeks   DO NOT CRESTOR FOR 3 WEEKS Increase gabapentin to 3 times a day Use Nizoral twice a day 100 feet every day

## 2015-08-23 NOTE — Progress Notes (Signed)
Pre visit review using our clinic review tool, if applicable. No additional management support is needed unless otherwise documented below in the visit note. 

## 2015-08-23 NOTE — Assessment & Plan Note (Signed)
DM with neuropathy: Continue benazepril, metformin, increase gabapentin bid --> TID. Check a CMP, A1c. HTN: Cont Lotensin, HCTZ,BB. checking a CMP Hyperlipidemia: On Crestor 20 mg, check FLP. See below Deep ache in the legs: DDX vascular disease, neurological claudication, neuropathy, statin side effects, others. Plan: Hold Crestor for 3 weeks. ABIs. Sedimentation rate, [id number], folic acid, RPR, total CKs. Reassess in 3 weeks:   Neuro-ortho  referral? NCS?  Rash feet: Nizoral twice a day BPH, bladder cancer: Last visit documented with urology 2014, follow-up for a final cystoscopy was 2016, patient reports was recently seen. Will get records.Addendum: Note reviewed, had a essentially normal cystoscopy 05/2015, they recommend to return in 2 years another cystoscopy. RTC 3 weeks. Emphasized the need to follow-up

## 2015-08-23 NOTE — Telephone Encounter (Signed)
Spoke w/ Medical Records at [facility], last OV 05/2015. Requested records be faxed to [phone].

## 2015-08-23 NOTE — Progress Notes (Signed)
Subjective:    Patient ID: William Faulkner, male    DOB: 1934/12/10, 81 y.o.   MRN: [id number]  DOS:  08/23/2015 Type of visit - description : rov, here w/ wife, has several concerns Interval history: For the last few months, low back pain has been worse after playing golf,  no radiation. Also several months history of "deep ache" from the knees down bilaterally. Pain is constant, not necessarily worse when he walks ; he actually states it feels  slightly better with ambulation. No aches at the upper torso.  He also has neuropathy --->  ache at the feet, that remains unchanged HTN: Good med compliance, ambulatory BPs always less than 125 DM: CBGs always less than 120.  Review of Systems  No chest pain or difficulty breathing No bladder or bowel incontinence Lower extremity edema at baseline Reports on and off, chronic rash on the feet. No itching.  Past Medical History:  Diagnosis Date  . AORTIC ANEUR UNSPEC SITE WITHOUT MENTION RUPTURE   . AORTIC INSUFFICIENCY   . BPH (benign prostatic hyperplasia)   . CAD, ARTERY BYPASS GRAFT   . CEREBROVASCULAR DISEASE   . DIAB W/O COMP TYPE II/UNS NOT STATED UNCNTRL   . Hyperlipidemia   . HYPERTENSION, ESSENTIAL NOS   . NEOPLASM, MALIGNANT, BLADDER, HX OF   . Rotator cuff tear    Left shoulder,     Past Surgical History:  Procedure Laterality Date  . BACK SURGERY  2003   lower  . BLADDER TUMOR EXCISION     Surgery x6, Dr.Tannebaum  . BLEPHAROPLASTY  1980   bilaterally, Dr Gershon Crane  . COLONOSCOPY W/ POLYPECTOMY     Dr Ree Shay [facility]  . CORONARY ARTERY BYPASS GRAFT  2011   x 5  . CYSTOSCOPY  09/02/2012  . INGUINAL HERNIA REPAIR  10/24/2011   Procedure: HERNIA REPAIR INGUINAL ADULT;  Surgeon: Earnstine Regal, MD;  Location: [facility];  Service: General;  Laterality: Right;  Repair Right Inguinal Hernia with Mesh, Umbilical Hernia Repair with Mesh  . LUMBAR EPIDURAL INJECTION  2008  . ROTATOR CUFF REPAIR  2009   right; Dr Shellia Carwin    . SHOULDER ARTHROSCOPY WITH ROTATOR CUFF REPAIR AND SUBACROMIAL DECOMPRESSION  02/14/2014   Right, SAD/DCR, biceps tenodesis  . UMBILICAL HERNIA REPAIR  10/24/2011   Procedure: HERNIA REPAIR UMBILICAL ADULT;  Surgeon: Earnstine Regal, MD;  Location: [facility];  Service: General;  Laterality: N/A;    Social History   Social History  . Marital status: Married    Spouse name: N/A  . Number of children: 3  . Years of education: N/A   Occupational History  . retired    Social History Main Topics  . Smoking status: Former Smoker    Packs/day: 0.30    Years: 15.00    Types: Cigarettes    Quit date: 01/22/1984  . Smokeless tobacco: Never Used  . Alcohol use No     Comment: rare, wine   . Drug use: No  . Sexual activity: Not on file   Other Topics Concern  . Not on file   Social History Narrative   8 G-children   Married x 56 years        Medication List       Accurate as of 08/23/15  1:41 PM. Always use your most recent med list.          aspirin 81 MG tablet Take 81 mg by mouth daily.  benazepril 40 MG tablet Commonly known as:  LOTENSIN Take 1 tablet (40 mg total) by mouth daily.   cholecalciferol 1000 units tablet Commonly known as:  VITAMIN D Take 1,000 Units by mouth daily.   gabapentin 600 MG tablet Commonly known as:  NEURONTIN Take 1 tablet (600 mg total) by mouth 3 (three) times daily.   hydrochlorothiazide 12.5 MG capsule Commonly known as:  MICROZIDE Take 1 capsule (12.5 mg total) by mouth daily.   ketoconazole 2 % cream Commonly known as:  NIZORAL Apply 1 application topically 2 (two) times daily.   metFORMIN 850 MG tablet Commonly known as:  GLUCOPHAGE Take 1 tablet (850 mg total) by mouth 2 (two) times daily with a meal.   propranolol ER 80 MG 24 hr capsule Commonly known as:  INDERAL LA Take 1 capsule (80 mg total) by mouth daily.   rosuvastatin 20 MG tablet Commonly known as:  CRESTOR TAKE 1 TABLET DAILY   timolol 0.5 % ophthalmic  solution Commonly known as:  TIMOPTIC Place 1 drop into both eyes every morning. As directed          Objective:   Physical Exam BP 126/80 (BP Location: Left Arm, Patient Position: Sitting, Cuff Size: Normal)   Pulse (!) 56   Temp 98 F (36.7 C) (Oral)   Resp 14   Ht 6\' 2"  (1.88 m)   Wt 222 lb 4 oz (100.8 kg)   SpO2 97%   BMI 28.54 kg/m  General:   Well developed, well nourished . NAD.  HEENT:  Normocephalic . Face symmetric, atraumatic Lungs:  CTA B Normal respiratory effort, no intercostal retractions, no accessory muscle use. Heart: RRR,  + diastolic, soft murmur.  Trace pretibial edema bilaterally  Lower extremities: Normal femur pulses, present but slightly decreased pedal pulses. Abdomen:  Not distended, soft, non-tender. No rebound or rigidity.  Skin: Slightly erythematous scaly rash in a moccasin distribution bilaterally Neurologic:  alert & oriented X3.  Speech normal, gait appropriate for age and unassisted DTRs symmetrically decreased upper extremities, absent lower extremities. Strength symmetric. Psych--  Cognition and judgment appear intact.  Cooperative with normal attention span and concentration.  Behavior appropriate. No anxious or depressed appearing.    Assessment & Plan:   Assessment Diabetes, neuropathy HTN Hyperlipidemia CV: CAD, CABG Aortic insufficiency, dilated aortic root Carotid artery disease 40-59% 10-2014, follow-up 1 year Ascending aortic aneurysm, last CT chest 10-2014 GU: BPH, bladder cancer Thyroid nodules: Small per [place]  03-2014 Cholelithiasis by CT 01-2014 MSK: h/o back surgery 2003  Plan: DM with neuropathy: Continue benazepril, metformin, increase gabapentin bid --> TID. Check a CMP, A1c. HTN: Cont Lotensin, HCTZ,BB. checking a CMP Hyperlipidemia: On Crestor 20 mg, check FLP. See below Deep ache in the legs: DDX vascular disease, neurological claudication, neuropathy, statin side effects, others. Plan: Hold Crestor  for 3 weeks. ABIs. Sedimentation rate, 123456, folic acid, RPR, total CKs. Reassess in 3 weeks:   Neuro-ortho  referral? NCS?  Rash feet: Nizoral twice a day BPH, bladder cancer: Last visit documented with urology 2014, follow-up for a final cystoscopy was 2016, patient reports was recently seen. Will get records.Addendum: Note reviewed, had a essentially normal cystoscopy 05/2015, they recommend to return in 2 years another cystoscopy. RTC 3 weeks. Emphasized the need to follow-up  Today, I spent more than  45  min with the patient: >50% of the time counseling regards not only his chronic medical problems but regards severe leg pain that required extensive workup. Multiple  questions answered to the best of my ability. History taking was also very lengthy due to detailed description of his symptoms.

## 2015-08-23 NOTE — Telephone Encounter (Signed)
OV note from 06/21/2015 received, placed in PCP red folder for review.

## 2015-08-24 ENCOUNTER — Other Ambulatory Visit: Payer: Self-pay

## 2015-08-24 LAB — RPR

## 2015-08-24 MED ORDER — BENAZEPRIL HCL 40 MG PO TABS: 40.0000 mg | ORAL_TABLET | Freq: Every day | ORAL | 2 refills | Status: DC

## 2015-09-01 ENCOUNTER — Ambulatory Visit (HOSPITAL_COMMUNITY)
Admission: RE | Admit: 2015-09-01 | Discharge: 2015-09-01 | Disposition: A | Discharge status: Home / Self Care | Payer: Medicare Other | Source: Ambulatory Visit | Attending: Cardiovascular Disease | Admitting: Cardiovascular Disease

## 2015-09-01 DIAGNOSIS — M25562 Pain in left knee: Secondary | ICD-10-CM

## 2015-09-01 DIAGNOSIS — M25561 Pain in right knee: Secondary | ICD-10-CM | POA: Diagnosis not present

## 2015-09-01 DIAGNOSIS — E119 Type 2 diabetes mellitus without complications: Secondary | ICD-10-CM | POA: Diagnosis not present

## 2015-09-01 DIAGNOSIS — M79662 Pain in left lower leg: Secondary | ICD-10-CM

## 2015-09-01 DIAGNOSIS — I10 Essential (primary) hypertension: Secondary | ICD-10-CM | POA: Diagnosis not present

## 2015-09-01 DIAGNOSIS — M79661 Pain in right lower leg: Secondary | ICD-10-CM

## 2015-09-01 DIAGNOSIS — E785 Hyperlipidemia, unspecified: Secondary | ICD-10-CM | POA: Diagnosis not present

## 2015-09-13 ENCOUNTER — Ambulatory Visit (INDEPENDENT_AMBULATORY_CARE_PROVIDER_SITE_OTHER): Payer: Medicare Other | Admitting: Internal Medicine

## 2015-09-13 ENCOUNTER — Encounter: Payer: Self-pay | Admitting: Internal Medicine

## 2015-09-13 VITALS — BP 122/54 | HR 52 | Temp 97.6°F | Resp 17 | Ht 74.0 in | Wt 223.4 lb

## 2015-09-13 DIAGNOSIS — Z09 Encounter for follow-up examination after completed treatment for conditions other than malignant neoplasm: Secondary | ICD-10-CM | POA: Diagnosis not present

## 2015-09-13 DIAGNOSIS — M791 Myalgia, unspecified site: Secondary | ICD-10-CM

## 2015-09-13 DIAGNOSIS — E785 Hyperlipidemia, unspecified: Secondary | ICD-10-CM

## 2015-09-13 LAB — CK: CK TOTAL: 165 U/L (ref 7–232)

## 2015-09-13 NOTE — Patient Instructions (Addendum)
GO TO THE LAB : Get the blood work     GO TO THE FRONT DESK Schedule your next appointment for a  Check up in 4 months    William Faulkner

## 2015-09-13 NOTE — Progress Notes (Signed)
Subjective:    Patient ID: William Faulkner, male    DOB: 01/26/1934, 81 y.o.   MRN: [id number]  DOS:  09/13/2015 Type of visit - description : Follow-up Interval history: Since the last visit, Crestor was discontinued, leg pain did not change. Previous labs reviewed w/ patient .   Review of Systems   Past Medical History:  Diagnosis Date  . AORTIC ANEUR UNSPEC SITE WITHOUT MENTION RUPTURE   . AORTIC INSUFFICIENCY   . BPH (benign prostatic hyperplasia)   . CAD, ARTERY BYPASS GRAFT   . CEREBROVASCULAR DISEASE   . DIAB W/O COMP TYPE II/UNS NOT STATED UNCNTRL   . Hyperlipidemia   . HYPERTENSION, ESSENTIAL NOS   . NEOPLASM, MALIGNANT, BLADDER, HX OF   . Rotator cuff tear    Left shoulder,     Past Surgical History:  Procedure Laterality Date  . BACK SURGERY  2003   lower  . BLADDER TUMOR EXCISION     Surgery x6, Dr.Tannebaum  . BLEPHAROPLASTY  1980   bilaterally, Dr Gershon Crane  . COLONOSCOPY W/ POLYPECTOMY     Dr Ree Shay [facility]  . CORONARY ARTERY BYPASS GRAFT  2011   x 5  . CYSTOSCOPY  09/02/2012  . INGUINAL HERNIA REPAIR  10/24/2011   Procedure: HERNIA REPAIR INGUINAL ADULT;  Surgeon: Earnstine Regal, MD;  Location: [facility];  Service: General;  Laterality: Right;  Repair Right Inguinal Hernia with Mesh, Umbilical Hernia Repair with Mesh  . LUMBAR EPIDURAL INJECTION  2008  . ROTATOR CUFF REPAIR  2009   right; Dr Shellia Carwin  . SHOULDER ARTHROSCOPY WITH ROTATOR CUFF REPAIR AND SUBACROMIAL DECOMPRESSION  02/14/2014   Right, SAD/DCR, biceps tenodesis  . UMBILICAL HERNIA REPAIR  10/24/2011   Procedure: HERNIA REPAIR UMBILICAL ADULT;  Surgeon: Earnstine Regal, MD;  Location: [facility];  Service: General;  Laterality: N/A;    Social History   Social History  . Marital status: Married    Spouse name: N/A  . Number of children: 3  . Years of education: N/A   Occupational History  . retired    Social History Main Topics  . Smoking status: Former Smoker    Packs/day: 0.30   Years: 15.00    Types: Cigarettes    Quit date: 01/22/1984  . Smokeless tobacco: Never Used  . Alcohol use No     Comment: rare, wine   . Drug use: No  . Sexual activity: Not on file   Other Topics Concern  . Not on file   Social History Narrative   8 G-children   Married x 56 years        Medication List       Accurate as of 09/13/15  5:26 PM. Always use your most recent med list.          aspirin 81 MG tablet Take 81 mg by mouth daily.   benazepril 40 MG tablet Commonly known as:  LOTENSIN Take 1 tablet (40 mg total) by mouth daily.   cholecalciferol 1000 units tablet Commonly known as:  VITAMIN D Take 1,000 Units by mouth daily.   gabapentin 600 MG tablet Commonly known as:  NEURONTIN Take 1 tablet (600 mg total) by mouth 3 (three) times daily.   hydrochlorothiazide 12.5 MG capsule Commonly known as:  MICROZIDE Take 1 capsule (12.5 mg total) by mouth daily.   ketoconazole 2 % cream Commonly known as:  NIZORAL Apply 1 application topically 2 (two) times daily.   metFORMIN 850  MG tablet Commonly known as:  GLUCOPHAGE Take 1 tablet (850 mg total) by mouth 2 (two) times daily with a meal.   propranolol ER 80 MG 24 hr capsule Commonly known as:  INDERAL LA Take 1 capsule (80 mg total) by mouth daily.   rosuvastatin 20 MG tablet Commonly known as:  CRESTOR TAKE 1 TABLET DAILY   timolol 0.5 % ophthalmic solution Commonly known as:  TIMOPTIC Place 1 drop into both eyes every morning. As directed          Objective:   Physical Exam BP (!) 122/54 (BP Location: Left Arm, Patient Position: Sitting, Cuff Size: Normal)   Pulse (!) 52   Temp 97.6 F (36.4 C) (Oral)   Resp 17   Ht 6\' 2"  (1.88 m)   Wt 223 lb 6.4 oz (101.3 kg)   SpO2 98%   BMI 28.68 kg/m  General:   Well developed, well nourished . NAD.  HEENT:  Normocephalic . Face symmetric, atraumatic Skin: Not pale. Not jaundice Neurologic:  alert & oriented X3.  Speech normal, gait  appropriate for age and unassisted Psych--  Cognition and judgment appear intact.  Cooperative with normal attention span and concentration.  Behavior appropriate. No anxious or depressed appearing.      Assessment & Plan:   Assessment Diabetes, neuropathy HTN Hyperlipidemia CV: CAD, CABG Aortic insufficiency, dilated aortic root Carotid artery disease 40-59% 10-2014, follow-up 1 year Ascending aortic aneurysm, last CT chest 10-2014 GU: BPH, bladder cancer Thyroid nodules: Small per [place]  03-2014 Cholelithiasis by CT 01-2014 MSK: h/o back surgery 2003  PLAN:  Deep ache in the legs: Labs -ABIs unremarkable except for a CK of 290;  Increased in gabapentin did not help although he has not been taking it religiously. He continued having B leg deep aches, not worse at night, sometimes decrease with walking, not burning type of pain. Patient would like to evaluate his symptoms further. Refer to neurology: NCS? Back MRI? Hyperlipidemia: Okay to go back on Crestor, recheck a CK which was a slightly elevated RTC 4 months

## 2015-09-13 NOTE — Assessment & Plan Note (Signed)
Deep ache in the legs: Labs -ABIs unremarkable except for a CK of 290;  Increased in gabapentin did not help although he has not been taking it religiously. He continued having B leg deep aches, not worse at night, sometimes decrease with walking, not burning type of pain. Patient would like to evaluate his symptoms further. Refer to neurology: NCS? Back MRI? Hyperlipidemia: Okay to go back on Crestor, recheck a CK which was a slightly elevated RTC 4 months

## 2015-09-13 NOTE — Progress Notes (Signed)
Pre visit review using our clinic review tool, if applicable. No additional management support is needed unless otherwise documented below in the visit note. 

## 2015-09-15 ENCOUNTER — Other Ambulatory Visit: Payer: Self-pay | Admitting: *Deleted

## 2015-09-15 DIAGNOSIS — M791 Myalgia, unspecified site: Secondary | ICD-10-CM

## 2015-09-19 ENCOUNTER — Ambulatory Visit (INDEPENDENT_AMBULATORY_CARE_PROVIDER_SITE_OTHER): Payer: Medicare Other | Admitting: Neurology

## 2015-09-19 DIAGNOSIS — G629 Polyneuropathy, unspecified: Secondary | ICD-10-CM

## 2015-09-19 DIAGNOSIS — M791 Myalgia, unspecified site: Secondary | ICD-10-CM

## 2015-09-19 DIAGNOSIS — M5417 Radiculopathy, lumbosacral region: Secondary | ICD-10-CM

## 2015-09-19 NOTE — Procedures (Signed)
[facility]  [address], [address]  [place], [place] [address] Tel: [phone] Fax:  [phone] Test Date:  09/19/2015  Patient: William Faulkner DOB: September 22, 1934 Physician: Narda Amber, DO  Sex: Male Height: 6\' 2"  Ref Phys: Kathlene November, M.D.  ID#: [id number] Temp: 34.5C Technician: Jerilynn Mages. Dean   Patient Complaints: This is a 81 year old gentleman referred for evaluation of worsening low back pain and bilateral leg numbness.  NCV & EMG Findings: Extensive electrodiagnostic testing of the right lower extremity and additional studies of the left shows: 1. Bilateral sural and superficial peroneal sensory responses are absent. 2. Bilateral peroneal motor responses at the extensor digitorum brevis Are barely present, however, motor responses at the tibialis anterior are within normal limits. Left tibial motor response also shows markedly reduced amplitude. The right tibial motor response is absent. 3. Right tibial H reflex study is absent. Left tibial H reflex study is prolonged. 4. Needle electrode examination shows chronic motor axon loss changes affecting the L5 myotomes bilaterally and the right L3-L4 myotome, which was previously present his EMG dated July, 12th, 2017. There is no evidence of accompanied active denervation.    Impression: 1. The electrophysiologic findings are most consistent with a generalized sensorimotor polyneuropathy, axon loss in type, affecting the lower extremities. Overall, these findings are severe in degree electrically.  When compared to his electrodiagnostic testing dated July 12th, 2017 his neuropathy appears stable, but there is new lumbosacral radiculopathy as noted below. 2. Chronic L5 radiculopathy affecting bilateral lower extremities; moderate in degree electrically. 3. Chronic and L3-L4 radiculopathy affecting the right lower extremity, mild in degree electrically.      ___________________________ Narda Amber, DO    Nerve  Conduction Studies Anti Sensory Summary Table   Site NR Peak (ms) Norm Peak (ms) P-T Amp (V) Norm P-T Amp  Left Sup Peroneal Anti Sensory (Ant Lat Mall)  12 cm NR  <4.6  >3  Right Sup Peroneal Anti Sensory (Ant Lat Mall)  32.3C  12 cm NR  <4.6  >3  Left Sural Anti Sensory (Lat Mall)  Calf NR  <4.6  >3  Right Sural Anti Sensory (Lat Mall)  32.3C  Calf NR  <4.6  >3   Motor Summary Table   Site NR Onset (ms) Norm Onset (ms) O-P Amp (mV) Norm O-P Amp Site1 Site2 Delta-0 (ms) Dist (cm) Vel (m/s) Norm Vel (m/s)  Left Peroneal Motor (Ext Dig Brev)  Ankle    4.6 <6.0 0.2 >2.5 B Fib Ankle 10.0 33.0 33 >40  B Fib    14.6  0.1  Poplt B Fib 2.6 10.0 38 >40  Poplt    17.2  0.1         Right Peroneal Motor (Ext Dig Brev)  32.3C  Ankle    4.8 <6.0 0.1 >2.5 B Fib Ankle 9.0 34.0 38 >40  B Fib    13.8  0.1  Poplt B Fib 2.6 10.0 38 >40  Poplt    16.4  0.1         Left Peroneal TA Motor (Tib Ant)  Fib Head    3.2 <4.5 3.2 >3 Poplit Fib Head 2.3 10.0 43 >40  Poplit    5.5  3.1         Right Peroneal TA Motor (Tib Ant)  32.3C  Fib Head    3.4 <4.5 3.5 >3 Poplit Fib Head 2.0 10.0 50 >40  Poplit    5.4  3.2  Left Tibial Motor (Abd Hall Brev)  Ankle    5.9 <6.0 0.2 >4 Knee Ankle 10.3 40.0 39 >40  Knee    16.2  0.1         Right Tibial Motor (Abd Hall Brev)  32.3C  Ankle NR  <6.0  >4 Knee Ankle  0.0  >40  Knee NR             H Reflex Studies   NR H-Lat (ms) Lat Norm (ms) L-R H-Lat (ms)  Left Tibial (Gastroc)     44.49 <35   Right Tibial (Gastroc)  32.3C  NR  <35    EMG   Side Muscle Ins Act Fibs Psw Fasc Number Recrt Dur Dur. Amp Amp. Poly Poly. Comment  Right AntTibialis Nml Nml Nml Nml 2- Rapid Many 1+ Many 1+ Some 1+ N/A  Right Gastroc Nml Nml Nml Nml Nml Nml Nml Nml Nml Nml Nml Nml N/A  Right Flex Dig Long Nml Nml Nml Nml 2- Rapid Many 1+ Many 1+ Many 1+ N/A  Right RectFemoris Nml Nml Nml Nml 1- Rapid Some 1+ Some 1+ Some 1+ N/A  Right GluteusMed Nml Nml Nml Nml 1- Rapid  Few 1+ Few 1+ Nml Nml N/A  Right AdductorLong Nml Nml Nml Nml 1- Rapid Some 1+ Some 1+ Some 1+ N/A  Left Gastroc Nml Nml Nml Nml Nml Nml Nml Nml Nml Nml Nml Nml N/A  Left Flex Dig Long Nml Nml Nml Nml 2- Rapid Many 1+ Many 1+ Some 1+ N/A  Left RectFemoris Nml Nml Nml Nml Nml Nml Nml Nml Nml Nml Nml Nml N/A  Left GluteusMed Nml Nml Nml Nml 1- Mod-R Few 1+ Few 1+ Nml Nml N/A  Left AntTibialis Nml Nml Nml Nml 2- Rapid Many 1+ Many 1+ Some 1+ N/A      Waveforms:

## 2015-09-27 ENCOUNTER — Telehealth: Payer: Self-pay | Admitting: Internal Medicine

## 2015-09-27 NOTE — Telephone Encounter (Signed)
LMOM informing Pt of results and recommendations. Instructed Pt to call in 3 weeks to let us know how he is doing and/or if he has any questions or concerns.

## 2015-09-27 NOTE — Telephone Encounter (Signed)
Advise patient, nerve conduction study show neuropathy, recommend to take gabapentin 3 times a day as prescribed consistently, patient to call in 3 weeks on the me know how she's doing. If he is not better alternatives would be to switch to Lyrica, Cymbalta or refer to neurology.

## 2015-10-10 ENCOUNTER — Other Ambulatory Visit: Payer: Self-pay | Admitting: Internal Medicine

## 2015-10-23 ENCOUNTER — Ambulatory Visit (INDEPENDENT_AMBULATORY_CARE_PROVIDER_SITE_OTHER): Payer: Medicare Other

## 2015-10-23 DIAGNOSIS — Z23 Encounter for immunization: Secondary | ICD-10-CM | POA: Diagnosis not present

## 2015-10-30 ENCOUNTER — Telehealth: Payer: Self-pay | Admitting: *Deleted

## 2015-10-30 DIAGNOSIS — I712 Thoracic aortic aneurysm, without rupture, unspecified: Secondary | ICD-10-CM

## 2015-10-30 NOTE — Telephone Encounter (Signed)
Spoke with pt wife, pt needs to have a CTA of the chest to follow up on thoracic aneurysm. He will get a bmp on Friday 11-03-15 at the [facility] office. CTA will be Monday 11-06-15 @ 3:30 pm at the [address] office. Orders placed for bmp, instructions discussed with pt. Follow up scheduled with dr [place] Breed

## 2015-11-01 LAB — BASIC METABOLIC PANEL
BUN: 22 mg/dL (ref 7–25)
CHLORIDE: 103 mmol/L (ref 98–110)
CO2: 25 mmol/L (ref 20–31)
Calcium: 9.5 mg/dL (ref 8.6–10.3)
Creat: 1.09 mg/dL (ref 0.70–1.18)
GLUCOSE: 78 mg/dL (ref 65–99)
Potassium: 4.7 mmol/L (ref 3.5–5.3)
SODIUM: 138 mmol/L (ref 135–146)

## 2015-11-06 ENCOUNTER — Encounter (INDEPENDENT_AMBULATORY_CARE_PROVIDER_SITE_OTHER): Payer: Self-pay

## 2015-11-06 ENCOUNTER — Ambulatory Visit (INDEPENDENT_AMBULATORY_CARE_PROVIDER_SITE_OTHER)
Admission: RE | Admit: 2015-11-06 | Discharge: 2015-11-06 | Disposition: A | Discharge status: Home / Self Care | Payer: Medicare Other | Source: Ambulatory Visit | Attending: Cardiology | Admitting: Cardiology

## 2015-11-06 DIAGNOSIS — I712 Thoracic aortic aneurysm, without rupture, unspecified: Secondary | ICD-10-CM

## 2015-11-06 MED ORDER — IOPAMIDOL (ISOVUE-370) INJECTION 76%
100.0000 mL | Freq: Once | INTRAVENOUS | Status: AC | PRN
  Administered 2015-11-06: 100 mL via INTRAVENOUS

## 2015-11-07 ENCOUNTER — Other Ambulatory Visit: Payer: Self-pay | Admitting: *Deleted

## 2015-11-07 DIAGNOSIS — I679 Cerebrovascular disease, unspecified: Secondary | ICD-10-CM

## 2015-11-20 ENCOUNTER — Ambulatory Visit: Payer: Medicare Other | Admitting: Neurology

## 2015-11-29 ENCOUNTER — Ambulatory Visit (HOSPITAL_COMMUNITY)
Admission: RE | Admit: 2015-11-29 | Discharge: 2015-11-29 | Disposition: A | Discharge status: Home / Self Care | Payer: Medicare Other | Source: Ambulatory Visit | Attending: Cardiovascular Disease | Admitting: Cardiovascular Disease

## 2015-11-29 DIAGNOSIS — E119 Type 2 diabetes mellitus without complications: Secondary | ICD-10-CM | POA: Insufficient documentation

## 2015-11-29 DIAGNOSIS — F17200 Nicotine dependence, unspecified, uncomplicated: Secondary | ICD-10-CM | POA: Insufficient documentation

## 2015-11-29 DIAGNOSIS — E785 Hyperlipidemia, unspecified: Secondary | ICD-10-CM | POA: Insufficient documentation

## 2015-11-29 DIAGNOSIS — I6523 Occlusion and stenosis of bilateral carotid arteries: Secondary | ICD-10-CM | POA: Insufficient documentation

## 2015-11-29 DIAGNOSIS — I2510 Atherosclerotic heart disease of native coronary artery without angina pectoris: Secondary | ICD-10-CM | POA: Insufficient documentation

## 2015-11-29 DIAGNOSIS — I679 Cerebrovascular disease, unspecified: Secondary | ICD-10-CM | POA: Diagnosis present

## 2015-11-29 DIAGNOSIS — I10 Essential (primary) hypertension: Secondary | ICD-10-CM | POA: Insufficient documentation

## 2015-12-17 ENCOUNTER — Other Ambulatory Visit: Payer: Self-pay | Admitting: Internal Medicine

## 2015-12-18 ENCOUNTER — Encounter: Payer: Self-pay | Admitting: Cardiology

## 2015-12-19 ENCOUNTER — Other Ambulatory Visit: Payer: Self-pay

## 2015-12-19 MED ORDER — GABAPENTIN 600 MG PO TABS: 600.0000 mg | ORAL_TABLET | Freq: Three times a day (TID) | ORAL | 1 refills | Status: DC

## 2015-12-20 NOTE — Progress Notes (Signed)
HPI: FU CAD, aortic insufficiency, and cerebrovascular disease. A cardiac catheterization in May of 2011 revealed severe three-vessel coronary artery disease and normal LV function. He subsequently underwent coronary artery bypass and graft in May of 2011 (left internal mammary artery to left anterior descending artery, saphenous vein graft to first diagonal, saphenous vein graft obtuse marginal 1, sequential saphenous vein graft to distal right coronary and posterior descending artery). Note an intraoperative transesophageal echocardiogram showed mild to moderate aortic insufficiency and a dilated aortic root. However this was felt not to require surgical intervention at that time. Echocardiogram December 2016 showed vigorous LV systolic function, grade 1 diastolic dysfunction, moderate aortic insufficiency, moderately dilated ascending aorta, mild mitral regurgitation. ABIs August 2017 normal. CTA October 2017 showed aneurysmal dilatation of ascending aorta at 4.7 x 4.4 cm. Carotid Dopplers November 2017 showed 1-39% bilateral stenosis. Since I last saw him, the patient has dyspnea with more extreme activities but not with routine activities. It is relieved with rest. It is not associated with chest pain. There is no orthopnea, PND or pedal edema. There is no syncope or palpitations. There is no exertional chest pain.   Current Outpatient Prescriptions  Medication Sig Dispense Refill  . aspirin 81 MG tablet Take 81 mg by mouth daily.      . benazepril (LOTENSIN) 40 MG tablet Take 1 tablet (40 mg total) by mouth daily. 90 tablet 2  . cholecalciferol (VITAMIN D) 1000 UNITS tablet Take 1,000 Units by mouth daily.    Marland Kitchen gabapentin (NEURONTIN) 600 MG tablet Take 1 tablet (600 mg total) by mouth 3 (three) times daily. 270 tablet 1  . hydrochlorothiazide (MICROZIDE) 12.5 MG capsule Take 1 capsule (12.5 mg total) by mouth daily. 90 capsule 3  . ketoconazole (NIZORAL) 2 % cream Apply 1 application  topically 2 (two) times daily. 60 g 3  . metFORMIN (GLUCOPHAGE) 850 MG tablet Take 1 tablet (850 mg total) by mouth 2 (two) times daily with a meal. 180 tablet 0  . propranolol ER (INDERAL LA) 80 MG 24 hr capsule Take 1 capsule (80 mg total) by mouth daily. 90 capsule 2  . rosuvastatin (CRESTOR) 20 MG tablet TAKE 1 TABLET DAILY 90 tablet 3  . timolol (TIMOPTIC) 0.5 % ophthalmic solution Place 1 drop into both eyes every morning. As directed     No current facility-administered medications for this visit.      Past Medical History:  Diagnosis Date  . AORTIC ANEUR UNSPEC SITE WITHOUT MENTION RUPTURE   . AORTIC INSUFFICIENCY   . BPH (benign prostatic hyperplasia)   . CAD, ARTERY BYPASS GRAFT   . CEREBROVASCULAR DISEASE   . DIAB W/O COMP TYPE II/UNS NOT STATED UNCNTRL   . Hyperlipidemia   . HYPERTENSION, ESSENTIAL NOS   . NEOPLASM, MALIGNANT, BLADDER, HX OF   . Rotator cuff tear    Left shoulder,     Past Surgical History:  Procedure Laterality Date  . BACK SURGERY  2003   lower  . BLADDER TUMOR EXCISION     Surgery x6, Dr.Tannebaum  . BLEPHAROPLASTY  1980   bilaterally, Dr Gershon Crane  . COLONOSCOPY W/ POLYPECTOMY     Dr Ree Shay [facility]  . CORONARY ARTERY BYPASS GRAFT  2011   x 5  . CYSTOSCOPY  09/02/2012  . INGUINAL HERNIA REPAIR  10/24/2011   Procedure: HERNIA REPAIR INGUINAL ADULT;  Surgeon: Earnstine Regal, MD;  Location: [facility];  Service: General;  Laterality: Right;  Repair Right  Inguinal Hernia with Mesh, Umbilical Hernia Repair with Mesh  . LUMBAR EPIDURAL INJECTION  2008  . ROTATOR CUFF REPAIR  2009   right; Dr Shellia Carwin  . SHOULDER ARTHROSCOPY WITH ROTATOR CUFF REPAIR AND SUBACROMIAL DECOMPRESSION  02/14/2014   Right, SAD/DCR, biceps tenodesis  . UMBILICAL HERNIA REPAIR  10/24/2011   Procedure: HERNIA REPAIR UMBILICAL ADULT;  Surgeon: Earnstine Regal, MD;  Location: [facility];  Service: General;  Laterality: N/A;    Social History   Social History  . Marital status:  Married    Spouse name: N/A  . Number of children: 3  . Years of education: N/A   Occupational History  . retired    Social History Main Topics  . Smoking status: Former Smoker    Packs/day: 0.30    Years: 15.00    Types: Cigarettes    Quit date: 01/22/1984  . Smokeless tobacco: Never Used  . Alcohol use No     Comment: rare, wine   . Drug use: No  . Sexual activity: Not on file   Other Topics Concern  . Not on file   Social History Narrative   8 G-children   Married x 56 years    Family History  Problem Relation Age of Onset  . Diabetes Mother   . Tremor Father   . Stroke Neg Hx   . Heart disease Neg Hx   . Cancer Neg Hx     ROS: Low back pain but no fevers or chills, productive cough, hemoptysis, dysphasia, odynophagia, melena, hematochezia, dysuria, hematuria, rash, seizure activity, orthopnea, PND, pedal edema, claudication. Remaining systems are negative.  Physical Exam: Well-developed well-nourished in no acute distress.  Skin is warm and dry.  HEENT is normal.  Neck is supple.  Chest is clear to auscultation with normal expansion.  Cardiovascular exam is regular rate and rhythm. 2/6 DM LSB Abdominal exam nontender or distended. No masses palpated. Positive bruit Extremities show no edema. neuro grossly intact  ECG-Sinus rhythm with first-degree AV block. Left axis deviation. Right bundle branch block.  A/P  1 Thoracic aortic aneurysm-follow-up chest CT October 2018.  2 aortic insufficiency -schedule follow-up echocardiogram.  3 coronary artery disease-continue aspirin and statin.  4 carotid artery disease-continue aspirin and statin.  5 hypertension-blood pressure elevated. However he follows this and it is typically controlled. Continue present medications and follow.  6 hyperlipidemia-continue statin.  7 bruit-Abd ultrasound to exclude aneurysm  Kirk Ruths, MD

## 2015-12-25 ENCOUNTER — Encounter: Payer: Self-pay | Admitting: Cardiology

## 2015-12-25 ENCOUNTER — Ambulatory Visit (INDEPENDENT_AMBULATORY_CARE_PROVIDER_SITE_OTHER): Payer: Medicare Other | Admitting: Cardiology

## 2015-12-25 VITALS — BP 156/58 | HR 58 | Ht 74.0 in | Wt 222.0 lb

## 2015-12-25 DIAGNOSIS — I712 Thoracic aortic aneurysm, without rupture, unspecified: Secondary | ICD-10-CM

## 2015-12-25 DIAGNOSIS — R0989 Other specified symptoms and signs involving the circulatory and respiratory systems: Secondary | ICD-10-CM | POA: Diagnosis not present

## 2015-12-25 DIAGNOSIS — I25700 Atherosclerosis of coronary artery bypass graft(s), unspecified, with unstable angina pectoris: Secondary | ICD-10-CM

## 2015-12-25 DIAGNOSIS — I10 Essential (primary) hypertension: Secondary | ICD-10-CM

## 2015-12-25 DIAGNOSIS — I359 Nonrheumatic aortic valve disorder, unspecified: Secondary | ICD-10-CM | POA: Diagnosis not present

## 2015-12-25 NOTE — Patient Instructions (Signed)
Medication Instructions:   NO CHANGE  Testing/Procedures:  Your physician has requested that you have an echocardiogram. Echocardiography is a painless test that uses sound waves to create images of your heart. It provides your doctor with information about the size and shape of your heart and how well your heart's chambers and valves are working. This procedure takes approximately one hour. There are no restrictions for this procedure.   Your physician has requested that you have an abdominal aorta duplex. During this test, an ultrasound is used to evaluate the aorta. Allow 30 minutes for this exam. Do not eat after midnight the day before and avoid carbonated beverages   Follow-Up:  Your physician wants you to follow-up in: ONE YEAR WITH DR CRENSHAW You will receive a reminder letter in the mail two months in advance. If you don't receive a letter, please call our office to schedule the follow-up appointment.   If you need a refill on your cardiac medications before your next appointment, please call your pharmacy.  

## 2016-01-04 ENCOUNTER — Telehealth: Payer: Self-pay | Admitting: *Deleted

## 2016-01-04 NOTE — Telephone Encounter (Signed)
Pt requests to be called in January to schedule.

## 2016-01-10 ENCOUNTER — Encounter: Payer: Self-pay | Admitting: Internal Medicine

## 2016-01-10 ENCOUNTER — Ambulatory Visit (INDEPENDENT_AMBULATORY_CARE_PROVIDER_SITE_OTHER): Payer: Medicare Other | Admitting: Internal Medicine

## 2016-01-10 VITALS — BP 122/74 | HR 54 | Temp 97.7°F | Resp 14 | Ht 74.0 in | Wt 226.0 lb

## 2016-01-10 DIAGNOSIS — G629 Polyneuropathy, unspecified: Secondary | ICD-10-CM | POA: Diagnosis not present

## 2016-01-10 DIAGNOSIS — Z23 Encounter for immunization: Secondary | ICD-10-CM

## 2016-01-10 MED ORDER — PREGABALIN 75 MG PO CAPS: 75.0000 mg | ORAL_CAPSULE | Freq: Two times a day (BID) | ORAL | 6 refills | Status: DC

## 2016-01-10 NOTE — Patient Instructions (Addendum)
Stop gabapentin  Start Lyrica 75 mg: 1 tablet daily for one week, then one tablet twice a day.  Next visit in 3-4 months, complete physical exam, fasting. Please schedule a visit at the front desk

## 2016-01-10 NOTE — Progress Notes (Signed)
Subjective:    Patient ID: William Faulkner, male    DOB: [birth year], 81 y.o.   MRN: [id number]  DOS:  01/10/2016 Type of visit - description : f/u Interval history: Since the last visit, he had a nerve conduction study, gabapentin  dose increased to 3 times a day, did not help the neuropathy and actually make him tired.  Had a carotid ultrasound, results reviewed DM: CBGs are excellent between 80 and 90 most days Ambulatory BP is very good. Also, reports a ill-defined right eye discomfort for a week, on and off. Vision  remains normal, no conjunctival redness, no photophobia, no tearing.   Review of Systems  See above  Past Medical History:  Diagnosis Date  . AORTIC ANEUR UNSPEC SITE WITHOUT MENTION RUPTURE   . AORTIC INSUFFICIENCY   . BPH (benign prostatic hyperplasia)   . CAD, ARTERY BYPASS GRAFT   . CEREBROVASCULAR DISEASE   . DIAB W/O COMP TYPE II/UNS NOT STATED UNCNTRL   . Hyperlipidemia   . HYPERTENSION, ESSENTIAL NOS   . NEOPLASM, MALIGNANT, BLADDER, HX OF   . Rotator cuff tear    Left shoulder,     Past Surgical History:  Procedure Laterality Date  . BACK SURGERY  2003   lower  . BLADDER TUMOR EXCISION     Surgery x6, Dr.Tannebaum  . BLEPHAROPLASTY  1980   bilaterally, Dr Gershon Crane  . COLONOSCOPY W/ POLYPECTOMY     Dr Ree Shay [facility]  . CORONARY ARTERY BYPASS GRAFT  2011   x 5  . CYSTOSCOPY  09/02/2012  . INGUINAL HERNIA REPAIR  10/24/2011   Procedure: HERNIA REPAIR INGUINAL ADULT;  Surgeon: Earnstine Regal, MD;  Location: [facility];  Service: General;  Laterality: Right;  Repair Right Inguinal Hernia with Mesh, Umbilical Hernia Repair with Mesh  . LUMBAR EPIDURAL INJECTION  2008  . ROTATOR CUFF REPAIR  2009   right; Dr Shellia Carwin  . SHOULDER ARTHROSCOPY WITH ROTATOR CUFF REPAIR AND SUBACROMIAL DECOMPRESSION  02/14/2014   Right, SAD/DCR, biceps tenodesis  . UMBILICAL HERNIA REPAIR  10/24/2011   Procedure: HERNIA REPAIR UMBILICAL ADULT;  Surgeon: Earnstine Regal,  MD;  Location: [facility];  Service: General;  Laterality: N/A;    Social History   Social History  . Marital status: Married    Spouse name: N/A  . Number of children: 3  . Years of education: N/A   Occupational History  . retired    Social History Main Topics  . Smoking status: Former Smoker    Packs/day: 0.30    Years: 15.00    Types: Cigarettes    Quit date: 01/22/1984  . Smokeless tobacco: Never Used  . Alcohol use No     Comment: rare, wine   . Drug use: No  . Sexual activity: Not on file   Other Topics Concern  . Not on file   Social History Narrative   8 G-children   Married x 56 years      Allergies as of 01/10/2016      Reactions   Atorvastatin    REACTION: increased cpk   Celecoxib    REACTION: palpitations= celebrex   Sulfonamide Derivatives    Not sure reaction   Zolpidem Tartrate    REACTION: made patient feel weird= ambien      Medication List       Accurate as of 01/10/16  5:07 PM. Always use your most recent med list.  aspirin 81 MG tablet Take 81 mg by mouth daily.   benazepril 40 MG tablet Commonly known as:  LOTENSIN Take 1 tablet (40 mg total) by mouth daily.   cholecalciferol 1000 units tablet Commonly known as:  VITAMIN D Take 1,000 Units by mouth daily.   hydrochlorothiazide 12.5 MG capsule Commonly known as:  MICROZIDE Take 1 capsule (12.5 mg total) by mouth daily.   ketoconazole 2 % cream Commonly known as:  NIZORAL Apply 1 application topically 2 (two) times daily.   metFORMIN 850 MG tablet Commonly known as:  GLUCOPHAGE Take 1 tablet (850 mg total) by mouth 2 (two) times daily with a meal.   pregabalin 75 MG capsule Commonly known as:  LYRICA Take 1 capsule (75 mg total) by mouth 2 (two) times daily.   propranolol ER 80 MG 24 hr capsule Commonly known as:  INDERAL LA Take 1 capsule (80 mg total) by mouth daily.   rosuvastatin 20 MG tablet Commonly known as:  CRESTOR TAKE 1 TABLET DAILY   timolol  0.5 % ophthalmic solution Commonly known as:  TIMOPTIC Place 1 drop into both eyes every morning. As directed          Objective:   Physical Exam BP 122/74 (BP Location: Right Arm, Patient Position: Sitting, Cuff Size: Normal)   Pulse (!) 54   Temp 97.7 F (36.5 C) (Oral)   Resp 14   Ht 6\' 2"  (1.88 m)   Wt 226 lb (102.5 kg)   SpO2 98%   BMI 29.02 kg/m  General:   Well developed, well nourished . NAD.  HEENT:  Normocephalic . Face symmetric, atraumatic. EOMI, pupils equal and reactive, no conjunctival redness or discharge. Anterior chamfers normal. No photophobia. Lungs:  CTA B Normal respiratory effort, no intercostal retractions, no accessory muscle use. Heart: RRR,  no murmur.  No pretibial edema bilaterally  Skin: Not pale. Not jaundice Neurologic:  alert & oriented X3.  Speech normal, gait appropriate for age and unassisted Psych--  Cognition and judgment appear intact.  Cooperative with normal attention span and concentration.  Behavior appropriate. No anxious or depressed appearing.      Assessment & Plan:   Assessment Diabetes, neuropathy HTN Hyperlipidemia CV: -CAD, CABG -Aortic insufficiency, dilated aortic root -Carotid artery disease 40-59% 10-2014, [place] 11-2015 : 1-39%, next per cards  -Ascending aortic aneurysm, last CT chest 10-2015 GU: BPH, bladder cancer Thyroid nodules: Small per [place]  03-2014 Cholelithiasis by CT 01-2014 MSK: h/o back surgery 2003 Preglaucoma   PLAN: Deep ache in the legs: Since the last time he was here, did not see neurology but had NCS : + for  neuropathy, increase gabapentin to TID cause tiredness and did not help the problem. He's back on statins and symptoms are not worse. Discussed possibly referral to neurology, trial with Lyrica. Elected trial with Lyrica. Eye  discomfort, eye exam normal, rec to discuss with his eye doctor. Has history of pre-glaucoma.  Primary care: Pneumonia shot today RTC   3-4 months, CPX

## 2016-01-10 NOTE — Assessment & Plan Note (Signed)
Deep ache in the legs: Since the last time he was here, did not see neurology but had NCS : + for  neuropathy, increase gabapentin to TID cause tiredness and did not help the problem. He's back on statins and symptoms are not worse. Discussed possibly referral to neurology, trial with Lyrica. Elected trial with Lyrica. Eye  discomfort, eye exam normal, rec to discuss with his eye doctor. Has history of pre-glaucoma.  Primary care: Pneumonia shot today RTC   3-4 months, CPX

## 2016-01-10 NOTE — Progress Notes (Signed)
Pre visit review using our clinic review tool, if applicable. No additional management support is needed unless otherwise documented below in the visit note. 

## 2016-01-18 ENCOUNTER — Other Ambulatory Visit: Payer: Self-pay

## 2016-01-18 ENCOUNTER — Ambulatory Visit (HOSPITAL_COMMUNITY): Payer: Medicare Other | Attending: Cardiovascular Disease

## 2016-01-18 DIAGNOSIS — E119 Type 2 diabetes mellitus without complications: Secondary | ICD-10-CM | POA: Diagnosis not present

## 2016-01-18 DIAGNOSIS — E785 Hyperlipidemia, unspecified: Secondary | ICD-10-CM | POA: Insufficient documentation

## 2016-01-18 DIAGNOSIS — I10 Essential (primary) hypertension: Secondary | ICD-10-CM | POA: Diagnosis not present

## 2016-01-18 DIAGNOSIS — R600 Localized edema: Secondary | ICD-10-CM | POA: Diagnosis not present

## 2016-01-18 DIAGNOSIS — Z951 Presence of aortocoronary bypass graft: Secondary | ICD-10-CM | POA: Insufficient documentation

## 2016-01-18 DIAGNOSIS — I2510 Atherosclerotic heart disease of native coronary artery without angina pectoris: Secondary | ICD-10-CM | POA: Insufficient documentation

## 2016-01-18 DIAGNOSIS — Z87891 Personal history of nicotine dependence: Secondary | ICD-10-CM | POA: Diagnosis not present

## 2016-01-18 DIAGNOSIS — I719 Aortic aneurysm of unspecified site, without rupture: Secondary | ICD-10-CM | POA: Insufficient documentation

## 2016-01-18 DIAGNOSIS — I359 Nonrheumatic aortic valve disorder, unspecified: Secondary | ICD-10-CM | POA: Insufficient documentation

## 2016-01-19 ENCOUNTER — Ambulatory Visit (HOSPITAL_COMMUNITY)
Admission: RE | Admit: 2016-01-19 | Discharge: 2016-01-19 | Disposition: A | Discharge status: Home / Self Care | Payer: Medicare Other | Source: Ambulatory Visit | Attending: Cardiology | Admitting: Cardiology

## 2016-01-19 DIAGNOSIS — I10 Essential (primary) hypertension: Secondary | ICD-10-CM | POA: Insufficient documentation

## 2016-01-19 DIAGNOSIS — R0989 Other specified symptoms and signs involving the circulatory and respiratory systems: Secondary | ICD-10-CM | POA: Insufficient documentation

## 2016-01-19 DIAGNOSIS — Z87891 Personal history of nicotine dependence: Secondary | ICD-10-CM | POA: Diagnosis not present

## 2016-01-19 DIAGNOSIS — Z136 Encounter for screening for cardiovascular disorders: Secondary | ICD-10-CM | POA: Diagnosis not present

## 2016-01-19 DIAGNOSIS — E785 Hyperlipidemia, unspecified: Secondary | ICD-10-CM | POA: Insufficient documentation

## 2016-01-19 DIAGNOSIS — I700 Atherosclerosis of aorta: Secondary | ICD-10-CM | POA: Insufficient documentation

## 2016-01-19 DIAGNOSIS — Z859 Personal history of malignant neoplasm, unspecified: Secondary | ICD-10-CM | POA: Diagnosis not present

## 2016-01-19 DIAGNOSIS — I2510 Atherosclerotic heart disease of native coronary artery without angina pectoris: Secondary | ICD-10-CM | POA: Insufficient documentation

## 2016-01-19 DIAGNOSIS — E119 Type 2 diabetes mellitus without complications: Secondary | ICD-10-CM | POA: Insufficient documentation

## 2016-01-19 DIAGNOSIS — I708 Atherosclerosis of other arteries: Secondary | ICD-10-CM | POA: Insufficient documentation

## 2016-02-02 ENCOUNTER — Telehealth: Payer: Self-pay | Admitting: Internal Medicine

## 2016-02-02 DIAGNOSIS — G629 Polyneuropathy, unspecified: Secondary | ICD-10-CM

## 2016-02-02 NOTE — Telephone Encounter (Signed)
Michaela Corner to place referral?

## 2016-02-02 NOTE — Telephone Encounter (Signed)
Lyrica d/c. Gabapentin 600 mg 1 tab TID added to med list. Neuro referral placed.

## 2016-02-02 NOTE — Telephone Encounter (Signed)
Okay, stay on gabapentin at the previous dose (please edit the medication list) Arrange a neurology referral -- DX neuropathy

## 2016-02-02 NOTE — Telephone Encounter (Signed)
Relation to pt: spouse / Guthrie,Flora J Call back [phone]   Reason for call:  Spouse states pregabalin (LYRICA) 75 MG capsule is causing foot pain and described the feeling as if patient is walking on rocks.  Patient d/c medication and started taking gabapentin and medication works. Spouse wanted to inform PCP, and requesting referral to specialist for  Neuropathy. Please advise

## 2016-02-09 ENCOUNTER — Ambulatory Visit: Payer: Medicare Other | Admitting: Neurology

## 2016-02-14 ENCOUNTER — Ambulatory Visit (HOSPITAL_BASED_OUTPATIENT_CLINIC_OR_DEPARTMENT_OTHER)
Admission: RE | Admit: 2016-02-14 | Discharge: 2016-02-14 | Disposition: A | Discharge status: Home / Self Care | Payer: Medicare Other | Source: Ambulatory Visit | Attending: Family Medicine | Admitting: Family Medicine

## 2016-02-14 ENCOUNTER — Telehealth: Payer: Self-pay | Admitting: Internal Medicine

## 2016-02-14 ENCOUNTER — Ambulatory Visit (INDEPENDENT_AMBULATORY_CARE_PROVIDER_SITE_OTHER): Payer: Medicare Other | Admitting: Family Medicine

## 2016-02-14 VITALS — BP 122/58 | HR 55 | Temp 98.0°F | Wt 224.2 lb

## 2016-02-14 DIAGNOSIS — J9811 Atelectasis: Secondary | ICD-10-CM | POA: Diagnosis not present

## 2016-02-14 DIAGNOSIS — R0602 Shortness of breath: Secondary | ICD-10-CM | POA: Diagnosis present

## 2016-02-14 MED ORDER — ALBUTEROL SULFATE HFA 108 (90 BASE) MCG/ACT IN AERS: INHALATION_SPRAY | RESPIRATORY_TRACT | 0 refills | Status: DC

## 2016-02-14 NOTE — Patient Instructions (Addendum)
Take your inhaler 10-15 minutes prior to going out into cold weather.  If your chest X-ray is normal, we will not be contacting you. If it is abnormal, we will reach out to you.  If things are not improving, follow up with our office, Dr. Larose Kells or me.

## 2016-02-14 NOTE — Progress Notes (Signed)
Chief Complaint  Patient presents with  . Chest Pain    See [facility] note-pain in his lungs when he goes out in to the cold. Denies fever, congestion, SOB.    Angelina Ok Lanuza here for lung complaint. He is here with his wife.  Duration: 1 week  Associated symptoms: shortness of breath and chest tightness  The patient feels burning and shortness of breath and his lungs when he has been going outside in cold air. Denies: rigors, fever, sinus headache, sinus congestion, sinus pain, rhinorrhea, itchy watery eyes, ear pain, ear drainage, sore throat, wheezing and myalgia  No history of asthma or COPD, he is a former smoker who quit in 1986. Treatment to date: None Sick contacts: No  ROS:  Const: Denies fevers HEENT: As noted in HPI Lungs: +intermittent SOB  Past Medical History:  Diagnosis Date  . AORTIC ANEUR UNSPEC SITE WITHOUT MENTION RUPTURE   . AORTIC INSUFFICIENCY   . BPH (benign prostatic hyperplasia)   . CAD, ARTERY BYPASS GRAFT   . CEREBROVASCULAR DISEASE   . DIAB W/O COMP TYPE II/UNS NOT STATED UNCNTRL   . Hyperlipidemia   . HYPERTENSION, ESSENTIAL NOS   . NEOPLASM, MALIGNANT, BLADDER, HX OF   . Rotator cuff tear    Left shoulder,    Family History  Problem Relation Age of Onset  . Diabetes Mother   . Tremor Father   . Stroke Neg Hx   . Heart disease Neg Hx   . Cancer Neg Hx     BP (!) 122/58 (BP Location: Right Arm, Patient Position: Sitting, Cuff Size: Normal)   Pulse (!) 55   Temp 98 F (36.7 C) (Oral)   Wt 224 lb 3.2 oz (101.7 kg)   SpO2 98%   BMI 28.79 kg/m  General: Awake, alert, appears stated age HEENT: AT, [place], ears patent b/l and TM's neg, nares patent w/o discharge, pharynx pink and without exudates, MMM Neck: No masses or asymmetry Heart: RRR, no murmurs, no bruits Lungs: CTAB, no accessory muscle use Psych: Age appropriate judgment and insight, normal mood and affect  SOB (shortness of breath) - Plan: DG Chest 2 View, albuterol  (PROVENTIL HFA;VENTOLIN HFA) 108 (90 Base) MCG/ACT inhaler  Hx not consistent with infectious etiology. CXR unofficially shows no acute changes- less penetrance than prior CXR with no changes noted. Await final read. Informed patient to assume CXR is normal unless he hears from [place].  Will trial albuterol 10-15 min prior to going into cold air to see if this helps.  F/u prn for this issue. He also brought up back pain, to which I recommended scheduling a dedicated visit with Dr. Larose Kells to discuss this. Pt voiced understanding and agreement to the plan.  Palmer, DO 02/14/16 5:08 PM

## 2016-02-14 NOTE — Progress Notes (Signed)
Pre visit review using our clinic review tool, if applicable. No additional management support is needed unless otherwise documented below in the visit note. 

## 2016-02-14 NOTE — Telephone Encounter (Signed)
Pt has an appt scheduled with Dr. Nani Ravens today (02/14/16) at 3:30 pm.

## 2016-02-14 NOTE — Telephone Encounter (Signed)
Patient Name: William Faulkner  DOB: [birth year]    Initial Comment Caller states that her husband is having chest pain   Nurse Assessment  Nurse: Julien Girt, RN, Almyra Free Date/Time Eilene Ghazi Time): 02/14/2016 10:50:19 AM  Confirm and document reason for call. If symptomatic, describe symptoms. ---Caller states her husband is having pain in his lungs when he goes out in to the cold. He just came in from the outside and feels the pain in his right side "lung". I spoke to the pt., he has has had these sx for 3 weeks. He adamantly denies chest pain or difficulty breathing, it is "just a noticeable reaction to the cold". When he warms up the discomfort resolves. No cough or cold sx.  Does the patient have any new or worsening symptoms? ---Yes  Will a triage be completed? ---Yes  Related visit to physician within the last 2 weeks? ---No  Does the PT have any chronic conditions? (i.e. diabetes, asthma, etc.) ---Yes  List chronic conditions. ---Htn, Diabetes, High cholesterol, CABG 6/7 yrs ago  Is this a behavioral health or substance abuse call? ---No     Guidelines    Guideline Title Affirmed Question Affirmed Notes  Chest Pain [1] Chest pain lasting <= 5 minutes AND [2] NO chest pain or cardiac symptoms now (Exceptions: pains lasting a few seconds)    Final Disposition User   See Physician within [place], RN, Almyra Free    Referrals  REFERRED TO PCP OFFICE   Disagree/Comply: Leta Baptist

## 2016-02-22 ENCOUNTER — Telehealth: Payer: Self-pay | Admitting: Internal Medicine

## 2016-02-22 NOTE — Telephone Encounter (Signed)
Caller name:Bulthuis,Flora J Relation to [id number]  Call back [phone]    Reason for call:  Spouse would like to know why albuterol (PROVENTIL HFA;VENTOLIN HFA) 108 (90 Base) MCG/ACT inhaler was sent to [facility], patient picked up Rx from [place], [place], therefore received mail order yesterday and was charge twice, please advise

## 2016-02-26 ENCOUNTER — Encounter: Payer: Self-pay | Admitting: Internal Medicine

## 2016-02-26 ENCOUNTER — Ambulatory Visit (INDEPENDENT_AMBULATORY_CARE_PROVIDER_SITE_OTHER): Payer: Medicare Other | Admitting: Internal Medicine

## 2016-02-26 ENCOUNTER — Other Ambulatory Visit: Payer: Self-pay | Admitting: Internal Medicine

## 2016-02-26 VITALS — BP 132/78 | HR 58 | Temp 97.5°F | Resp 14 | Ht 74.0 in | Wt 225.0 lb

## 2016-02-26 DIAGNOSIS — M545 Low back pain, unspecified: Secondary | ICD-10-CM

## 2016-02-26 DIAGNOSIS — G8929 Other chronic pain: Secondary | ICD-10-CM | POA: Diagnosis not present

## 2016-02-26 NOTE — Progress Notes (Signed)
Pre visit review using our clinic review tool, if applicable. No additional management support is needed unless otherwise documented below in the visit note. 

## 2016-02-26 NOTE — Progress Notes (Signed)
Subjective:    Patient ID: William Faulkner, male    DOB: 07-Jul-1934, 82 y.o.   MRN: [id number]  DOS:  02/26/2016 Type of visit - description : Acute visit, back pain Interval history: His main concern today is ongoing back pain, for several months. Likes a referral. The pain is mild to moderate but steady, after he walks for a while, back pain increases and he feels like his back "freezes up". At that point, his chronic deep ache in the legs increases as well.   Review of Systems   no bladder or bowel incontinence  Past Medical History:  Diagnosis Date  . AORTIC ANEUR UNSPEC SITE WITHOUT MENTION RUPTURE   . AORTIC INSUFFICIENCY   . BPH (benign prostatic hyperplasia)   . CAD, ARTERY BYPASS GRAFT   . CEREBROVASCULAR DISEASE   . DIAB W/O COMP TYPE II/UNS NOT STATED UNCNTRL   . Hyperlipidemia   . HYPERTENSION, ESSENTIAL NOS   . NEOPLASM, MALIGNANT, BLADDER, HX OF   . Rotator cuff tear    Left shoulder,     Past Surgical History:  Procedure Laterality Date  . BACK SURGERY  2003   lower  . BLADDER TUMOR EXCISION     Surgery x6, Dr.Tannebaum  . BLEPHAROPLASTY  1980   bilaterally, Dr Gershon Crane  . COLONOSCOPY W/ POLYPECTOMY     Dr Ree Shay [facility]  . CORONARY ARTERY BYPASS GRAFT  2011   x 5  . CYSTOSCOPY  09/02/2012  . INGUINAL HERNIA REPAIR  10/24/2011   Procedure: HERNIA REPAIR INGUINAL ADULT;  Surgeon: Earnstine Regal, MD;  Location: [facility];  Service: General;  Laterality: Right;  Repair Right Inguinal Hernia with Mesh, Umbilical Hernia Repair with Mesh  . LUMBAR EPIDURAL INJECTION  2008  . ROTATOR CUFF REPAIR  2009   right; Dr Shellia Carwin  . SHOULDER ARTHROSCOPY WITH ROTATOR CUFF REPAIR AND SUBACROMIAL DECOMPRESSION  02/14/2014   Right, SAD/DCR, biceps tenodesis  . UMBILICAL HERNIA REPAIR  10/24/2011   Procedure: HERNIA REPAIR UMBILICAL ADULT;  Surgeon: Earnstine Regal, MD;  Location: [facility];  Service: General;  Laterality: N/A;    Social History   Social History  .  Marital status: Married    Spouse name: N/A  . Number of children: 3  . Years of education: N/A   Occupational History  . retired    Social History Main Topics  . Smoking status: Former Smoker    Packs/day: 0.30    Years: 15.00    Types: Cigarettes    Quit date: 01/22/1984  . Smokeless tobacco: Never Used  . Alcohol use No     Comment: rare, wine   . Drug use: No  . Sexual activity: Not on file   Other Topics Concern  . Not on file   Social History Narrative   8 G-children   Married x 56 years      Allergies as of 02/26/2016      Reactions   Atorvastatin Other (See Comments)   REACTION: increased cpk   Celecoxib Other (See Comments)   REACTION: palpitations= celebrex   Sulfonamide Derivatives Other (See Comments)   Unknown   Zolpidem Tartrate Other (See Comments)   REACTION: made patient feel weird= ambien      Medication List       Accurate as of 02/26/16  1:28 PM. Always use your most recent med list.          albuterol 108 (90 Base) MCG/ACT inhaler Commonly known as:  PROVENTIL HFA;VENTOLIN HFA 2 puffs 10-15 minutes before going outside into cold weather.   aspirin 81 MG tablet Take 81 mg by mouth daily.   benazepril 40 MG tablet Commonly known as:  LOTENSIN Take 1 tablet (40 mg total) by mouth daily.   cholecalciferol 1000 units tablet Commonly known as:  VITAMIN D Take 1,000 Units by mouth daily.   gabapentin 600 MG tablet Commonly known as:  NEURONTIN Take 600 mg by mouth 2 (two) times daily.   hydrochlorothiazide 12.5 MG capsule Commonly known as:  MICROZIDE Take 1 capsule (12.5 mg total) by mouth daily.   ketoconazole 2 % cream Commonly known as:  NIZORAL Apply 1 application topically 2 (two) times daily.   metFORMIN 850 MG tablet Commonly known as:  GLUCOPHAGE Take 1 tablet (850 mg total) by mouth 2 (two) times daily with a meal.   propranolol ER 80 MG 24 hr capsule Commonly known as:  INDERAL LA Take 1 capsule (80 mg total) by  mouth daily.   rosuvastatin 20 MG tablet Commonly known as:  CRESTOR TAKE 1 TABLET DAILY   timolol 0.5 % ophthalmic solution Commonly known as:  TIMOPTIC Place 1 drop into both eyes every morning. As directed          Objective:   Physical Exam BP 132/78 (BP Location: Left Arm, Patient Position: Sitting, Cuff Size: Normal)   Pulse (!) 58   Temp 97.5 F (36.4 C) (Oral)   Resp 14   Ht 6\' 2"  (1.88 m)   Wt 225 lb (102.1 kg)   SpO2 95%   BMI 28.89 kg/m  General:   Well developed, well nourished . NAD.  HEENT:  Normocephalic . Face symmetric, atraumatic MSK: No TTP at the low back. Skin: Not pale. Not jaundice Neurologic:  alert & oriented X3.  Speech normal, gait appropriate for age and unassisted DTRs not present in the knees, decrease in the ankles, symmetric. Straight leg test negative Psych--  Cognition and judgment appear intact.  Cooperative with normal attention span and concentration.  Behavior appropriate. No anxious or depressed appearing.      Assessment & Plan:   Assessment Diabetes, neuropathy HTN Hyperlipidemia CV: -CAD, CABG -Aortic insufficiency, dilated aortic root -Carotid artery disease 40-59% 10-2014, [place] 11-2015 : 1-39%, next per cards  -Ascending aortic aneurysm, last CT chest 10-2015 GU: BPH, bladder cancer Thyroid nodules: Small per [place]  03-2014 Cholelithiasis by CT 01-2014 MSK: h/o back surgery 2003 Preglaucoma  PLAN: Back pain: For several months, as described above, he also has deep ache in the legs; at this point, having both symptoms, spinal stenosis is high in the differential. He request a referral, will refer to neurosurgery Dr. Trenton Gammon at his request. See below Deep ache in the legs: W/u in the last few months including unremarkable ABIs, a CK of 290 (slightly elevated), NCS: + for Neuropathy, wnl- Sedimentation rate, 123456, folic acid, RPR  , failure to improve with Lyrica ( he actually got worse), back on gabapentin. Plan: Refer  to neurosurgery, see above. RTC 4 CPX in about 3 months

## 2016-02-26 NOTE — Patient Instructions (Addendum)
Will refer to Dr Trenton Gammon  Next visit in 3 months for a physical

## 2016-02-27 NOTE — Assessment & Plan Note (Signed)
Back pain: For several months, as described above, he also has deep ache in the legs; at this point, having both symptoms, spinal stenosis is high in the differential. He request a referral, will refer to neurosurgery Dr. Trenton Gammon at his request. See below Deep ache in the legs: W/u in the last few months including unremarkable ABIs, a CK of 290 (slightly elevated), NCS: + for Neuropathy, wnl- Sedimentation rate, 123456, folic acid, RPR  , failure to improve with Lyrica ( he actually got worse), back on gabapentin. Plan: Refer to neurosurgery, see above. RTC 4 CPX in about 3 months

## 2016-02-29 NOTE — Telephone Encounter (Signed)
Pt seen at [facility] on 02/26/2016, discussed w/ [place].

## 2016-03-06 ENCOUNTER — Telehealth: Payer: Self-pay | Admitting: *Deleted

## 2016-03-06 NOTE — Telephone Encounter (Signed)
Pt declines

## 2016-03-25 ENCOUNTER — Other Ambulatory Visit: Payer: Self-pay | Admitting: Cardiology

## 2016-04-09 ENCOUNTER — Telehealth: Payer: Self-pay | Admitting: Internal Medicine

## 2016-04-09 NOTE — Telephone Encounter (Signed)
Caller name: Sleight,Flora J Relation to pt: spouse Call back number:[phone]  Reason for call:  Spouse states patient was seen by neurosurgeon which advised patient PCP will follow up with further instruction 3 weeks ago,please advise

## 2016-04-09 NOTE — Telephone Encounter (Signed)
Please advise? Notes from Dr. Rita Ohara have been scanned to chart.

## 2016-04-09 NOTE — Telephone Encounter (Signed)
Was seen 03/12/2016, he had claudication,? neurological. They rx  a MRI. They recommended ABIs if the MRI was negative. Did he have the MRI done, was normal? Let me know,  if that is the case I 'll rec  ABIs and a office visit.

## 2016-04-09 NOTE — Telephone Encounter (Signed)
LMOM informing Pt's wife, William Faulkner of recommendations, informed we needed to know if MRI was completed and if normal or not before further advice can be given. Instructed to call if questions/concerns.

## 2016-04-11 NOTE — Telephone Encounter (Signed)
Called the 2 available numbers, left a message.

## 2016-04-11 NOTE — Telephone Encounter (Signed)
Spoke w/ Dianah Field, she is very frustrated that I did not call her back the other day, informed her that I did call back to [phone] and left message for call back, I was informed that they do not have a cell number, informed I apologize I was given that number for call back. Informed her that the only OV notes received from Dr. Sherwood Gambler was dated 03/12/2016 and MRI was recommended, per Flora MRI was completed and normal (no date given), informed that PCP recommended Pt come into office to discuss and would need to see MRI results. Flora again expressed her unhappiness with [place], [place] and Dr. Larose Kells, express my apologizes but informed her that I was in clinic w/ PCP and unable to discuss at length at this time. Dr. Larose Kells offered to call personally himself once completed seeing Pt's today, Dianah Field then became more frustrated demanding time for them, again informed Dr. Larose Kells would call this afternoon and hung up (provider needing me for procedure).

## 2016-04-16 NOTE — Telephone Encounter (Signed)
Our office manager already talked with the patient last week. On 04-12-16 I got additional information from Dr Rita Ohara office : --MRI lumbar spine from 03/15/2016: Reported to be scan --OV 03/18/2016: Surgery believe claudication is not neurogenic, recommend to come to me for further workup specifically arterial Dopplers. If large vessels are felt to be obstructive, vascular surgery consult will be in order. If not, claudication is likely due to small vessel disease. Patient has an appointment to see me tomorrow

## 2016-04-17 ENCOUNTER — Encounter: Payer: Self-pay | Admitting: Internal Medicine

## 2016-04-17 ENCOUNTER — Ambulatory Visit (INDEPENDENT_AMBULATORY_CARE_PROVIDER_SITE_OTHER): Payer: Medicare Other | Admitting: Internal Medicine

## 2016-04-17 VITALS — BP 126/44 | HR 56 | Temp 97.8°F | Resp 16 | Ht 76.0 in | Wt 223.4 lb

## 2016-04-17 DIAGNOSIS — R42 Dizziness and giddiness: Secondary | ICD-10-CM

## 2016-04-17 DIAGNOSIS — M545 Low back pain: Secondary | ICD-10-CM | POA: Diagnosis not present

## 2016-04-17 DIAGNOSIS — G629 Polyneuropathy, unspecified: Secondary | ICD-10-CM | POA: Diagnosis not present

## 2016-04-17 DIAGNOSIS — G8929 Other chronic pain: Secondary | ICD-10-CM

## 2016-04-17 NOTE — Patient Instructions (Signed)
We are referring you to Dr. Posey Pronto  I recommend you to use a cane  Hold Crestor for 2 weeks then  go back on it. See if that have any impact on the  pain  Next visit here 05-2016 for a physical

## 2016-04-17 NOTE — Progress Notes (Signed)
Subjective:    Patient ID: William Faulkner, male    DOB: [birth year], 82 y.o.   MRN: [id number]  DOS:  04/17/2016 Type of visit - description : Acute visit Interval history: Patient is here for further evaluation of back pain and associated claudication. See previous visit. He also has neuropathy, currently on gabapentin, pain is relatively well controlled with occasional exacerbations. Wonders if he could take extra gaba prn (which is okay).  At the end of the visit, he also mentioned that he feels off balance from time to time, has sustained several falls fortunately with no loss of consciousness or major injury. He feels that he does not feel well with his feet and has "lost his confidence" when he walks. He denies any stroke symptoms such as diplopia, slurred speech, motor deficits.    Review of Systems   Past Medical History:  Diagnosis Date  . AORTIC ANEUR UNSPEC SITE WITHOUT MENTION RUPTURE   . AORTIC INSUFFICIENCY   . BPH (benign prostatic hyperplasia)   . CAD, ARTERY BYPASS GRAFT   . CEREBROVASCULAR DISEASE   . DIAB W/O COMP TYPE II/UNS NOT STATED UNCNTRL   . Hyperlipidemia   . HYPERTENSION, ESSENTIAL NOS   . NEOPLASM, MALIGNANT, BLADDER, HX OF   . Rotator cuff tear    Left shoulder,     Past Surgical History:  Procedure Laterality Date  . BACK SURGERY  2003   lower  . BLADDER TUMOR EXCISION     Surgery x6, Dr.Tannebaum  . BLEPHAROPLASTY  1980   bilaterally, Dr Gershon Crane  . COLONOSCOPY W/ POLYPECTOMY     Dr Ree Shay [facility]  . CORONARY ARTERY BYPASS GRAFT  2011   x 5  . CYSTOSCOPY  09/02/2012  . INGUINAL HERNIA REPAIR  10/24/2011   Procedure: HERNIA REPAIR INGUINAL ADULT;  Surgeon: Earnstine Regal, MD;  Location: [facility];  Service: General;  Laterality: Right;  Repair Right Inguinal Hernia with Mesh, Umbilical Hernia Repair with Mesh  . LUMBAR EPIDURAL INJECTION  2008  . ROTATOR CUFF REPAIR  2009   right; Dr Shellia Carwin  . SHOULDER ARTHROSCOPY WITH ROTATOR CUFF  REPAIR AND SUBACROMIAL DECOMPRESSION  02/14/2014   Right, SAD/DCR, biceps tenodesis  . UMBILICAL HERNIA REPAIR  10/24/2011   Procedure: HERNIA REPAIR UMBILICAL ADULT;  Surgeon: Earnstine Regal, MD;  Location: [facility];  Service: General;  Laterality: N/A;    Social History   Social History  . Marital status: Married    Spouse name: N/A  . Number of children: 3  . Years of education: N/A   Occupational History  . retired    Social History Main Topics  . Smoking status: Former Smoker    Packs/day: 0.30    Years: 15.00    Types: Cigarettes    Quit date: 01/22/1984  . Smokeless tobacco: Never Used  . Alcohol use No     Comment: rare, wine   . Drug use: No  . Sexual activity: Not on file   Other Topics Concern  . Not on file   Social History Narrative   8 G-children   Married x 56 years      Allergies as of 04/17/2016      Reactions   Atorvastatin Other (See Comments)   REACTION: increased cpk   Celecoxib Other (See Comments)   REACTION: palpitations= celebrex   Sulfonamide Derivatives Other (See Comments)   Unknown   Zolpidem Tartrate Other (See Comments)   REACTION: made patient feel weird= Lorrin Mais  Medication List       Accurate as of 04/17/16  2:47 PM. Always use your most recent med list.          albuterol 108 (90 Base) MCG/ACT inhaler Commonly known as:  PROVENTIL HFA;VENTOLIN HFA 2 puffs 10-15 minutes before going outside into cold weather.   aspirin 81 MG tablet Take 81 mg by mouth daily.   benazepril 40 MG tablet Commonly known as:  LOTENSIN Take 1 tablet (40 mg total) by mouth daily.   cholecalciferol 1000 units tablet Commonly known as:  VITAMIN D Take 1,000 Units by mouth daily.   gabapentin 600 MG tablet Commonly known as:  NEURONTIN Take 600 mg by mouth 2 (two) times daily.   ketoconazole 2 % cream Commonly known as:  NIZORAL Apply 1 application topically 2 (two) times daily.   metFORMIN 850 MG tablet Commonly known as:   GLUCOPHAGE Take 1 tablet (850 mg total) by mouth 2 (two) times daily with a meal.   propranolol ER 80 MG 24 hr capsule Commonly known as:  INDERAL LA Take 1 capsule (80 mg total) by mouth daily.   rosuvastatin 20 MG tablet Commonly known as:  CRESTOR Take 1 tablet (20 mg total) by mouth daily.   timolol 0.5 % ophthalmic solution Commonly known as:  TIMOPTIC Place 1 drop into both eyes every morning. As directed          Objective:   Physical Exam BP (!) 126/44 (BP Location: Left Arm, Patient Position: Sitting, Cuff Size: Normal)   Pulse (!) 56   Temp 97.8 F (36.6 C) (Oral)   Resp 16   Ht 6\' 4"  (1.93 m)   Wt 223 lb 6.4 oz (101.3 kg)   SpO2 100%   BMI 27.19 kg/m  General:   Well developed, well nourished . NAD.  HEENT:  Normocephalic . Face symmetric, atraumatic Abdomen: Soft, nontender, no bruit. Lower extremity: +/+++ Pitting edema pretibially, symmetric. Normal femoral pulses, present but slightly decreased pedal pulses. Skin normal. Skin: Not pale. Not jaundice Neurologic:  alert & oriented X3.  Speech normal, gait appropriate for age and unassisted Psych--  Cognition and judgment appear intact.  Cooperative with normal attention span and concentration.  Behavior appropriate. No anxious or depressed appearing.      Assessment & Plan:   Assessment Diabetes Neuropathy: [id number]: Normal V56, folic acid, RPR. CK slightly elevated. NCS severe polyneuropathy HTN Hyperlipidemia CV: -CAD, CABG -Aortic insufficiency, dilated aortic root -Carotid artery disease 40-59% 10-2014, [place] 11-2015 : 1-39%, next per cards  -Ascending aortic aneurysm, last CT chest 10-2015 GU: BPH, bladder cancer Thyroid nodules: Small per [place]  03-2014 Cholelithiasis by CT 01-2014 MSK: h/o back surgery 2003 Preglaucoma  PLAN: Back pain, deep ache in the legs: S/p eval by neurosurgery, had a MRI lumbar spine from 03/15/2016, Dr Rita Ohara believe claudication is not neurogenic, rec pt to  see me for further workup specifically arterial Dopplers. If large vessels are felt to be obstructive, vascular surgery consult will be in order. If not, claudication is likely due to small vessel disease. I reviewed the chart, normal ABIs 12 -2015 and 8--2017. Suspect claudication is from small vessel disease and back pain from back DJD. Patient has a hard time understanding the diagnosis and accepting it. He is counseled to the best of my ability. Will send a message to Dr. Stanford Breed. Any suggestions? Neuropathy: On gabapentin 600 mg twice a day, from time to time she still have breakthrough sx, he wonders  if he could take an additional Neurontin as needed. That is acceptable. Dizziness-falls: As described above with no stroke symptoms. Likely due to neuropathy, recommend to use a cane and PT:  declined. I'm concern about the issue, will refer to neurology for a more formal eval of dizzines. High cholesterol: With back pain and deep ache in the legs, I recommend a trial holding  Crestor and see if that makes any difference although the pain is likely claudication for small vessel disease.  .Today, I spent more than  42  min with the patient: >50% of the time counseling regards the difference  between large vessel vascular disease vs small vessel vascular disease vs neuropathy.  Also the fact that we don't have a good treatment for small vessel vascular disease except good control of cardiovascular risk factors. The patient certainly is disturbed by this fact

## 2016-04-17 NOTE — Progress Notes (Signed)
Pre visit review using our clinic review tool, if applicable. No additional management support is needed unless otherwise documented below in the visit note. 

## 2016-04-18 ENCOUNTER — Encounter: Payer: Self-pay | Admitting: Neurology

## 2016-04-18 NOTE — Assessment & Plan Note (Signed)
Back pain, deep ache in the legs: S/p eval by neurosurgery, had a MRI lumbar spine from 03/15/2016, Dr Rita Ohara believe claudication is not neurogenic, rec pt to see me for further workup specifically arterial Dopplers. If large vessels are felt to be obstructive, vascular surgery consult will be in order. If not, claudication is likely due to small vessel disease. I reviewed the chart, normal ABIs 12 -2015 and 8--2017. Suspect claudication is from small vessel disease and back pain from back DJD. Patient has a hard time understanding the diagnosis and accepting it. He is counseled to the best of my ability. Will send a message to Dr. Stanford Breed. Any suggestions? Neuropathy: On gabapentin 600 mg twice a day, from time to time she still have breakthrough sx, he wonders if he could take an additional Neurontin as needed. That is acceptable. Dizziness-falls: As described above with no stroke symptoms. Likely due to neuropathy, recommend to use a cane and PT:  declined. I'm concern about the issue, will refer to neurology for a more formal eval of dizzines. High cholesterol: With back pain and deep ache in the legs, I recommend a trial holding  Crestor and see if that makes any difference although the pain is likely claudication for small vessel disease.

## 2016-04-22 ENCOUNTER — Telehealth: Payer: Self-pay | Admitting: Internal Medicine

## 2016-04-22 DIAGNOSIS — I739 Peripheral vascular disease, unspecified: Secondary | ICD-10-CM

## 2016-04-22 NOTE — Telephone Encounter (Signed)
Advise  patient, I discussed the case with his cardiology, we agreed to do the following: Exercise ABIs. DX claudication.

## 2016-04-23 NOTE — Telephone Encounter (Signed)
Orders placed.

## 2016-05-15 ENCOUNTER — Other Ambulatory Visit: Payer: Self-pay | Admitting: Internal Medicine

## 2016-07-01 ENCOUNTER — Ambulatory Visit: Payer: Medicare Other | Admitting: Neurology

## 2016-07-10 NOTE — Progress Notes (Signed)
[facility] Clinic Note - Initial Visit   Date: 07/11/16  ELIAZ FOUT MRN: [id number] DOB: 1934/10/07   Dear Dr. Larose Kells:  Thank you for your kind referral of [place] for consultation of neuropathy and imbalance. Although his history is well known to you, please allow [place] to reiterate it for the purpose of our medical record. The patient was accompanied to the clinic by self.   History of Present Illness: CAROLYN MANISCALCO is a 82 y.o. right-handed Caucasian male with CAD, diabetes mellitus, hypertension, hyperlipidemia, AAA, and BPH presenting for evaluation of neuropathy and imbalance.    Starting around late 2016, he began having intermittent burning and sensitivity of the soles of the feet and constant numbness of the distal toes. He has imbalance, but walks unassisted and has not suffered any falls. He has noticed greater difficulty with balance when playing golf, especially on an incline.  He has had two EMGs with our office which showed sensorimotor polyneuropathy affecting the legs and mild-moderate lumbosacral radiculopathy affecting L3-5.  He has well-controlled diabetes (last HbA1c 6.4), no history of alcoholism, and no family history of neuropathy.  He has been on gabapentin 664m twice daily, but is unsure whether it helps or not.    He went on a 2 mile walk and when he was returning home, he felt low back tightness and inability to move his legs which lasts about 15 minutes.  He denies having weakness or buckling of the legs.   He was saw Dr. NSherwood Gamblerfor evaluation who did not see that symptoms were from neurogenic claudication.  Out-side paper records, electronic medical record, and images have been reviewed where available and summarized as:  NCS/EMG of the legs 09/19/2015: 1. The electrophysiologic findings are most consistent with a generalized sensorimotor polyneuropathy, axon loss in type, affecting the lower extremities. Overall,  these findings are severe in degree electrically.  When compared to his electrodiagnostic testing dated July 12th, 2017 his neuropathy appears stable, but there is new lumbosacral radiculopathy as noted below. 2. Chronic L5 radiculopathy affecting bilateral lower extremities; moderate in degree electrically. 3. Chronic and L3-L4 radiculopathy affecting the right lower extremity, mild in degree electrically.  Lab Results  Component Value Date   TSH 2.90 01/18/2014   Lab Results  Component Value Date   HGBA1C 6.4 08/23/2015   Lab Results  Component Value Date   VITAMINB12 260 08/23/2015     Past Medical History:  Diagnosis Date  . AORTIC ANEUR UNSPEC SITE WITHOUT MENTION RUPTURE   . AORTIC INSUFFICIENCY   . BPH (benign prostatic hyperplasia)   . CAD, ARTERY BYPASS GRAFT   . CEREBROVASCULAR DISEASE   . DIAB W/O COMP TYPE II/UNS NOT STATED UNCNTRL   . Hyperlipidemia   . HYPERTENSION, ESSENTIAL NOS   . NEOPLASM, MALIGNANT, BLADDER, HX OF   . Rotator cuff tear    Left shoulder,     Past Surgical History:  Procedure Laterality Date  . BACK SURGERY  2003   lower  . BLADDER TUMOR EXCISION     Surgery x6, Dr.Tannebaum  . BLEPHAROPLASTY  1980   bilaterally, Dr SGershon Crane . COLONOSCOPY W/ POLYPECTOMY     Dr PRee ShayGI  . CORONARY ARTERY BYPASS GRAFT  2011   x 5  . CYSTOSCOPY  09/02/2012  . INGUINAL HERNIA REPAIR  10/24/2011   Procedure: HERNIA REPAIR INGUINAL ADULT;  Surgeon: TEarnstine Regal MD;  Location: [facility];  Service: General;  Laterality: Right;  Repair Right Inguinal Hernia with Mesh, Umbilical Hernia Repair with Mesh  . LUMBAR EPIDURAL INJECTION  2008  . ROTATOR CUFF REPAIR  2009   right; Dr Shellia Carwin  . SHOULDER ARTHROSCOPY WITH ROTATOR CUFF REPAIR AND SUBACROMIAL DECOMPRESSION  02/14/2014   Right, SAD/DCR, biceps tenodesis  . UMBILICAL HERNIA REPAIR  10/24/2011   Procedure: HERNIA REPAIR UMBILICAL ADULT;  Surgeon: Earnstine Regal, MD;  Location: [facility];  Service:  General;  Laterality: N/A;     Medications:  Outpatient Encounter Prescriptions as of 07/11/2016  Medication Sig  . albuterol (PROVENTIL HFA;VENTOLIN HFA) 108 (90 Base) MCG/ACT inhaler 2 puffs 10-15 minutes before going outside into cold weather.  Marland Kitchen aspirin 81 MG tablet Take 81 mg by mouth daily.    . benazepril (LOTENSIN) 40 MG tablet Take 1 tablet (40 mg total) by mouth daily.  . cholecalciferol (VITAMIN D) 1000 UNITS tablet Take 1,000 Units by mouth daily.  Marland Kitchen gabapentin (NEURONTIN) 600 MG tablet Take 600 mg by mouth 2 (two) times daily.   Marland Kitchen ketoconazole (NIZORAL) 2 % cream Apply 1 application topically 2 (two) times daily.  . metFORMIN (GLUCOPHAGE) 850 MG tablet Take 1 tablet (850 mg total) by mouth 2 (two) times daily with a meal.  . propranolol ER (INDERAL LA) 80 MG 24 hr capsule Take 1 capsule (80 mg total) by mouth daily.  . rosuvastatin (CRESTOR) 20 MG tablet Take 1 tablet (20 mg total) by mouth daily.  . timolol (TIMOPTIC) 0.5 % ophthalmic solution Place 1 drop into both eyes every morning. As directed   No facility-administered encounter medications on file as of 07/11/2016.      Allergies:  Allergies  Allergen Reactions  . Atorvastatin Other (See Comments)    REACTION: increased cpk  . Celecoxib Other (See Comments)    REACTION: palpitations= celebrex  . Sulfonamide Derivatives Other (See Comments)    Unknown  . Zolpidem Tartrate Other (See Comments)    REACTION: made patient feel weird= ambien    Family History: Family History  Problem Relation Age of Onset  . Diabetes Mother   . Tremor Father   . Other Sister   . Stroke Neg Hx   . Heart disease Neg Hx   . Cancer Neg Hx     Social History: Social History  Substance Use Topics  . Smoking status: Former Smoker    Packs/day: 0.30    Years: 15.00    Types: Cigarettes    Quit date: 01/22/1984  . Smokeless tobacco: Never Used  . Alcohol use No     Comment: rare, wine    Social History   Social History  Narrative   8 G-children   Married x 56 years   Lives with wife in a one story home.  Has 3 children.  Retired from [employer].  Education: college.     Review of Systems:  CONSTITUTIONAL: No fevers, chills, night sweats, or weight loss.   EYES: No visual changes or eye pain ENT: No hearing changes.  No history of nose bleeds.   RESPIRATORY: No cough, wheezing and shortness of breath.   CARDIOVASCULAR: Negative for chest pain, and palpitations.   GI: Negative for abdominal discomfort, blood in stools or black stools.  No recent change in bowel habits.   GU:  No history of incontinence.   MUSCLOSKELETAL: No history of joint pain or swelling.  No myalgias.   SKIN: Negative for lesions, rash, and itching.   HEMATOLOGY/ONCOLOGY: Negative for prolonged bleeding, bruising easily,  and swollen nodes.  No history of cancer.   ENDOCRINE: Negative for cold or heat intolerance, polydipsia or goiter.   PSYCH:  No depression or anxiety symptoms.   NEURO: As Above.   Vital Signs:  BP 120/64   Pulse (!) 55   Ht _0  (1.93 m)   Wt 222 lb 8 oz (100.9 kg)   SpO2 97%   BMI 27.08 kg/m    General Medical Exam:   General:  Well appearing, comfortable.   Eyes/ENT: see cranial nerve examination.   Neck: No masses appreciated.  Full range of motion without tenderness.  No carotid bruits. Respiratory:  Clear to auscultation, good air entry bilaterally.   Cardiac:  Regular rate and rhythm, harsh systolic murmur.   Extremities:  No deformities, edema, or skin discoloration.  Skin:  No rashes or lesions.  Neurological Exam: MENTAL STATUS including orientation to time, place, person, recent and remote memory, attention span and concentration, language, and fund of knowledge is normal.  Speech is not dysarthric.  CRANIAL NERVES: II:  No visual field defects.  Unremarkable fundi.   III-IV-VI: Pupils equal round and reactive to light.  Normal conjugate, extra-ocular eye movements in all directions of gaze.  No  nystagmus.  No ptosis.   V:  Normal facial sensation.     VII:  Normal facial symmetry and movements.    VIII:  Normal hearing and vestibular function.   IX-X:  Normal palatal movement.   XI:  Normal shoulder shrug and head rotation.   XII:  Normal tongue strength and range of motion, no deviation or fasciculation.  MOTOR:  No atrophy.  Bilateral intention tremor, R > L.   No pronator drift.  Tone is normal.    Right Upper Extremity:    Left Upper Extremity:    Deltoid  5/5   Deltoid  5/5   Biceps  5/5   Biceps  5/5   Triceps  5/5   Triceps  5/5   Wrist extensors  5/5   Wrist extensors  5/5   Wrist flexors  5/5   Wrist flexors  5/5   Finger extensors  5/5   Finger extensors  5/5   Finger flexors  5/5   Finger flexors  5/5   Dorsal interossei  5/5   Dorsal interossei  5/5   Abductor pollicis  5/5   Abductor pollicis  5/5   Tone (Ashworth scale)  0  Tone (Ashworth scale)  0   Right Lower Extremity:    Left Lower Extremity:    Hip flexors  5/5   Hip flexors  5/5   Hip extensors  5/5   Hip extensors  5/5   Knee flexors  5/5   Knee flexors  5/5   Knee extensors  5/5   Knee extensors  5/5   Dorsiflexors  5/5   Dorsiflexors  5/5   Plantarflexors  5/5   Plantarflexors  5/5   Toe extensors  5/5   Toe extensors  5/5   Toe flexors  5/5   Toe flexors  5/5   Tone (Ashworth scale)  0  Tone (Ashworth scale)  0   MSRs:  Right  Left brachioradialis 1+  brachioradialis 1+  biceps 1+  biceps 1+  triceps 1+  triceps 1+  patellar 1+  patellar 1+  ankle jerk 0  ankle jerk 0  Hoffman no  Hoffman no  plantar response down  plantar response down   SENSORY:  Vibration is reduced to 50% at the ankles, trace at toes bilaterally.  Temperature and pin prick is reduced over the dorsum of the feet.  There is marked sway with Rhomberg testing.  COORDINATION/GAIT: Normal finger-to- nose-finger.  Intact rapid alternating movements bilaterally.   Able to rise from a chair without using arms.  Gait narrow based and stable. He is unsteady with tandem gait, and can take a few steps.  Stressed gait is intact.   IMPRESSION: Mr. Cloke is a 82 year-old gentleman referred for evaluation of neuropathy.  His neurological examination shows a distal predominant  large fiber peripheral neuropathy affecting the feet, which is consistent with his electrodiagnostic testing.  I had extensive discussion with the patient regarding the pathogenesis, etiology, management, and natural course of neuropathy.  He has diabetes which is very well controlled, so although this is playing a factor, I suspect that most of his neuropathy is age-related. Neuropathy tends to be slowly progressive and there is no cure, which patient was hoping for.  To be complete, I will test for treatable causes of neuropathy.  We discussed that the goal of management in neuropathy is symptom control.  At this time, he is unsure whether he needs to be on gabapentin and is interested in tapering this to 61m at bedtime only.  For his imbalance, I offered balance therapy, but he would like to try his own exercises for now.  He also has a mild essential tremor of the hands.  This does not seem to be bothersome, so will follow clinically.  If his tremor starts to interfere with his daily activities, we can consider started a medication for it.  PLAN/RECOMMENDATIONS:  1.  Check vitamin B12, MMA, folate, TSH, SPEP with IFE, copper 2.  Reduce gabapentin to 6078mat bedtime, as he would like to determine if the medication is working.  He can increase back to [id number] daily, if he has worsening paresthesias 3.  Recommend balance strengthening exercises such as yoga or tai chi.  He does not wish to have formal PT at this time 4.  Fall precautions discussed.  I encouraged him to use a cane when on uneven surfaces  Return to clinic as needed.   The duration of this appointment visit was 45  minutes of face-to-face time with the patient.  Greater than 50% of this time was spent in counseling, explanation of diagnosis, planning of further management, and coordination of care.   Thank you for allowing me to participate in patient's care.  If I can answer any additional questions, I would be pleased to do so.    Sincerely,    Ikran Patman K. PatPosey ProntoO

## 2016-07-11 ENCOUNTER — Other Ambulatory Visit (INDEPENDENT_AMBULATORY_CARE_PROVIDER_SITE_OTHER): Payer: Medicare Other

## 2016-07-11 ENCOUNTER — Ambulatory Visit (INDEPENDENT_AMBULATORY_CARE_PROVIDER_SITE_OTHER): Payer: Medicare Other | Admitting: Neurology

## 2016-07-11 ENCOUNTER — Encounter: Payer: Self-pay | Admitting: Neurology

## 2016-07-11 VITALS — BP 120/64 | HR 55 | Ht 76.0 in | Wt 222.5 lb

## 2016-07-11 DIAGNOSIS — G250 Essential tremor: Secondary | ICD-10-CM | POA: Diagnosis not present

## 2016-07-11 DIAGNOSIS — G629 Polyneuropathy, unspecified: Secondary | ICD-10-CM

## 2016-07-11 LAB — VITAMIN B12: VITAMIN B 12: 479 pg/mL (ref 211–911)

## 2016-07-11 LAB — FOLATE

## 2016-07-11 LAB — TSH: TSH: 3.21 u[IU]/mL (ref 0.35–4.50)

## 2016-07-11 NOTE — Patient Instructions (Addendum)
Check labs  Reduce gabapentin 600mg  at bedtime, if your tingling pain gets worse, restart gabapentin 600mg  twice daily  Recommend using a cane for uneven surfaces  Return to clinic as needed   [facility]  Preventing Falls in the [place] are common, often dreaded events in the lives of older people. Aside from the obvious injuries and even death that may result, falls can cause wide-ranging consequences including loss of independence, mental decline, decreased activity, and mobility. Younger people are also at risk of falling, especially those with chronic illnesses and fatigue.  Ways to reduce the risk for falling:  * Examine diet and medications. Warm foods and alcohol dilate blood vessels, which can lead to dizziness when standing. Sleep aids, antidepressants, and pain medications can also increase the likelihood of a fall.  * Get a vison exam. Poor vision, cataracts, and glaucoma increase the chances of falling.  * Check foot gear. Shoes should fit snugly and have a sturdy, nonskid sole and broad, low heel.  * Participate in a physician-approved exercise program to build and maintain muscle strength and improve balance and coordination.  * Increase vitamin D intake. Vitamin D improves muscle strength and increases the amount of calcium the body is able to absorb and deposit in bones.  How to prevent falls from common hazards:  * Floors - Remove all loose wires, cords, and throw rugs. Minimize clutter. Make sure rugs are anchored and smooth. Keep furniture in its usual place.  * Chairs - Use chairs with straight backs, armrests, and firm seats. Add firm cushions to existing pieces to add height.  * Bathroom - Install grab bars and non-skid tape in the tub or shower. Use a bathtub transfer bench or a shower chair with a back support. Use an elevated toilet seat and/or safety rails to assist standing from a low surface. Do not use towel racks or bathroom tissue holders to help you  stand.  * Lighting - Make sure halls, stairways, and entrances are well-lit. Install a night light in your bathroom or hallway. Make sure there is a light switch at the top and bottom of the staircase. Turn lights on if you get up in the middle of the night. Make sure lamps or light switches are within reach of the bed if you have to get up during the night.  * Kitchen - Install non-skid rubber mats near the sink and stove. Clean spills immediately. Store frequently used utensils, pots, and pans between waist and eye level. This helps prevent reaching and bending. Sit when getting things out of the lower cupboards.  * Living room / [place]in furniture with wide spaces in between, giving enough room to move around. Establish a route through the living room that gives you something to hold onto as you walk.  * Stairs - Make sure treads, rails, and rugs are secure. Install a rail on both sides of the stairs. If stairs are a threat, it might be helpful to arrange most of your activities on the lower level to reduce the number of times you must climb the stairs.  * Entrances and doorways - Install metal handles on the walls adjacent to the doorknobs of all doors to make it more secure as you travel through the doorway.  Tips for maintaining balance:  * Keep at least one hand free at all times Try using a backpack or fanny pack to hold things rather than carrying them in your hands. Never  carry objects in both hands when walking as this interferes with keeping your balance.  * Attempt to swing both arms from front to back while walking. This might require a conscious effort if Parkinson's disease has diminished your movement. It will, however, help you to maintain balance and posture, and reduce fatigue.  * Consciously lift your feet off the ground when walking. Shuffling and dragging of the feet is a common culprit in losing your balance.  * When trying to navigate turns, use a "U" technique of facing  forward and making a wide turn, rather than pivoting sharply.  * Try to stand with your feet shoulder-length apart. When your feet are close together for any length of time, you increase your risk of losing your balance and falling.  * Do one thing at a time. Do not try to walk and accomplish another task, such as reading or looking around. The decrease in your automatic reflexes complicates motor function, so the less distraction, the better.  * Do not wear rubber or gripping soled shoes, they might "catch" on the floor and cause tripping.  * Move slowly when changing positions. Use deliberate, concentrated movements and, if needed, use a grab bar or walking aid. Count fifteen (15) seconds after standing to begin walking.  * If balance is a continuous problem, you might want to consider a walking aid such as a cane, walking stick, or walker. Once you have mastered walking with help, you may be ready to try it again on your own.  This information is provided by [facility] and is not intended to replace the medical advice of your physician or other health care providers. Please consult your physician or other health care providers for advice regarding your specific medical condition.

## 2016-07-13 LAB — COPPER, SERUM: COPPER: 89 ug/dL (ref 70–175)

## 2016-07-15 LAB — METHYLMALONIC ACID, SERUM: Methylmalonic Acid, Quant: 166 nmol/L (ref 87–318)

## 2016-07-16 LAB — PROTEIN ELECTROPHORESIS, SERUM
ALBUMIN ELP: 4 g/dL (ref 3.8–4.8)
ALPHA-1-GLOBULIN: 0.3 g/dL (ref 0.2–0.3)
Alpha-2-Globulin: 0.8 g/dL (ref 0.5–0.9)
BETA 2: 0.3 g/dL (ref 0.2–0.5)
Beta Globulin: 0.4 g/dL (ref 0.4–0.6)
GAMMA GLOBULIN: 0.8 g/dL (ref 0.8–1.7)
TOTAL PROTEIN, SERUM ELECTROPHOR: 6.7 g/dL (ref 6.1–8.1)

## 2016-07-16 LAB — IMMUNOFIXATION ELECTROPHORESIS
IGA: 162 mg/dL (ref 81–463)
IGG (IMMUNOGLOBIN G), SERUM: 879 mg/dL (ref 694–1618)
IgM, Serum: 58 mg/dL (ref 48–271)

## 2016-07-29 ENCOUNTER — Other Ambulatory Visit: Payer: Self-pay | Admitting: Internal Medicine

## 2016-10-13 ENCOUNTER — Other Ambulatory Visit: Payer: Self-pay | Admitting: Internal Medicine

## 2016-10-14 ENCOUNTER — Telehealth: Payer: Self-pay | Admitting: *Deleted

## 2016-10-14 DIAGNOSIS — I712 Thoracic aortic aneurysm, without rupture, unspecified: Secondary | ICD-10-CM

## 2016-10-14 NOTE — Telephone Encounter (Signed)
Left message for pt to call, it is time to schedule CTA and follow up appointment.

## 2016-10-15 NOTE — Telephone Encounter (Signed)
CTA has been scheduled and lab orders mailed to the patient.

## 2016-10-22 LAB — BASIC METABOLIC PANEL
BUN/Creatinine Ratio: 20 (ref 10–24)
BUN: 24 mg/dL (ref 8–27)
CALCIUM: 9.8 mg/dL (ref 8.6–10.2)
CO2: 26 mmol/L (ref 20–29)
Chloride: 104 mmol/L (ref 96–106)
Creatinine, Ser: 1.19 mg/dL (ref 0.76–1.27)
GFR calc Af Amer: 66 mL/min/{1.73_m2} (ref >59)
GFR, EST NON AFRICAN AMERICAN: 57 mL/min/{1.73_m2} — AB (ref >59)
GLUCOSE: 76 mg/dL (ref 65–99)
POTASSIUM: 4.8 mmol/L (ref 3.5–5.2)
SODIUM: 141 mmol/L (ref 134–144)

## 2016-11-04 ENCOUNTER — Ambulatory Visit (INDEPENDENT_AMBULATORY_CARE_PROVIDER_SITE_OTHER)
Admission: RE | Admit: 2016-11-04 | Discharge: 2016-11-04 | Disposition: A | Discharge status: Home / Self Care | Payer: Medicare Other | Source: Ambulatory Visit | Attending: Cardiology | Admitting: Cardiology

## 2016-11-04 ENCOUNTER — Telehealth: Payer: Self-pay | Admitting: *Deleted

## 2016-11-04 DIAGNOSIS — I712 Thoracic aortic aneurysm, without rupture, unspecified: Secondary | ICD-10-CM

## 2016-11-04 MED ORDER — IOPAMIDOL (ISOVUE-370) INJECTION 76%
100.0000 mL | Freq: Once | INTRAVENOUS | Status: AC | PRN
  Administered 2016-11-04: 100 mL via INTRAVENOUS

## 2016-11-04 NOTE — Telephone Encounter (Signed)
Advised patient and order placed Wife was asking about another study needing to be done prior to follow

## 2016-11-04 NOTE — Telephone Encounter (Signed)
-----   Message from Lelon Perla, MD sent at 11/04/2016 11:53 AM EDT ----- Fu study 1 yr Kirk Ruths

## 2016-11-06 NOTE — Telephone Encounter (Signed)
Spoke with pt wife, aware no other testing needed prior to appointment. Follow up scheduled

## 2016-11-20 ENCOUNTER — Encounter: Payer: Self-pay | Admitting: Internal Medicine

## 2016-11-20 ENCOUNTER — Ambulatory Visit (INDEPENDENT_AMBULATORY_CARE_PROVIDER_SITE_OTHER): Payer: Medicare Other | Admitting: Internal Medicine

## 2016-11-20 VITALS — BP 118/78 | HR 55 | Temp 97.7°F | Resp 14 | Ht 76.0 in | Wt 225.0 lb

## 2016-11-20 DIAGNOSIS — G629 Polyneuropathy, unspecified: Secondary | ICD-10-CM

## 2016-11-20 DIAGNOSIS — G8929 Other chronic pain: Secondary | ICD-10-CM | POA: Diagnosis not present

## 2016-11-20 DIAGNOSIS — M545 Low back pain: Secondary | ICD-10-CM | POA: Diagnosis not present

## 2016-11-20 DIAGNOSIS — E1140 Type 2 diabetes mellitus with diabetic neuropathy, unspecified: Secondary | ICD-10-CM

## 2016-11-20 DIAGNOSIS — M47812 Spondylosis without myelopathy or radiculopathy, cervical region: Secondary | ICD-10-CM | POA: Diagnosis not present

## 2016-11-20 MED ORDER — AMITRIPTYLINE HCL 25 MG PO TABS
25.0000 mg | ORAL_TABLET | Freq: Every day | ORAL | 1 refills | Status: DC
Start: 2016-11-20 — End: 2017-04-24

## 2016-11-20 MED ORDER — KETOCONAZOLE 2 % EX CREA: 1.0000 "application " | TOPICAL_CREAM | Freq: Two times a day (BID) | CUTANEOUS | 5 refills | Status: DC

## 2016-11-20 NOTE — Progress Notes (Signed)
Subjective:    Patient ID: William Faulkner, male    DOB: Jan 19, 1935, 82 y.o.   MRN: [id number]  DOS:  11/20/2016 Type of visit - description : acute, here with wife Interval history: His main concern continue to be back pain, few weeks ago he took a long walk, ~ 1.5 miles; at the end, his legs freeze up, he had to stop for like 15 minutes and then he was able to go back home. At the time, he had back pain but it wasn't "terrible", the problem was legs "freezing up". He did experience claudication as usual. He is quite frustrated about back pain and wonders if another local injection might help. Neuropathy: note  from neurology and labs reviewed  Review of Systems   Past Medical History:  Diagnosis Date  . AORTIC ANEUR UNSPEC SITE WITHOUT MENTION RUPTURE   . AORTIC INSUFFICIENCY   . BPH (benign prostatic hyperplasia)   . CAD, ARTERY BYPASS GRAFT   . CEREBROVASCULAR DISEASE   . DIAB W/O COMP TYPE II/UNS NOT STATED UNCNTRL   . Hyperlipidemia   . HYPERTENSION, ESSENTIAL NOS   . NEOPLASM, MALIGNANT, BLADDER, HX OF   . Rotator cuff tear    Left shoulder,     Past Surgical History:  Procedure Laterality Date  . BACK SURGERY  2003   lower  . BLADDER TUMOR EXCISION     Surgery x6, Dr.Tannebaum  . BLEPHAROPLASTY  1980   bilaterally, Dr Gershon Crane  . COLONOSCOPY W/ POLYPECTOMY     Dr Ree Shay [facility]  . CORONARY ARTERY BYPASS GRAFT  2011   x 5  . CYSTOSCOPY  09/02/2012  . INGUINAL HERNIA REPAIR  10/24/2011   Procedure: HERNIA REPAIR INGUINAL ADULT;  Surgeon: Earnstine Regal, MD;  Location: [facility];  Service: General;  Laterality: Right;  Repair Right Inguinal Hernia with Mesh, Umbilical Hernia Repair with Mesh  . LUMBAR EPIDURAL INJECTION  2008  . ROTATOR CUFF REPAIR  2009   right; Dr Shellia Carwin  . SHOULDER ARTHROSCOPY WITH ROTATOR CUFF REPAIR AND SUBACROMIAL DECOMPRESSION  02/14/2014   Right, SAD/DCR, biceps tenodesis  . UMBILICAL HERNIA REPAIR  10/24/2011   Procedure: HERNIA  REPAIR UMBILICAL ADULT;  Surgeon: Earnstine Regal, MD;  Location: [facility];  Service: General;  Laterality: N/A;    Social History   Social History  . Marital status: Married    Spouse name: N/A  . Number of children: 3  . Years of education: N/A   Occupational History  . retired    Social History Main Topics  . Smoking status: Former Smoker    Packs/day: 0.30    Years: 15.00    Types: Cigarettes    Quit date: 01/22/1984  . Smokeless tobacco: Never Used  . Alcohol use No     Comment: rare, wine   . Drug use: No  . Sexual activity: Not on file   Other Topics Concern  . Not on file   Social History Narrative   8 G-children   Married x 56 years   Lives with wife in a one story home.  Has 3 children.     Retired from [employer].  Education: college.       Allergies as of 11/20/2016      Reactions   Atorvastatin Other (See Comments)   REACTION: increased cpk   Celecoxib Other (See Comments)   REACTION: palpitations= celebrex   Sulfonamide Derivatives Other (See Comments)   Unknown   Zolpidem Tartrate Other (  See Comments)   REACTION: made patient feel weird= ambien      Medication List       Accurate as of 11/20/16  3:22 PM. Always use your most recent med list.          albuterol 108 (90 Base) MCG/ACT inhaler Commonly known as:  PROVENTIL HFA;VENTOLIN HFA 2 puffs 10-15 minutes before going outside into cold weather.   aspirin 81 MG tablet Take 81 mg by mouth daily.   benazepril 40 MG tablet Commonly known as:  LOTENSIN Take 1 tablet (40 mg total) by mouth daily.   cholecalciferol 1000 units tablet Commonly known as:  VITAMIN D Take 1,000 Units by mouth daily.   gabapentin 600 MG tablet Commonly known as:  NEURONTIN Take 600 mg by mouth at bedtime.   ketoconazole 2 % cream Commonly known as:  NIZORAL Apply 1 application topically 2 (two) times daily.   metFORMIN 850 MG tablet Commonly known as:  GLUCOPHAGE Take 1 tablet (850 mg total) by mouth 2 (two)  times daily with a meal.   propranolol ER 80 MG 24 hr capsule Commonly known as:  INDERAL LA Take 1 capsule (80 mg total) by mouth daily.   rosuvastatin 20 MG tablet Commonly known as:  CRESTOR Take 1 tablet (20 mg total) by mouth daily.   timolol 0.5 % ophthalmic solution Commonly known as:  TIMOPTIC Place 1 drop into both eyes every morning. As directed          Objective:   Physical Exam BP 118/78 (BP Location: Left Arm, Patient Position: Sitting, Cuff Size: Small)   Pulse (!) 55   Temp 97.7 F (36.5 C) (Oral)   Resp 14   Ht 6\' 4"  (1.93 m)   Wt 225 lb (102.1 kg)   SpO2 96%   BMI 27.39 kg/m  General:   Well developed, well nourished . NAD.  HEENT:  Normocephalic . Face symmetric, atraumatic Lungs:  CTA B Normal respiratory effort, no intercostal retractions, no accessory muscle use. Heart: RRR,  no murmur.  No pretibial edema bilaterally  Skin: Not pale. Not jaundice Neurologic:  alert & oriented X3.  Speech normal, gait appropriate for age and unassisted Psych--  Cognition and judgment appear intact.  Cooperative with normal attention span and concentration.  Behavior appropriate. No anxious or depressed appearing.      Assessment & Plan:    Assessment Diabetes Pain management: *Neuropathy (painful type):  ---01-6107: Normal U04, folic acid, RPR. CK slightly elevated. NCS severe polyneuropathy ---Re-eval by neuro 06-2016: exam c/w neuropathy. Labs (-) ---intolerant  to lyrica  *MSK: h/o back surgery 2003 *Claudication:   Unremarkable ABIs 2017, back MRI 02-2016 (spinal stenosis not mention on the  report), saw Dr. Rita Ohara, symptoms not neurogenic.  Probably small vessel disease PVD HTN Hyperlipidemia CV: -CAD, CABG -Aortic insufficiency, dilated aortic root -Carotid artery disease 40-59% 10-2014, [place] 11-2015 : 1-39%, next per cards  -Ascending aortic aneurysm, last CT chest 10-2015 GU: BPH, bladder cancer Thyroid nodules: Small per [place]   03-2014 Cholelithiasis by CT 01-2014 Preglaucoma  PLAN: Back pain: Ongoing problem, see previous entry. Would like to see about another local injection, request a referral to see Dr. Brien Few, will do. Okay to take Tylenol as needed, if he lies to take Aleve that is okay but only one or 2 times a week.  Patient is quite frustrated about the pain and inability to do things he used to do before, I think frustration itself is magnifing  the pain.  Counseled. Neuropathy: Saw neurology 06/2016, large fiber peripheral neuropathy was evident on physical exam and it was consistent with a electrodiagnostic testing. They suspected could be age-related, blood work came back normal including Cooper, B12, TSH, SPEP, etc. Because drowsiness, gabapentin was decreased to once a day, no clear-cut relief of sxs with gabapentin thus is willing to try other options. Previously did not tolerate Lyrica. Trial with amitriptyline 25 mg daily at bedtime, drowsiness discussed. DM: On metformin, check A1c and LFTs Head a flu shot RTC 3 months.   Today, I spent more than 25  min with the patient: >50% of the time counseling regards back pain, neuropathy, treatment options, going over previous tests with the patient, questions answered to the best of my ability.  At the end of the visit, patient felt somewhat relief and left the office with more hope regards to chronic pain.  The pain is actually creating a lot of frustration which magnifies his pain in my opinion.

## 2016-11-20 NOTE — Progress Notes (Signed)
Pre visit review using our clinic review tool, if applicable. No additional management support is needed unless otherwise documented below in the visit note. 

## 2016-11-20 NOTE — Patient Instructions (Addendum)
GO TO THE LAB : Get the blood work     GO TO THE FRONT DESK Schedule your next appointment for a  Check up in 3 months  Stop gabapentin  Start amitriptyline 25 mg: 1 tablet at bedtime to help the neuropathy  It will cause drowsiness, if you are feeling too drowsy, take only half amitriptyline.

## 2016-11-21 DIAGNOSIS — M549 Dorsalgia, unspecified: Secondary | ICD-10-CM

## 2016-11-21 DIAGNOSIS — G8929 Other chronic pain: Secondary | ICD-10-CM | POA: Insufficient documentation

## 2016-11-21 DIAGNOSIS — G629 Polyneuropathy, unspecified: Secondary | ICD-10-CM | POA: Insufficient documentation

## 2016-11-21 LAB — HEMOGLOBIN A1C: Hgb A1c MFr Bld: 6.6 % — ABNORMAL HIGH (ref 4.6–6.5)

## 2016-11-21 LAB — AST: AST: 25 U/L (ref 0–37)

## 2016-11-21 LAB — ALT: ALT: 20 U/L (ref 0–53)

## 2016-11-21 NOTE — Assessment & Plan Note (Signed)
Back pain: Ongoing problem, see previous entry. Would like to see about another local injection, request a referral to see Dr. Brien Few, will do. Okay to take Tylenol as needed, if he lies to take Aleve that is okay but only one or 2 times a week.  Patient is quite frustrated about the pain and inability to do things he used to do before, I think frustration itself is magnifing the pain.  Counseled. Neuropathy: Saw neurology 06/2016, large fiber peripheral neuropathy was evident on physical exam and it was consistent with a electrodiagnostic testing. They suspected could be age-related, blood work came back normal including Cooper, B12, TSH, SPEP, etc. Because drowsiness, gabapentin was decreased to once a day, no clear-cut relief of sxs with gabapentin thus is willing to try other options. Previously did not tolerate Lyrica. Trial with amitriptyline 25 mg daily at bedtime, drowsiness discussed. DM: On metformin, check A1c and LFTs Head a flu shot RTC 3 months.

## 2016-12-25 ENCOUNTER — Ambulatory Visit (HOSPITAL_COMMUNITY)
Admission: RE | Admit: 2016-12-25 | Discharge: 2016-12-25 | Disposition: A | Discharge status: Home / Self Care | Payer: Medicare Other | Source: Ambulatory Visit | Attending: Cardiovascular Disease | Admitting: Cardiovascular Disease

## 2016-12-25 DIAGNOSIS — I739 Peripheral vascular disease, unspecified: Secondary | ICD-10-CM | POA: Diagnosis not present

## 2016-12-26 ENCOUNTER — Other Ambulatory Visit: Payer: Self-pay | Admitting: Internal Medicine

## 2016-12-31 NOTE — Progress Notes (Signed)
HPI: FU CAD, aortic insufficiency, and cerebrovascular disease. A cardiac catheterization in May of 2011 revealed severe three-vessel coronary artery disease and normal LV function. He subsequently underwent coronary artery bypass and graft in May of 2011 (left internal mammary artery to left anterior descending artery, saphenous vein graft to first diagonal, saphenous vein graft obtuse marginal 1, sequential saphenous vein graft to distal right coronary and posterior descending artery). Note an intraoperative transesophageal echocardiogram showed mild to moderate aortic insufficiency and a dilated aortic root. However this was felt not to require surgical intervention at that time. ABIs August 2017 normal. Carotid Dopplers November 2017 showed 1-39% bilateral stenosis. Echo 12/17 showed normal LV function, grade 1 DD, mild to moderate AI and moderately dilated ascending aorta. Abd ultrasound 12/17 showed >50 in proximal to mid aorta; no aneurysm. CTA 10/18 showed 4.7 x 4.4 ascending thoracic aorta. Since I last saw him, he denies dyspnea, exertional chest pain or syncope.  Current Outpatient Medications  Medication Sig Dispense Refill  . albuterol (PROVENTIL HFA;VENTOLIN HFA) 108 (90 Base) MCG/ACT inhaler 2 puffs 10-15 minutes before going outside into cold weather. 1 Inhaler 0  . aspirin 81 MG tablet Take 81 mg by mouth daily.      . benazepril (LOTENSIN) 40 MG tablet Take 1 tablet (40 mg total) by mouth daily. 90 tablet 2  . cholecalciferol (VITAMIN D) 1000 UNITS tablet Take 1,000 Units by mouth daily.    Marland Kitchen gabapentin (NEURONTIN) 100 MG capsule Take 100 mg by mouth 3 (three) times daily.    Marland Kitchen ketoconazole (NIZORAL) 2 % cream Apply 1 application topically 2 (two) times daily. 60 g 5  . metFORMIN (GLUCOPHAGE) 850 MG tablet Take 1 tablet (850 mg total) by mouth 2 (two) times daily with a meal. 180 tablet 1  . propranolol ER (INDERAL LA) 80 MG 24 hr capsule Take 1 capsule (80 mg total) by mouth  daily. 90 capsule 1  . rosuvastatin (CRESTOR) 20 MG tablet Take 1 tablet (20 mg total) by mouth daily. 90 tablet 2  . timolol (TIMOPTIC) 0.5 % ophthalmic solution Place 1 drop into both eyes every morning. As directed    . amitriptyline (ELAVIL) 25 MG tablet Take 1 tablet (25 mg total) by mouth at bedtime. 30 tablet 1   No current facility-administered medications for this visit.      Past Medical History:  Diagnosis Date  . AORTIC ANEUR UNSPEC SITE WITHOUT MENTION RUPTURE   . AORTIC INSUFFICIENCY   . BPH (benign prostatic hyperplasia)   . CAD, ARTERY BYPASS GRAFT   . CEREBROVASCULAR DISEASE   . DIAB W/O COMP TYPE II/UNS NOT STATED UNCNTRL   . Hyperlipidemia   . HYPERTENSION, ESSENTIAL NOS   . NEOPLASM, MALIGNANT, BLADDER, HX OF   . Rotator cuff tear    Left shoulder,     Past Surgical History:  Procedure Laterality Date  . BACK SURGERY  2003   lower  . BLADDER TUMOR EXCISION     Surgery x6, Dr.Tannebaum  . BLEPHAROPLASTY  1980   bilaterally, Dr Gershon Crane  . COLONOSCOPY W/ POLYPECTOMY     Dr Ree Shay [facility]  . CORONARY ARTERY BYPASS GRAFT  2011   x 5  . CYSTOSCOPY  09/02/2012  . INGUINAL HERNIA REPAIR  10/24/2011   Procedure: HERNIA REPAIR INGUINAL ADULT;  Surgeon: Earnstine Regal, MD;  Location: [facility];  Service: General;  Laterality: Right;  Repair Right Inguinal Hernia with Mesh, Umbilical Hernia Repair with Mesh  .  LUMBAR EPIDURAL INJECTION  2008  . ROTATOR CUFF REPAIR  2009   right; Dr Shellia Carwin  . SHOULDER ARTHROSCOPY WITH ROTATOR CUFF REPAIR AND SUBACROMIAL DECOMPRESSION  02/14/2014   Right, SAD/DCR, biceps tenodesis  . UMBILICAL HERNIA REPAIR  10/24/2011   Procedure: HERNIA REPAIR UMBILICAL ADULT;  Surgeon: Earnstine Regal, MD;  Location: [facility];  Service: General;  Laterality: N/A;    Social History   Socioeconomic History  . Marital status: Married    Spouse name: Not on file  . Number of children: 3  . Years of education: Not on file  . Highest education  level: Not on file  Social Needs  . Financial resource strain: Not on file  . Food insecurity - worry: Not on file  . Food insecurity - inability: Not on file  . Transportation needs - medical: Not on file  . Transportation needs - non-medical: Not on file  Occupational History  . Occupation: retired  Tobacco Use  . Smoking status: Former Smoker    Packs/day: 0.30    Years: 15.00    Pack years: 4.50    Types: Cigarettes    Last attempt to quit: 01/22/1984    Years since quitting: 32.9  . Smokeless tobacco: Never Used  Substance and Sexual Activity  . Alcohol use: No    Alcohol/week: 0.0 oz    Comment: rare, wine   . Drug use: No  . Sexual activity: Not on file  Other Topics Concern  . Not on file  Social History Narrative   8 G-children   Married x 56 years   Lives with wife in a one story home.  Has 3 children.     Retired from [employer].  Education: college.     Family History  Problem Relation Age of Onset  . Diabetes Mother   . Tremor Father   . Other Sister   . Stroke Neg Hx   . Heart disease Neg Hx   . Cancer Neg Hx     ROS: Some back and leg pain but no fevers or chills, productive cough, hemoptysis, dysphasia, odynophagia, melena, hematochezia, dysuria, hematuria, rash, seizure activity, orthopnea, PND, pedal edema, claudication. Remaining systems are negative.  Physical Exam: Well-developed well-nourished in no acute distress.  Skin is warm and dry.  HEENT is normal.  Neck is supple.  Chest is clear to auscultation with normal expansion.  Cardiovascular exam is regular rate and rhythm. 2/6 systolic and diastolic murmur Abdominal exam nontender or distended. No masses palpated. Extremities show no edema. neuro grossly intact  ECG- sinus bradycardia, first-degree AV block, right bundle branch block, inferior infarct. personally reviewed  A/P  1 coronary artery disease-patient doing well with no chest pain or dyspnea. Continue medical therapy with aspirin  and statin. Prior inferior infarct cannot be excluded on ECG. Schedule echocardiogram to assess wall motion and LV function. Patient is not having exertional chest pain.  2 hypertension-blood pressure is controlled. Continue present medications.  3 hyperlipidemia-continue statin. Check lipids and liver.  4 thoracic aortic aneurysm-patient will need follow-up CTA October 2019.  5 aortic insufficiency-repeat echocardiogram.  6 carotid artery disease-continue aspirin and statin.  Kirk Ruths, MD

## 2017-01-03 ENCOUNTER — Ambulatory Visit (INDEPENDENT_AMBULATORY_CARE_PROVIDER_SITE_OTHER): Payer: Medicare Other | Admitting: Cardiology

## 2017-01-03 ENCOUNTER — Encounter: Payer: Self-pay | Admitting: Cardiology

## 2017-01-03 VITALS — BP 148/58 | HR 57 | Ht 74.0 in | Wt 229.0 lb

## 2017-01-03 DIAGNOSIS — I10 Essential (primary) hypertension: Secondary | ICD-10-CM | POA: Diagnosis not present

## 2017-01-03 DIAGNOSIS — I2510 Atherosclerotic heart disease of native coronary artery without angina pectoris: Secondary | ICD-10-CM | POA: Diagnosis not present

## 2017-01-03 DIAGNOSIS — I359 Nonrheumatic aortic valve disorder, unspecified: Secondary | ICD-10-CM | POA: Diagnosis not present

## 2017-01-03 DIAGNOSIS — E785 Hyperlipidemia, unspecified: Secondary | ICD-10-CM | POA: Diagnosis not present

## 2017-01-03 LAB — HEPATIC FUNCTION PANEL
ALBUMIN: 4.4 g/dL (ref 3.5–4.7)
ALK PHOS: 45 IU/L (ref 39–117)
ALT: 18 IU/L (ref 0–44)
AST: 20 IU/L (ref 0–40)
BILIRUBIN TOTAL: 0.3 mg/dL (ref 0.0–1.2)
Bilirubin, Direct: 0.11 mg/dL (ref 0.00–0.40)
TOTAL PROTEIN: 6.7 g/dL (ref 6.0–8.5)

## 2017-01-03 LAB — LIPID PANEL W/O CHOL/HDL RATIO
Cholesterol, Total: 118 mg/dL (ref 100–199)
HDL: 40 mg/dL (ref >39)
LDL Calculated: 45 mg/dL (ref 0–99)
Triglycerides: 163 mg/dL — ABNORMAL HIGH (ref 0–149)
VLDL Cholesterol Cal: 33 mg/dL (ref 5–40)

## 2017-01-03 LAB — CK TOTAL AND CKMB (NOT AT ARMC)
CK MB INDEX: 7.5 ng/mL (ref 0.0–10.4)
Total CK: 213 U/L — ABNORMAL HIGH (ref 24–204)

## 2017-01-03 NOTE — Patient Instructions (Signed)
Schedule echocardiogram   Lab work today   Lipid and liver panels,total ck    Your physician wants you to follow-up in: 1 year with Dr.Crenshaw. You will receive a reminder letter in the mail two months in advance. If you don't receive a letter, please call our office to schedule the follow-up appointment.

## 2017-01-07 ENCOUNTER — Encounter: Payer: Self-pay | Admitting: *Deleted

## 2017-01-14 ENCOUNTER — Other Ambulatory Visit: Payer: Self-pay | Admitting: Internal Medicine

## 2017-01-14 ENCOUNTER — Other Ambulatory Visit: Payer: Self-pay | Admitting: Cardiology

## 2017-01-16 ENCOUNTER — Ambulatory Visit (HOSPITAL_COMMUNITY): Payer: Medicare Other | Attending: Cardiovascular Disease

## 2017-01-16 ENCOUNTER — Other Ambulatory Visit: Payer: Self-pay

## 2017-01-16 DIAGNOSIS — I10 Essential (primary) hypertension: Secondary | ICD-10-CM | POA: Insufficient documentation

## 2017-01-16 DIAGNOSIS — I359 Nonrheumatic aortic valve disorder, unspecified: Secondary | ICD-10-CM | POA: Insufficient documentation

## 2017-01-16 DIAGNOSIS — E785 Hyperlipidemia, unspecified: Secondary | ICD-10-CM | POA: Diagnosis present

## 2017-01-29 ENCOUNTER — Telehealth: Payer: Self-pay | Admitting: Cardiology

## 2017-01-29 NOTE — Telephone Encounter (Signed)
Patient and wife notified of results.

## 2017-01-29 NOTE — Telephone Encounter (Signed)
Notes recorded by Lelon Perla, MD on 01/28/2017 at 8:30 AM EST AI mild to moderate Kirk Ruths

## 2017-01-29 NOTE — Telephone Encounter (Signed)
New message  ° ° °Patient calling back for echo results.   °

## 2017-02-24 ENCOUNTER — Ambulatory Visit: Payer: Medicare Other | Admitting: Internal Medicine

## 2017-03-01 ENCOUNTER — Other Ambulatory Visit: Payer: Self-pay | Admitting: Internal Medicine

## 2017-03-13 ENCOUNTER — Other Ambulatory Visit: Payer: Self-pay | Admitting: Internal Medicine

## 2017-03-13 NOTE — Telephone Encounter (Signed)
Copied from [place] #[id number]. Topic: Quick Communication - Rx Refill/Question >> Mar 13, 2017  4:23 PM Boyd Kerbs wrote:  Medication: gabapentin (NEURONTIN) 600 mg 1 tablet a day  but for 90 day supply  Has the patient contacted their pharmacy? No.   (Agent: If no, request that the patient contact the pharmacy for the refill.)   Preferred Pharmacy (with phone number or street name):   [place], [place] [address] [place] [address] Phone: [phone] Fax: [phone]  Agent: Please be advised that RX refills may take up to 3 business days. We ask that you follow-up with your pharmacy.

## 2017-03-14 MED ORDER — GABAPENTIN 100 MG PO CAPS: 100.0000 mg | ORAL_CAPSULE | Freq: Three times a day (TID) | ORAL | 1 refills | Status: DC

## 2017-03-14 NOTE — Telephone Encounter (Signed)
LOV:11/20/16  PCP: Dr Larose Kells  Pharmacy:[facility] [place]

## 2017-03-18 ENCOUNTER — Other Ambulatory Visit: Payer: Self-pay

## 2017-03-18 MED ORDER — GABAPENTIN 600 MG PO TABS: 600.0000 mg | ORAL_TABLET | Freq: Every day | ORAL | 1 refills | Status: DC

## 2017-04-24 ENCOUNTER — Ambulatory Visit (INDEPENDENT_AMBULATORY_CARE_PROVIDER_SITE_OTHER): Payer: Medicare Other | Admitting: Internal Medicine

## 2017-04-24 ENCOUNTER — Encounter: Payer: Self-pay | Admitting: Internal Medicine

## 2017-04-24 VITALS — BP 126/74 | HR 55 | Temp 97.6°F | Resp 14 | Ht 74.0 in | Wt 225.5 lb

## 2017-04-24 DIAGNOSIS — M545 Low back pain, unspecified: Secondary | ICD-10-CM

## 2017-04-24 DIAGNOSIS — E1142 Type 2 diabetes mellitus with diabetic polyneuropathy: Secondary | ICD-10-CM | POA: Diagnosis not present

## 2017-04-24 DIAGNOSIS — I872 Venous insufficiency (chronic) (peripheral): Secondary | ICD-10-CM | POA: Diagnosis not present

## 2017-04-24 DIAGNOSIS — I10 Essential (primary) hypertension: Secondary | ICD-10-CM

## 2017-04-24 DIAGNOSIS — G8929 Other chronic pain: Secondary | ICD-10-CM

## 2017-04-24 DIAGNOSIS — I25700 Atherosclerosis of coronary artery bypass graft(s), unspecified, with unstable angina pectoris: Secondary | ICD-10-CM | POA: Diagnosis not present

## 2017-04-24 LAB — BASIC METABOLIC PANEL
BUN: 29 mg/dL — ABNORMAL HIGH (ref 6–23)
CO2: 28 meq/L (ref 19–32)
Calcium: 9.5 mg/dL (ref 8.4–10.5)
Chloride: 103 mEq/L (ref 96–112)
Creatinine, Ser: 1.23 mg/dL (ref 0.40–1.50)
GFR: 60.04 mL/min (ref >60.00)
GLUCOSE: 99 mg/dL (ref 70–99)
Potassium: 5.1 mEq/L (ref 3.5–5.1)
Sodium: 137 mEq/L (ref 135–145)

## 2017-04-24 LAB — CBC WITH DIFFERENTIAL/PLATELET
BASOS ABS: 0 10*3/uL (ref 0.0–0.1)
Basophils Relative: 0.6 % (ref 0.0–3.0)
EOS PCT: 4.2 % (ref 0.0–5.0)
Eosinophils Absolute: 0.3 10*3/uL (ref 0.0–0.7)
HEMATOCRIT: 41.3 % (ref 39.0–52.0)
Hemoglobin: 13.5 g/dL (ref 13.0–17.0)
LYMPHS PCT: 25.6 % (ref 12.0–46.0)
Lymphs Abs: 1.7 10*3/uL (ref 0.7–4.0)
MCHC: 32.8 g/dL (ref 30.0–36.0)
MCV: 87.2 fl (ref 78.0–100.0)
MONOS PCT: 8.4 % (ref 3.0–12.0)
Monocytes Absolute: 0.5 10*3/uL (ref 0.1–1.0)
Neutro Abs: 4 10*3/uL (ref 1.4–7.7)
Neutrophils Relative %: 61.2 % (ref 43.0–77.0)
Platelets: 202 10*3/uL (ref 150.0–400.0)
RBC: 4.74 Mil/uL (ref 4.22–5.81)
RDW: 13.4 % (ref 11.5–15.5)
WBC: 6.5 10*3/uL (ref 4.0–10.5)

## 2017-04-24 LAB — HEMOGLOBIN A1C: Hgb A1c MFr Bld: 6.5 % (ref 4.6–6.5)

## 2017-04-24 NOTE — Progress Notes (Signed)
Subjective:    Patient ID: William Faulkner, male    DOB: [birth year], 83 y.o.   MRN: [id number]  DOS:  04/24/2017 Type of visit - description : rov Interval history: Since last OV, sx are the same, patient continued to be frustrated but pain with ambulation, neuropathy, swelling and lack of balance. Chart is reviewed   Review of Systems   Past Medical History:  Diagnosis Date  . AORTIC ANEUR UNSPEC SITE WITHOUT MENTION RUPTURE   . AORTIC INSUFFICIENCY   . BPH (benign prostatic hyperplasia)   . CAD, ARTERY BYPASS GRAFT   . CEREBROVASCULAR DISEASE   . DIAB W/O COMP TYPE II/UNS NOT STATED UNCNTRL   . Hyperlipidemia   . HYPERTENSION, ESSENTIAL NOS   . NEOPLASM, MALIGNANT, BLADDER, HX OF   . Rotator cuff tear    Left shoulder,     Past Surgical History:  Procedure Laterality Date  . BACK SURGERY  2003   lower  . BLADDER TUMOR EXCISION     Surgery x6, Dr.Tannebaum  . BLEPHAROPLASTY  1980   bilaterally, Dr Gershon Crane  . COLONOSCOPY W/ POLYPECTOMY     Dr Ree Shay [facility]  . CORONARY ARTERY BYPASS GRAFT  2011   x 5  . CYSTOSCOPY  09/02/2012  . INGUINAL HERNIA REPAIR  10/24/2011   Procedure: HERNIA REPAIR INGUINAL ADULT;  Surgeon: Earnstine Regal, MD;  Location: [facility];  Service: General;  Laterality: Right;  Repair Right Inguinal Hernia with Mesh, Umbilical Hernia Repair with Mesh  . LUMBAR EPIDURAL INJECTION  2008  . ROTATOR CUFF REPAIR  2009   right; Dr Shellia Carwin  . SHOULDER ARTHROSCOPY WITH ROTATOR CUFF REPAIR AND SUBACROMIAL DECOMPRESSION  02/14/2014   Right, SAD/DCR, biceps tenodesis  . UMBILICAL HERNIA REPAIR  10/24/2011   Procedure: HERNIA REPAIR UMBILICAL ADULT;  Surgeon: Earnstine Regal, MD;  Location: [facility];  Service: General;  Laterality: N/A;    Social History   Socioeconomic History  . Marital status: Married    Spouse name: Not on file  . Number of children: 3  . Years of education: Not on file  . Highest education level: Not on file  Occupational History  .  Occupation: retired  Scientific laboratory technician  . Financial resource strain: Not on file  . Food insecurity:    Worry: Not on file    Inability: Not on file  . Transportation needs:    Medical: Not on file    Non-medical: Not on file  Tobacco Use  . Smoking status: Former Smoker    Packs/day: 0.30    Years: 15.00    Pack years: 4.50    Types: Cigarettes    Last attempt to quit: 01/22/1984    Years since quitting: 33.2  . Smokeless tobacco: Never Used  Substance and Sexual Activity  . Alcohol use: No    Alcohol/week: 0.0 oz    Comment: rare, wine   . Drug use: No  . Sexual activity: Not on file  Lifestyle  . Physical activity:    Days per week: Not on file    Minutes per session: Not on file  . Stress: Not on file  Relationships  . Social connections:    Talks on phone: Not on file    Gets together: Not on file    Attends religious service: Not on file    Active member of club or organization: Not on file    Attends meetings of clubs or organizations: Not on file  Relationship status: Not on file  . Intimate partner violence:    Fear of current or ex partner: Not on file    Emotionally abused: Not on file    Physically abused: Not on file    Forced sexual activity: Not on file  Other Topics Concern  . Not on file  Social History Narrative   8 G-children   Married x 56 years   Lives with wife in a one story home.  Has 3 children.     Retired from [employer].  Education: college.       Allergies as of 04/24/2017      Reactions   Atorvastatin Other (See Comments)   REACTION: increased cpk   Celecoxib Other (See Comments)   REACTION: palpitations= celebrex   Sulfonamide Derivatives Other (See Comments)   Unknown   Zolpidem Tartrate Other (See Comments)   REACTION: made patient feel weird= ambien      Medication List        Accurate as of 04/24/17  9:45 AM. Always use your most recent med list.          albuterol 108 (90 Base) MCG/ACT inhaler Commonly known as:  PROVENTIL  HFA;VENTOLIN HFA 2 puffs 10-15 minutes before going outside into cold weather.   amitriptyline 25 MG tablet Commonly known as:  ELAVIL Take 1 tablet (25 mg total) by mouth at bedtime.   aspirin 81 MG tablet Take 81 mg by mouth daily.   benazepril 40 MG tablet Commonly known as:  LOTENSIN Take 1 tablet (40 mg total) by mouth daily.   cholecalciferol 1000 units tablet Commonly known as:  VITAMIN D Take 1,000 Units by mouth daily.   gabapentin 600 MG tablet Commonly known as:  NEURONTIN Take 1 tablet (600 mg total) by mouth at bedtime.   ketoconazole 2 % cream Commonly known as:  NIZORAL Apply 1 application topically 2 (two) times daily.   metFORMIN 850 MG tablet Commonly known as:  GLUCOPHAGE Take 1 tablet (850 mg total) by mouth 2 (two) times daily with a meal.   propranolol ER 80 MG 24 hr capsule Commonly known as:  INDERAL LA Take 1 capsule (80 mg total) by mouth daily.   rosuvastatin 20 MG tablet Commonly known as:  CRESTOR TAKE 1 TABLET DAILY   timolol 0.5 % ophthalmic solution Commonly known as:  TIMOPTIC Place 1 drop into both eyes every morning. As directed          Objective:   Physical Exam BP 126/74 (BP Location: Left Arm, Patient Position: Sitting, Cuff Size: Normal)   Pulse (!) 55   Temp 97.6 F (36.4 C) (Oral)   Resp 14   Ht 6\' 2"  (1.88 m)   Wt 225 lb 8 oz (102.3 kg)   SpO2 96%   BMI 28.95 kg/m  .General:   Well developed, well nourished . NAD.  HEENT:  Normocephalic . Face symmetric, atraumatic Lungs:  CTA B Normal respiratory effort, no intercostal retractions, no accessory muscle use. Heart: RRR,  no murmur.  +/+++ peri-ankle edema; + pedal pulses   Skin: Not pale. Not jaundice Neurologic:  alert & oriented X3.  Speech normal, gait appropriate for age and unassisted Psych--  Cognition and judgment appear intact.  Cooperative with normal attention span and concentration.  Behavior appropriate. No anxious or depressed appearing.        Assessment & Plan:    Assessment Diabetes Pain management: *Neuropathy (painful type):  ---06-3891: Normal T34, folic acid, RPR.  CK slightly elevated. NCS severe polyneuropathy ---Re-eval by neuro 06-2016: exam c/w neuropathy. Labs (-) ---intolerant  to lyrica ; intolerant to Elavil 04-2017 *MSK: h/o back surgery 2003 *Claudication:   Unremarkable ABIs 2017, back MRI 02-2016 (spinal stenosis not mention on the  report), saw Dr. Rita Ohara, symptoms not neurogenic.  Probably small vessel disease PVD. ABIs neg again 12-2016 HTN Hyperlipidemia CV: -CAD, CABG -Aortic insufficiency, dilated aortic root -Carotid artery disease 40-59% 10-2014, [place] 11-2015 : 1-39%, next per cards  -Ascending aortic aneurysm, last CT chest 10-2015 GU: BPH, bladder cancer Thyroid nodules: Small per [place]  03-2014 Cholelithiasis by CT 01-2014 Preglaucoma  PLAN: Back pain, deep ache in the legs: Since the last visit went to neurosurgery, had a MRI, sxs were not felt to be due to neurogenic claudication. I order a ABI 12/2016, similar to last ABI 2017 which was normal. Patient is deeply frustrated about this issue, unable to do things he enjoys like playing golf. Very distressed by lower extremity edema. I tried to counsel him.  Recommend low-salt, leg elevation and compression stocking. We agreed on  vascular surgery referral, venous insufficiency? Neuropathy: Intolerant to Elavil, on gabapentin CAD, thoracic aneurysm, carotid artery disease, ascending aortic aneurysm: Saw cardiology 12/2016, echocardiogram showed aortic insufficiency moderate. They recommended follow-up CTA 10-2017 for thoracic aneurysm DM: Check A1c, continue metformin.  Last LFTs within normal.  HTN: Check a BMP and CBC.  Continue Lotensin. RTC 6 months  Today, I spent more than  25  min with the patient: >50% of the time counseling regards his swelling, neuropathy, lack of balance.  The patient is extremely frustrated, I listen to all his  concerns.  Wife was here.  "He just cannot accept he is 83 years old".

## 2017-04-24 NOTE — Progress Notes (Signed)
Pre visit review using our clinic review tool, if applicable. No additional management support is needed unless otherwise documented below in the visit note. 

## 2017-04-24 NOTE — Patient Instructions (Signed)
GO TO THE LAB : Get the blood work     GO TO THE FRONT DESK Schedule your next appointment for a checkup in 6 months  Please see your eye doctor regularly

## 2017-04-24 NOTE — Assessment & Plan Note (Signed)
Back pain, deep ache in the legs: Since the last visit went to neurosurgery, had a MRI, sxs were not felt to be due to neurogenic claudication. I order a ABI 12/2016, similar to last ABI 2017 which was normal. Patient is deeply frustrated about this issue, unable to do things he enjoys like playing golf. Very distressed by lower extremity edema. I tried to counsel him.  Recommend low-salt, leg elevation and compression stocking. We agreed on  vascular surgery referral, venous insufficiency? Neuropathy: Intolerant to Elavil, on gabapentin CAD, thoracic aneurysm, carotid artery disease, ascending aortic aneurysm: Saw cardiology 12/2016, echocardiogram showed aortic insufficiency moderate. They recommended follow-up CTA 10-2017 for thoracic aneurysm DM: Check A1c, continue metformin.  Last LFTs within normal.  HTN: Check a BMP and CBC.  Continue Lotensin. RTC 6 months

## 2017-06-11 ENCOUNTER — Encounter (HOSPITAL_COMMUNITY): Payer: Medicare Other

## 2017-06-11 ENCOUNTER — Encounter: Payer: Medicare Other | Admitting: Vascular Surgery

## 2017-06-17 ENCOUNTER — Other Ambulatory Visit: Payer: Self-pay | Admitting: Internal Medicine

## 2017-07-28 ENCOUNTER — Other Ambulatory Visit: Payer: Self-pay | Admitting: Internal Medicine

## 2017-08-23 ENCOUNTER — Other Ambulatory Visit: Payer: Self-pay | Admitting: Internal Medicine

## 2017-09-09 LAB — HM DIABETES EYE EXAM

## 2017-09-16 ENCOUNTER — Encounter: Payer: Self-pay | Admitting: Internal Medicine

## 2017-09-16 ENCOUNTER — Ambulatory Visit (INDEPENDENT_AMBULATORY_CARE_PROVIDER_SITE_OTHER): Payer: Medicare Other | Admitting: Internal Medicine

## 2017-09-16 VITALS — BP 130/56 | HR 55 | Temp 97.6°F | Resp 16 | Ht 74.0 in | Wt 217.4 lb

## 2017-09-16 DIAGNOSIS — M79605 Pain in left leg: Secondary | ICD-10-CM | POA: Diagnosis not present

## 2017-09-16 DIAGNOSIS — M79604 Pain in right leg: Secondary | ICD-10-CM | POA: Diagnosis not present

## 2017-09-16 DIAGNOSIS — E1142 Type 2 diabetes mellitus with diabetic polyneuropathy: Secondary | ICD-10-CM

## 2017-09-16 DIAGNOSIS — I10 Essential (primary) hypertension: Secondary | ICD-10-CM | POA: Diagnosis not present

## 2017-09-16 MED ORDER — GABAPENTIN 600 MG PO TABS: 600.0000 mg | ORAL_TABLET | Freq: Two times a day (BID) | ORAL | 1 refills | Status: DC

## 2017-09-16 NOTE — Patient Instructions (Signed)
GO TO THE LAB : Get the blood work     GO TO THE FRONT DESK Schedule your next appointment for a   Routine check up in 4 months

## 2017-09-16 NOTE — Progress Notes (Signed)
Subjective:    Patient ID: William Faulkner, male    DOB: 1934/02/05, 83 y.o.   MRN: [id number]  DOS:  09/16/2017 Type of visit - description : Follow-up Interval history: See last visit, continue with deep lower extremity achiness. Gabapentin dose increased  and that seems to help to some extent. HTN: Good med compliance, no apparent side effects.  Wt Readings from Last 3 Encounters:  09/16/17 217 lb 6.4 oz (98.6 kg)  04/24/17 225 lb 8 oz (102.3 kg)  01/03/17 229 lb (103.9 kg)    Review of Systems No other concerns   Past Medical History:  Diagnosis Date  . AORTIC ANEUR UNSPEC SITE WITHOUT MENTION RUPTURE   . AORTIC INSUFFICIENCY   . BPH (benign prostatic hyperplasia)   . CAD, ARTERY BYPASS GRAFT   . CEREBROVASCULAR DISEASE   . DIAB W/O COMP TYPE II/UNS NOT STATED UNCNTRL   . Hyperlipidemia   . HYPERTENSION, ESSENTIAL NOS   . NEOPLASM, MALIGNANT, BLADDER, HX OF   . Rotator cuff tear    Left shoulder,     Past Surgical History:  Procedure Laterality Date  . BACK SURGERY  2003   lower  . BLADDER TUMOR EXCISION     Surgery x6, Dr.Tannebaum  . BLEPHAROPLASTY  1980   bilaterally, Dr Gershon Crane  . COLONOSCOPY W/ POLYPECTOMY     Dr Ree Shay [facility]  . CORONARY ARTERY BYPASS GRAFT  2011   x 5  . CYSTOSCOPY  09/02/2012  . INGUINAL HERNIA REPAIR  10/24/2011   Procedure: HERNIA REPAIR INGUINAL ADULT;  Surgeon: Earnstine Regal, MD;  Location: [facility];  Service: General;  Laterality: Right;  Repair Right Inguinal Hernia with Mesh, Umbilical Hernia Repair with Mesh  . LUMBAR EPIDURAL INJECTION  2008  . ROTATOR CUFF REPAIR  2009   right; Dr Shellia Carwin  . SHOULDER ARTHROSCOPY WITH ROTATOR CUFF REPAIR AND SUBACROMIAL DECOMPRESSION  02/14/2014   Right, SAD/DCR, biceps tenodesis  . UMBILICAL HERNIA REPAIR  10/24/2011   Procedure: HERNIA REPAIR UMBILICAL ADULT;  Surgeon: Earnstine Regal, MD;  Location: [facility];  Service: General;  Laterality: N/A;    Social History   Socioeconomic  History  . Marital status: Married    Spouse name: Not on file  . Number of children: 3  . Years of education: Not on file  . Highest education level: Not on file  Occupational History  . Occupation: retired  Scientific laboratory technician  . Financial resource strain: Not on file  . Food insecurity:    Worry: Not on file    Inability: Not on file  . Transportation needs:    Medical: Not on file    Non-medical: Not on file  Tobacco Use  . Smoking status: Former Smoker    Packs/day: 0.30    Years: 15.00    Pack years: 4.50    Types: Cigarettes    Last attempt to quit: 01/22/1984    Years since quitting: 33.6  . Smokeless tobacco: Never Used  Substance and Sexual Activity  . Alcohol use: No    Alcohol/week: 0.0 standard drinks    Comment: rare, wine   . Drug use: No  . Sexual activity: Not on file  Lifestyle  . Physical activity:    Days per week: Not on file    Minutes per session: Not on file  . Stress: Not on file  Relationships  . Social connections:    Talks on phone: Not on file    Gets together:  Not on file    Attends religious service: Not on file    Active member of club or organization: Not on file    Attends meetings of clubs or organizations: Not on file    Relationship status: Not on file  . Intimate partner violence:    Fear of current or ex partner: Not on file    Emotionally abused: Not on file    Physically abused: Not on file    Forced sexual activity: Not on file  Other Topics Concern  . Not on file  Social History Narrative   8 G-children   Married x 56 years   Lives with wife in a one story home.  Has 3 children.     Retired from [employer].  Education: college.       Allergies as of 09/16/2017      Reactions   Atorvastatin Other (See Comments)   REACTION: increased cpk   Celecoxib Other (See Comments)   REACTION: palpitations= celebrex   Sulfonamide Derivatives Other (See Comments)   Unknown   Zolpidem Tartrate Other (See Comments)   REACTION: made patient  feel weird= ambien      Medication List        Accurate as of 09/16/17  2:25 PM. Always use your most recent med list.          aspirin 81 MG tablet Take 81 mg by mouth daily.   benazepril 40 MG tablet Commonly known as:  LOTENSIN Take 1 tablet (40 mg total) by mouth daily.   cholecalciferol 1000 units tablet Commonly known as:  VITAMIN D Take 1,000 Units by mouth daily.   gabapentin 600 MG tablet Commonly known as:  NEURONTIN Take 1 tablet (600 mg total) by mouth at bedtime.   ketoconazole 2 % cream Commonly known as:  NIZORAL Apply 1 application topically 2 (two) times daily.   metFORMIN 850 MG tablet Commonly known as:  GLUCOPHAGE Take 1 tablet (850 mg total) by mouth 2 (two) times daily with a meal.   propranolol ER 80 MG 24 hr capsule Commonly known as:  INDERAL LA Take 1 capsule (80 mg total) by mouth daily.   rosuvastatin 20 MG tablet Commonly known as:  CRESTOR TAKE 1 TABLET DAILY   timolol 0.5 % ophthalmic solution Commonly known as:  TIMOPTIC Place 1 drop into both eyes every morning. As directed          Objective:   Physical Exam BP (!) 130/56 (BP Location: Left Arm, Cuff Size: Normal)   Pulse (!) 55   Temp 97.6 F (36.4 C) (Oral)   Resp 16   Ht 6\' 2"  (1.88 m)   Wt 217 lb 6.4 oz (98.6 kg)   SpO2 97%   BMI 27.91 kg/m  General:   Well developed, NAD, see BMI.  HEENT:  Normocephalic . Face symmetric, atraumatic Lungs:  CTA B Normal respiratory effort, no intercostal retractions, no accessory muscle use. Heart: RRR,  no murmur.  Lower extremities: +/+++ Eating edema from mid pretibial area down.  Good pulses. Skin: Not pale. Not jaundice Neurologic:  alert & oriented X3.  Speech normal, gait appropriate for age and unassisted Psych--  Cognition and judgment appear intact.  Cooperative with normal attention span and concentration.  Behavior appropriate. No anxious or depressed appearing.      Assessment & Plan:    Assessment Diabetes HTN Hyperlipidemia Pain management: *Neuropathy (painful type):  ---02-3760: Normal G31, folic acid, RPR. CK slightly elevated. NCS severe  polyneuropathy ---Re-eval by neuro 06-2016: exam c/w neuropathy. Labs (-) ---intolerant  to lyrica ; intolerant to Elavil 04-2017 *MSK: h/o back surgery 2003 *Claudication:   Unremarkable ABIs 2017, back MRI 02-2016 (spinal stenosis not mention on the  report), saw Dr. Rita Ohara, symptoms not neurogenic.  Probably small vessel disease PVD. ABIs neg again 12-2016 CV: -CAD, CABG -Aortic insufficiency, dilated aortic root -Carotid artery disease 40-59% 10-2014, [place] 11-2015 : 1-39%, next per cards  -Ascending aortic aneurysm, last CT chest 10-2015 GU: BPH, bladder cancer Thyroid nodules: Small per [place]  03-2014 Cholelithiasis by CT 01-2014 Preglaucoma  PLAN: DM: on Metformin, last A1c satisfactory HTN: Seems controlled on Lotensin.  Check BMP and TSH Back pain, deep ache in the legs: See last visit, since then went to see Dr. Assunta Curtis, he suggested a myelogram but at the same time encouraged him to take my offer for a vascular referral to rule out venous insufficiency as the cause for the deep ache in the legs and swelling. Plan: Refer to vascular surgery.  Patient self increase gabapentin to twice daily, it is helping, RF sent. RTC 4 months.

## 2017-09-17 LAB — BASIC METABOLIC PANEL
BUN: 31 mg/dL — ABNORMAL HIGH (ref 6–23)
CALCIUM: 9.9 mg/dL (ref 8.4–10.5)
CO2: 26 meq/L (ref 19–32)
CREATININE: 1.29 mg/dL (ref 0.40–1.50)
Chloride: 102 mEq/L (ref 96–112)
GFR: 56.77 mL/min — ABNORMAL LOW (ref >60.00)
GLUCOSE: 72 mg/dL (ref 70–99)
Potassium: 4.7 mEq/L (ref 3.5–5.1)
Sodium: 138 mEq/L (ref 135–145)

## 2017-09-17 LAB — TSH: TSH: 3 u[IU]/mL (ref 0.35–4.50)

## 2017-09-17 NOTE — Assessment & Plan Note (Signed)
DM: on Metformin, last A1c satisfactory HTN: Seems controlled on Lotensin.  Check BMP and TSH Back pain, deep ache in the legs: See last visit, since then went to see Dr. Assunta Curtis, he suggested a myelogram but at the same time encouraged him to take my offer for a vascular referral to rule out venous insufficiency as the cause for the deep ache in the legs and swelling. Plan: Refer to vascular surgery.  Patient self increase gabapentin to twice daily, it is helping, RF sent. RTC 4 months.

## 2017-09-26 ENCOUNTER — Other Ambulatory Visit: Payer: Self-pay

## 2017-09-26 DIAGNOSIS — I872 Venous insufficiency (chronic) (peripheral): Secondary | ICD-10-CM

## 2017-10-29 ENCOUNTER — Ambulatory Visit: Payer: Medicare Other | Admitting: Internal Medicine

## 2017-11-10 ENCOUNTER — Telehealth: Payer: Self-pay | Admitting: *Deleted

## 2017-11-10 DIAGNOSIS — Z01812 Encounter for preprocedural laboratory examination: Secondary | ICD-10-CM

## 2017-11-10 DIAGNOSIS — Z5181 Encounter for therapeutic drug level monitoring: Secondary | ICD-10-CM

## 2017-11-10 NOTE — Telephone Encounter (Signed)
Patient is scheduled for follow up CTA chest aorta 11/24/17, lab orders placed

## 2017-11-18 ENCOUNTER — Other Ambulatory Visit: Payer: Self-pay

## 2017-11-18 ENCOUNTER — Ambulatory Visit (INDEPENDENT_AMBULATORY_CARE_PROVIDER_SITE_OTHER): Payer: Medicare Other | Admitting: Vascular Surgery

## 2017-11-18 ENCOUNTER — Encounter: Payer: Self-pay | Admitting: Vascular Surgery

## 2017-11-18 ENCOUNTER — Ambulatory Visit (HOSPITAL_COMMUNITY)
Admission: RE | Admit: 2017-11-18 | Discharge: 2017-11-18 | Disposition: A | Discharge status: Home / Self Care | Payer: Medicare Other | Source: Ambulatory Visit | Attending: Vascular Surgery | Admitting: Vascular Surgery

## 2017-11-18 VITALS — BP 137/55 | HR 55 | Resp 18 | Ht 74.0 in | Wt 222.6 lb

## 2017-11-18 DIAGNOSIS — M25561 Pain in right knee: Secondary | ICD-10-CM | POA: Diagnosis not present

## 2017-11-18 DIAGNOSIS — I872 Venous insufficiency (chronic) (peripheral): Secondary | ICD-10-CM | POA: Insufficient documentation

## 2017-11-18 DIAGNOSIS — M25562 Pain in left knee: Secondary | ICD-10-CM

## 2017-11-18 NOTE — Progress Notes (Signed)
[facility] [place]  Patient name: William Faulkner MRN: [id number] DOB: 11/02/1934 Sex: male  REASON FOR CONSULT: Evaluation of lower extremity discomfort  HPI: William Faulkner is a 83 y.o. male, who is in today with his wife.  He reports being very active at his age of 39.  He did a great deal of walking and played golf several times per week.  He reports an episode of walking and having a sudden feeling that both legs froze up and having lower back pain.  He reports that he leaned against a tree for period of time and was able to return to walking.  He rested for a few days and walked again and had similar situation.  He has had decreased at walking for months now due to this concern.  He has had evaluation with neurosurgery with Dr. Sherwood Gambler.  Also has a history of neuropathy.  Prior history of coronary artery bypass grafting with right leg vein harvest.  No history of DVT and no history of peripheral arterial insufficiency.  Past Medical History:  Diagnosis Date  . AORTIC ANEUR UNSPEC SITE WITHOUT MENTION RUPTURE   . AORTIC INSUFFICIENCY   . BPH (benign prostatic hyperplasia)   . CAD, ARTERY BYPASS GRAFT   . Cancer ([place])   . CEREBROVASCULAR DISEASE   . DIAB W/O COMP TYPE II/UNS NOT STATED UNCNTRL   . Hyperlipidemia   . HYPERTENSION, ESSENTIAL NOS   . NEOPLASM, MALIGNANT, BLADDER, HX OF   . Rotator cuff tear    Left shoulder,     Family History  Problem Relation Age of Onset  . Diabetes Mother   . Tremor Father   . Other Sister   . Stroke Neg Hx   . Heart disease Neg Hx   . Cancer Neg Hx     SOCIAL HISTORY: Social History   Socioeconomic History  . Marital status: Married    Spouse name: Not on file  . Number of children: 3  . Years of education: Not on file  . Highest education level: Not on file  Occupational History  . Occupation: retired  Scientific laboratory technician  . Financial resource strain: Not on file  .  Food insecurity:    Worry: Not on file    Inability: Not on file  . Transportation needs:    Medical: Not on file    Non-medical: Not on file  Tobacco Use  . Smoking status: Former Smoker    Packs/day: 0.30    Years: 15.00    Pack years: 4.50    Types: Cigarettes    Last attempt to quit: 01/22/1984    Years since quitting: 33.8  . Smokeless tobacco: Never Used  Substance and Sexual Activity  . Alcohol use: No    Alcohol/week: 0.0 standard drinks    Comment: rare, wine   . Drug use: No  . Sexual activity: Not on file  Lifestyle  . Physical activity:    Days per week: Not on file    Minutes per session: Not on file  . Stress: Not on file  Relationships  . Social connections:    Talks on phone: Not on file    Gets together: Not on file    Attends religious service: Not on file    Active member of club or organization: Not on file    Attends meetings of clubs or organizations: Not on file    Relationship status: Not on file  . Intimate  partner violence:    Fear of current or ex partner: Not on file    Emotionally abused: Not on file    Physically abused: Not on file    Forced sexual activity: Not on file  Other Topics Concern  . Not on file  Social History Narrative   8 G-children   Married x 56 years   Lives with wife in a one story home.  Has 3 children.     Retired from [employer].  Education: college.     Allergies  Allergen Reactions  . Atorvastatin Other (See Comments)    REACTION: increased cpk  . Celecoxib Other (See Comments)    REACTION: palpitations= celebrex  . Sulfonamide Derivatives Other (See Comments)    Unknown  . Zolpidem Tartrate Other (See Comments)    REACTION: made patient feel weird= ambien    Current Outpatient Medications  Medication Sig Dispense Refill  . aspirin 81 MG tablet Take 81 mg by mouth daily.      . benazepril (LOTENSIN) 40 MG tablet Take 1 tablet (40 mg total) by mouth daily. 90 tablet 1  . cholecalciferol (VITAMIN D) 1000 UNITS  tablet Take 1,000 Units by mouth daily.    Marland Kitchen gabapentin (NEURONTIN) 600 MG tablet Take 1 tablet (600 mg total) by mouth 2 (two) times daily. 180 tablet 1  . ketoconazole (NIZORAL) 2 % cream Apply 1 application topically 2 (two) times daily. 60 g 5  . metFORMIN (GLUCOPHAGE) 850 MG tablet Take 1 tablet (850 mg total) by mouth 2 (two) times daily with a meal. 180 tablet 1  . propranolol ER (INDERAL LA) 80 MG 24 hr capsule Take 1 capsule (80 mg total) by mouth daily. 90 capsule 1  . rosuvastatin (CRESTOR) 20 MG tablet TAKE 1 TABLET DAILY 90 tablet 3  . timolol (TIMOPTIC) 0.5 % ophthalmic solution Place 1 drop into both eyes every morning. As directed     No current facility-administered medications for this visit.     REVIEW OF SYSTEMS:  [X]  denotes positive finding, [ ]  denotes negative finding Cardiac  Comments:  Chest pain or chest pressure:    Shortness of breath upon exertion:    Short of breath when lying flat:    Irregular heart rhythm:        Vascular    Pain in calf, thigh, or hip brought on by ambulation: x   Pain in feet at night that wakes you up from your sleep:     Blood clot in your veins:    Leg swelling:  x       Pulmonary    Oxygen at home:    Productive cough:     Wheezing:         Neurologic    Sudden weakness in arms or legs:     Sudden numbness in arms or legs:     Sudden onset of difficulty speaking or slurred speech:    Temporary loss of vision in one eye:     Problems with dizziness:         Gastrointestinal    Blood in stool:     Vomited blood:         Genitourinary    Burning when urinating:     Blood in urine:        Psychiatric    Major depression:         Hematologic    Bleeding problems:    Problems with blood clotting too easily:  Skin    Rashes or ulcers:        Constitutional    Fever or chills:      PHYSICAL EXAM: Vitals:   11/18/17 1430  BP: (!) 137/55  Pulse: (!) 55  Resp: 18  SpO2: 98%  Weight: 222 lb 9.6 oz  (101 kg)  Height: 6\' 2"  (1.88 m)    GENERAL: The patient is a well-nourished male, in no acute distress. The vital signs are documented above. CARDIOVASCULAR: 2+ radial 2+ femoral 2+ popliteal pulses bilaterally.  He does have 1+ dorsalis pedis pulses bilaterally.  Pitting edema in both lower extremities from his knees down to his feet.  No skin changes of venous hypertension PULMONARY: There is good air exchange  ABDOMEN: Soft and non-tender  MUSCULOSKELETAL: There are no major deformities or cyanosis. NEUROLOGIC: No focal weakness or paresthesias are detected. SKIN: There are no ulcers or rashes noted. PSYCHIATRIC: The patient has a normal affect.  DATA:  He did have a prior arterial study at [facility] line in December 2018 revealing normal ankle arm index bilaterally  Venous reflux study today revealed no evidence of DVT bilaterally.  He had mild reflux in the left common femoral vein and mild in the saphenous vein at the knee.  MEDICAL ISSUES: Long discussion with the patient and his wife.  I do not see any evidence of arterial or venous pathology to his symptoms.  He is a frustrated of seeing multiple specialist and receiving the same answer that everything is normal.  I did explain it is reassuring that there is no risk for limb loss or other major complications related to his vascular risk.  He will see [place] again on an as-needed basis   Rosetta Posner, MD [facility] Tel [phone] Pager [phone]

## 2017-11-19 LAB — BASIC METABOLIC PANEL
BUN / CREAT RATIO: 18 (ref 10–24)
BUN: 24 mg/dL (ref 8–27)
CO2: 23 mmol/L (ref 20–29)
CREATININE: 1.37 mg/dL — AB (ref 0.76–1.27)
Calcium: 9.8 mg/dL (ref 8.6–10.2)
Chloride: 102 mmol/L (ref 96–106)
GFR calc Af Amer: 56 mL/min/{1.73_m2} — ABNORMAL LOW (ref >59)
GFR, EST NON AFRICAN AMERICAN: 48 mL/min/{1.73_m2} — AB (ref >59)
Glucose: 73 mg/dL (ref 65–99)
Potassium: 5.6 mmol/L — ABNORMAL HIGH (ref 3.5–5.2)
SODIUM: 139 mmol/L (ref 134–144)

## 2017-11-20 ENCOUNTER — Telehealth: Payer: Self-pay | Admitting: *Deleted

## 2017-11-20 ENCOUNTER — Encounter: Payer: Self-pay | Admitting: Internal Medicine

## 2017-11-20 DIAGNOSIS — E875 Hyperkalemia: Secondary | ICD-10-CM

## 2017-11-20 NOTE — Telephone Encounter (Signed)
-----   Message from Lelon Perla, MD sent at 11/20/2017  7:22 AM EDT ----- Repeat bmet; low K diet Kirk Ruths

## 2017-11-20 NOTE — Telephone Encounter (Signed)
Advised wife of labs Patient will stop his daily OJ and recheck labs

## 2017-11-24 ENCOUNTER — Ambulatory Visit (INDEPENDENT_AMBULATORY_CARE_PROVIDER_SITE_OTHER)
Admission: RE | Admit: 2017-11-24 | Discharge: 2017-11-24 | Disposition: A | Discharge status: Home / Self Care | Payer: Medicare Other | Source: Ambulatory Visit | Attending: Cardiology | Admitting: Cardiology

## 2017-11-24 ENCOUNTER — Other Ambulatory Visit: Payer: Medicare Other | Admitting: *Deleted

## 2017-11-24 DIAGNOSIS — E875 Hyperkalemia: Secondary | ICD-10-CM

## 2017-11-24 DIAGNOSIS — I712 Thoracic aortic aneurysm, without rupture, unspecified: Secondary | ICD-10-CM

## 2017-11-24 LAB — BASIC METABOLIC PANEL
BUN/Creatinine Ratio: 19 (ref 10–24)
BUN: 25 mg/dL (ref 8–27)
CALCIUM: 9.6 mg/dL (ref 8.6–10.2)
CHLORIDE: 101 mmol/L (ref 96–106)
CO2: 23 mmol/L (ref 20–29)
Creatinine, Ser: 1.34 mg/dL — ABNORMAL HIGH (ref 0.76–1.27)
GFR calc Af Amer: 57 mL/min/{1.73_m2} — ABNORMAL LOW (ref >59)
GFR calc non Af Amer: 49 mL/min/{1.73_m2} — ABNORMAL LOW (ref >59)
GLUCOSE: 59 mg/dL — AB (ref 65–99)
Potassium: 5.2 mmol/L (ref 3.5–5.2)
Sodium: 138 mmol/L (ref 134–144)

## 2017-11-24 MED ORDER — IOPAMIDOL (ISOVUE-370) INJECTION 76%
100.0000 mL | Freq: Once | INTRAVENOUS | Status: AC | PRN
  Administered 2017-11-24: 100 mL via INTRAVENOUS

## 2018-01-18 ENCOUNTER — Other Ambulatory Visit: Payer: Self-pay | Admitting: Cardiology

## 2018-01-18 ENCOUNTER — Other Ambulatory Visit: Payer: Self-pay | Admitting: Internal Medicine

## 2018-02-06 ENCOUNTER — Other Ambulatory Visit: Payer: Self-pay | Admitting: Internal Medicine

## 2018-02-19 ENCOUNTER — Other Ambulatory Visit: Payer: Self-pay | Admitting: Cardiology

## 2018-02-19 ENCOUNTER — Other Ambulatory Visit: Payer: Self-pay | Admitting: Internal Medicine

## 2018-03-17 LAB — HM DIABETES EYE EXAM

## 2018-03-23 ENCOUNTER — Ambulatory Visit: Payer: Medicare Other | Admitting: Internal Medicine

## 2018-03-25 NOTE — Progress Notes (Signed)
HPI: FU CAD, aortic insufficiency, and cerebrovascular disease. A cardiac catheterization in May of 2011 revealed severe three-vessel coronary artery disease and normal LV function. He subsequently underwent coronary artery bypass and graft in May of 2011 (left internal mammary artery to left anterior descending artery, saphenous vein graft to first diagonal, saphenous vein graft obtuse marginal 1, sequential saphenous vein graft to distal right coronary and posterior descending artery). Note an intraoperative transesophageal echocardiogram showed mild to moderate aortic insufficiency and a dilated aortic root. However this was felt not to require surgical intervention at that time. ABIs August 2017 normal.Carotid Dopplers November 2017 showed 1-39% bilateral stenosis.Abd ultrasound 12/17 showed >50 in proximal to mid aorta; no aneurysm. Echocardiogram December 2018 showed normal LV function, mild diastolic dysfunction, mild to moderate aortic insufficiency and mildly dilated ascending aorta.  CTA November 2019 showed 4.4 to 4.5 cm ascending thoracic aortic aneurysm.  Since I last saw him,there is no dyspnea, chest pain, palpitations or syncope.  Current Outpatient Medications  Medication Sig Dispense Refill  . aspirin 81 MG tablet Take 81 mg by mouth daily.      . benazepril (LOTENSIN) 40 MG tablet Take 1 tablet (40 mg total) by mouth daily. 90 tablet 0  . cholecalciferol (VITAMIN D) 1000 UNITS tablet Take 1,000 Units by mouth daily.    Marland Kitchen gabapentin (NEURONTIN) 600 MG tablet Take 1 tablet (600 mg total) by mouth 2 (two) times daily. 180 tablet 0  . ketoconazole (NIZORAL) 2 % cream Apply 1 application topically 2 (two) times daily. 60 g 5  . metFORMIN (GLUCOPHAGE) 850 MG tablet TAKE 1 TABLET BY MOUTH TWICE DAILY WITH A MEAL 180 tablet 0  . propranolol ER (INDERAL LA) 80 MG 24 hr capsule Take 1 capsule (80 mg total) by mouth daily. 90 capsule 0  . rosuvastatin (CRESTOR) 20 MG tablet Take 1  tablet (20 mg total) by mouth daily. 30 tablet 2  . timolol (TIMOPTIC) 0.5 % ophthalmic solution Place 1 drop into both eyes every morning. As directed     No current facility-administered medications for this visit.      Past Medical History:  Diagnosis Date  . AORTIC ANEUR UNSPEC SITE WITHOUT MENTION RUPTURE   . AORTIC INSUFFICIENCY   . BPH (benign prostatic hyperplasia)   . CAD, ARTERY BYPASS GRAFT   . Cancer ([place])   . CEREBROVASCULAR DISEASE   . DIAB W/O COMP TYPE II/UNS NOT STATED UNCNTRL   . Hyperlipidemia   . HYPERTENSION, ESSENTIAL NOS   . NEOPLASM, MALIGNANT, BLADDER, HX OF   . Rotator cuff tear    Left shoulder,     Past Surgical History:  Procedure Laterality Date  . BACK SURGERY  2003   lower  . BLADDER TUMOR EXCISION     Surgery x6, Dr.Tannebaum  . BLEPHAROPLASTY  1980   bilaterally, Dr Gershon Crane  . COLONOSCOPY W/ POLYPECTOMY     Dr Ree Shay [facility]  . CORONARY ARTERY BYPASS GRAFT  2011   x 5  . CYSTOSCOPY  09/02/2012  . INGUINAL HERNIA REPAIR  10/24/2011   Procedure: HERNIA REPAIR INGUINAL ADULT;  Surgeon: Earnstine Regal, MD;  Location: [facility];  Service: General;  Laterality: Right;  Repair Right Inguinal Hernia with Mesh, Umbilical Hernia Repair with Mesh  . LUMBAR EPIDURAL INJECTION  2008  . ROTATOR CUFF REPAIR  2009   right; Dr Shellia Carwin  . SHOULDER ARTHROSCOPY WITH ROTATOR CUFF REPAIR AND SUBACROMIAL DECOMPRESSION  02/14/2014   Right,  SAD/DCR, biceps tenodesis  . UMBILICAL HERNIA REPAIR  10/24/2011   Procedure: HERNIA REPAIR UMBILICAL ADULT;  Surgeon: Earnstine Regal, MD;  Location: [facility];  Service: General;  Laterality: N/A;    Social History   Socioeconomic History  . Marital status: Married    Spouse name: Not on file  . Number of children: 3  . Years of education: Not on file  . Highest education level: Not on file  Occupational History  . Occupation: retired  Scientific laboratory technician  . Financial resource strain: Not on file  . Food insecurity:    Worry:  Not on file    Inability: Not on file  . Transportation needs:    Medical: Not on file    Non-medical: Not on file  Tobacco Use  . Smoking status: Former Smoker    Packs/day: 0.30    Years: 15.00    Pack years: 4.50    Types: Cigarettes    Last attempt to quit: 01/22/1984    Years since quitting: 34.2  . Smokeless tobacco: Never Used  Substance and Sexual Activity  . Alcohol use: No    Alcohol/week: 0.0 standard drinks    Comment: rare, wine   . Drug use: No  . Sexual activity: Not on file  Lifestyle  . Physical activity:    Days per week: Not on file    Minutes per session: Not on file  . Stress: Not on file  Relationships  . Social connections:    Talks on phone: Not on file    Gets together: Not on file    Attends religious service: Not on file    Active member of club or organization: Not on file    Attends meetings of clubs or organizations: Not on file    Relationship status: Not on file  . Intimate partner violence:    Fear of current or ex partner: Not on file    Emotionally abused: Not on file    Physically abused: Not on file    Forced sexual activity: Not on file  Other Topics Concern  . Not on file  Social History Narrative   8 G-children   Married x 56 years   Lives with wife in a one story home.  Has 3 children.     Retired from [employer].  Education: college.     Family History  Problem Relation Age of Onset  . Diabetes Mother   . Tremor Father   . Other Sister   . Stroke Neg Hx   . Heart disease Neg Hx   . Cancer Neg Hx     ROS: no fevers or chills, productive cough, hemoptysis, dysphasia, odynophagia, melena, hematochezia, dysuria, hematuria, rash, seizure activity, orthopnea, PND, pedal edema, claudication. Remaining systems are negative.  Physical Exam: Well-developed well-nourished in no acute distress.  Skin is warm and dry.  HEENT is normal.  Neck is supple.  Chest is clear to auscultation with normal expansion.  Cardiovascular exam is  regular rate and rhythm.  2/6 systolic and diastolic murmur left sternal border. Abdominal exam nontender or distended. No masses palpated. Extremities show trace edema. neuro grossly intact  ECG-sinus rhythm at a rate of 57, first-degree AV block, right bundle branch block.  Personally reviewed  A/P  1 coronary artery disease-patient denies chest pain.  Continue aspirin and statin.  2 thoracic aortic aneurysm-patient will need follow-up CTA November 2020.  3 hypertension-blood pressure is elevated; add hydralazine 25 mg p.o. 3 times daily  and follow blood pressure.  4 hyperlipidemia-continue statin.  Check lipids and liver.  5 aortic insufficiency-we will arrange follow-up echocardiogram.  6 carotid artery disease-mild on most recent Dopplers.  Continue aspirin and statin.  Kirk Ruths, MD

## 2018-03-30 ENCOUNTER — Other Ambulatory Visit: Payer: Self-pay | Admitting: Internal Medicine

## 2018-03-31 ENCOUNTER — Encounter

## 2018-03-31 ENCOUNTER — Ambulatory Visit (INDEPENDENT_AMBULATORY_CARE_PROVIDER_SITE_OTHER): Payer: Medicare Other | Admitting: Cardiology

## 2018-03-31 ENCOUNTER — Encounter: Payer: Self-pay | Admitting: Cardiology

## 2018-03-31 VITALS — BP 168/60 | HR 57 | Ht 74.0 in | Wt 225.4 lb

## 2018-03-31 DIAGNOSIS — I359 Nonrheumatic aortic valve disorder, unspecified: Secondary | ICD-10-CM

## 2018-03-31 DIAGNOSIS — E785 Hyperlipidemia, unspecified: Secondary | ICD-10-CM

## 2018-03-31 DIAGNOSIS — I10 Essential (primary) hypertension: Secondary | ICD-10-CM

## 2018-03-31 LAB — HEPATIC FUNCTION PANEL
ALK PHOS: 50 IU/L (ref 39–117)
ALT: 16 IU/L (ref 0–44)
AST: 18 IU/L (ref 0–40)
Albumin: 4.3 g/dL (ref 3.6–4.6)
Bilirubin Total: 0.3 mg/dL (ref 0.0–1.2)
Bilirubin, Direct: 0.12 mg/dL (ref 0.00–0.40)
Total Protein: 6.4 g/dL (ref 6.0–8.5)

## 2018-03-31 LAB — LIPID PANEL
Chol/HDL Ratio: 3.4 ratio (ref 0.0–5.0)
Cholesterol, Total: 130 mg/dL (ref 100–199)
HDL: 38 mg/dL — ABNORMAL LOW (ref >39)
LDL Calculated: 52 mg/dL (ref 0–99)
Triglycerides: 199 mg/dL — ABNORMAL HIGH (ref 0–149)
VLDL Cholesterol Cal: 40 mg/dL (ref 5–40)

## 2018-03-31 MED ORDER — HYDRALAZINE HCL 25 MG PO TABS: 25.0000 mg | ORAL_TABLET | Freq: Three times a day (TID) | ORAL | 3 refills | Status: DC

## 2018-03-31 NOTE — Patient Instructions (Signed)
Medication Instructions:  START HYDRALAZINE 25 MG ONE TABLET THREE TIMES DAILY If you need a refill on your cardiac medications before your next appointment, please call your pharmacy.   Lab work: Your physician recommends that you HAVE LAB WORK TODAY If you have labs (blood work) drawn today and your tests are completely normal, you will receive your results only by: Marland Kitchen MyChart Message (if you have MyChart) OR . A paper copy in the mail If you have any lab test that is abnormal or we need to change your treatment, we will call you to review the results.  Testing/Procedures: Your physician has requested that you have an echocardiogram. Echocardiography is a painless test that uses sound waves to create images of your heart. It provides your doctor with information about the size and shape of your heart and how well your heart's chambers and valves are working. This procedure takes approximately one hour. There are no restrictions for this procedure.  [place]  Follow-Up: At [facility], you and your health needs are our priority.  As part of our continuing mission to provide you with exceptional heart care, we have created designated Provider Care Teams.  These Care Teams include your primary Cardiologist (physician) and Advanced Practice Providers (APPs -  Physician Assistants and Nurse Practitioners) who all work together to provide you with the care you need, when you need it. You will need a follow up appointment in 12 months.  Please call our office 2 months in advance to schedule this appointment.  You may see Kirk Ruths MD or one of the following Advanced Practice Providers on your designated Care Team:   Kerin Ransom, PA-C Roby Lofts, [place] . Sande Rives, PA-C

## 2018-04-01 ENCOUNTER — Other Ambulatory Visit: Payer: Self-pay | Admitting: *Deleted

## 2018-04-01 ENCOUNTER — Encounter: Payer: Self-pay | Admitting: *Deleted

## 2018-04-01 MED ORDER — ROSUVASTATIN CALCIUM 20 MG PO TABS: 20.0000 mg | ORAL_TABLET | Freq: Every day | ORAL | 3 refills | Status: DC

## 2018-04-08 ENCOUNTER — Other Ambulatory Visit (HOSPITAL_COMMUNITY): Payer: Medicare Other

## 2018-04-24 ENCOUNTER — Telehealth: Payer: Self-pay | Admitting: Internal Medicine

## 2018-04-24 NOTE — Telephone Encounter (Signed)
Re: echocardiogram scheduled 04/29/2018  I spoke to the patient and family at 12:33 pm to postpone echocardiogram ordered to reassess mild-moderate aortic regurgitation with normal LV size on previous exam. Patient is feeling well, and appreciated the call. He and family are agreeable to rescheduling this for 8 weeks during COVID-19 pandemic to limit our patients' risk to virus exposure.   [place] Mor: please reschedule echo for 8 weeks.

## 2018-04-27 ENCOUNTER — Other Ambulatory Visit: Payer: Self-pay | Admitting: Internal Medicine

## 2018-04-29 ENCOUNTER — Ambulatory Visit (HOSPITAL_COMMUNITY): Payer: Medicare Other

## 2018-05-04 ENCOUNTER — Telehealth: Payer: Self-pay

## 2018-05-04 NOTE — Telephone Encounter (Signed)
Overdue for visit. William Faulkner- can you call Pt and offer virtual visit please?

## 2018-05-05 NOTE — Telephone Encounter (Signed)
Spoke with wife Dianah Field (spouse on [place]) and spouse stated pt was not available but will call the office to schedule VOV fu appt with Dr Larose Kells.

## 2018-05-14 ENCOUNTER — Other Ambulatory Visit: Payer: Self-pay | Admitting: Internal Medicine

## 2018-05-18 ENCOUNTER — Other Ambulatory Visit: Payer: Self-pay

## 2018-05-18 ENCOUNTER — Ambulatory Visit (INDEPENDENT_AMBULATORY_CARE_PROVIDER_SITE_OTHER): Payer: Medicare Other | Admitting: Internal Medicine

## 2018-05-18 DIAGNOSIS — I10 Essential (primary) hypertension: Secondary | ICD-10-CM | POA: Diagnosis not present

## 2018-05-18 DIAGNOSIS — G629 Polyneuropathy, unspecified: Secondary | ICD-10-CM

## 2018-05-18 DIAGNOSIS — M79605 Pain in left leg: Secondary | ICD-10-CM

## 2018-05-18 DIAGNOSIS — E1140 Type 2 diabetes mellitus with diabetic neuropathy, unspecified: Secondary | ICD-10-CM | POA: Diagnosis not present

## 2018-05-18 DIAGNOSIS — M79604 Pain in right leg: Secondary | ICD-10-CM | POA: Diagnosis not present

## 2018-05-18 NOTE — Progress Notes (Signed)
Subjective:    Patient ID: William Faulkner, male    DOB: Jan 31, 1934, 84 y.o.   MRN: [id number]  DOS:  05/18/2018 Type of visit - description: Virtual Visit via Video Note  I connected with@ on 05/18/18 at  1:40 PM EDT by a video enabled telemedicine application and verified that I am speaking with the correct person using two identifiers.   THIS ENCOUNTER IS A VIRTUAL VISIT DUE TO COVID-19 - PATIENT WAS NOT SEEN IN THE OFFICE. PATIENT HAS CONSENTED TO VIRTUAL VISIT / TELEMEDICINE VISIT   Location of patient: home  Location of provider: office  I discussed the limitations of evaluation and management by telemedicine and the availability of in person appointments. The patient expressed understanding and agreed to proceed.  History of Present Illness: Routine follow-up No new symptoms, aches and pains as well as neuropathy are at baseline. We reviewed his ambulatory BPs and CBGs. I reviewed the last cardiology note.  COVID-19: Good compliance with precautions  Review of Systems Denies fever chills No chest pain No cough No dysuria, gross hematuria difficulty urinating Past Medical History:  Diagnosis Date  . AORTIC ANEUR UNSPEC SITE WITHOUT MENTION RUPTURE   . AORTIC INSUFFICIENCY   . BPH (benign prostatic hyperplasia)   . CAD, ARTERY BYPASS GRAFT   . Cancer ([place])   . CEREBROVASCULAR DISEASE   . DIAB W/O COMP TYPE II/UNS NOT STATED UNCNTRL   . Hyperlipidemia   . HYPERTENSION, ESSENTIAL NOS   . NEOPLASM, MALIGNANT, BLADDER, HX OF   . Rotator cuff tear    Left shoulder,     Past Surgical History:  Procedure Laterality Date  . BACK SURGERY  2003   lower  . BLADDER TUMOR EXCISION     Surgery x6, Dr.Tannebaum  . BLEPHAROPLASTY  1980   bilaterally, Dr Gershon Crane  . COLONOSCOPY W/ POLYPECTOMY     Dr Ree Shay [facility]  . CORONARY ARTERY BYPASS GRAFT  2011   x 5  . CYSTOSCOPY  09/02/2012  . INGUINAL HERNIA REPAIR  10/24/2011   Procedure: HERNIA REPAIR INGUINAL ADULT;   Surgeon: Earnstine Regal, MD;  Location: [facility];  Service: General;  Laterality: Right;  Repair Right Inguinal Hernia with Mesh, Umbilical Hernia Repair with Mesh  . LUMBAR EPIDURAL INJECTION  2008  . ROTATOR CUFF REPAIR  2009   right; Dr Shellia Carwin  . SHOULDER ARTHROSCOPY WITH ROTATOR CUFF REPAIR AND SUBACROMIAL DECOMPRESSION  02/14/2014   Right, SAD/DCR, biceps tenodesis  . UMBILICAL HERNIA REPAIR  10/24/2011   Procedure: HERNIA REPAIR UMBILICAL ADULT;  Surgeon: Earnstine Regal, MD;  Location: [facility];  Service: General;  Laterality: N/A;    Social History   Socioeconomic History  . Marital status: Married    Spouse name: Not on file  . Number of children: 3  . Years of education: Not on file  . Highest education level: Not on file  Occupational History  . Occupation: retired  Scientific laboratory technician  . Financial resource strain: Not on file  . Food insecurity:    Worry: Not on file    Inability: Not on file  . Transportation needs:    Medical: Not on file    Non-medical: Not on file  Tobacco Use  . Smoking status: Former Smoker    Packs/day: 0.30    Years: 15.00    Pack years: 4.50    Types: Cigarettes    Last attempt to quit: 01/22/1984    Years since quitting: 34.3  . Smokeless  tobacco: Never Used  Substance and Sexual Activity  . Alcohol use: No    Alcohol/week: 0.0 standard drinks    Comment: rare, wine   . Drug use: No  . Sexual activity: Not on file  Lifestyle  . Physical activity:    Days per week: Not on file    Minutes per session: Not on file  . Stress: Not on file  Relationships  . Social connections:    Talks on phone: Not on file    Gets together: Not on file    Attends religious service: Not on file    Active member of club or organization: Not on file    Attends meetings of clubs or organizations: Not on file    Relationship status: Not on file  . Intimate partner violence:    Fear of current or ex partner: Not on file    Emotionally abused: Not on file     Physically abused: Not on file    Forced sexual activity: Not on file  Other Topics Concern  . Not on file  Social History Narrative   8 G-children   Married x 56 years   Lives with wife in a one story home.  Has 3 children.     Retired from [employer].  Education: college.       Allergies as of 05/18/2018      Reactions   Atorvastatin Other (See Comments)   REACTION: increased cpk   Celecoxib Other (See Comments)   REACTION: palpitations= celebrex   Sulfonamide Derivatives Other (See Comments)   Unknown   Zolpidem Tartrate Other (See Comments)   REACTION: made patient feel weird= ambien      Medication List       Accurate as of May 18, 2018  1:46 PM. Always use your most recent med list.        aspirin 81 MG tablet Take 81 mg by mouth daily.   benazepril 40 MG tablet Commonly known as:  LOTENSIN Take 1 tablet (40 mg total) by mouth daily.   cholecalciferol 1000 units tablet Commonly known as:  VITAMIN D Take 1,000 Units by mouth daily.   gabapentin 600 MG tablet Commonly known as:  NEURONTIN Take 1 tablet (600 mg total) by mouth 2 (two) times daily.   hydrALAZINE 25 MG tablet Commonly known as:  APRESOLINE Take 1 tablet (25 mg total) by mouth 3 (three) times daily.   ketoconazole 2 % cream Commonly known as:  NIZORAL Apply 1 application topically 2 (two) times daily.   metFORMIN 850 MG tablet Commonly known as:  GLUCOPHAGE TAKE 1 TABLET BY MOUTH TWICE DAILY WITH A MEAL   propranolol ER 80 MG 24 hr capsule Commonly known as:  INDERAL LA Take 1 capsule (80 mg total) by mouth daily.   rosuvastatin 20 MG tablet Commonly known as:  CRESTOR Take 1 tablet (20 mg total) by mouth daily.   timolol 0.5 % ophthalmic solution Commonly known as:  TIMOPTIC Place 1 drop into both eyes every morning. As directed           Objective:   Physical Exam There were no vitals taken for this visit. This is a video conference, alert oriented x3, no apparent distress,  in good spirits    Assessment    Assessment Diabetes HTN Hyperlipidemia Pain management: *Neuropathy (painful type):  ---[id number]: Normal W29, folic acid, RPR. CK slightly elevated. NCS severe polyneuropathy ---Re-eval by neuro 06-2016: exam c/w neuropathy. Labs (-) ---intolerant  to lyrica ; intolerant to Elavil 04-2017 *MSK: h/o back surgery 2003 *Claudication:   Unremarkable ABIs 2017, back MRI 02-2016 (spinal stenosis not mention on the  report), saw Dr. Rita Ohara, symptoms not neurogenic.  Probably small vessel disease PVD. ABIs neg again 12-2016 CV: -CAD, CABG -Aortic insufficiency, dilated aortic root -Carotid artery disease 40-59% 10-2014, [place] 11-2015 : 1-39%, next per cards  -Ascending aortic aneurysm, last CT chest 10-2015 GU: BPH, bladder cancer Thyroid nodules: Small per [place]  03-2014 Cholelithiasis by CT 01-2014 Preglaucoma  PLAN: DM: On metformin, ambulatory CBGs in the 120s, check a A1c HTN: On Lotensin, recently cardiology added hydralazine, ambulatory CBGs 120, 130.  Check a BMP Hyperlipidemia: On Crestor, last FLP satisfactory CAD, aortic insufficiency, aortic aneurysm per cardiology.  See last note. Neuropathy, leg pain: At baseline. Fatigue: Patient reports chronic fatigue, he is at baseline, he seems in good spirits.  Recommend observation BPH, bladder cancer: Asymptomatic, no recent urology visit Plan: BMP, A1c this week RTC 4 to 5 months face-to-face if possible.    I discussed the assessment and treatment plan with the patient. The patient was provided an opportunity to ask questions and all were answered. The patient agreed with the plan and demonstrated an understanding of the instructions.   The patient was advised to call back or seek an in-person evaluation if the symptoms worsen or if the condition fails to improve as anticipated.

## 2018-05-19 NOTE — Assessment & Plan Note (Signed)
DM: On metformin, ambulatory CBGs in the 120s, check a A1c HTN: On Lotensin, recently cardiology added hydralazine, ambulatory CBGs 120, 130.  Check a BMP Hyperlipidemia: On Crestor, last FLP satisfactory CAD, aortic insufficiency, aortic aneurysm per cardiology.  See last note. Neuropathy, leg pain: At baseline. Fatigue: Patient reports chronic fatigue, he is at baseline, he seems in good spirits.  Recommend observation BPH, bladder cancer: Asymptomatic, no recent urology visit Plan: BMP, A1c this week RTC 4 to 5 months face-to-face if possible.

## 2018-05-20 ENCOUNTER — Other Ambulatory Visit: Payer: Self-pay

## 2018-05-20 ENCOUNTER — Other Ambulatory Visit (INDEPENDENT_AMBULATORY_CARE_PROVIDER_SITE_OTHER): Payer: Medicare Other

## 2018-05-20 DIAGNOSIS — I10 Essential (primary) hypertension: Secondary | ICD-10-CM

## 2018-05-20 DIAGNOSIS — E1140 Type 2 diabetes mellitus with diabetic neuropathy, unspecified: Secondary | ICD-10-CM

## 2018-05-20 LAB — BASIC METABOLIC PANEL
BUN: 27 mg/dL — ABNORMAL HIGH (ref 6–23)
CO2: 30 mEq/L (ref 19–32)
Calcium: 10 mg/dL (ref 8.4–10.5)
Chloride: 104 mEq/L (ref 96–112)
Creatinine, Ser: 1.31 mg/dL (ref 0.40–1.50)
GFR: 52.39 mL/min — ABNORMAL LOW (ref >60.00)
Glucose, Bld: 82 mg/dL (ref 70–99)
Potassium: 5 mEq/L (ref 3.5–5.1)
Sodium: 139 mEq/L (ref 135–145)

## 2018-05-20 LAB — HEMOGLOBIN A1C: Hgb A1c MFr Bld: 6.7 % — ABNORMAL HIGH (ref 4.6–6.5)

## 2018-05-20 MED ORDER — METFORMIN HCL 850 MG PO TABS: ORAL_TABLET | ORAL | 1 refills | Status: DC

## 2018-05-20 MED ORDER — BENAZEPRIL HCL 40 MG PO TABS: 40.0000 mg | ORAL_TABLET | Freq: Every day | ORAL | 1 refills | Status: DC

## 2018-05-20 NOTE — Addendum Note (Signed)
Addended byDamita Dunnings D on: 05/20/2018 03:37 PM   Modules accepted: Orders

## 2018-05-28 ENCOUNTER — Other Ambulatory Visit: Payer: Self-pay | Admitting: Internal Medicine

## 2018-06-16 ENCOUNTER — Telehealth (HOSPITAL_COMMUNITY): Payer: Self-pay | Admitting: Radiology

## 2018-06-16 NOTE — Telephone Encounter (Signed)

## 2018-06-17 ENCOUNTER — Other Ambulatory Visit: Payer: Self-pay

## 2018-06-17 ENCOUNTER — Ambulatory Visit (HOSPITAL_COMMUNITY): Payer: Medicare Other | Attending: Internal Medicine

## 2018-06-17 DIAGNOSIS — I359 Nonrheumatic aortic valve disorder, unspecified: Secondary | ICD-10-CM | POA: Diagnosis not present

## 2018-07-21 ENCOUNTER — Other Ambulatory Visit: Payer: Self-pay | Admitting: Internal Medicine

## 2018-09-14 ENCOUNTER — Other Ambulatory Visit: Payer: Self-pay

## 2018-09-15 ENCOUNTER — Ambulatory Visit (INDEPENDENT_AMBULATORY_CARE_PROVIDER_SITE_OTHER): Payer: Medicare Other | Admitting: Internal Medicine

## 2018-09-15 ENCOUNTER — Encounter: Payer: Self-pay | Admitting: Internal Medicine

## 2018-09-15 VITALS — BP 184/47 | HR 55 | Temp 97.8°F | Resp 18 | Ht 74.0 in | Wt 222.4 lb

## 2018-09-15 DIAGNOSIS — I10 Essential (primary) hypertension: Secondary | ICD-10-CM | POA: Diagnosis not present

## 2018-09-15 DIAGNOSIS — G629 Polyneuropathy, unspecified: Secondary | ICD-10-CM | POA: Diagnosis not present

## 2018-09-15 DIAGNOSIS — R600 Localized edema: Secondary | ICD-10-CM

## 2018-09-15 MED ORDER — ETHACRYNIC ACID 25 MG PO TABS: 25.0000 mg | ORAL_TABLET | Freq: Every day | ORAL | 0 refills | Status: DC

## 2018-09-15 MED ORDER — GABAPENTIN 600 MG PO TABS: 600.0000 mg | ORAL_TABLET | Freq: Three times a day (TID) | ORAL | 1 refills | Status: DC

## 2018-09-15 NOTE — Progress Notes (Signed)
Subjective:    Patient ID: William Faulkner, male    DOB: [birth year], 84 y.o.   MRN: [id number]  DOS:  09/15/2018 Type of visit - description: Routine visit Continue with neuropathy, symptoms at baseline.  Report imbalance. HTN: BPs when check here and at other clinics is elevated, at home reportedly in the 120s. Not taking hydralazine as prescribed.   BP Readings from Last 3 Encounters:  09/15/18 (!) 184/47  03/31/18 (!) 168/60  11/18/17 (!) 137/55     Review of Systems Denies chest pain or difficulty breathing Has lower extremity edema, at baseline No recent fall or injuries No nausea, vomiting, diarrhea  Past Medical History:  Diagnosis Date  . AORTIC ANEUR UNSPEC SITE WITHOUT MENTION RUPTURE   . AORTIC INSUFFICIENCY   . BPH (benign prostatic hyperplasia)   . CAD, ARTERY BYPASS GRAFT   . Cancer ([place])   . CEREBROVASCULAR DISEASE   . DIAB W/O COMP TYPE II/UNS NOT STATED UNCNTRL   . Hyperlipidemia   . HYPERTENSION, ESSENTIAL NOS   . NEOPLASM, MALIGNANT, BLADDER, HX OF   . Rotator cuff tear    Left shoulder,     Past Surgical History:  Procedure Laterality Date  . BACK SURGERY  2003   lower  . BLADDER TUMOR EXCISION     Surgery x6, Dr.Tannebaum  . BLEPHAROPLASTY  1980   bilaterally, Dr Gershon Crane  . COLONOSCOPY W/ POLYPECTOMY     Dr Ree Shay [facility]  . CORONARY ARTERY BYPASS GRAFT  2011   x 5  . CYSTOSCOPY  09/02/2012  . INGUINAL HERNIA REPAIR  10/24/2011   Procedure: HERNIA REPAIR INGUINAL ADULT;  Surgeon: Earnstine Regal, MD;  Location: [facility];  Service: General;  Laterality: Right;  Repair Right Inguinal Hernia with Mesh, Umbilical Hernia Repair with Mesh  . LUMBAR EPIDURAL INJECTION  2008  . ROTATOR CUFF REPAIR  2009   right; Dr Shellia Carwin  . SHOULDER ARTHROSCOPY WITH ROTATOR CUFF REPAIR AND SUBACROMIAL DECOMPRESSION  02/14/2014   Right, SAD/DCR, biceps tenodesis  . UMBILICAL HERNIA REPAIR  10/24/2011   Procedure: HERNIA REPAIR UMBILICAL ADULT;  Surgeon: Earnstine Regal, MD;  Location: [facility];  Service: General;  Laterality: N/A;    Social History   Socioeconomic History  . Marital status: Married    Spouse name: Not on file  . Number of children: 3  . Years of education: Not on file  . Highest education level: Not on file  Occupational History  . Occupation: retired  Scientific laboratory technician  . Financial resource strain: Not on file  . Food insecurity    Worry: Not on file    Inability: Not on file  . Transportation needs    Medical: Not on file    Non-medical: Not on file  Tobacco Use  . Smoking status: Former Smoker    Packs/day: 0.30    Years: 15.00    Pack years: 4.50    Types: Cigarettes    Quit date: 01/22/1984    Years since quitting: 34.6  . Smokeless tobacco: Never Used  Substance and Sexual Activity  . Alcohol use: No    Alcohol/week: 0.0 standard drinks    Comment: rare, wine   . Drug use: No  . Sexual activity: Not on file  Lifestyle  . Physical activity    Days per week: Not on file    Minutes per session: Not on file  . Stress: Not on file  Relationships  . Social connections  Talks on phone: Not on file    Gets together: Not on file    Attends religious service: Not on file    Active member of club or organization: Not on file    Attends meetings of clubs or organizations: Not on file    Relationship status: Not on file  . Intimate partner violence    Fear of current or ex partner: Not on file    Emotionally abused: Not on file    Physically abused: Not on file    Forced sexual activity: Not on file  Other Topics Concern  . Not on file  Social History Narrative   8 G-children   Married x 56 years   Lives with wife in a one story home.  Has 3 children.     Retired from [employer].  Education: college.       Allergies as of 09/15/2018      Reactions   Atorvastatin Other (See Comments)   REACTION: increased cpk   Celecoxib Other (See Comments)   REACTION: palpitations= celebrex   Sulfonamide Derivatives Other  (See Comments)   Unknown   Zolpidem Tartrate Other (See Comments)   REACTION: made patient feel weird= ambien      Medication List       Accurate as of September 15, 2018 11:59 PM. If you have any questions, ask your nurse or doctor.        aspirin 81 MG tablet Take 81 mg by mouth daily.   benazepril 40 MG tablet Commonly known as: LOTENSIN Take 1 tablet (40 mg total) by mouth daily.   cholecalciferol 1000 units tablet Commonly known as: VITAMIN D Take 1,000 Units by mouth daily.   ethacrynic acid 25 MG tablet Commonly known as: Edecrin Take 1 tablet (25 mg total) by mouth daily. Started by: Kathlene November, MD   gabapentin 600 MG tablet Commonly known as: NEURONTIN Take 1 tablet (600 mg total) by mouth 3 (three) times daily. What changed: when to take this Changed by: Kathlene November, MD   hydrALAZINE 25 MG tablet Commonly known as: APRESOLINE Take 1 tablet (25 mg total) by mouth 3 (three) times daily.   ketoconazole 2 % cream Commonly known as: NIZORAL Apply 1 application topically 2 (two) times daily.   metFORMIN 850 MG tablet Commonly known as: GLUCOPHAGE TAKE 1 TABLET BY MOUTH TWICE DAILY WITH A MEAL   propranolol ER 80 MG 24 hr capsule Commonly known as: INDERAL LA Take 1 capsule (80 mg total) by mouth daily.   rosuvastatin 20 MG tablet Commonly known as: CRESTOR Take 1 tablet (20 mg total) by mouth daily.   timolol 0.5 % ophthalmic solution Commonly known as: TIMOPTIC Place 1 drop into both eyes every morning. As directed           Objective:   Physical Exam BP (!) 184/47 (BP Location: Left Arm, Patient Position: Sitting, Cuff Size: Normal)   Pulse (!) 55   Temp 97.8 F (36.6 C) (Temporal)   Resp 18   Ht 6\' 2"  (1.88 m)   Wt 222 lb 6 oz (100.9 kg)   SpO2 99%   BMI 28.55 kg/m  General:   Well developed, NAD, BMI noted. HEENT:  Normocephalic . Face symmetric, atraumatic Lungs:  CTA B Normal respiratory effort, no intercostal retractions, no  accessory muscle use. Heart: RRR, + murmur diastolic, very mild   +/+++ pretibial edema bilaterally, slightly more noticeable around the ankles Skin: Not pale. Not jaundice Neurologic:  alert & oriented X3.  Speech normal, gait appropriate for age and unassisted Psych--  Cognition and judgment appear intact.  Cooperative with normal attention span and concentration.  Behavior appropriate. No anxious or depressed appearing.      Assessment     Assessment Diabetes HTN Hyperlipidemia Pain management: *Neuropathy (painful type):  ---[id number]: Normal 123456, folic acid, RPR. CK slightly elevated. NCS severe polyneuropathy ---Re-eval by neuro 06-2016: exam c/w neuropathy. Labs (-) ---intolerant  to lyrica ; intolerant to Elavil 04-2017 *MSK: h/o back surgery 2003 *Claudication:   Unremarkable ABIs 2017, back MRI 02-2016 (spinal stenosis not mention on the  report), saw Dr. Rita Ohara, symptoms not neurogenic.  Probably small vessel disease PVD. ABIs neg again 12-2016 CV: -CAD, CABG -Aortic insufficiency, dilated aortic root -Carotid artery disease 40-59% 10-2014, [place] 11-2015 : 1-39%, next per cards  -Ascending aortic aneurysm, last CT chest 10-2015 GU: BPH, bladder cancer Thyroid nodules: Small per [place]  03-2014 Cholelithiasis by CT 01-2014 Preglaucoma  PLAN: DM: Recheck A1c on RTC HTN: Currently on Lotensin, cardiology added hydralazine in March 2020, he is only taking it twice a day. BP reportedly well controlled at home but is consistently elevated at the office. After extensive chart research I recommend the following: Continue Lotensin, do take hydralazine 3 times a day, add Edecrin 25 mg (allergic to sulfa, avoid Lasix).  I anticipate this will help with his blood pressure and lower extremity edema.  Reevaluate in 2 weeks. Neuropathy: Ongoing issue, he is looking desperately for some help, I recommend to increase gabapentin 600 mg to 3 times a day. I am concerned about his balance,  likely due to neuropathy, strongly recommend the use of a cane. RTC 2 weeks  Today, I spent more than 29 min with the patient: >50% of the time counseling regards neuropathy on hypertension.  Adjusting his BP meds, reviewing the chart regards previous assessment of neuropathy, listening to his concerns.

## 2018-09-15 NOTE — Progress Notes (Signed)
Pre visit review using our clinic review tool, if applicable. No additional management support is needed unless otherwise documented below in the visit note. 

## 2018-09-15 NOTE — Patient Instructions (Addendum)
Please schedule Medicare Wellness with Glenard Haring.   Come back in 2 weeks, please make an appointment  Gabapentin: 3 times a day  Hydralazine: 3 times a day  New medication: Ethacrynic acid 1 tablet in the morning  Check your blood pressures and write a log, bring the log with you     BP GOAL is between 110/65 and  135/85. If it is consistently higher or lower, let me know

## 2018-09-16 NOTE — Assessment & Plan Note (Signed)
DM: Recheck A1c on RTC HTN: Currently on Lotensin, cardiology added hydralazine in March 2020, he is only taking it twice a day. BP reportedly well controlled at home but is consistently elevated at the office. After extensive chart research I recommend the following: Continue Lotensin, do take hydralazine 3 times a day, add Edecrin 25 mg (allergic to sulfa, avoid Lasix).  I anticipate this will help with his blood pressure and lower extremity edema.  Reevaluate in 2 weeks. Neuropathy: Ongoing issue, he is looking desperately for some help, I recommend to increase gabapentin 600 mg to 3 times a day. I am concerned about his balance, likely due to neuropathy, strongly recommend the use of a cane. RTC 2 weeks

## 2018-10-02 ENCOUNTER — Other Ambulatory Visit: Payer: Self-pay

## 2018-10-05 ENCOUNTER — Ambulatory Visit (INDEPENDENT_AMBULATORY_CARE_PROVIDER_SITE_OTHER): Payer: Medicare Other | Admitting: Internal Medicine

## 2018-10-05 ENCOUNTER — Encounter: Payer: Self-pay | Admitting: Internal Medicine

## 2018-10-05 ENCOUNTER — Other Ambulatory Visit: Payer: Self-pay

## 2018-10-05 VITALS — BP 160/46 | HR 59 | Temp 96.5°F | Resp 16 | Ht 74.0 in | Wt 221.5 lb

## 2018-10-05 DIAGNOSIS — E1140 Type 2 diabetes mellitus with diabetic neuropathy, unspecified: Secondary | ICD-10-CM

## 2018-10-05 DIAGNOSIS — R21 Rash and other nonspecific skin eruption: Secondary | ICD-10-CM

## 2018-10-05 DIAGNOSIS — I10 Essential (primary) hypertension: Secondary | ICD-10-CM

## 2018-10-05 DIAGNOSIS — E785 Hyperlipidemia, unspecified: Secondary | ICD-10-CM

## 2018-10-05 DIAGNOSIS — E875 Hyperkalemia: Secondary | ICD-10-CM

## 2018-10-05 DIAGNOSIS — G629 Polyneuropathy, unspecified: Secondary | ICD-10-CM

## 2018-10-05 LAB — BASIC METABOLIC PANEL
BUN: 38 mg/dL — ABNORMAL HIGH (ref 6–23)
CO2: 28 mEq/L (ref 19–32)
Calcium: 9.8 mg/dL (ref 8.4–10.5)
Chloride: 104 mEq/L (ref 96–112)
Creatinine, Ser: 1.63 mg/dL — ABNORMAL HIGH (ref 0.40–1.50)
GFR: 40.67 mL/min — ABNORMAL LOW (ref >60.00)
Glucose, Bld: 70 mg/dL (ref 70–99)
Potassium: 5.4 mEq/L — ABNORMAL HIGH (ref 3.5–5.1)
Sodium: 140 mEq/L (ref 135–145)

## 2018-10-05 LAB — HEMOGLOBIN A1C: Hgb A1c MFr Bld: 6.6 % — ABNORMAL HIGH (ref 4.6–6.5)

## 2018-10-05 LAB — CBC WITH DIFFERENTIAL/PLATELET
Basophils Absolute: 0 10*3/uL (ref 0.0–0.1)
Basophils Relative: 0.6 % (ref 0.0–3.0)
Eosinophils Absolute: 0.4 10*3/uL (ref 0.0–0.7)
Eosinophils Relative: 5.1 % — ABNORMAL HIGH (ref 0.0–5.0)
HCT: 40.3 % (ref 39.0–52.0)
Hemoglobin: 13 g/dL (ref 13.0–17.0)
Lymphocytes Relative: 25.9 % (ref 12.0–46.0)
Lymphs Abs: 1.8 10*3/uL (ref 0.7–4.0)
MCHC: 32.3 g/dL (ref 30.0–36.0)
MCV: 87.4 fl (ref 78.0–100.0)
Monocytes Absolute: 0.7 10*3/uL (ref 0.1–1.0)
Monocytes Relative: 10 % (ref 3.0–12.0)
Neutro Abs: 4 10*3/uL (ref 1.4–7.7)
Neutrophils Relative %: 58.4 % (ref 43.0–77.0)
Platelets: 215 10*3/uL (ref 150.0–400.0)
RBC: 4.61 Mil/uL (ref 4.22–5.81)
RDW: 13.8 % (ref 11.5–15.5)
WBC: 6.9 10*3/uL (ref 4.0–10.5)

## 2018-10-05 MED ORDER — GABAPENTIN 600 MG PO TABS: 600.0000 mg | ORAL_TABLET | Freq: Four times a day (QID) | ORAL | 3 refills | Status: DC

## 2018-10-05 NOTE — Patient Instructions (Addendum)
GO TO THE LAB : Get the blood work     GO TO THE FRONT DESK Schedule your next appointment for a physical exam in 5 months  Increase gabapentin 600 mg to 1 tablet 4 times a day  You may like to try CAPZASIN is a over-the-counter  cream to help with neuropathy   Continue checking your blood pressures and blood sugars

## 2018-10-05 NOTE — Progress Notes (Signed)
Pre visit review using our clinic review tool, if applicable. No additional management support is needed unless otherwise documented below in the visit note. 

## 2018-10-05 NOTE — Progress Notes (Signed)
Subjective:    Patient ID: William Faulkner, male    DOB: Aug 05, 1934, 84 y.o.   MRN: [id number]  DOS:  10/05/2018 Type of visit - description: Routine visit Multiple concerns HTN, edecrine is working, good ambulatory BPs but is very expensive. DM, ambulatory CBGs reviewed Neuropathy, see last visit, gabapentin dose increased, it is helping a little .  Still have symptoms mostly in the morning. Also several months history of a rash at the right chest, no itching, using OTC Neosporin   Review of Systems   Past Medical History:  Diagnosis Date  . AORTIC ANEUR UNSPEC SITE WITHOUT MENTION RUPTURE   . AORTIC INSUFFICIENCY   . BPH (benign prostatic hyperplasia)   . CAD, ARTERY BYPASS GRAFT   . Cancer ([place])   . CEREBROVASCULAR DISEASE   . DIAB W/O COMP TYPE II/UNS NOT STATED UNCNTRL   . Hyperlipidemia   . HYPERTENSION, ESSENTIAL NOS   . NEOPLASM, MALIGNANT, BLADDER, HX OF   . Rotator cuff tear    Left shoulder,     Past Surgical History:  Procedure Laterality Date  . BACK SURGERY  2003   lower  . BLADDER TUMOR EXCISION     Surgery x6, Dr.Tannebaum  . BLEPHAROPLASTY  1980   bilaterally, Dr Gershon Crane  . COLONOSCOPY W/ POLYPECTOMY     Dr Ree Shay GI  . CORONARY ARTERY BYPASS GRAFT  2011   x 5  . CYSTOSCOPY  09/02/2012  . INGUINAL HERNIA REPAIR  10/24/2011   Procedure: HERNIA REPAIR INGUINAL ADULT;  Surgeon: Earnstine Regal, MD;  Location: [facility];  Service: General;  Laterality: Right;  Repair Right Inguinal Hernia with Mesh, Umbilical Hernia Repair with Mesh  . LUMBAR EPIDURAL INJECTION  2008  . ROTATOR CUFF REPAIR  2009   right; Dr Shellia Carwin  . SHOULDER ARTHROSCOPY WITH ROTATOR CUFF REPAIR AND SUBACROMIAL DECOMPRESSION  02/14/2014   Right, SAD/DCR, biceps tenodesis  . UMBILICAL HERNIA REPAIR  10/24/2011   Procedure: HERNIA REPAIR UMBILICAL ADULT;  Surgeon: Earnstine Regal, MD;  Location: [facility];  Service: General;  Laterality: N/A;    Social History   Socioeconomic  History  . Marital status: Married    Spouse name: Not on file  . Number of children: 3  . Years of education: Not on file  . Highest education level: Not on file  Occupational History  . Occupation: retired  Scientific laboratory technician  . Financial resource strain: Not on file  . Food insecurity    Worry: Not on file    Inability: Not on file  . Transportation needs    Medical: Not on file    Non-medical: Not on file  Tobacco Use  . Smoking status: Former Smoker    Packs/day: 0.30    Years: 15.00    Pack years: 4.50    Types: Cigarettes    Quit date: 01/22/1984    Years since quitting: 34.7  . Smokeless tobacco: Never Used  Substance and Sexual Activity  . Alcohol use: No    Alcohol/week: 0.0 standard drinks    Comment: rare, wine   . Drug use: No  . Sexual activity: Not on file  Lifestyle  . Physical activity    Days per week: Not on file    Minutes per session: Not on file  . Stress: Not on file  Relationships  . Social Herbalist on phone: Not on file    Gets together: Not on file    Attends  religious service: Not on file    Active member of club or organization: Not on file    Attends meetings of clubs or organizations: Not on file    Relationship status: Not on file  . Intimate partner violence    Fear of current or ex partner: Not on file    Emotionally abused: Not on file    Physically abused: Not on file    Forced sexual activity: Not on file  Other Topics Concern  . Not on file  Social History Narrative   8 G-children   Married x 56 years   Lives with wife in a one story home.  Has 3 children.     Retired from [employer].  Education: college.       Allergies as of 10/05/2018      Reactions   Atorvastatin Other (See Comments)   REACTION: increased cpk   Celecoxib Other (See Comments)   REACTION: palpitations= celebrex   Sulfonamide Derivatives Other (See Comments)   Unknown   Zolpidem Tartrate Other (See Comments)   REACTION: made patient feel weird=  ambien      Medication List       Accurate as of October 05, 2018 10:33 AM. If you have any questions, ask your nurse or doctor.        aspirin 81 MG tablet Take 81 mg by mouth daily.   benazepril 40 MG tablet Commonly known as: LOTENSIN Take 1 tablet (40 mg total) by mouth daily.   cholecalciferol 1000 units tablet Commonly known as: VITAMIN D Take 1,000 Units by mouth daily.   ethacrynic acid 25 MG tablet Commonly known as: Edecrin Take 1 tablet (25 mg total) by mouth daily.   gabapentin 600 MG tablet Commonly known as: NEURONTIN Take 1 tablet (600 mg total) by mouth 3 (three) times daily.   hydrALAZINE 25 MG tablet Commonly known as: APRESOLINE Take 1 tablet (25 mg total) by mouth 3 (three) times daily.   ketoconazole 2 % cream Commonly known as: NIZORAL Apply 1 application topically 2 (two) times daily.   metFORMIN 850 MG tablet Commonly known as: GLUCOPHAGE TAKE 1 TABLET BY MOUTH TWICE DAILY WITH A MEAL   propranolol ER 80 MG 24 hr capsule Commonly known as: INDERAL LA Take 1 capsule (80 mg total) by mouth daily.   rosuvastatin 20 MG tablet Commonly known as: CRESTOR Take 1 tablet (20 mg total) by mouth daily.   timolol 0.5 % ophthalmic solution Commonly known as: TIMOPTIC Place 1 drop into both eyes every morning. As directed           Objective:   Physical Exam Skin:        BP (!) 160/46 (BP Location: Left Arm, Patient Position: Sitting, Cuff Size: Small)   Pulse (!) 59   Temp (!) 96.5 F (35.8 C) (Temporal)   Resp 16   Ht 6\' 2"  (1.88 m)   Wt 221 lb 8 oz (100.5 kg)   SpO2 97%   BMI 28.44 kg/m  General:   Well developed, NAD, BMI noted. HEENT:  Normocephalic . Face symmetric, atraumatic Lungs:  CTA B Normal respiratory effort, no intercostal retractions, no accessory muscle use. Heart: RRR,  no murmur.  Trace  pretibial edema bilaterally  Skin: Not pale. Not jaundice Neurologic:  alert & oriented X3.  Speech normal, gait  appropriate for age and unassisted Psych--  Cognition and judgment appear intact.  Cooperative with normal attention span and concentration.  Behavior appropriate. No  anxious or depressed appearing.      Assessment    Assessment Diabetes HTN Hyperlipidemia Pain management: *Neuropathy (painful type):  ---[id number]: Normal 123456, folic acid, RPR. CK slightly elevated. NCS severe polyneuropathy ---Re-eval by neuro 06-2016: exam c/w neuropathy. Labs (-) ---intolerant  to lyrica ; intolerant to Elavil 04-2017 *MSK: h/o back surgery 2003 *Claudication:   Unremarkable ABIs 2017, back MRI 02-2016 (spinal stenosis not mention on the  report), saw Dr. Rita Ohara, symptoms not neurogenic.  Probably small vessel disease PVD. ABIs neg again 12-2016  CV: -CAD, CABG -Aortic insufficiency, dilated aortic root -Carotid artery disease 40-59% 10-2014, [place] 11-2015 : 1-39%, next per cards  -Ascending aortic aneurysm, last CT chest 10-2015 GU: BPH, bladder cancer Thyroid nodules: Small per [place]  03-2014 Cholelithiasis by CT 01-2014 Preglaucoma  PLAN: DM: On metformin, check A1c HTN: Ambulatory BPs are very good in the 120s, currently on benazepril, Edecrin, hydralazine, Inderal.  Edecrin is very expensive, go back on HCTZ?  Discussed with the pharmacist, he is allergic to sulfa, there is a chance to cross reaction with HCTZ.  Recommend to stay with the same medications, check a BMP and CBC. Neuropathy: Mild improvement with gabapentin, recommend to increase dose to 4 times daily. Also use capsaicin OTC. Patient wonders about commercially available treatments, I really cannot recommend specific one, recommend to be very cautious about using them. Rash: Possibly eczema, recommend OTC hydrocortisone, call if not better RTC 5 months  Today, I spent more than  35  min with the patient: >50% of the time counseling regards neuropathy, multiple questions answered to the best of my ability. Also explaining why he  needs to stay on endocrine.

## 2018-10-06 NOTE — Assessment & Plan Note (Signed)
DM: On metformin, check A1c HTN: Ambulatory BPs are very good in the 120s, currently on benazepril, Edecrin, hydralazine, Inderal.  Edecrin is very expensive, go back on HCTZ?  Discussed with the pharmacist, he is allergic to sulfa, there is a chance to cross reaction with HCTZ.  Recommend to stay with the same medications, check a BMP and CBC. Neuropathy: Mild improvement with gabapentin, recommend to increase dose to 4 times daily. Also use capsaicin OTC. Patient wonders about commercially available treatments, I really cannot recommend specific one, recommend to be very cautious about using them. Rash: Possibly eczema, recommend OTC hydrocortisone, call if not better RTC 5 months

## 2018-10-12 ENCOUNTER — Telehealth: Payer: Self-pay | Admitting: Cardiology

## 2018-10-12 DIAGNOSIS — Z79899 Other long term (current) drug therapy: Secondary | ICD-10-CM

## 2018-10-12 MED ORDER — HYDROCHLOROTHIAZIDE 25 MG PO TABS: 25.0000 mg | ORAL_TABLET | Freq: Every day | ORAL | 3 refills | Status: DC

## 2018-10-12 NOTE — Telephone Encounter (Signed)
  Pt c/o swelling: STAT is pt has developed SOB within 24 hours  1) How much weight have you gained and in what time span? no  2) If swelling, where is the swelling located? Feet, ankles  3) Are you currently taking a fluid pill? yes  4) Are you currently SOB? no  5) Do you have a log of your daily weights (if so, list)? no  6) Have you gained 3 pounds in a day or 5 pounds in a week? no  7) Have you traveled recently? No  Wife is concerned about his feet and ankles being puffy and would like to speak to the nurse.

## 2018-10-12 NOTE — Telephone Encounter (Signed)
She will have pt stop the ethacrynic acid

## 2018-10-12 NOTE — Telephone Encounter (Signed)
DC ethacrynic acid; hctz 25 mg daily; bmet one week Kirk Ruths

## 2018-10-12 NOTE — Telephone Encounter (Signed)
Wife notified will start tomorrow. She states that she will have pt take the 12.5mg  x2 until they are gone and see how this works. She will CB if needed

## 2018-10-12 NOTE — Telephone Encounter (Signed)
Faulkner,William J she states that she took pt to see Dr Larose Kells and was give ethacrynic acid for 2 weeks. she states that pt Bilat feet and ankles are very swollen. She states that she does not think that this is working as well as "what he used to take the HCTZ". She tells me that his weight runs between 220-225 per pt (in the background). She is asking if pt should stop the ethacrynic acid and start back on the HCTZ? Or continue as directed by Dr Larose Kells? Please advise  She states that she wanted an appointment to touch base with Dr Stanford Breed appointment scheduled 11-02-2018.

## 2018-10-14 ENCOUNTER — Other Ambulatory Visit: Payer: Medicare Other

## 2018-10-20 ENCOUNTER — Other Ambulatory Visit (INDEPENDENT_AMBULATORY_CARE_PROVIDER_SITE_OTHER): Payer: Medicare Other

## 2018-10-20 ENCOUNTER — Other Ambulatory Visit: Payer: Self-pay

## 2018-10-20 DIAGNOSIS — E875 Hyperkalemia: Secondary | ICD-10-CM | POA: Diagnosis not present

## 2018-10-20 LAB — BASIC METABOLIC PANEL
BUN: 34 mg/dL — ABNORMAL HIGH (ref 6–23)
CO2: 29 mEq/L (ref 19–32)
Calcium: 10.1 mg/dL (ref 8.4–10.5)
Chloride: 102 mEq/L (ref 96–112)
Creatinine, Ser: 1.43 mg/dL (ref 0.40–1.50)
GFR: 47.3 mL/min — ABNORMAL LOW (ref >60.00)
Glucose, Bld: 180 mg/dL — ABNORMAL HIGH (ref 70–99)
Potassium: 5.1 mEq/L (ref 3.5–5.1)
Sodium: 137 mEq/L (ref 135–145)

## 2018-10-21 ENCOUNTER — Other Ambulatory Visit: Payer: Medicare Other

## 2018-10-28 NOTE — Progress Notes (Signed)
Virtual Visit via Video Note   This visit type was conducted due to national recommendations for restrictions regarding the COVID-19 Pandemic (e.g. social distancing) in an effort to limit this patient's exposure and mitigate transmission in our community.  Due to his co-morbid illnesses, this patient is at least at moderate risk for complications without adequate follow up.  This format is felt to be most appropriate for this patient at this time.  All issues noted in this document were discussed and addressed.  A limited physical exam was performed with this format.  Please refer to the patient's chart for his consent to telehealth for [facility].   Date:  11/02/2018   ID:  William Faulkner, DOB Jun 03, 1934, MRN [id number]  Patient Location:Home Provider Location: Home  PCP:  Colon Branch, MD  Cardiologist:  Dr Stanford Breed  Evaluation Performed:  Follow-Up Visit  Chief Complaint:  FU CAD  History of Present Illness:    FU CAD, aortic insufficiency, and cerebrovascular disease. A cardiac catheterization in May of 2011 revealed severe three-vessel coronary artery disease and normal LV function. He subsequently underwent coronary artery bypass and graft in May of 2011 (left internal mammary artery to left anterior descending artery, saphenous vein graft to first diagonal, saphenous vein graft obtuse marginal 1, sequential saphenous vein graft to distal right coronary and posterior descending artery). Note an intraoperative transesophageal echocardiogram showed mild to moderate aortic insufficiency and a dilated aortic root. However this was felt not to require surgical intervention at that time. ABIs August 2017 normal.Carotid Dopplers November 2017 showed 1-39% bilateral stenosis.Abd ultrasound 12/17 showed >50 in proximal to mid aorta; no aneurysm. CTA November 2019 showed 4.4 to 4.5 cm ascending thoracic aortic aneurysm. Echocardiogram May 2020 showed normal LV function, mild to moderate  aortic insufficiency and moderately dilated a sending aorta measuring 49 mm.  Since I last saw him,pt denies CP, dyspnea or syncope.  The patient does not have symptoms concerning for COVID-19 infection (fever, chills, cough, or new shortness of breath).    Past Medical History:  Diagnosis Date   AORTIC ANEUR UNSPEC SITE WITHOUT MENTION RUPTURE    AORTIC INSUFFICIENCY    BPH (benign prostatic hyperplasia)    CAD, ARTERY BYPASS GRAFT    Cancer ([place])    CEREBROVASCULAR DISEASE    DIAB W/O COMP TYPE II/UNS NOT STATED UNCNTRL    Hyperlipidemia    HYPERTENSION, ESSENTIAL NOS    NEOPLASM, MALIGNANT, BLADDER, HX OF    Rotator cuff tear    Left shoulder,    Past Surgical History:  Procedure Laterality Date   BACK SURGERY  2003   lower   BLADDER TUMOR EXCISION     Surgery x6, Dr.Tannebaum   BLEPHAROPLASTY  1980   bilaterally, Dr Gershon Crane   COLONOSCOPY W/ POLYPECTOMY     Dr Ree Shay GI   CORONARY ARTERY BYPASS GRAFT  2011   x 5   CYSTOSCOPY  09/02/2012   INGUINAL HERNIA REPAIR  10/24/2011   Procedure: HERNIA REPAIR INGUINAL ADULT;  Surgeon: Earnstine Regal, MD;  Location: [facility];  Service: General;  Laterality: Right;  Repair Right Inguinal Hernia with Mesh, Umbilical Hernia Repair with Mesh   LUMBAR EPIDURAL INJECTION  2008   ROTATOR CUFF REPAIR  2009   right; Dr Shellia Carwin   SHOULDER ARTHROSCOPY WITH ROTATOR CUFF REPAIR AND SUBACROMIAL DECOMPRESSION  02/14/2014   Right, SAD/DCR, biceps tenodesis   UMBILICAL HERNIA REPAIR  10/24/2011   Procedure: HERNIA REPAIR UMBILICAL ADULT;  Surgeon: Earnstine Regal, MD;  Location: [facility];  Service: General;  Laterality: N/A;     Current Meds  Medication Sig   aspirin 81 MG tablet Take 81 mg by mouth daily.     benazepril (LOTENSIN) 40 MG tablet Take 1 tablet (40 mg total) by mouth daily.   cholecalciferol (VITAMIN D) 1000 UNITS tablet Take 1,000 Units by mouth daily.   gabapentin (NEURONTIN) 600 MG tablet Take 1  tablet (600 mg total) by mouth 4 (four) times daily.   hydrALAZINE (APRESOLINE) 25 MG tablet Take 1 tablet (25 mg total) by mouth 3 (three) times daily.   hydrochlorothiazide (HYDRODIURIL) 25 MG tablet Take 1 tablet (25 mg total) by mouth daily.   ketoconazole (NIZORAL) 2 % cream Apply 1 application topically 2 (two) times daily.   metFORMIN (GLUCOPHAGE) 850 MG tablet TAKE 1 TABLET BY MOUTH TWICE DAILY WITH A MEAL   propranolol ER (INDERAL LA) 80 MG 24 hr capsule Take 1 capsule (80 mg total) by mouth daily.   rosuvastatin (CRESTOR) 20 MG tablet Take 1 tablet (20 mg total) by mouth daily.   timolol (TIMOPTIC) 0.5 % ophthalmic solution Place 1 drop into both eyes every morning. As directed     Allergies:   Atorvastatin, Celecoxib, Sulfonamide derivatives, and Zolpidem tartrate   Social History   Tobacco Use   Smoking status: Former Smoker    Packs/day: 0.30    Years: 15.00    Pack years: 4.50    Types: Cigarettes    Quit date: 01/22/1984    Years since quitting: 34.8   Smokeless tobacco: Never Used  Substance Use Topics   Alcohol use: No    Alcohol/week: 0.0 standard drinks    Comment: rare, wine    Drug use: No     Family Hx: The patient's family history includes Diabetes in his mother; Other in his sister; Tremor in his father. There is no history of Stroke, Heart disease, or Cancer.  ROS:   Please see the history of present illness.   Problems with back and neck pain; peripheral neuropathy No Fever, chills  or productive cough All other systems reviewed and are negative.   Recent Labs: 03/31/2018: ALT 16 10/05/2018: Hemoglobin 13.0; Platelets 215.0 10/20/2018: BUN 34; Creatinine, Ser 1.43; Potassium 5.1; Sodium 137   Recent Lipid Panel Lab Results  Component Value Date/Time   CHOL 130 03/31/2018 09:29 AM   TRIG 199 (H) 03/31/2018 09:29 AM   HDL 38 (L) 03/31/2018 09:29 AM   CHOLHDL 3.4 03/31/2018 09:29 AM   CHOLHDL 3 08/23/2015 10:22 AM   LDLCALC 52  03/31/2018 09:29 AM   LDLDIRECT 102.1 05/19/2007 01:48 PM    Wt Readings from Last 3 Encounters:  11/02/18 218 lb (98.9 kg)  10/05/18 221 lb 8 oz (100.5 kg)  09/15/18 222 lb 6 oz (100.9 kg)     Objective:    Vital Signs:  BP 124/75    Pulse (!) 59    Ht 6\' 2"  (1.88 m)    Wt 218 lb (98.9 kg)    BMI 27.99 kg/m    VITAL SIGNS:  reviewed NAD Answers questions appropriately Normal affect Remainder of physical examination not performed (telehealth visit; coronavirus pandemic)  ASSESSMENT & PLAN:    1. Coronary artery disease-patient has not had recurrent chest pain.  Continue medical therapy with aspirin and statin. 2. Thoracic aortic aneurysm-plan follow-up CTA November 2020. 3. Aortic insufficiency-mild to moderate on most recent echocardiogram. Plan FU study 5/21. 4.  Hypertension-blood pressure is controlled.  Continue present medications and follow. 5. Hyperlipidemia-continue statin. 6. Carotid artery disease-mild on most recent Dopplers.  COVID-19 Education: The importance of social distancing was discussed today.  Time:   Today, I have spent 18 minutes with the patient with telehealth technology discussing the above problems.     Medication Adjustments/Labs and Tests Ordered: Current medicines are reviewed at length with the patient today.  Concerns regarding medicines are outlined above.   Tests Ordered: No orders of the defined types were placed in this encounter.   Medication Changes: No orders of the defined types were placed in this encounter.   Follow Up:  Either In Person or Virtual Visit in 6 month(s)  Signed, Kirk Ruths, MD  11/02/2018 8:42 AM    [place]

## 2018-11-02 ENCOUNTER — Encounter: Payer: Self-pay | Admitting: Cardiology

## 2018-11-02 ENCOUNTER — Other Ambulatory Visit: Payer: Self-pay

## 2018-11-02 ENCOUNTER — Telehealth (INDEPENDENT_AMBULATORY_CARE_PROVIDER_SITE_OTHER): Payer: Medicare Other | Admitting: Cardiology

## 2018-11-02 VITALS — BP 124/75 | HR 59 | Ht 74.0 in | Wt 218.0 lb

## 2018-11-02 DIAGNOSIS — I712 Thoracic aortic aneurysm, without rupture, unspecified: Secondary | ICD-10-CM

## 2018-11-02 DIAGNOSIS — I10 Essential (primary) hypertension: Secondary | ICD-10-CM | POA: Diagnosis not present

## 2018-11-02 DIAGNOSIS — I2510 Atherosclerotic heart disease of native coronary artery without angina pectoris: Secondary | ICD-10-CM

## 2018-11-02 DIAGNOSIS — I359 Nonrheumatic aortic valve disorder, unspecified: Secondary | ICD-10-CM

## 2018-11-02 NOTE — Patient Instructions (Addendum)
Medication Instructions:  Take your Propanolol 2 hours before your CT scheduled time.  If you need a refill on your cardiac medications before your next appointment, please call your pharmacy.   Lab work: BMET one week before CT Angio If you have labs (blood work) drawn today and your tests are completely normal, you will receive your results only by: Marland Kitchen MyChart Message (if you have MyChart) OR . A paper copy in the mail If you have any lab test that is abnormal or we need to change your treatment, we will call you to review the results.  Testing/Procedures: CT Angio Aorta due in November to follow up.   Follow-Up: At [facility], you and your health needs are our priority.  As part of our continuing mission to provide you with exceptional heart care, we have created designated Provider Care Teams.  These Care Teams include your primary Cardiologist (physician) and Advanced Practice Providers (APPs -  Physician Assistants and Nurse Practitioners) who all work together to provide you with the care you need, when you need it. You will need a follow up appointment in 6 months.  Please call our office 2 months in advance to schedule this appointment.  You may see Dr.Crenshaw or one of the following Advanced Practice Providers on your designated Care Team:   Kerin Ransom, PA-C Roby Lofts, [place] . Sande Rives, PA-C  Any Other Special Instructions Will Be Listed Below (If Applicable). Your cardiac CT will be scheduled at one of the below locations:   [facility], [place] [address] [place], [place] [address] (336) [place] [address] [place], [place] [address] [phone]  If scheduled at [facility], please arrive at the [facility] main entrance of [facility] 30-45 minutes prior to test start time. Proceed to the [facility] [address] Radiology Department (first floor) to check-in and test  prep.  If scheduled at [facility], please arrive 15 mins early for check-in and test prep.  Please follow these instructions carefully (unless otherwise directed):  Hold all erectile dysfunction medications at least 3 days (72 hrs) prior to test.  On the Night Before the Test: . Be sure to Drink plenty of water. . Do not consume any caffeinated/decaffeinated beverages or chocolate 12 hours prior to your test. . Do not take any antihistamines 12 hours prior to your test.  On the Day of the Test: . Drink plenty of water. Do not drink any water within one hour of the test. . Do not eat any food 4 hours prior to the test. . You may take your regular medications prior to the test.  . Take metoprolol (Lopressor) two hours prior to test. . HOLD Furosemide/Hydrochlorothiazide morning of the test. . FEMALES- please wear underwire-free bra if available.       After the Test: . Drink plenty of water. . After receiving IV contrast, you may experience a mild flushed feeling. This is normal. . On occasion, you may experience a mild rash up to 24 hours after the test. This is not dangerous. If this occurs, you can take Benadryl 25 mg and increase your fluid intake. . If you experience trouble breathing, this can be serious. If it is severe call 911 IMMEDIATELY. If it is mild, please call our office. . If you take any of these medications: Glipizide/Metformin, Avandament, Glucavance, please do not take 48 hours after completing test unless otherwise instructed.  Please contact the cardiac imaging nurse navigator should you have any questions/concerns Marchia Bond, RN Navigator Cardiac Imaging [place] [facility] [phone] Office  [phone] Cell

## 2018-11-18 ENCOUNTER — Other Ambulatory Visit: Payer: Self-pay | Admitting: Internal Medicine

## 2018-11-23 ENCOUNTER — Ambulatory Visit (HOSPITAL_COMMUNITY)
Admission: RE | Admit: 2018-11-23 | Discharge: 2018-11-23 | Disposition: A | Discharge status: Home / Self Care | Payer: Medicare Other | Source: Ambulatory Visit | Attending: Cardiology | Admitting: Cardiology

## 2018-11-23 ENCOUNTER — Other Ambulatory Visit: Payer: Self-pay

## 2018-11-23 DIAGNOSIS — I712 Thoracic aortic aneurysm, without rupture, unspecified: Secondary | ICD-10-CM

## 2018-11-23 MED ORDER — IOHEXOL 350 MG/ML SOLN
100.0000 mL | Freq: Once | INTRAVENOUS | Status: AC | PRN
  Administered 2018-11-23: 17:00:00 100 mL via INTRAVENOUS

## 2018-11-24 ENCOUNTER — Encounter: Payer: Self-pay | Admitting: *Deleted

## 2018-11-24 DIAGNOSIS — R160 Hepatomegaly, not elsewhere classified: Secondary | ICD-10-CM

## 2018-11-24 NOTE — Telephone Encounter (Signed)
-----   Message from Lelon Perla, MD sent at 11/23/2018  5:56 PM EST ----- Schedule liver ultrasound Kirk Ruths

## 2018-11-25 NOTE — Telephone Encounter (Signed)
This encounter was created in error - please disregard.

## 2018-11-26 ENCOUNTER — Other Ambulatory Visit: Payer: Self-pay

## 2018-11-30 ENCOUNTER — Encounter: Payer: Self-pay | Admitting: Internal Medicine

## 2018-11-30 ENCOUNTER — Ambulatory Visit (INDEPENDENT_AMBULATORY_CARE_PROVIDER_SITE_OTHER): Payer: Medicare Other | Admitting: Internal Medicine

## 2018-11-30 ENCOUNTER — Other Ambulatory Visit: Payer: Self-pay

## 2018-11-30 VITALS — BP 137/43 | HR 53 | Temp 96.4°F | Resp 16 | Ht 74.0 in | Wt 221.0 lb

## 2018-11-30 DIAGNOSIS — I25810 Atherosclerosis of coronary artery bypass graft(s) without angina pectoris: Secondary | ICD-10-CM

## 2018-11-30 DIAGNOSIS — M545 Low back pain, unspecified: Secondary | ICD-10-CM

## 2018-11-30 DIAGNOSIS — R21 Rash and other nonspecific skin eruption: Secondary | ICD-10-CM | POA: Diagnosis not present

## 2018-11-30 MED ORDER — KETOCONAZOLE 2 % EX CREA: 1.0000 "application " | TOPICAL_CREAM | Freq: Two times a day (BID) | CUTANEOUS | 5 refills | Status: DC

## 2018-11-30 NOTE — Progress Notes (Signed)
Subjective:    Patient ID: William Faulkner, male    DOB: 08-10-1934, 84 y.o.   MRN: [id number]  DOS:  11/30/2018 Type of visit - description: Acute 3 weeks ago, developed pain located at the distal right back.  No radiation.  Pain increased with certain movements. No injury, fever, chills. He has neuropathy, no new or different paresthesias.  Also, 9 months ago developed a rash around the right nipple.  The rash has spread in a circular manner. Slightly itchy.  Started as blisters?  Is not sure.   Review of Systems See above   Past Medical History:  Diagnosis Date  . AORTIC ANEUR UNSPEC SITE WITHOUT MENTION RUPTURE   . AORTIC INSUFFICIENCY   . BPH (benign prostatic hyperplasia)   . CAD, ARTERY BYPASS GRAFT   . Cancer ([place])   . CEREBROVASCULAR DISEASE   . DIAB W/O COMP TYPE II/UNS NOT STATED UNCNTRL   . Hyperlipidemia   . HYPERTENSION, ESSENTIAL NOS   . NEOPLASM, MALIGNANT, BLADDER, HX OF   . Rotator cuff tear    Left shoulder,     Past Surgical History:  Procedure Laterality Date  . BACK SURGERY  2003   lower  . BLADDER TUMOR EXCISION     Surgery x6, Dr.Tannebaum  . BLEPHAROPLASTY  1980   bilaterally, Dr Gershon Crane  . COLONOSCOPY W/ POLYPECTOMY     Dr Ree Shay [facility]  . CORONARY ARTERY BYPASS GRAFT  2011   x 5  . CYSTOSCOPY  09/02/2012  . INGUINAL HERNIA REPAIR  10/24/2011   Procedure: HERNIA REPAIR INGUINAL ADULT;  Surgeon: Earnstine Regal, MD;  Location: [facility];  Service: General;  Laterality: Right;  Repair Right Inguinal Hernia with Mesh, Umbilical Hernia Repair with Mesh  . LUMBAR EPIDURAL INJECTION  2008  . ROTATOR CUFF REPAIR  2009   right; Dr Shellia Carwin  . SHOULDER ARTHROSCOPY WITH ROTATOR CUFF REPAIR AND SUBACROMIAL DECOMPRESSION  02/14/2014   Right, SAD/DCR, biceps tenodesis  . UMBILICAL HERNIA REPAIR  10/24/2011   Procedure: HERNIA REPAIR UMBILICAL ADULT;  Surgeon: Earnstine Regal, MD;  Location: [facility];  Service: General;  Laterality: N/A;    Social  History   Socioeconomic History  . Marital status: Married    Spouse name: Not on file  . Number of children: 3  . Years of education: Not on file  . Highest education level: Not on file  Occupational History  . Occupation: retired  Scientific laboratory technician  . Financial resource strain: Not on file  . Food insecurity    Worry: Not on file    Inability: Not on file  . Transportation needs    Medical: Not on file    Non-medical: Not on file  Tobacco Use  . Smoking status: Former Smoker    Packs/day: 0.30    Years: 15.00    Pack years: 4.50    Types: Cigarettes    Quit date: 01/22/1984    Years since quitting: 34.8  . Smokeless tobacco: Never Used  Substance and Sexual Activity  . Alcohol use: No    Alcohol/week: 0.0 standard drinks    Comment: rare, wine   . Drug use: No  . Sexual activity: Not on file  Lifestyle  . Physical activity    Days per week: Not on file    Minutes per session: Not on file  . Stress: Not on file  Relationships  . Social connections    Talks on phone: Not on file  Gets together: Not on file    Attends religious service: Not on file    Active member of club or organization: Not on file    Attends meetings of clubs or organizations: Not on file    Relationship status: Not on file  . Intimate partner violence    Fear of current or ex partner: Not on file    Emotionally abused: Not on file    Physically abused: Not on file    Forced sexual activity: Not on file  Other Topics Concern  . Not on file  Social History Narrative   8 G-children   Married x 56 years   Lives with wife in a one story home.  Has 3 children.     Retired from [employer].  Education: college.       Allergies as of 11/30/2018      Reactions   Atorvastatin Other (See Comments)   REACTION: increased cpk   Celecoxib Other (See Comments)   REACTION: palpitations= celebrex   Sulfonamide Derivatives Other (See Comments)   Unknown   Zolpidem Tartrate Other (See Comments)   REACTION:  made patient feel weird= ambien      Medication List       Accurate as of November 30, 2018 11:59 PM. If you have any questions, ask your nurse or doctor.        aspirin 81 MG tablet Take 81 mg by mouth daily.   benazepril 40 MG tablet Commonly known as: LOTENSIN Take 1 tablet (40 mg total) by mouth daily.   cholecalciferol 1000 units tablet Commonly known as: VITAMIN D Take 1,000 Units by mouth daily.   gabapentin 600 MG tablet Commonly known as: Neurontin Take 1 tablet (600 mg total) by mouth 4 (four) times daily.   hydrALAZINE 25 MG tablet Commonly known as: APRESOLINE Take 1 tablet (25 mg total) by mouth 3 (three) times daily.   hydrochlorothiazide 25 MG tablet Commonly known as: HYDRODIURIL Take 1 tablet (25 mg total) by mouth daily.   ketoconazole 2 % cream Commonly known as: NIZORAL Apply 1 application topically 2 (two) times daily.   metFORMIN 850 MG tablet Commonly known as: GLUCOPHAGE Take 1 tablet (850 mg total) by mouth 2 (two) times daily with a meal.   propranolol ER 80 MG 24 hr capsule Commonly known as: INDERAL LA Take 1 capsule (80 mg total) by mouth daily.   rosuvastatin 20 MG tablet Commonly known as: CRESTOR Take 1 tablet (20 mg total) by mouth daily.   timolol 0.5 % ophthalmic solution Commonly known as: TIMOPTIC Place 1 drop into both eyes every morning. As directed           Objective:   Physical Exam BP (!) 137/43 (BP Location: Left Arm, Patient Position: Sitting, Cuff Size: Normal)   Pulse (!) 53   Temp (!) 96.4 F (35.8 C) (Temporal)   Resp 16   Ht 6\' 2"  (1.88 m)   Wt 221 lb (100.2 kg)   SpO2 97%   BMI 28.37 kg/m  General:   Well developed, NAD, BMI noted. HEENT:  Normocephalic . Face symmetric, atraumatic Lungs:  CTA B Normal respiratory effort, no intercostal retractions, no accessory muscle use. Heart: RRR,  no murmur.  No pretibial edema bilaterally  Skin: Rash near the right nipple, see picture. Nipple  itself normal. MSK: No TTP at the lumbosacral spine Neurologic:  alert & oriented X3.  Speech normal, gait appropriate for age and unassisted.  Straight leg test  negative. Psych--  Cognition and judgment appear intact.  Cooperative with normal attention span and concentration.  Behavior appropriate. No anxious or depressed appearing.        Assessment     Assessment Diabetes HTN Hyperlipidemia Pain management: *Neuropathy (painful type):  ---[id number]: Normal 123456, folic acid, RPR. CK slightly elevated. NCS severe polyneuropathy ---Re-eval by neuro 06-2016: exam c/w neuropathy. Labs (-) ---intolerant  to lyrica ; intolerant to Elavil 04-2017 *MSK: h/o back surgery 2003 *Claudication:   Unremarkable ABIs 2017, back MRI 02-2016 (spinal stenosis not mention on the  report), saw Dr. Rita Ohara, symptoms not neurogenic.  Probably small vessel disease PVD. ABIs neg again 12-2016  CV: -CAD, CABG -Aortic insufficiency, dilated aortic root -Carotid artery disease 40-59% 10-2014, [place] 11-2015 : 1-39%, next per cards  -Ascending aortic aneurysm GU: BPH, bladder cancer Thyroid nodules: Small per [place]  03-2014 Cholelithiasis by CT 01-2014 Preglaucoma  PLAN: Right-sided low back pain: Resolved, likely was MSK related, recommend Tylenol as needed, call if symptoms resurface Rash: Ongoing since last visit, fungal?  Recommend Nizoral, continue with OTC hydrocortisone, call if not better.  Derm referral?. CAD, aortic disease: Last visit with cardiology 11/02/2018.  Felt to be stable. Had a CT done 11-2018 to assess the ascending aortic aneurysm: Stable on CT. They also found a liver abnormality, they have recommended a liver ultrasound to follow-up. RTC type II-2021

## 2018-11-30 NOTE — Progress Notes (Signed)
Pre visit review using our clinic review tool, if applicable. No additional management support is needed unless otherwise documented below in the visit note. 

## 2018-11-30 NOTE — Patient Instructions (Addendum)
Please schedule Medicare Wellness with Glenard Haring.   =====  Call if the back pain returns   Tylenol  500 mg OTC 2 tabs a day every 8 hours as needed for pain  =====  For the rash: Apply Nizoral twice a day for 2 weeks In between applications, use hydrocortisone 1% OTC Call if the rash is not gone in 3 to 4 weeks

## 2018-12-02 NOTE — Assessment & Plan Note (Signed)
Right-sided low back pain: Resolved, likely was MSK related, recommend Tylenol as needed, call if symptoms resurface Rash: Ongoing since last visit, fungal?  Recommend Nizoral, continue with OTC hydrocortisone, call if not better.  Derm referral?. CAD, aortic disease: Last visit with cardiology 11/02/2018.  Felt to be stable. Had a CT done 11-2018 to assess the ascending aortic aneurysm: Stable on CT. They also found a liver abnormality, they have recommended a liver ultrasound to follow-up. RTC type II-2021

## 2018-12-03 ENCOUNTER — Telehealth: Payer: Self-pay | Admitting: *Deleted

## 2018-12-03 NOTE — Telephone Encounter (Signed)
Left message for patient to call and schedule US abdomen limited RUQ at [facility]

## 2018-12-07 NOTE — Telephone Encounter (Signed)
Patient's wife returning call to schedule US abdomen limited RUQ at [place].

## 2018-12-08 ENCOUNTER — Ambulatory Visit
Admission: RE | Admit: 2018-12-08 | Discharge: 2018-12-08 | Disposition: A | Discharge status: Home / Self Care | Payer: Medicare Other | Source: Ambulatory Visit | Attending: Cardiology | Admitting: Cardiology

## 2018-12-08 DIAGNOSIS — R160 Hepatomegaly, not elsewhere classified: Secondary | ICD-10-CM

## 2018-12-08 IMAGING — DX DG CHEST 2V
2 series · 2 of 2 positions shown · non-contrast
Comparison: CTA of the chest performed 11/06/2015, and chest
radiograph performed 05/14/2011

CLINICAL DATA: Chronic generalized chest heaviness for 4 weeks.
Initial encounter.

EXAM:
CHEST  2 VIEW

[chest pa]
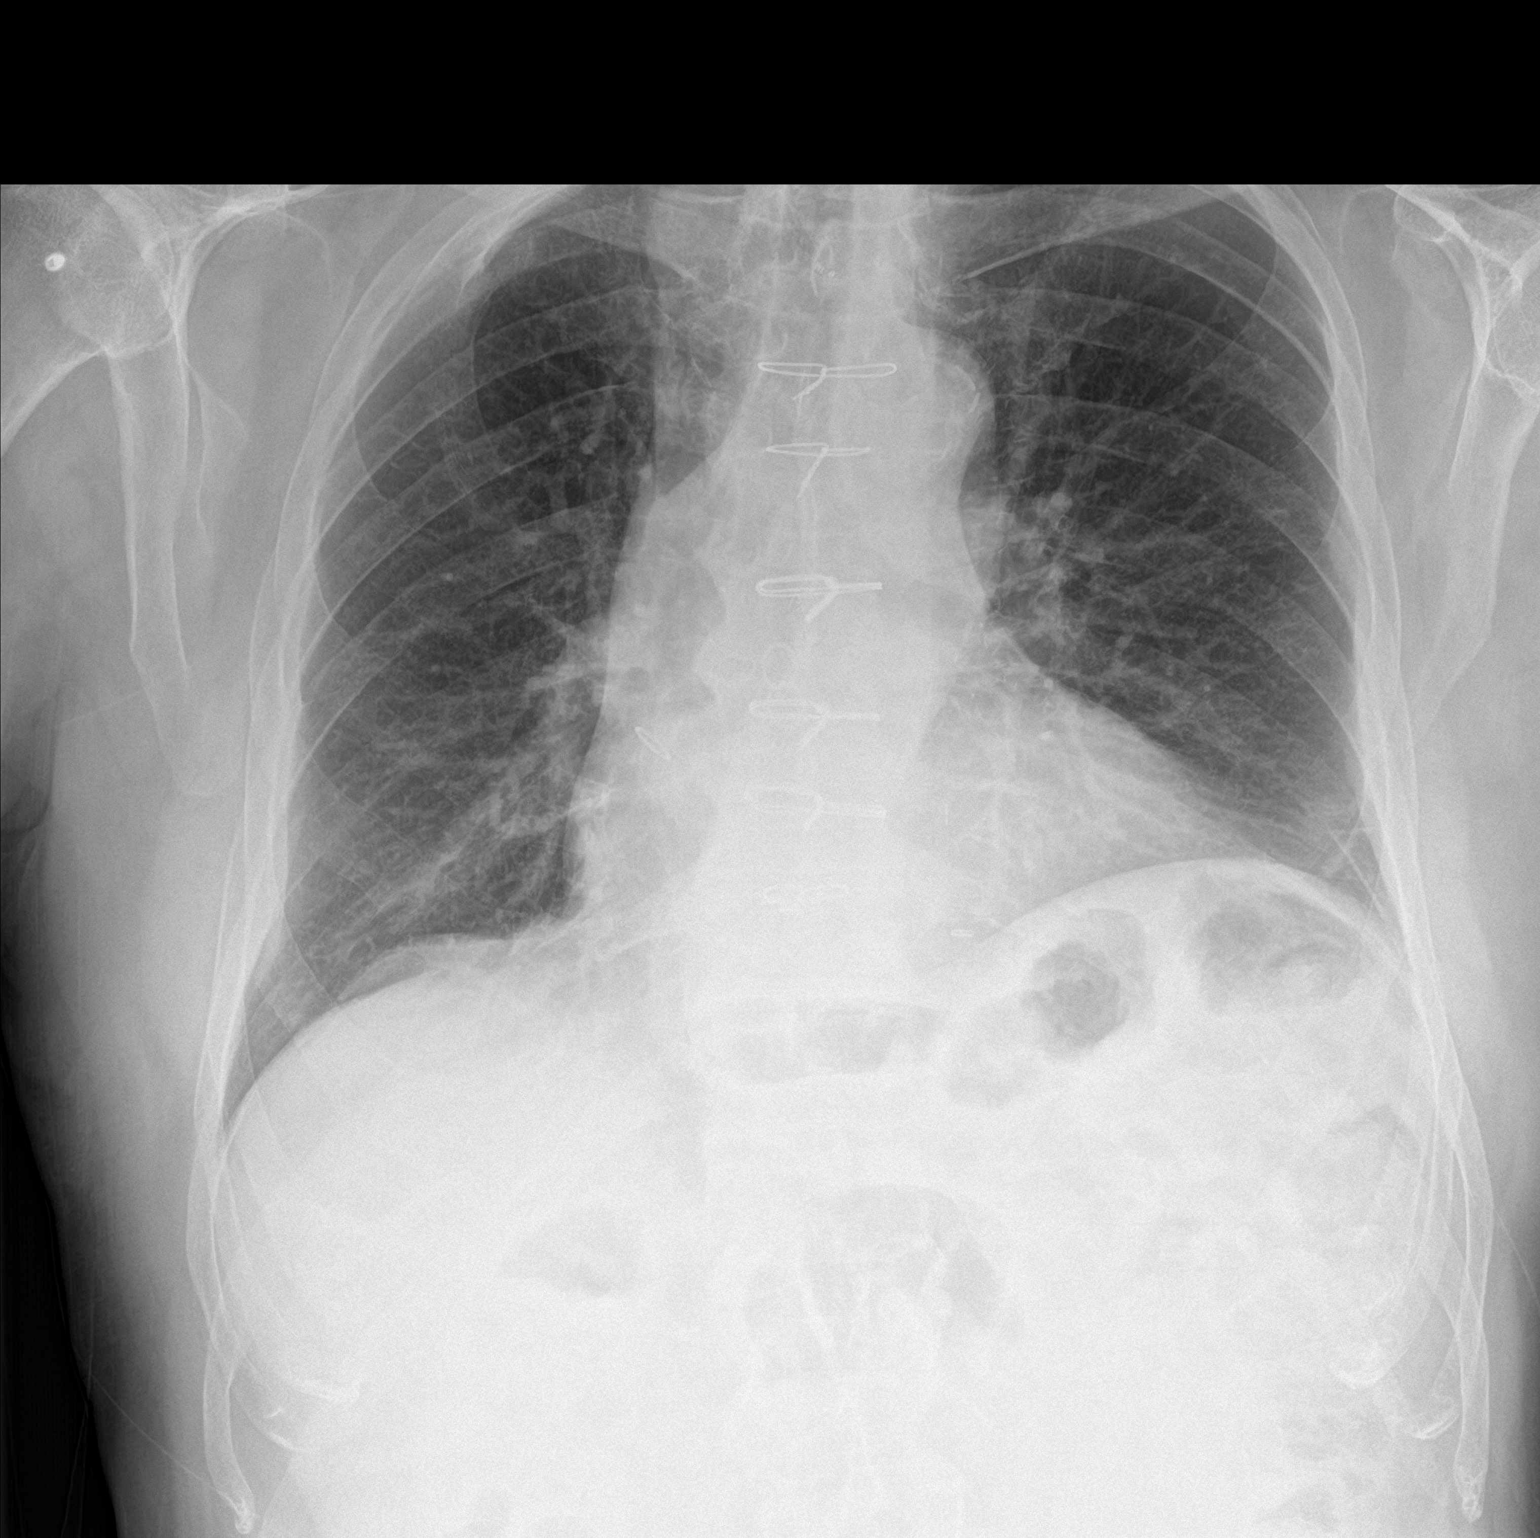

[chest lat]
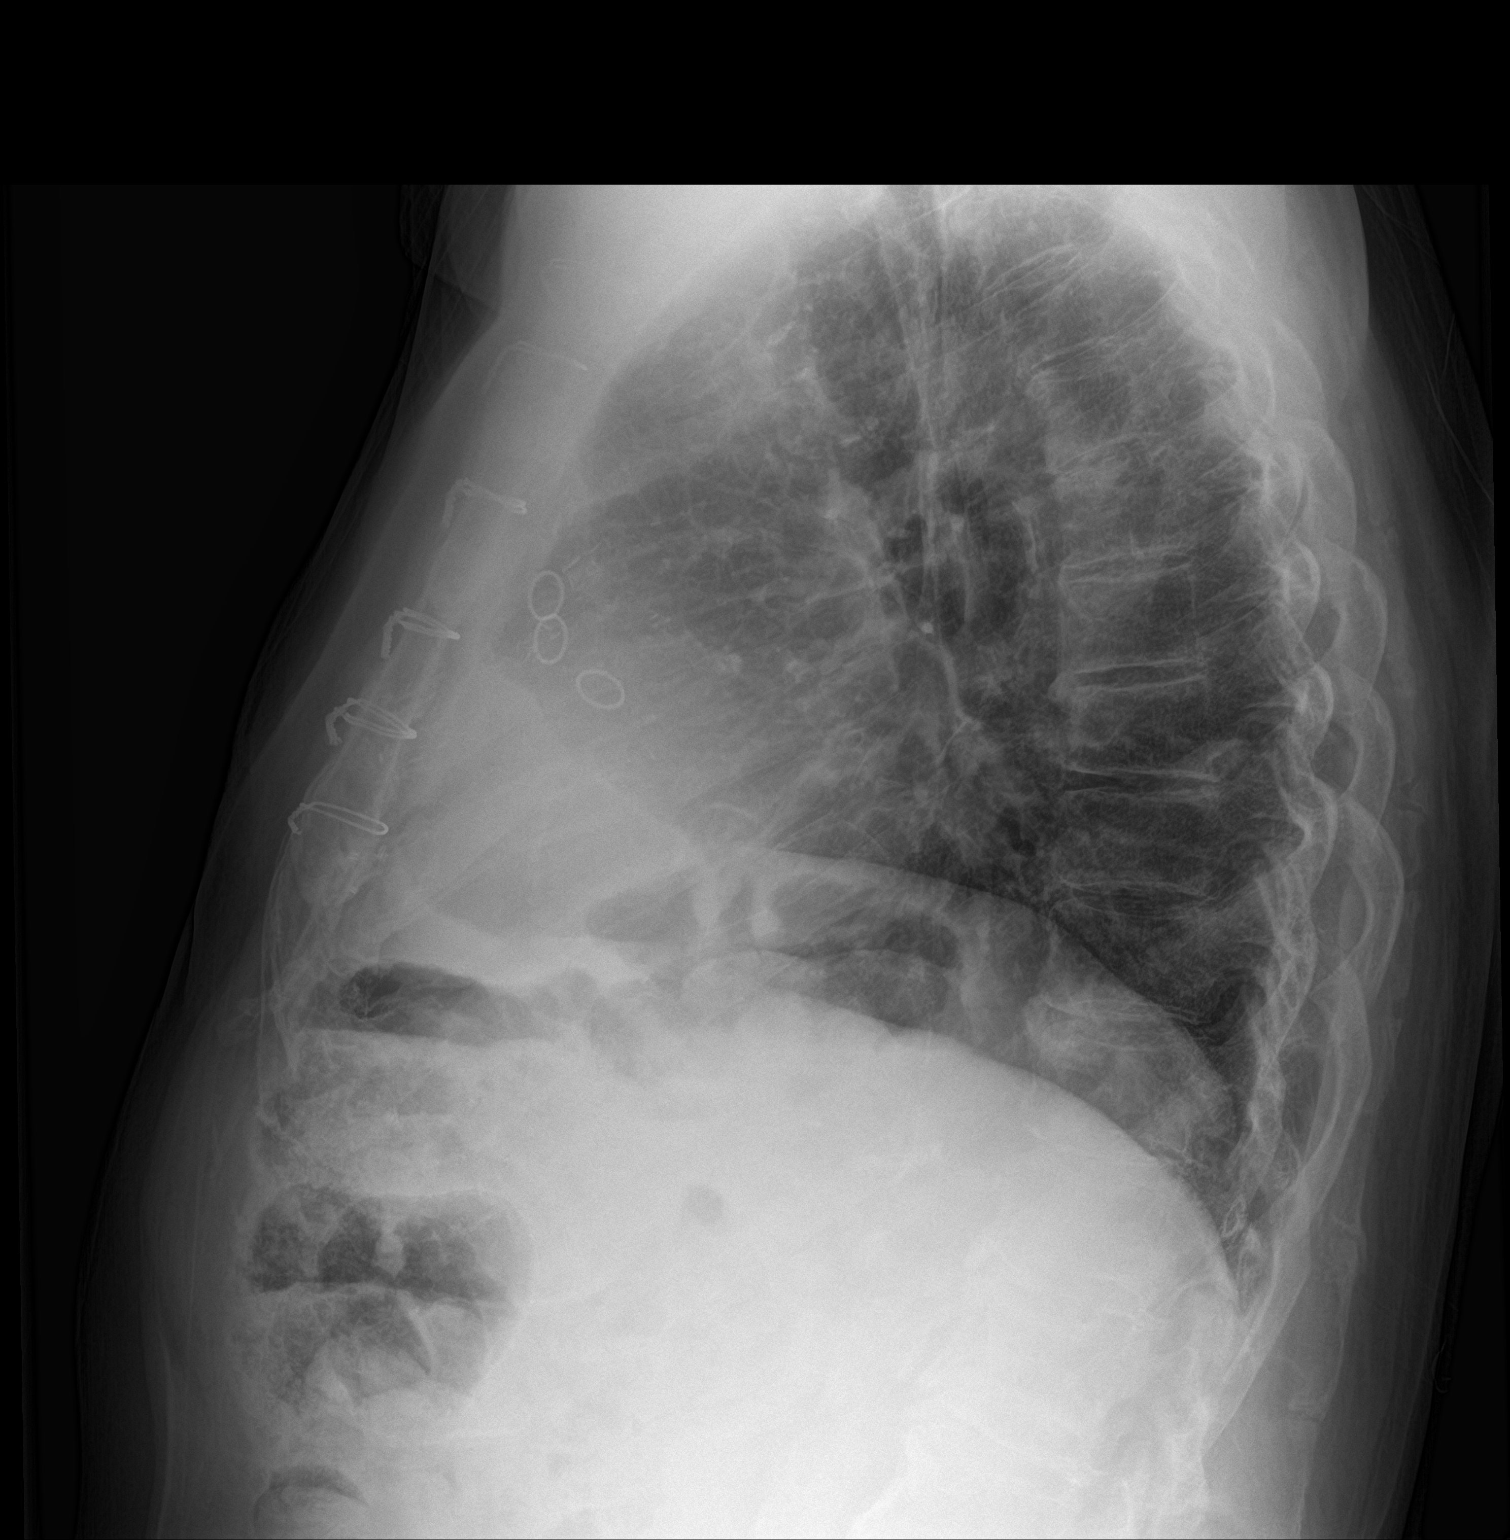

[2 of 2 positions shown; findings below may reference images not displayed]

FINDINGS: The lungs are well-aerated. Minimal left basilar atelectasis is
noted. There is no evidence of pleural effusion or pneumothorax.

The heart is borderline normal in size. The patient is status post
median sternotomy, with evidence of prior CABG. No acute osseous
abnormalities are seen.
IMPRESSION: Minimal left basilar atelectasis noted. Lungs otherwise grossly
clear.

## 2019-02-05 ENCOUNTER — Other Ambulatory Visit: Payer: Self-pay | Admitting: Internal Medicine

## 2019-02-05 MED ORDER — PROPRANOLOL HCL ER 80 MG PO CP24: 80.0000 mg | ORAL_CAPSULE | Freq: Every day | ORAL | 0 refills | Status: DC

## 2019-02-05 NOTE — Telephone Encounter (Signed)
Medication Refill - Medication: propranolol ER (INDERAL LA) 80 MG 24 hr capsule   Preferred Pharmacy (with phone number or street name):  [place], [place] [employer] Phone:  [phone]  Fax:  [phone]       Agent: Please be advised that RX refills may take up to 3 business days. We ask that you follow-up with your pharmacy.

## 2019-02-28 ENCOUNTER — Other Ambulatory Visit: Payer: Self-pay | Admitting: Internal Medicine

## 2019-03-10 ENCOUNTER — Other Ambulatory Visit: Payer: Self-pay

## 2019-03-10 ENCOUNTER — Other Ambulatory Visit: Payer: Self-pay | Admitting: Internal Medicine

## 2019-03-10 DIAGNOSIS — I10 Essential (primary) hypertension: Secondary | ICD-10-CM

## 2019-03-10 MED ORDER — ROSUVASTATIN CALCIUM 20 MG PO TABS: 20.0000 mg | ORAL_TABLET | Freq: Every day | ORAL | 3 refills | Status: DC

## 2019-03-10 MED ORDER — HYDRALAZINE HCL 25 MG PO TABS: 25.0000 mg | ORAL_TABLET | Freq: Three times a day (TID) | ORAL | 3 refills | Status: DC

## 2019-03-10 MED ORDER — HYDROCHLOROTHIAZIDE 25 MG PO TABS: 25.0000 mg | ORAL_TABLET | Freq: Every day | ORAL | 3 refills | Status: DC

## 2019-03-10 NOTE — Telephone Encounter (Signed)
Please advise when Pt is to return to clinic.

## 2019-03-10 NOTE — Telephone Encounter (Signed)
Next visit is due this month, please arrange. Okay 90 days with no refills.

## 2019-03-14 ENCOUNTER — Ambulatory Visit: Payer: Medicare Other | Attending: Internal Medicine

## 2019-03-14 DIAGNOSIS — Z23 Encounter for immunization: Secondary | ICD-10-CM | POA: Insufficient documentation

## 2019-03-14 NOTE — Progress Notes (Signed)
   Covid-19 Vaccination Clinic  Name:  William Faulkner    MRN: [id number] DOB: [birth year]  03/14/2019  [name] was observed post Covid-19 immunization for 15 minutes without incidence. He was provided with Vaccine Information Sheet and instruction to access the V-Safe system.   [name] was instructed to call 911 with any severe reactions post vaccine: Marland Kitchen Difficulty breathing  . Swelling of your face and throat  . A fast heartbeat  . A bad rash all over your body  . Dizziness and weakness    Immunizations Administered    Name Date Dose VIS Date Route   Pfizer COVID-19 Vaccine 03/14/2019 10:38 AM 0.3 mL 01/01/2019 Intramuscular   Manufacturer: [place]   Lot: [id number]   [place]: [id number]

## 2019-03-28 ENCOUNTER — Other Ambulatory Visit: Payer: Self-pay | Admitting: Cardiology

## 2019-04-07 ENCOUNTER — Ambulatory Visit: Payer: Medicare Other | Attending: Internal Medicine

## 2019-04-07 DIAGNOSIS — Z23 Encounter for immunization: Secondary | ICD-10-CM

## 2019-04-07 NOTE — Progress Notes (Signed)
   Covid-19 Vaccination Clinic  Name:  William Faulkner    MRN: [id number] DOB: 11-24-1934  04/07/2019  [name] was observed post Covid-19 immunization for 15 minutes without incident. He was provided with Vaccine Information Sheet and instruction to access the V-Safe system.   [name] was instructed to call 911 with any severe reactions post vaccine: Marland Kitchen Difficulty breathing  . Swelling of face and throat  . A fast heartbeat  . A bad rash all over body  . Dizziness and weakness   Immunizations Administered    Name Date Dose VIS Date Route   Pfizer COVID-19 Vaccine 04/07/2019 10:17 AM 0.3 mL 01/01/2019 Intramuscular   Manufacturer: [place]   Lot: [id number]   [place]: [id number]

## 2019-05-12 NOTE — Progress Notes (Signed)
HPI: FU CAD, aortic insufficiency, and cerebrovascular disease. A cardiac catheterization in May of 2011 revealed severe three-vessel coronary artery disease and normal LV function. He subsequently underwent coronary artery bypass and graft in May of 2011 (left internal mammary artery to left anterior descending artery, saphenous vein graft to first diagonal, saphenous vein graft obtuse marginal 1, sequential saphenous vein graft to distal right coronary and posterior descending artery). Note an intraoperative transesophageal echocardiogram showed mild to moderate aortic insufficiency and a dilated aortic root. However this was felt not to require surgical intervention at that time. ABIs August 2017 normal.Carotid Dopplers November 2017 showed 1-39% bilateral stenosis.Abd ultrasound 12/17 showed >50 in proximal to mid aorta; no aneurysm. Echocardiogram May 2020 showed normal LV function, mild to moderate aortic insufficiency and moderately dilated ascending aorta measuring 49 mm. CTA November 2020 showed 4.4 cm thoracic aneurysm, mass in the dome of the liver.  Follow-up ultrasound showed complicated cyst right lobe of liver.  Since I last saw him,he has some dyspnea on exertion but no orthopnea, PND, chest pain or syncope.  Occasional mild pedal edema.  He has had difficulties with balance.  Current Outpatient Medications  Medication Sig Dispense Refill  . aspirin 81 MG tablet Take 81 mg by mouth daily.      . benazepril (LOTENSIN) 40 MG tablet Take 1 tablet (40 mg total) by mouth daily. 90 tablet 1  . cholecalciferol (VITAMIN D) 1000 UNITS tablet Take 1,000 Units by mouth daily.    Marland Kitchen gabapentin (NEURONTIN) 600 MG tablet Take 1 tablet (600 mg total) by mouth 3 (three) times daily. 270 tablet 0  . hydrALAZINE (APRESOLINE) 25 MG tablet Take 1 tablet (25 mg total) by mouth 3 (three) times daily. 270 tablet 3  . hydrochlorothiazide (HYDRODIURIL) 25 MG tablet TAKE 1 TABLET BY MOUTH EVERY DAY 90  tablet 2  . ketoconazole (NIZORAL) 2 % cream Apply 1 application topically 2 (two) times daily. 60 g 5  . metFORMIN (GLUCOPHAGE) 850 MG tablet Take 1 tablet (850 mg total) by mouth 2 (two) times daily with a meal. 180 tablet 1  . propranolol ER (INDERAL LA) 80 MG 24 hr capsule Take 1 capsule (80 mg total) by mouth daily. 90 capsule 0  . rosuvastatin (CRESTOR) 20 MG tablet Take 1 tablet (20 mg total) by mouth daily. 90 tablet 3  . timolol (TIMOPTIC) 0.5 % ophthalmic solution Place 1 drop into both eyes every morning. As directed     No current facility-administered medications for this visit.     Past Medical History:  Diagnosis Date  . AORTIC ANEUR UNSPEC SITE WITHOUT MENTION RUPTURE   . AORTIC INSUFFICIENCY   . BPH (benign prostatic hyperplasia)   . CAD, ARTERY BYPASS GRAFT   . Cancer ([place])   . CEREBROVASCULAR DISEASE   . DIAB W/O COMP TYPE II/UNS NOT STATED UNCNTRL   . Hyperlipidemia   . HYPERTENSION, ESSENTIAL NOS   . NEOPLASM, MALIGNANT, BLADDER, HX OF   . Rotator cuff tear    Left shoulder,     Past Surgical History:  Procedure Laterality Date  . BACK SURGERY  2003   lower  . BLADDER TUMOR EXCISION     Surgery x6, Dr.Tannebaum  . BLEPHAROPLASTY  1980   bilaterally, Dr Gershon Crane  . COLONOSCOPY W/ POLYPECTOMY     Dr Ree Shay GI  . CORONARY ARTERY BYPASS GRAFT  2011   x 5  . CYSTOSCOPY  09/02/2012  . INGUINAL HERNIA REPAIR  10/24/2011   Procedure: HERNIA REPAIR INGUINAL ADULT;  Surgeon: Earnstine Regal, MD;  Location: [facility];  Service: General;  Laterality: Right;  Repair Right Inguinal Hernia with Mesh, Umbilical Hernia Repair with Mesh  . LUMBAR EPIDURAL INJECTION  2008  . ROTATOR CUFF REPAIR  2009   right; Dr Shellia Carwin  . SHOULDER ARTHROSCOPY WITH ROTATOR CUFF REPAIR AND SUBACROMIAL DECOMPRESSION  02/14/2014   Right, SAD/DCR, biceps tenodesis  . UMBILICAL HERNIA REPAIR  10/24/2011   Procedure: HERNIA REPAIR UMBILICAL ADULT;  Surgeon: Earnstine Regal, MD;  Location: [facility];  Service: General;  Laterality: N/A;    Social History   Socioeconomic History  . Marital status: Married    Spouse name: Not on file  . Number of children: 3  . Years of education: Not on file  . Highest education level: Not on file  Occupational History  . Occupation: retired  Tobacco Use  . Smoking status: Former Smoker    Packs/day: 0.30    Years: 15.00    Pack years: 4.50    Types: Cigarettes    Quit date: 01/22/1984    Years since quitting: 35.3  . Smokeless tobacco: Never Used  Substance and Sexual Activity  . Alcohol use: No    Alcohol/week: 0.0 standard drinks    Comment: rare, wine   . Drug use: No  . Sexual activity: Not on file  Other Topics Concern  . Not on file  Social History Narrative   8 G-children   Married x 56 years   Lives with wife in a one story home.  Has 3 children.     Retired from [employer].  Education: college.    Social Determinants of Health   Financial Resource Strain:   . Difficulty of Paying Living Expenses:   Food Insecurity:   . Worried About Charity fundraiser in the Last Year:   . Arboriculturist in the Last Year:   Transportation Needs:   . Film/video editor (Medical):   Marland Kitchen Lack of Transportation (Non-Medical):   Physical Activity:   . Days of Exercise per Week:   . Minutes of Exercise per Session:   Stress:   . Feeling of Stress :   Social Connections:   . Frequency of Communication with Friends and Family:   . Frequency of Social Gatherings with Friends and Family:   . Attends Religious Services:   . Active Member of Clubs or Organizations:   . Attends Archivist Meetings:   Marland Kitchen Marital Status:   Intimate Partner Violence:   . Fear of Current or Ex-Partner:   . Emotionally Abused:   Marland Kitchen Physically Abused:   . Sexually Abused:     Family History  Problem Relation Age of Onset  . Diabetes Mother   . Tremor Father   . Other Sister   . Stroke Neg Hx   . Heart disease Neg Hx   . Cancer Neg Hx      ROS: Arthralgias and back pain but no fevers or chills, productive cough, hemoptysis, dysphasia, odynophagia, melena, hematochezia, dysuria, hematuria, rash, seizure activity, orthopnea, PND, claudication. Remaining systems are negative.  Physical Exam: Well-developed well-nourished in no acute distress.  Skin is warm and dry.  HEENT is normal.  Neck is supple.  Chest is clear to auscultation with normal expansion.  Cardiovascular exam is regular rate and rhythm.  2/6 systolic murmur and diastolic murmur. Abdominal exam nontender or distended. No masses palpated. Extremities  show trace edema. neuro grossly intact  ECG-sinus bradycardia at a rate of 57, first-degree AV block, right bundle branch block, inferior infarct personally reviewed  A/P  1 coronary artery disease-no chest pain.  Plan to continue medical therapy with aspirin and statin.  2 thoracic aortic aneurysm-patient will need follow-up CTA November 2021.  3 history of aortic insufficiency-plan repeat echocardiogram May 2021.  4 hypertension-blood pressure controlled.  Continue present medical regimen.  5 hyperlipidemia-continue statin.  6 carotid artery disease-this appears to be mild on most recent carotid Dopplers.  Kirk Ruths, MD

## 2019-05-18 ENCOUNTER — Encounter: Payer: Self-pay | Admitting: Cardiology

## 2019-05-18 ENCOUNTER — Other Ambulatory Visit: Payer: Self-pay

## 2019-05-18 ENCOUNTER — Ambulatory Visit (INDEPENDENT_AMBULATORY_CARE_PROVIDER_SITE_OTHER): Payer: Medicare Other | Admitting: Cardiology

## 2019-05-18 VITALS — BP 138/80 | HR 57 | Temp 98.0°F | Ht 74.0 in | Wt 225.0 lb

## 2019-05-18 DIAGNOSIS — I712 Thoracic aortic aneurysm, without rupture, unspecified: Secondary | ICD-10-CM

## 2019-05-18 DIAGNOSIS — I10 Essential (primary) hypertension: Secondary | ICD-10-CM

## 2019-05-18 DIAGNOSIS — I359 Nonrheumatic aortic valve disorder, unspecified: Secondary | ICD-10-CM

## 2019-05-18 DIAGNOSIS — I2510 Atherosclerotic heart disease of native coronary artery without angina pectoris: Secondary | ICD-10-CM

## 2019-05-18 NOTE — Patient Instructions (Signed)
Medication Instructions:  NO CHANGES IN MEDICATION.  *If you need a refill on your cardiac medications before your next appointment, please call your pharmacy*   Lab Work: NONE.  If you have labs (blood work) drawn today and your tests are completely normal, you will receive your results only by: Marland Kitchen MyChart Message (if you have MyChart) OR . A paper copy in the mail If you have any lab test that is abnormal or we need to change your treatment, we will call you to review the results.   Testing/Procedures: Your physician has requested that you have an echocardiogram. Echocardiography is a painless test that uses sound waves to create images of your heart. It provides your doctor with information about the size and shape of your heart and how well your heart's chambers and valves are working. This procedure takes approximately one hour. There are no restrictions for this procedure. [place]  PLEASE SCHEDULE IN MAY.     Follow-Up: At [facility], you and your health needs are our priority.  As part of our continuing mission to provide you with exceptional heart care, we have created designated Provider Care Teams.  These Care Teams include your primary Cardiologist (physician) and Advanced Practice Providers (APPs -  Physician Assistants and Nurse Practitioners) who all work together to provide you with the care you need, when you need it.  We recommend signing up for the patient portal called "MyChart".  Sign up information is provided on this After Visit Summary.  MyChart is used to connect with patients for Virtual Visits (Telemedicine).  Patients are able to view lab/test results, encounter notes, upcoming appointments, etc.  Non-urgent messages can be sent to your provider as well.   To learn more about what you can do with MyChart, go to [email or web address].    Your next appointment:   6 month(s)  The format for your next appointment:   In Person  Provider:    Kirk Ruths, MD

## 2019-06-09 ENCOUNTER — Other Ambulatory Visit: Payer: Self-pay

## 2019-06-09 ENCOUNTER — Ambulatory Visit (HOSPITAL_COMMUNITY): Payer: Medicare Other | Attending: Cardiovascular Disease

## 2019-06-09 DIAGNOSIS — I359 Nonrheumatic aortic valve disorder, unspecified: Secondary | ICD-10-CM

## 2019-07-02 ENCOUNTER — Other Ambulatory Visit: Payer: Self-pay | Admitting: Internal Medicine

## 2019-07-08 ENCOUNTER — Other Ambulatory Visit: Payer: Self-pay | Admitting: Internal Medicine

## 2019-07-16 ENCOUNTER — Ambulatory Visit: Payer: Medicare Other | Admitting: Internal Medicine

## 2019-07-19 ENCOUNTER — Encounter: Payer: Self-pay | Admitting: Internal Medicine

## 2019-07-19 ENCOUNTER — Other Ambulatory Visit: Payer: Self-pay

## 2019-07-19 ENCOUNTER — Ambulatory Visit: Payer: Medicare Other | Admitting: Internal Medicine

## 2019-07-19 ENCOUNTER — Telehealth: Payer: Self-pay | Admitting: Internal Medicine

## 2019-07-19 VITALS — BP 126/60 | HR 52 | Temp 96.5°F | Resp 18 | Ht 74.0 in | Wt 216.1 lb

## 2019-07-19 DIAGNOSIS — E1140 Type 2 diabetes mellitus with diabetic neuropathy, unspecified: Secondary | ICD-10-CM

## 2019-07-19 DIAGNOSIS — E559 Vitamin D deficiency, unspecified: Secondary | ICD-10-CM

## 2019-07-19 DIAGNOSIS — E785 Hyperlipidemia, unspecified: Secondary | ICD-10-CM | POA: Diagnosis not present

## 2019-07-19 DIAGNOSIS — I10 Essential (primary) hypertension: Secondary | ICD-10-CM | POA: Diagnosis not present

## 2019-07-19 DIAGNOSIS — R21 Rash and other nonspecific skin eruption: Secondary | ICD-10-CM

## 2019-07-19 DIAGNOSIS — R5383 Other fatigue: Secondary | ICD-10-CM

## 2019-07-19 LAB — CBC WITH DIFFERENTIAL/PLATELET
Basophils Absolute: 0 10*3/uL (ref 0.0–0.1)
Basophils Relative: 0.7 % (ref 0.0–3.0)
Eosinophils Absolute: 0.4 10*3/uL (ref 0.0–0.7)
Eosinophils Relative: 5.4 % — ABNORMAL HIGH (ref 0.0–5.0)
HCT: 40.9 % (ref 39.0–52.0)
Hemoglobin: 13.3 g/dL (ref 13.0–17.0)
Lymphocytes Relative: 23.7 % (ref 12.0–46.0)
Lymphs Abs: 1.7 10*3/uL (ref 0.7–4.0)
MCHC: 32.5 g/dL (ref 30.0–36.0)
MCV: 88.2 fl (ref 78.0–100.0)
Monocytes Absolute: 0.7 10*3/uL (ref 0.1–1.0)
Monocytes Relative: 10.4 % (ref 3.0–12.0)
Neutro Abs: 4.2 10*3/uL (ref 1.4–7.7)
Neutrophils Relative %: 59.8 % (ref 43.0–77.0)
Platelets: 221 10*3/uL (ref 150.0–400.0)
RBC: 4.64 Mil/uL (ref 4.22–5.81)
RDW: 13.8 % (ref 11.5–15.5)
WBC: 7.1 10*3/uL (ref 4.0–10.5)

## 2019-07-19 LAB — COMPREHENSIVE METABOLIC PANEL
ALT: 15 U/L (ref 0–53)
AST: 17 U/L (ref 0–37)
Albumin: 4.4 g/dL (ref 3.5–5.2)
Alkaline Phosphatase: 48 U/L (ref 39–117)
BUN: 35 mg/dL — ABNORMAL HIGH (ref 6–23)
CO2: 27 mEq/L (ref 19–32)
Calcium: 9.9 mg/dL (ref 8.4–10.5)
Chloride: 104 mEq/L (ref 96–112)
Creatinine, Ser: 1.71 mg/dL — ABNORMAL HIGH (ref 0.40–1.50)
GFR: 38.41 mL/min — ABNORMAL LOW (ref >60.00)
Glucose, Bld: 97 mg/dL (ref 70–99)
Potassium: 5.5 mEq/L — ABNORMAL HIGH (ref 3.5–5.1)
Sodium: 139 mEq/L (ref 135–145)
Total Bilirubin: 0.4 mg/dL (ref 0.2–1.2)
Total Protein: 6.8 g/dL (ref 6.0–8.3)

## 2019-07-19 LAB — LIPID PANEL
Cholesterol: 115 mg/dL (ref 0–200)
HDL: 35.5 mg/dL — ABNORMAL LOW (ref >39.00)
LDL Cholesterol: 45 mg/dL (ref 0–99)
NonHDL: 79.64
Total CHOL/HDL Ratio: 3
Triglycerides: 175 mg/dL — ABNORMAL HIGH (ref 0.0–149.0)
VLDL: 35 mg/dL (ref 0.0–40.0)

## 2019-07-19 LAB — TSH: TSH: 2.87 u[IU]/mL (ref 0.35–4.50)

## 2019-07-19 LAB — HEMOGLOBIN A1C: Hgb A1c MFr Bld: 6.5 % (ref 4.6–6.5)

## 2019-07-19 LAB — VITAMIN D 25 HYDROXY (VIT D DEFICIENCY, FRACTURES): VITD: 50.18 ng/mL (ref 30.00–100.00)

## 2019-07-19 MED ORDER — KETOCONAZOLE 2 % EX CREA
1.0000 | TOPICAL_CREAM | Freq: Every day | CUTANEOUS | 0 refills | Status: DC
Start: 2019-07-19 — End: 2023-07-02

## 2019-07-19 NOTE — Progress Notes (Signed)
Subjective:    Patient ID: William Faulkner, male    DOB: [birth year], 85 y.o.   MRN: [id number]  DOS:  07/19/2019 Type of visit - description: Follow-up.  Last visit 11-2018 Discussed several issues:  Paperwork for parking permit completed Concerned about polypharmacy, request a referral DM: Ambulatory CBGs in the 130s in the morning Complain of a rash at the right hand, on and off for few months CAD: Cardiology note reviewed HTN: BP elevated today, at home reportedly better.  Reports good compliance with medication. He feels in general tired, nonspecific symptoms, but specifically denies chest pain, S OB, DOE. No cough or wheezing No nausea, vomiting, diarrhea or blood in the stools. Also occasionally feels imbalance but denies headaches, strokelike symptoms.   Some weight loss but it is on purpose.  No fever  BP Readings from Last 3 Encounters:  07/19/19 126/60  05/18/19 138/80  11/30/18 (!) 137/43     Review of Systems See above   Past Medical History:  Diagnosis Date  . AORTIC ANEUR UNSPEC SITE WITHOUT MENTION RUPTURE   . AORTIC INSUFFICIENCY   . BPH (benign prostatic hyperplasia)   . CAD, ARTERY BYPASS GRAFT   . Cancer ([place])   . CEREBROVASCULAR DISEASE   . DIAB W/O COMP TYPE II/UNS NOT STATED UNCNTRL   . Hyperlipidemia   . HYPERTENSION, ESSENTIAL NOS   . NEOPLASM, MALIGNANT, BLADDER, HX OF   . Rotator cuff tear    Left shoulder,     Past Surgical History:  Procedure Laterality Date  . BACK SURGERY  2003   lower  . BLADDER TUMOR EXCISION     Surgery x6, Dr.Tannebaum  . BLEPHAROPLASTY  1980   bilaterally, Dr Gershon Crane  . COLONOSCOPY W/ POLYPECTOMY     Dr Ree Shay [facility]  . CORONARY ARTERY BYPASS GRAFT  2011   x 5  . CYSTOSCOPY  09/02/2012  . INGUINAL HERNIA REPAIR  10/24/2011   Procedure: HERNIA REPAIR INGUINAL ADULT;  Surgeon: Earnstine Regal, MD;  Location: [facility];  Service: General;  Laterality: Right;  Repair Right Inguinal Hernia with Mesh, Umbilical  Hernia Repair with Mesh  . LUMBAR EPIDURAL INJECTION  2008  . ROTATOR CUFF REPAIR  2009   right; Dr Shellia Carwin  . SHOULDER ARTHROSCOPY WITH ROTATOR CUFF REPAIR AND SUBACROMIAL DECOMPRESSION  02/14/2014   Right, SAD/DCR, biceps tenodesis  . UMBILICAL HERNIA REPAIR  10/24/2011   Procedure: HERNIA REPAIR UMBILICAL ADULT;  Surgeon: Earnstine Regal, MD;  Location: [facility];  Service: General;  Laterality: N/A;    Allergies as of 07/19/2019      Reactions   Atorvastatin Other (See Comments)   REACTION: increased cpk   Celecoxib Other (See Comments)   REACTION: palpitations= celebrex   Sulfonamide Derivatives Other (See Comments)   Unknown   Zolpidem Tartrate Other (See Comments)   REACTION: made patient feel weird= ambien      Medication List       Accurate as of July 19, 2019 11:59 PM. If you have any questions, ask your nurse or doctor.        aspirin 81 MG tablet Take 81 mg by mouth daily.   benazepril 40 MG tablet Commonly known as: LOTENSIN Take 1 tablet (40 mg total) by mouth daily.   cholecalciferol 1000 units tablet Commonly known as: VITAMIN D Take 1,000 Units by mouth daily.   gabapentin 600 MG tablet Commonly known as: NEURONTIN Take 1 tablet (600 mg total) by mouth 3 (three)  times daily.   hydrALAZINE 25 MG tablet Commonly known as: APRESOLINE Take 1 tablet (25 mg total) by mouth 3 (three) times daily.   hydrochlorothiazide 25 MG tablet Commonly known as: HYDRODIURIL TAKE 1 TABLET BY MOUTH EVERY DAY   ketoconazole 2 % cream Commonly known as: NIZORAL Apply 1 application topically daily. What changed: when to take this Changed by: Kathlene November, MD   metFORMIN 850 MG tablet Commonly known as: GLUCOPHAGE Take 1 tablet (850 mg total) by mouth 2 (two) times daily with a meal.   propranolol ER 80 MG 24 hr capsule Commonly known as: INDERAL LA Take 1 capsule (80 mg total) by mouth daily.   rosuvastatin 20 MG tablet Commonly known as: CRESTOR Take 1 tablet (20 mg  total) by mouth daily.   timolol 0.5 % ophthalmic solution Commonly known as: TIMOPTIC Place 1 drop into both eyes every morning. As directed          Objective:   Physical Exam BP 126/60 (BP Location: Left Arm)   Pulse (!) 52   Temp (!) 96.5 F (35.8 C) (Temporal)   Resp 18   Ht 6\' 2"  (1.88 m)   Wt 216 lb 2 oz (98 kg)   SpO2 98%   BMI 27.75 kg/m  General:   Well developed, NAD, BMI noted. HEENT:  Normocephalic . Face symmetric, atraumatic Neck: No thyromegaly Lungs:  CTA B Normal respiratory effort, no intercostal retractions, no accessory muscle use. Heart: RRR, + murmur noted.  Lower extremities: no pretibial edema bilaterally  Skin: At the lateral aspect of the right hand has a numular 3cm/diameter.  Slightly red, slightly scaly Neurologic:  alert & oriented X3.  Speech normal, gait appropriate for age and unassisted Psych--  Cognition and judgment appear intact.  Cooperative with normal attention span and concentration.  Behavior appropriate. No anxious or depressed appearing.      Assessment      Assessment Diabetes HTN Hyperlipidemia Pain management: *Neuropathy (painful type):  ---08-3417: Normal Q22, folic acid, RPR. CK slightly elevated. NCS severe polyneuropathy ---Re-eval by neuro 06-2016: exam c/w neuropathy. Labs (-) ---intolerant  to lyrica ; intolerant to Elavil 04-2017 *MSK: h/o back surgery 2003 *Claudication:   Unremarkable ABIs 2017, back MRI 02-2016 (spinal stenosis not mention on the  report), saw Dr. Rita Ohara, symptoms not neurogenic.  Probably small vessel disease PVD. ABIs neg again 12-2016  CV: -CAD, CABG -Aortic insufficiency, dilated aortic root -Carotid artery disease 40-59% 10-2014, [place] 11-2015 : 1-39%, next per cards  -Ascending aortic aneurysm GU: BPH, bladder cancer Thyroid nodules: Small per [place]  03-2014 Cholelithiasis by CT 01-2014 Preglaucoma  PLAN: HTN: (Was on endocrine, had edema, switch to HCTZ on 09-2018 per  cardiology.)  Currently on Lotensin, hydralazine 3 times daily, HCTZ, Inderal.  BP upon arrival elevated, recheck 126/60.  At home consistently in the 130s.  Check a CMP, CBC, TSH.  No change Hyperlipidemia:On Crestor, check FLP Neuropathy: Parking permit signed.  Feels occasional imbalance, likely due to neuropathy, we discussed possibly neurology referral but will reassess in 3 months CAD, aortic disease: Saw cardiology 05/18/2019, felt to be stable Abnormal  liver per CT: Incidental finding, f/u US  11-2018: Question of fatty liver, 4.6 cm  right lobe liver cyst, report did not indicate the need to recheck ora follow-up. Vitamin D deficiency: Not on supplements, recommend vitamin D 1000 units daily, checking labs. Fatigue: Review of systems benign, checking labs today. Polypharmacy--does have several medications but all seem to be  needed.  Nevertheless will refer to our clinical pharmacist Rash: On the hand, probably fungal, recommend ketoconazole twice a day for 10 days. RTC CPX 3 months   This visit occurred during the SARS-CoV-2 public health emergency.  Safety protocols were in place, including screening questions prior to the visit, additional usage of staff PPE, and extensive cleaning of exam room while observing appropriate contact time as indicated for disinfecting solutions.

## 2019-07-19 NOTE — Progress Notes (Signed)
Pre visit review using our clinic review tool, if applicable. No additional management support is needed unless otherwise documented below in the visit note. 

## 2019-07-19 NOTE — Progress Notes (Signed)
°  Chronic Care Management   Outreach Note  07/19/2019 Name: William Faulkner MRN: [id number] DOB: 1934/12/11  Referred by: Colon Branch, MD Reason for referral : No chief complaint on file.   An unsuccessful telephone outreach was attempted today. The patient was referred to the pharmacist for assistance with care management and care coordination. This note is not being shared with the patient for the following reason: To respect privacy (The patient or proxy has requested that the information not be shared).  Follow Up Plan:   Earney [place] Upstream Scheduler

## 2019-07-19 NOTE — Patient Instructions (Addendum)
Please schedule Medicare Wellness with Glenard Haring.  Per our records you are due for an eye exam. Please contact your eye doctor to schedule an appointment. Please have them send copies of your office visit notes to [place]. Our fax number is [phone].   Continue same medications plan check your blood pressures regularly BP GOAL is between 110/65 and  135/85. If it is consistently higher or lower, let me know  Take vitamin D 1000 units daily  Apply the cream to your right hand for 10 days, twice a day  GO TO THE LAB : Get the blood work     [place], [place] back for   a physical exam in 3 months

## 2019-07-20 NOTE — Assessment & Plan Note (Signed)
HTN: (Was on endocrine, had edema, switch to HCTZ on 09-2018 per cardiology.)  Currently on Lotensin, hydralazine 3 times daily, HCTZ, Inderal.  BP upon arrival elevated, recheck 126/60.  At home consistently in the 130s.  Check a CMP, CBC, TSH.  No change Hyperlipidemia:On Crestor, check FLP Neuropathy: Parking permit signed.  Feels occasional imbalance, likely due to neuropathy, we discussed possibly neurology referral but will reassess in 3 months CAD, aortic disease: Saw cardiology 05/18/2019, felt to be stable Abnormal  liver per CT: Incidental finding, f/u US  11-2018: Question of fatty liver, 4.6 cm  right lobe liver cyst, report did not indicate the need to recheck ora follow-up. Vitamin D deficiency: Not on supplements, recommend vitamin D 1000 units daily, checking labs. Fatigue: Review of systems benign, checking labs today. Polypharmacy--does have several medications but all seem to be needed.  Nevertheless will refer to our clinical pharmacist Rash: On the hand, probably fungal, recommend ketoconazole twice a day for 10 days. RTC CPX 3 months

## 2019-07-21 ENCOUNTER — Telehealth: Payer: Self-pay | Admitting: Internal Medicine

## 2019-07-21 MED ORDER — METFORMIN HCL 850 MG PO TABS: 850.0000 mg | ORAL_TABLET | Freq: Two times a day (BID) | ORAL | 1 refills | Status: DC

## 2019-07-21 MED ORDER — BENAZEPRIL HCL 40 MG PO TABS: 20.0000 mg | ORAL_TABLET | Freq: Every day | ORAL | 1 refills | Status: DC

## 2019-07-21 MED ORDER — PROPRANOLOL HCL ER 80 MG PO CP24: 80.0000 mg | ORAL_CAPSULE | Freq: Every day | ORAL | 1 refills | Status: DC

## 2019-07-21 NOTE — Addendum Note (Signed)
Addended byDamita Dunnings D on: 07/21/2019 03:12 PM   Modules accepted: Orders

## 2019-07-21 NOTE — Progress Notes (Signed)
  Chronic Care Management   Note  07/21/2019 Name: TASEAN MANCHA MRN: [id number] DOB: 1935/01/18  Berman Grainger Dost is a 85 y.o. year old male who is a primary care patient of Colon Branch, MD. I reached out to [employer] by phone today in response to a referral sent by Mr. Angelina Ok Bram's PCP, Colon Branch, MD.   Mr. Vanosdol was given information about Chronic Care Management services today including:  1. CCM service includes personalized support from designated clinical staff supervised by his physician, including individualized plan of care and coordination with other care providers 2. 24/7 contact phone numbers for assistance for urgent and routine care needs. 3. Service will only be billed when office clinical staff spend 20 minutes or more in a month to coordinate care. 4. Only one practitioner may furnish and bill the service in a calendar month. 5. The patient may stop CCM services at any time (effective at the end of the month) by phone call to the office staff.   Patient agreed to services and verbal consent obtained.   Follow up plan:  [place]

## 2019-08-04 ENCOUNTER — Other Ambulatory Visit: Payer: Self-pay | Admitting: Internal Medicine

## 2019-08-04 ENCOUNTER — Other Ambulatory Visit: Payer: Medicare Other

## 2019-08-10 NOTE — Chronic Care Management (AMB) (Deleted)
Chronic Care Management Pharmacy  Name: SIXTO BOWDISH  MRN: [id number] DOB: 20-Apr-1934   Chief Complaint/ HPI  William Faulkner,  85 y.o. , male presents for their Initial CCM visit with the clinical pharmacist In office.  PCP : Colon Branch, MD Patient Care Team: Colon Branch, MD as PCP - General (Internal Medicine) Stanford Breed Denice Bors, MD as Consulting Physician (Cardiology) Netta Cedars, MD as Consulting Physician (Orthopedic Surgery) Suella Broad, MD as Consulting Physician (Physical Medicine and Rehabilitation) Carolan Clines, MD (Inactive) as Consulting Physician (Urology) Calvert Cantor, MD as Consulting Physician (Ophthalmology) Day, Melvenia Beam, [facility] as Pharmacist (Pharmacist)  Their chronic conditions include: Hypertension, Hyperlipidemia, Diabetes, Coronary Artery Disease, BPH and Chronic pain   Office Visits: 07/19/19: Patient presented to Dr. Larose Kells for follow-up. BP in clinic 126/60. Ketoconazole BID for 10 days for rash on hand. Benazepril decreased to 20 mg daily due to hyperkalemia and elevated Cr.   Consult Visit: 05/18/19: Patient presented to Dr. Stanford Breed (cardiology) for follow-up. No medication changes made.    Allergies  Allergen Reactions  . Atorvastatin Other (See Comments)    REACTION: increased cpk  . Celecoxib Other (See Comments)    REACTION: palpitations= celebrex  . Sulfonamide Derivatives Other (See Comments)    Unknown  . Zolpidem Tartrate Other (See Comments)    REACTION: made patient feel weird= ambien    Medications: Outpatient Encounter Medications as of 08/11/2019  Medication Sig  . aspirin 81 MG tablet Take 81 mg by mouth daily.    . benazepril (LOTENSIN) 40 MG tablet Take 0.5 tablets (20 mg total) by mouth daily.  . cholecalciferol (VITAMIN D) 1000 UNITS tablet Take 1,000 Units by mouth daily.  Marland Kitchen gabapentin (NEURONTIN) 600 MG tablet Take 1 tablet (600 mg total) by mouth 3 (three) times daily.  . hydrALAZINE (APRESOLINE) 25 MG  tablet Take 1 tablet (25 mg total) by mouth 3 (three) times daily.  . hydrochlorothiazide (HYDRODIURIL) 25 MG tablet TAKE 1 TABLET BY MOUTH EVERY DAY  . ketoconazole (NIZORAL) 2 % cream Apply 1 application topically daily.  . metFORMIN (GLUCOPHAGE) 850 MG tablet Take 1 tablet (850 mg total) by mouth 2 (two) times daily with a meal.  . propranolol ER (INDERAL LA) 80 MG 24 hr capsule Take 1 capsule (80 mg total) by mouth daily.  . rosuvastatin (CRESTOR) 20 MG tablet Take 1 tablet (20 mg total) by mouth daily.  . timolol (TIMOPTIC) 0.5 % ophthalmic solution Place 1 drop into both eyes every morning. As directed   No facility-administered encounter medications on file as of 08/11/2019.     Current Diagnosis/Assessment:    Goals Addressed   None    Diabetes   A1c goal {A1c goals:23924}  Recent Relevant Labs: Lab Results  Component Value Date/Time   HGBA1C 6.5 07/19/2019 10:26 AM   HGBA1C 6.6 (H) 10/05/2018 11:05 AM   GFR 38.41 (L) 07/19/2019 10:26 AM   GFR 47.30 (L) 10/20/2018 09:19 AM   MICROALBUR 1.2 03/05/2013 08:17 AM   MICROALBUR 0.2 08/12/2012 12:20 PM    Last diabetic Eye exam:  Lab Results  Component Value Date/Time   HMDIABEYEEXA Retinopathy (A) 03/17/2018 12:00 AM    Last diabetic Foot exam:  Lab Results  Component Value Date/Time   HMDIABFOOTEX Done 07/28/2014 12:00 AM     Checking BG: {CHL HP Blood Glucose Monitoring Frequency:[phone]}  Recent FBG Readings: *** Recent pre-meal BG readings: *** Recent 2hr PP BG readings:  *** Recent HS BG readings: ***  Patient has failed these meds in past: *** Patient is currently {CHL Controlled/[phone]} on the following medications: Marland Kitchen Metformin 850 mg BID   We discussed: {CHL HP Upstream Pharmacy discussion:[phone]}  Plan  Continue {CHL HP Upstream Pharmacy Plans:[phone]}  Hypertension   BP goal is:  {CHL HP UPSTREAM Pharmacist BP ranges:[phone]}  Office blood pressures are  BP  Readings from Last 3 Encounters:  07/19/19 126/60  05/18/19 138/80  11/30/18 (!) 137/43   Patient checks BP at home {CHL HP BP Monitoring Frequency:[phone]} Patient home BP readings are ranging: ***  Patient has failed these meds in the past: *** Patient is currently {CHL Controlled/[phone]} on the following medications:  . Benazepril 40 mg 0.5 tab daily  . Hydralazine 25 mg TID  . HCTZ 25 mg daily  . Propanolol ER 80 mg daily   We discussed {CHL HP Upstream Pharmacy discussion:[phone]}  Plan  Continue {CHL HP Upstream Pharmacy Plans:[phone]}   Hyperlipidemia   CABG 2011, aortic aneurysm 2017   LDL goal < ***  Lipid Panel     Component Value Date/Time   CHOL 115 07/19/2019 1026   CHOL 130 03/31/2018 0929   TRIG 175.0 (H) 07/19/2019 1026   HDL 35.50 (L) 07/19/2019 1026   HDL 38 (L) 03/31/2018 0929   LDLCALC 45 07/19/2019 1026   LDLCALC 52 03/31/2018 0929   LDLDIRECT 102.1 05/19/2007 1348    Hepatic Function Latest Ref Rng & Units 07/19/2019 03/31/2018 01/03/2017  Total Protein 6.0 - 8.3 g/dL 6.8 6.4 6.7  Albumin 3.5 - 5.2 g/dL 4.4 4.3 4.4  AST 0 - 37 U/L '17 18 20  ' ALT 0 - 53 U/L '15 16 18  ' Alk Phosphatase 39 - 117 U/L 48 50 45  Total Bilirubin 0.2 - 1.2 mg/dL 0.4 0.3 0.3  Bilirubin, Direct 0.00 - 0.40 mg/dL - 0.12 0.11     The ASCVD Risk score (Steen., et al., 2013) failed to calculate for the following reasons:   The 2013 ASCVD risk score is only valid for ages 69 to 77   Patient has failed these meds in past: *** Patient is currently {CHL Controlled/[phone]} on the following medications:  . Aspirin 81 mg daily  . Rosuvastatin 20 mg daily   We discussed:  {CHL HP Upstream Pharmacy discussion:[phone]}  Plan  Continue {CHL HP Upstream Pharmacy Plans:[phone]}  BPH   PSA  Date Value Ref Range Status  06/21/2015 2.64  Final  01/14/2007 1.20 0.10 - 4.00 ng/mL Final     Patient has failed these meds in  past: *** Patient is currently {CHL Controlled/[phone]} on the following medications:  . None  We discussed:  ***  Plan  Continue {CHL HP Upstream Pharmacy Plans:[phone]}  Chronic Pain / Neuropathy   History of neuropathy, chronic back pain, cervical spondylosis  Patient has failed these meds in past: *** Patient is currently {CHL Controlled/[phone]} on the following medications:  . Gabapentin 600 mg TID   We discussed:  ***  Plan  Continue {CHL HP Upstream Pharmacy Plans:[phone]}   Misc / OTC   Patient has failed these meds in past: *** Patient is currently {CHL Controlled/[phone]} on the following medications:  Marland Kitchen Vitamin D 1000 units daily  . Ketoconazole 2% cream . Timolol 0.5% ophth 1 drop both eyes qAM   We discussed:  ***  Plan  Continue {CHL HP Upstream Pharmacy BLTJQ:[id number]}  Vaccines   Reviewed and discussed patient's vaccination history.    Immunization History  Administered Date(s) Administered  .  Influenza Split 11/07/2010, 10/07/2011  . Influenza Whole 10/19/2008, 11/08/2009  . Influenza, High Dose Seasonal PF 09/28/2014, 10/23/2015  . Influenza,inj,Quad PF,6+ Mos 11/01/2013  . Influenza-Unspecified 11/01/2016, 11/07/2017, 09/29/2018  . PFIZER SARS-COV-2 Vaccination 03/14/2019, 04/07/2019  . Pneumococcal Conjugate-13 03/23/2014  . Pneumococcal Polysaccharide-23 01/10/2016  . Td 11/01/2013    Plan  Recommended patient receive *** vaccine in *** office.   Medication Management   Pt uses [facility] and [place] for all medications Uses pill box? {Yes or If no, why not?:20788} Pt endorses ***% compliance  We discussed: ***  Plan  {US Pharmacy UVHA:68934}    Follow up: *** month phone visit  ***

## 2019-08-11 ENCOUNTER — Other Ambulatory Visit: Payer: Self-pay

## 2019-08-11 ENCOUNTER — Ambulatory Visit: Payer: Medicare Other | Admitting: Pharmacist

## 2019-08-11 ENCOUNTER — Other Ambulatory Visit (INDEPENDENT_AMBULATORY_CARE_PROVIDER_SITE_OTHER): Payer: Medicare Other

## 2019-08-11 VITALS — BP 121/53 | HR 54 | Temp 98.3°F | Wt 218.4 lb

## 2019-08-11 DIAGNOSIS — I10 Essential (primary) hypertension: Secondary | ICD-10-CM

## 2019-08-11 DIAGNOSIS — E1140 Type 2 diabetes mellitus with diabetic neuropathy, unspecified: Secondary | ICD-10-CM

## 2019-08-11 DIAGNOSIS — E785 Hyperlipidemia, unspecified: Secondary | ICD-10-CM

## 2019-08-11 DIAGNOSIS — G629 Polyneuropathy, unspecified: Secondary | ICD-10-CM

## 2019-08-11 LAB — BASIC METABOLIC PANEL
BUN: 32 mg/dL — ABNORMAL HIGH (ref 6–23)
CO2: 28 mEq/L (ref 19–32)
Calcium: 9.8 mg/dL (ref 8.4–10.5)
Chloride: 104 mEq/L (ref 96–112)
Creatinine, Ser: 1.53 mg/dL — ABNORMAL HIGH (ref 0.40–1.50)
GFR: 43.66 mL/min — ABNORMAL LOW (ref >60.00)
Glucose, Bld: 88 mg/dL (ref 70–99)
Potassium: 5 mEq/L (ref 3.5–5.1)
Sodium: 138 mEq/L (ref 135–145)

## 2019-08-11 NOTE — Chronic Care Management (AMB) (Signed)
Chronic Care Management Pharmacy  Name: William Faulkner  MRN: [id number] DOB: Dec 17, 1934   Chief Complaint/ HPI  William Faulkner,  85 y.o. , male presents for their Initial CCM visit with the clinical pharmacist In office.  PCP : Colon Branch, MD Patient Care Team: Colon Branch, MD as PCP - General (Internal Medicine) Stanford Breed Denice Bors, MD as Consulting Physician (Cardiology) Netta Cedars, MD as Consulting Physician (Orthopedic Surgery) Suella Broad, MD as Consulting Physician (Physical Medicine and Rehabilitation) Carolan Clines, MD (Inactive) as Consulting Physician (Urology) Calvert Cantor, MD as Consulting Physician (Ophthalmology) Zekiah Caruth, Melvenia Beam, [facility] as Pharmacist (Pharmacist)  Their chronic conditions include: Diabetes, Hypertension, Hyperlipidemia/CVD, Chronic Pain/Neuropathy   Office Visits: 07/19/19: Patient presented to Dr. Larose Kells for follow-up. BP in clinic 126/60. Ketoconazole BID for 10 days for rash on hand. Benazepril decreased to 20 mg daily due to hyperkalemia and elevated Cr.   Consult Visit: 05/18/19: Patient presented to Dr. Stanford Breed (cardiology) for follow-up. No medication changes made.    Allergies  Allergen Reactions  . Atorvastatin Other (See Comments)    REACTION: increased cpk  . Celecoxib Other (See Comments)    REACTION: palpitations= celebrex  . Sulfonamide Derivatives Other (See Comments)    Unknown  . Zolpidem Tartrate Other (See Comments)    REACTION: made patient feel weird= ambien    Medications: Outpatient Encounter Medications as of 08/11/2019  Medication Sig  . aspirin 81 MG tablet Take 81 mg by mouth daily.    . benazepril (LOTENSIN) 40 MG tablet Take 0.5 tablets (20 mg total) by mouth daily.  . cholecalciferol (VITAMIN D) 1000 UNITS tablet Take 1,000 Units by mouth daily.  Marland Kitchen gabapentin (NEURONTIN) 600 MG tablet Take 1 tablet (600 mg total) by mouth 3 (three) times daily.  . hydrALAZINE (APRESOLINE) 25 MG tablet Take 1 tablet  (25 mg total) by mouth 3 (three) times daily.  . hydrochlorothiazide (HYDRODIURIL) 25 MG tablet TAKE 1 TABLET BY MOUTH EVERY Keyle Doby  . ketoconazole (NIZORAL) 2 % cream Apply 1 application topically daily.  . metFORMIN (GLUCOPHAGE) 850 MG tablet Take 1 tablet (850 mg total) by mouth 2 (two) times daily with a meal.  . propranolol ER (INDERAL LA) 80 MG 24 hr capsule Take 1 capsule (80 mg total) by mouth daily.  . rosuvastatin (CRESTOR) 20 MG tablet Take 1 tablet (20 mg total) by mouth daily.  . timolol (TIMOPTIC) 0.5 % ophthalmic solution Place 1 drop into both eyes every morning. As directed   No facility-administered encounter medications on file as of 08/11/2019.     Current Diagnosis/Assessment:    Goals Addressed            This Visit's Progress   . Chronic Care Management Pharmacy Care Plan       CARE PLAN ENTRY (see longitudinal plan of care for additional care plan information)  Current Barriers:  . Chronic Disease Management support, education, and care coordination needs related to Diabetes, Hypertension, Hyperlipidemia/CVD, Chronic Pain/Neuropathy    Hypertension BP Readings from Last 3 Encounters:  08/11/19 (!) 121/53  07/19/19 126/60  05/18/19 138/80   . Pharmacist Clinical Goal(s): o Over the next 90 days, patient will work with PharmD and providers to maintain BP goal <140/90 . Current regimen:  . Benazepril 40 mg 0.5 tab daily  . Hydralazine 25 mg TID (taking twice daily) . HCTZ 25 mg daily  . Propanolol ER 80 mg daily . Interventions: o Discussed ways to decrease polypharmacy - Consider combining  benazepril/hctz - Consider decreasing hydralazine per Dr. Stanford Breed approval o Requested patient to check blood pressure 3-4 times per week . Patient self care activities - Over the next 90 days, patient will: o Check BP 3-4 times per week, document, and provide at future appointments o Ensure daily salt intake < 2300 mg/Nur Krasinski  Hyperlipidemia/CVD Lab Results    Component Value Date/Time   LDLCALC 45 07/19/2019 10:26 AM   LDLCALC 52 03/31/2018 09:29 AM   LDLDIRECT 102.1 05/19/2007 01:48 PM   . Pharmacist Clinical Goal(s): o Over the next 90 days, patient will work with PharmD and providers to maintain LDL goal < 70 . Current regimen:  . Aspirin 81 mg daily  . Rosuvastatin 20 mg daily . Patient self care activities - Over the next 90 days, patient will: o Maintain cholesterol medication regimen.   Diabetes Lab Results  Component Value Date/Time   HGBA1C 6.5 07/19/2019 10:26 AM   HGBA1C 6.6 (H) 10/05/2018 11:05 AM   . Pharmacist Clinical Goal(s): o Over the next 90 days, patient will work with PharmD and providers to maintain A1c goal <7% . Current regimen:  o Metformin 850 mg BID  . Interventions: o Collaboration with provider regarding medication management (reducing metformin dose) . Patient self care activities - Over the next 90 days, patient will: o Maintain a1c <7%  Chronic Care/Neuropathy . Pharmacist Clinical Goal(s) o Over the next 90 days, patient will work with PharmD and providers to reduce symptoms of chronic pain and neuropathy . Current regimen:  o Gabapentin 600 mg TID (taking once daily) . Interventions: o Discussed possibilities of patient's pain o Collaboration with provider regarding medication management (evaluation for intermittent claudication and neuropathy options) . Patient self care activities - Over the next 90 days, patient will: o Maintain pain/neuropathy medication regimen.   Medication management . Pharmacist Clinical Goal(s): o Over the next 90 days, patient will work with PharmD and providers to maintain optimal medication adherence . Current pharmacy: [facility] . Interventions o Comprehensive medication review performed. o Continue current medication management strategy . Patient self care activities - Over the next 90 days, patient will: o Focus on medication adherence by filling and taking  medications appropriately  o Take medications as prescribed o Report any questions or concerns to PharmD and/or provider(s)  Initial goal documentation       Social Hx:  Here with his wife, William Faulkner.  They have 3 kids, 8 grandkids, and 1 great grandkid.  He feels his biggest concerns are back pain, polypharmacy, and limitations to physical activity.   Diabetes   A1c goal <7% (could even consider <8% considering age and desire for less medication)  Recent Relevant Labs: Lab Results  Component Value Date/Time   HGBA1C 6.5 07/19/2019 10:26 AM   HGBA1C 6.6 (H) 10/05/2018 11:05 AM   GFR 43.66 (L) 08/11/2019 01:24 PM   GFR 38.41 (L) 07/19/2019 10:26 AM   MICROALBUR 1.2 03/05/2013 08:17 AM   MICROALBUR 0.2 08/12/2012 12:20 PM    Last diabetic Eye exam:  Lab Results  Component Value Date/Time   HMDIABEYEEXA Retinopathy (A) 03/17/2018 12:00 AM    Last diabetic Foot exam:  Lab Results  Component Value Date/Time   HMDIABFOOTEX Done 07/28/2014 12:00 AM     Checking BG: Never  Patient has failed these meds in past: janumet (cost) Patient is currently controlled on the following medications: Marland Kitchen Metformin 850 mg BID   We discussed: DM goals noting patient's age and desire for less medication  Plan -Consider decreasing metformin to 537m twice daily per Dr. PLarose Kellsagreement  Future Plan -Check a1c in 3 months and if <7% consider further deprescribing to metformin 506monce daily -Check a1c another 3 months if a1c <7-7.5% consider further deprescribing to diet and exercise controlled diabetes  Hypertension   BP goal is:  <140/90  Office blood pressures are  BP Readings from Last 3 Encounters:  08/11/19 (!) 121/53  07/19/19 126/60  05/18/19 138/80   Patient checks BP at home infrequently Patient home BP readings are ranging: Unable to assess  Patient has failed these meds in the past: None noted  Patient is currently controlled on the following medications:  . Benazepril  40 mg 0.5 tab daily  . Hydralazine 25 mg TID (pt only taking BID; started 03/31/18 per Dr. CrStanford Breedor hypertension) . HCTZ 25 mg daily  . Propanolol ER 80 mg daily (more so for tremor)  Patient seems to have controlled BP and is interested in decreasing polypharmacy. Considering TID dosing of hydralazine noting that patient only takes it BID already, could consider D/Cing hydralazine if BP remains at goal per Dr. CrStanford Breedgreement.  Denies chest pain  Discussed possibility of combining benazepril and hctz in a combo pill. Pt would like to wait until benazepril dose is stabilized since it was recently reduced to 1/2 tab before being prescribed a combo pill.   We discussed BP goals  Plan -Continue current medications  -Check blood pressure 3-4 times per week and record  Future Plans -Consider decreasing hydralazine to 2537maily, if BP at goal consider D/C hydralazine -Consider combining benazepril and hctz to reduce pill burden (benazepril/hctz 20/38m78mily)  Hyperlipidemia/CVD   CABG 2011, aortic aneurysm 2017   LDL goal < 70  Lipid Panel     Component Value Date/Time   CHOL 115 07/19/2019 1026   CHOL 130 03/31/2018 0929   TRIG 175.0 (H) 07/19/2019 1026   HDL 35.50 (L) 07/19/2019 1026   HDL 38 (L) 03/31/2018 0929   LDLCALC 45 07/19/2019 1026   LDLCALC 52 03/31/2018 0929   LDLDIRECT 102.1 05/19/2007 1348    Hepatic Function Latest Ref Rng & Units 07/19/2019 03/31/2018 01/03/2017  Total Protein 6.0 - 8.3 g/dL 6.8 6.4 6.7  Albumin 3.5 - 5.2 g/dL 4.4 4.3 4.4  AST 0 - 37 U/L _0 ALT 0 - 53 U/L _1 Alk Phosphatase 39 - 117 U/L 48 50 45  Total Bilirubin 0.2 - 1.2 mg/dL 0.4 0.3 0.3  Bilirubin, Direct 0.00 - 0.40 mg/dL - 0.12 0.11     The ASCVD Risk score ([place] al., 2013) failed to calculate for the following reasons:   The 2013 ASCVD risk score is only valid for ages 40 t4079  14atient has failed these meds in past: None noted Patient is currently  controlled on the following medications:  . Aspirin 81 mg daily  . Rosuvastatin 20 mg daily   Plan -Continue current medications   Chronic Pain / Neuropathy   History of neuropathy, chronic back pain, cervical spondylosis  Patient has failed these meds in past: amitriptyline, lyrica Patient is currently uncontrolled on the following medications:  . Gabapentin 600 mg TID (patient only taking once daily due to swelling)  On a scale of 1-10 states his neuropathic pain is a 3  Patient does not recall what his intolerance to amitriptyline was. His wife reports he developed significant leg swelling when taking gabapentin TID.  When pt self titrated down to one tablet daily the swelling resolved. Patient still reports neuropathy and would like to explore other options.   Pt also reports that when he does significant walking his legs will "freeze up". He will be unable to move until the episode resolves. Luckily it resolves with time and he is able to continue moving. He wonders what this is. Intermittent claudication??  Plan -Consider evaluation for intermittent claudication  -Discuss evaluation/treatment options with Dr. Larose Kells  Future Plan -If intermittent claudication consider cilostazol assuring patient has no history of heart failure -If neuropathy consider initiating duloxetine   Misc / OTC   . Vitamin D 1000 units daily  . Ketoconazole 2% cream . Timolol 0.5% ophth 1 drop both eyes qAM   Plan -Continue current medications  Vaccines   Reviewed and discussed patient's vaccination history.    Immunization History  Administered Date(s) Administered  . Influenza Split 11/07/2010, 10/07/2011  . Influenza Whole 10/19/2008, 11/08/2009  . Influenza, High Dose Seasonal PF 09/28/2014, 10/23/2015  . Influenza,inj,Quad PF,6+ Mos 11/01/2013  . Influenza-Unspecified 11/01/2016, 11/07/2017, 09/29/2018  . PFIZER SARS-COV-2 Vaccination 03/14/2019, 04/07/2019  . Pneumococcal  Conjugate-13 03/23/2014  . Pneumococcal Polysaccharide-23 01/10/2016  . Td 11/01/2013    Plan -Recommended patient receive Shingrix vaccine in pharmacy.

## 2019-08-14 NOTE — Patient Instructions (Addendum)
Visit Information  Goals Addressed            This Visit's Progress   . Chronic Care Management Pharmacy Care Plan       CARE PLAN ENTRY (see longitudinal plan of care for additional care plan information)  Current Barriers:  . Chronic Disease Management support, education, and care coordination needs related to Diabetes, Hypertension, Hyperlipidemia/CVD, Chronic Pain/Neuropathy    Hypertension BP Readings from Last 3 Encounters:  08/11/19 (!) 121/53  07/19/19 126/60  05/18/19 138/80   . Pharmacist Clinical Goal(s): o Over the next 90 days, patient will work with PharmD and providers to maintain BP goal <140/90 . Current regimen:  . Benazepril 40 mg 0.5 tab daily  . Hydralazine 25 mg TID (taking twice daily) . HCTZ 25 mg daily  . Propanolol ER 80 mg daily . Interventions: o Discussed ways to decrease polypharmacy - Consider combining benazepril/hctz - Consider decreasing hydralazine per Dr. Stanford Breed approval o Requested patient to check blood pressure 3-4 times per week . Patient self care activities - Over the next 90 days, patient will: o Check BP 3-4 times per week, document, and provide at future appointments o Ensure daily salt intake < 2300 mg/William Faulkner  Hyperlipidemia/CVD Lab Results  Component Value Date/Time   LDLCALC 45 07/19/2019 10:26 AM   LDLCALC 52 03/31/2018 09:29 AM   LDLDIRECT 102.1 05/19/2007 01:48 PM   . Pharmacist Clinical Goal(s): o Over the next 90 days, patient will work with PharmD and providers to maintain LDL goal < 70 . Current regimen:  . Aspirin 81 mg daily  . Rosuvastatin 20 mg daily . Patient self care activities - Over the next 90 days, patient will: o Maintain cholesterol medication regimen.   Diabetes Lab Results  Component Value Date/Time   HGBA1C 6.5 07/19/2019 10:26 AM   HGBA1C 6.6 (H) 10/05/2018 11:05 AM   . Pharmacist Clinical Goal(s): o Over the next 90 days, patient will work with PharmD and providers to maintain A1c goal  <7% . Current regimen:  o Metformin 850 mg BID  . Interventions: o Collaboration with provider regarding medication management (reducing metformin dose) . Patient self care activities - Over the next 90 days, patient will: o Maintain a1c <7%  Chronic Care/Neuropathy . Pharmacist Clinical Goal(s) o Over the next 90 days, patient will work with PharmD and providers to reduce symptoms of chronic pain and neuropathy . Current regimen:  o Gabapentin 600 mg TID (taking once daily) . Interventions: o Discussed possibilities of patient's pain o Collaboration with provider regarding medication management (evaluation for intermittent claudication and neuropathy options) . Patient self care activities - Over the next 90 days, patient will: o Maintain pain/neuropathy medication regimen.   Medication management . Pharmacist Clinical Goal(s): o Over the next 90 days, patient will work with PharmD and providers to maintain optimal medication adherence . [facility] . Interventions o Comprehensive medication review performed. o Continue current medication management strategy . Patient self care activities - Over the next 90 days, patient will: o Focus on medication adherence by filling and taking medications appropriately  o Take medications as prescribed o Report any questions or concerns to PharmD and/or provider(s)  Initial goal documentation        William Faulkner was given information about Chronic Care Management services today including:  1. CCM service includes personalized support from designated clinical staff supervised by his physician, including individualized plan of care and coordination with other care providers 2. 24/7 contact phone  numbers for assistance for urgent and routine care needs. 3. Standard insurance, coinsurance, copays and deductibles apply for chronic care management only during months in which we provide at least 20 minutes of these services. Most  insurances cover these services at 100%, however patients may be responsible for any copay, coinsurance and/or deductible if applicable. This service may help you avoid the need for more expensive face-to-face services. 4. Only one practitioner may furnish and bill the service in a calendar month. 5. The patient may stop CCM services at any time (effective at the end of the month) by phone call to the office staff.  Patient agreed to services and verbal consent obtained.   The patient verbalized understanding of instructions provided today and agreed to receive a mailed copy of patient instruction and/or educational materials. Telephone follow up appointment with pharmacy team member scheduled for: 10/14/2019   Melvenia Beam Jaymond Waage, PharmD Clinical Pharmacist [facility] [phone]   How to Take Your Blood Pressure You can take your blood pressure at home with a machine. You may need to check your blood pressure at home:  To check if you have high blood pressure (hypertension).  To check your blood pressure over time.  To make sure your blood pressure medicine is working. Supplies needed: You will need a blood pressure machine, or monitor. You can buy one at a drugstore or online. When choosing one:  Choose one with an arm cuff.  Choose one that wraps around your upper arm. Only one finger should fit between your arm and the cuff.  Do not choose one that measures your blood pressure from your wrist or finger. Your doctor can suggest a monitor. How to prepare Avoid these things for 30 minutes before checking your blood pressure:  Drinking caffeine.  Drinking alcohol.  Eating.  Smoking.  Exercising. Five minutes before checking your blood pressure:  Pee.  Sit in a dining chair. Avoid sitting in a soft couch or armchair.  Be quiet. Do not talk. How to take your blood pressure Follow the instructions that came with your machine. If you have a  digital blood pressure monitor, these may be the instructions: 1. Sit up straight. 2. Place your feet on the floor. Do not cross your ankles or legs. 3. Rest your left arm at the level of your heart. You may rest it on a table, desk, or chair. 4. Pull up your shirt sleeve. 5. Wrap the blood pressure cuff around the upper part of your left arm. The cuff should be 1 inch (2.5 cm) above your elbow. It is best to wrap the cuff around bare skin. 6. Fit the cuff snugly around your arm. You should be able to place only one finger between the cuff and your arm. 7. Put the cord inside the groove of your elbow. 8. Press the power button. 9. Sit quietly while the cuff fills with air and loses air. 10. Write down the numbers on the screen. 11. Wait 2-3 minutes and then repeat steps 1-10. What do the numbers mean? Two numbers make up your blood pressure. The first number is called systolic pressure. The second is called diastolic pressure. An example of a blood pressure reading is "120 over 80" (or 120/80). If you are an adult and do not have a medical condition, use this guide to find out if your blood pressure is normal: Normal  First number: below 120.  Second number: below 80. Elevated  First number:  120-129.  Second number: below 80. Hypertension stage 1  First number: 130-139.  Second number: 80-89. Hypertension stage 2  First number: 140 or above.  Second number: 68 or above. Your blood pressure is above normal even if only the top or bottom number is above normal. Follow these instructions at home:  Check your blood pressure as often as your doctor tells you to.  Take your monitor to your next doctor's appointment. Your doctor will: ? Make sure you are using it correctly. ? Make sure it is working right.  Make sure you understand what your blood pressure numbers should be.  Tell your doctor if your medicines are causing side effects. Contact a doctor if:  Your blood  pressure keeps being high. Get help right away if:  Your first blood pressure number is higher than 180.  Your second blood pressure number is higher than 120. This information is not intended to replace advice given to you by your health care provider. Make sure you discuss any questions you have with your health care provider. Document Revised: 12/20/2016 Document Reviewed: 06/16/2015 Elsevier Patient Education  2020 [employer].

## 2019-09-15 ENCOUNTER — Telehealth: Payer: Self-pay | Admitting: Internal Medicine

## 2019-09-15 DIAGNOSIS — R21 Rash and other nonspecific skin eruption: Secondary | ICD-10-CM

## 2019-09-15 NOTE — Telephone Encounter (Signed)
Pt's wife said irritation on hand has spread. She think he needs to see dermatologist. She prefers not to see Dr. Nevada Crane but anywhere in [facility] area.

## 2019-09-15 NOTE — Telephone Encounter (Signed)
Pt wife said he always has irritation on his skin and Dr. Larose Kells prescribes ketoconazole cream. She really thinks that Dr. Larose Kells will send a referral for dermatologist. Please call patient.

## 2019-09-15 NOTE — Telephone Encounter (Signed)
Please advise 

## 2019-09-15 NOTE — Telephone Encounter (Signed)
If the rash is much worse schedule a visit here. If not, refer to dermatology, DX hand rash

## 2019-09-15 NOTE — Telephone Encounter (Signed)
We have not seen him for a "place on his hand"- he will need as visit here first as it may take several months to get in w/ dermatology.

## 2019-09-15 NOTE — Telephone Encounter (Signed)
Spoke w/ Flora- rash not much worse but they feel like it is spreading. Agreed to a referral to [facility].

## 2019-10-08 ENCOUNTER — Encounter: Payer: Self-pay | Admitting: Pharmacist

## 2019-10-08 NOTE — Telephone Encounter (Cosign Needed)
A user error has taken place.

## 2019-10-12 ENCOUNTER — Other Ambulatory Visit: Payer: Self-pay | Admitting: Internal Medicine

## 2019-10-12 NOTE — Progress Notes (Signed)
Erroneous Encounter

## 2019-10-14 ENCOUNTER — Telehealth: Payer: Medicare Other

## 2019-10-21 ENCOUNTER — Other Ambulatory Visit: Payer: Self-pay | Admitting: Cardiology

## 2019-10-29 ENCOUNTER — Telehealth: Payer: Self-pay | Admitting: Pharmacist

## 2019-10-29 NOTE — Progress Notes (Addendum)
Chronic Care Management Pharmacy Assistant   Name: William Faulkner  MRN: [id number] DOB: 07-16-1934  Reason for Encounter: General Adherence Call  Patient Questions:  1.  Have you seen any other providers since your last visit? No  2.  Any changes in your medicines or health? No  PCP : Colon Branch, MD   Their chronic conditions include: Diabetes, Hypertension, Hyperlipidemia/CVD, Chronic Pain/Neuropathy.  Office Visits: None since their last CCM visit with the clinical pharmacist on 08-11-2019.  Consults:  None since their last CCM visit with the clinical pharmacist on 08-11-2019.  Allergies:   Allergies  Allergen Reactions  . Atorvastatin Other (See Comments)    REACTION: increased cpk  . Celecoxib Other (See Comments)    REACTION: palpitations= celebrex  . Sulfonamide Derivatives Other (See Comments)    Unknown  . Zolpidem Tartrate Other (See Comments)    REACTION: made patient feel weird= ambien    Medications: Outpatient Encounter Medications as of 10/29/2019  Medication Sig  . aspirin 81 MG tablet Take 81 mg by mouth daily.    . benazepril (LOTENSIN) 40 MG tablet Take 0.5 tablets (20 mg total) by mouth daily.  . cholecalciferol (VITAMIN D) 1000 UNITS tablet Take 1,000 Units by mouth daily.  Marland Kitchen gabapentin (NEURONTIN) 600 MG tablet Take 1 tablet (600 mg total) by mouth 3 (three) times daily.  . hydrALAZINE (APRESOLINE) 25 MG tablet Take 1 tablet (25 mg total) by mouth 3 (three) times daily.  . hydrochlorothiazide (HYDRODIURIL) 25 MG tablet TAKE 1 TABLET BY MOUTH  DAILY  . ketoconazole (NIZORAL) 2 % cream Apply 1 application topically daily.  . metFORMIN (GLUCOPHAGE) 850 MG tablet Take 1 tablet (850 mg total) by mouth 2 (two) times daily with a meal.  . propranolol ER (INDERAL LA) 80 MG 24 hr capsule Take 1 capsule (80 mg total) by mouth daily.  . rosuvastatin (CRESTOR) 20 MG tablet Take 1 tablet (20 mg total) by mouth daily.  . timolol (TIMOPTIC) 0.5 % ophthalmic  solution Place 1 drop into both eyes every morning. As directed   No facility-administered encounter medications on file as of 10/29/2019.    Current Diagnosis: Patient Active Problem List   Diagnosis Date Noted  . Chronic back pain 11/21/2016  . Neuropathy 11/21/2016  . Benign essential tremor 07/11/2016  . Follow-up -------------PCP NOTES 09/28/2014  . Dizziness 08/10/2014  . Cholelithiasis 03/24/2014  . Annual physical exam 11/01/2013  . Tremor 03/03/2013  . Cervical spondylosis 12/17/2012  . Inguinal hernia unilateral, non-recurrent, right 08/26/2011  . Aortic aneurysm ([place]) 01/03/2010  . CAD, ARTERY BYPASS GRAFT 06/20/2009  . ANGINA DECUBITUS 06/02/2009  . Cerebrovascular disease 06/02/2009  . Diabetes and neuropathy 03/14/2008  . NEOPLASM, MALIGNANT, BLADDER, HX OF 12/21/2007  . BPH (benign prostatic hyperplasia) 01/12/2007  . Hyperlipidemia 12/17/2006  . HTN (hypertension) 12/17/2006  . Aortic valve disorder 08/18/2006    Goals Addressed   None    Made outbound call to the patient for a General Adherence Call. Spoke with patient's wife. Verified the patient's wife William Faulkner) was listed on the HIPAA and  with two identifying pieces of information before proceeding with the call. I inquired if an appointment was made with the patient's care team to review the medication changes discussed during his initial visit in July. Flora stated the patient was "dealing with other issues" at the moment and did not want to make any changes to his medication. She was working on confirming an appointment with  the cardiologist in November. She also stated she was advised to make an appointment with Dr. Larose Kells to discuss potential medication changes, but she has not made that appointment yet.   Follow-Up:  Pharmacist Review   Fanny Skates, [place] Pharmacist Assistant [phone]  Reviewed by: De Blanch, PharmD Clinical Pharmacist [facility] [phone]

## 2019-11-01 ENCOUNTER — Encounter: Payer: Self-pay | Admitting: Family Medicine

## 2019-11-01 ENCOUNTER — Ambulatory Visit: Payer: Medicare Other | Admitting: Family Medicine

## 2019-11-01 ENCOUNTER — Other Ambulatory Visit: Payer: Self-pay

## 2019-11-01 VITALS — BP 124/68 | HR 89 | Temp 98.0°F | Ht 74.0 in | Wt 218.0 lb

## 2019-11-01 DIAGNOSIS — M542 Cervicalgia: Secondary | ICD-10-CM | POA: Diagnosis not present

## 2019-11-01 NOTE — Patient Instructions (Addendum)
OK to take Tylenol 1000 mg (2 extra strength tabs) or 975 mg (3 regular strength tabs) every 6 hours as needed.  Ice/cold pack over area for 10-15 min twice daily.  Heat (pad or rice pillow in microwave) over affected area, 10-15 minutes twice daily.   Send me a message in 3-4 weeks if we are not turning the corner. We will set you up with physical therapy.    EXERCISES RANGE OF MOTION (ROM) AND STRETCHING EXERCISES  These exercises may help you when beginning to rehabilitate your issue. In order to successfully resolve your symptoms, you must improve your posture. These exercises are designed to help reduce the forward-head and rounded-shoulder posture which contributes to this condition. Your symptoms may resolve with or without further involvement from your physician, physical therapist or athletic trainer. While completing these exercises, remember:   Restoring tissue flexibility helps normal motion to return to the joints. This allows healthier, less painful movement and activity.  An effective stretch should be held for at least 20 seconds, although you may need to begin with shorter hold times for comfort.  A stretch should never be painful. You should only feel a gentle lengthening or release in the stretched tissue.  Do not do any stretch or exercise that you cannot tolerate.  STRETCH- Axial Extensors  Lie on your back on the floor. You may bend your knees for comfort. Place a rolled-up hand towel or dish towel, about 2 inches in diameter, under the part of your head that makes contact with the floor.  Gently tuck your chin, as if trying to make a "double chin," until you feel a gentle stretch at the base of your head.  Hold 15-20 seconds. Repeat 2-3 times. Complete this exercise 1 time per day.   STRETCH - Axial Extension   Stand or sit on a firm surface. Assume a good posture: chest up, shoulders drawn back, abdominal muscles slightly tense, knees unlocked (if standing) and  feet hip width apart.  Slowly retract your chin so your head slides back and your chin slightly lowers. Continue to look straight ahead.  You should feel a gentle stretch in the back of your head. Be certain not to feel an aggressive stretch since this can cause headaches later.  Hold for 15-20 seconds. Repeat 2-3 times. Complete this exercise 1 time per day.  STRETCH - Cervical Side Bend   Stand or sit on a firm surface. Assume a good posture: chest up, shoulders drawn back, abdominal muscles slightly tense, knees unlocked (if standing) and feet hip width apart.  Without letting your nose or shoulders move, slowly tip your right / left ear to your shoulder until your feel a gentle stretch in the muscles on the opposite side of your neck.  Hold 15-20 seconds. Repeat 2-3 times. Complete this exercise 1-2 times per day.  STRETCH - Cervical Rotators   Stand or sit on a firm surface. Assume a good posture: chest up, shoulders drawn back, abdominal muscles slightly tense, knees unlocked (if standing) and feet hip width apart.  Keeping your eyes level with the ground, slowly turn your head until you feel a gentle stretch along the back and opposite side of your neck.  Hold 15-20 seconds. Repeat 2-3 times. Complete this exercise 1-2 times per day.  RANGE OF MOTION - Neck Circles   Stand or sit on a firm surface. Assume a good posture: chest up, shoulders drawn back, abdominal muscles slightly tense, knees unlocked (if standing)  and feet hip width apart.  Gently roll your head down and around from the back of one shoulder to the back of the other. The motion should never be forced or painful.  Repeat the motion 10-20 times, or until you feel the neck muscles relax and loosen. Repeat 2-3 times. Complete the exercise 1-2 times per day. STRENGTHENING EXERCISES - Cervical Strain and Sprain These exercises may help you when beginning to rehabilitate your injury. They may resolve your symptoms  with or without further involvement from your physician, physical therapist, or athletic trainer. While completing these exercises, remember:   Muscles can gain both the endurance and the strength needed for everyday activities through controlled exercises.  Complete these exercises as instructed by your physician, physical therapist, or athletic trainer. Progress the resistance and repetitions only as guided.  You may experience muscle soreness or fatigue, but the pain or discomfort you are trying to eliminate should never worsen during these exercises. If this pain does worsen, stop and make certain you are following the directions exactly. If the pain is still present after adjustments, discontinue the exercise until you can discuss the trouble with your clinician.  STRENGTH - Cervical Flexors, Isometric  Face a wall, standing about 6 inches away. Place a small pillow, a ball about 6-8 inches in diameter, or a folded towel between your forehead and the wall.  Slightly tuck your chin and gently push your forehead into the soft object. Push only with mild to moderate intensity, building up tension gradually. Keep your jaw and forehead relaxed.  Hold 10 to 20 seconds. Keep your breathing relaxed.  Release the tension slowly. Relax your neck muscles completely before you start the next repetition. Repeat 2-3 times. Complete this exercise 1 time per day.  STRENGTH- Cervical Lateral Flexors, Isometric   Stand about 6 inches away from a wall. Place a small pillow, a ball about 6-8 inches in diameter, or a folded towel between the side of your head and the wall.  Slightly tuck your chin and gently tilt your head into the soft object. Push only with mild to moderate intensity, building up tension gradually. Keep your jaw and forehead relaxed.  Hold 10 to 20 seconds. Keep your breathing relaxed.  Release the tension slowly. Relax your neck muscles completely before you start the next  repetition. Repeat 2-3 times. Complete this exercise 1 time per day.  STRENGTH - Cervical Extensors, Isometric   Stand about 6 inches away from a wall. Place a small pillow, a ball about 6-8 inches in diameter, or a folded towel between the back of your head and the wall.  Slightly tuck your chin and gently tilt your head back into the soft object. Push only with mild to moderate intensity, building up tension gradually. Keep your jaw and forehead relaxed.  Hold 10 to 20 seconds. Keep your breathing relaxed.  Release the tension slowly. Relax your neck muscles completely before you start the next repetition. Repeat 2-3 times. Complete this exercise 1 time per day.  POSTURE AND BODY MECHANICS CONSIDERATIONS Keeping correct posture when sitting, standing or completing your activities will reduce the stress put on different body tissues, allowing injured tissues a chance to heal and limiting painful experiences. The following are general guidelines for improved posture. Your physician or physical therapist will provide you with any instructions specific to your needs. While reading these guidelines, remember:  The exercises prescribed by your provider will help you have the flexibility and strength to maintain  correct postures.  The correct posture provides the optimal environment for your joints to work. All of your joints have less wear and tear when properly supported by a spine with good posture. This means you will experience a healthier, less painful body.  Correct posture must be practiced with all of your activities, especially prolonged sitting and standing. Correct posture is as important when doing repetitive low-stress activities (typing) as it is when doing a single heavy-load activity (lifting).  PROLONGED STANDING WHILE SLIGHTLY LEANING FORWARD When completing a task that requires you to lean forward while standing in one place for a long time, place either foot up on a stationary  2- to 4-inch high object to help maintain the best posture. When both feet are on the ground, the low back tends to lose its slight inward curve. If this curve flattens (or becomes too large), then the back and your other joints will experience too much stress, fatigue more quickly, and can cause pain.   RESTING POSITIONS Consider which positions are most painful for you when choosing a resting position. If you have pain with flexion-based activities (sitting, bending, stooping, squatting), choose a position that allows you to rest in a less flexed posture. You would want to avoid curling into a fetal position on your side. If your pain worsens with extension-based activities (prolonged standing, working overhead), avoid resting in an extended position such as sleeping on your stomach. Most people will find more comfort when they rest with their spine in a more neutral position, neither too rounded nor too arched. Lying on a non-sagging bed on your side with a pillow between your knees, or on your back with a pillow under your knees will often provide some relief. Keep in mind, being in any one position for a prolonged period of time, no matter how correct your posture, can still lead to stiffness.  WALKING Walk with an upright posture. Your ears, shoulders, and hips should all line up. OFFICE WORK When working at a desk, create an environment that supports good, upright posture. Without extra support, muscles fatigue and lead to excessive strain on joints and other tissues.  CHAIR:  A chair should be able to slide under your desk when your back makes contact with the back of the chair. This allows you to work closely.  The chair's height should allow your eyes to be level with the upper part of your monitor and your hands to be slightly lower than your elbows.  Body position: ? Your feet should make contact with the floor. If this is not possible, use a foot rest. ? Keep your ears over your  shoulders. This will reduce stress on your neck and low back.

## 2019-11-01 NOTE — Progress Notes (Signed)
Musculoskeletal Exam  Patient: William Faulkner DOB: Nov 27, 1934  DOS: 11/01/2019  SUBJECTIVE:  Chief Complaint:   Chief Complaint  Patient presents with  . Neck Pain    William Faulkner is a 85 y.o.  male for evaluation and treatment of neck pain.  He is here with his wife.  Onset:  3 months ago. No inj or change in activity.  Location: R side of neck Character:  tightening Progression of issue:  is unchanged Associated symptoms: no swelling, redness, bruising Treatment: to date has been acetaminophen and heat, Aspercreme.   Neurovascular symptoms: no  Past Medical History:  Diagnosis Date  . AORTIC ANEUR UNSPEC SITE WITHOUT MENTION RUPTURE   . AORTIC INSUFFICIENCY   . BPH (benign prostatic hyperplasia)   . CAD, ARTERY BYPASS GRAFT   . Cancer ([place])   . CEREBROVASCULAR DISEASE   . DIAB W/O COMP TYPE II/UNS NOT STATED UNCNTRL   . Hyperlipidemia   . HYPERTENSION, ESSENTIAL NOS   . NEOPLASM, MALIGNANT, BLADDER, HX OF   . Rotator cuff tear    Left shoulder,     Objective: VITAL SIGNS: BP 124/68 (BP Location: Left Arm, Patient Position: Sitting, Cuff Size: Normal)   Pulse 89   Temp 98 F (36.7 C) (Oral)   Ht 6\' 2"  (1.88 m)   Wt 218 lb (98.9 kg)   SpO2 99%   BMI 27.99 kg/m  Constitutional: Well formed, well developed. No acute distress. Thorax & Lungs: No accessory muscle use Musculoskeletal: neck.   Normal active range of motion: no; decreased sidebending and rotation to the right Normal passive range of motion: no; decreased sidebending and rotation to the right Tenderness to palpation: yes; over the lateral portion of the neck Deformity: no Ecchymosis: no Tests positive: none Tests negative: Spurling's Neurologic: Normal sensory function. No focal deficits noted. DTR's equal and symmetric in UE's. No clonus. Psychiatric: Normal mood. Age appropriate judgment and insight. Alert & oriented x 3.    Assessment:  Neck pain  Plan: Stretches/exercises,  heat, ice, Tylenol.  PT if no better in the next 3 to 4 weeks. F/u as originally scheduled regular PCP. The patient voiced understanding and agreement to the plan.   [place], DO 11/01/19  3:53 PM

## 2019-11-02 ENCOUNTER — Ambulatory Visit: Payer: Medicare Other | Admitting: Internal Medicine

## 2019-11-22 ENCOUNTER — Ambulatory Visit: Payer: Medicare Other | Attending: Internal Medicine

## 2019-11-22 ENCOUNTER — Other Ambulatory Visit (HOSPITAL_BASED_OUTPATIENT_CLINIC_OR_DEPARTMENT_OTHER): Payer: Self-pay | Admitting: Internal Medicine

## 2019-11-22 DIAGNOSIS — Z23 Encounter for immunization: Secondary | ICD-10-CM

## 2019-11-22 NOTE — Progress Notes (Signed)
   Covid-19 Vaccination Clinic  Name:  William Faulkner    MRN: [id number] DOB: 26-Apr-1934  11/22/2019  [name] was observed post Covid-19 immunization for 15 minutes without incident. He was provided with Vaccine Information Sheet and instruction to access the V-Safe system.   [name] was instructed to call 911 with any severe reactions post vaccine: Marland Kitchen Difficulty breathing  . Swelling of face and throat  . A fast heartbeat  . A bad rash all over body  . Dizziness and weakness

## 2019-11-23 ENCOUNTER — Other Ambulatory Visit: Payer: Self-pay | Admitting: *Deleted

## 2019-11-23 ENCOUNTER — Encounter: Payer: Self-pay | Admitting: *Deleted

## 2019-11-23 DIAGNOSIS — I712 Thoracic aortic aneurysm, without rupture, unspecified: Secondary | ICD-10-CM

## 2019-12-01 LAB — BASIC METABOLIC PANEL
BUN/Creatinine Ratio: 25 — ABNORMAL HIGH (ref 10–24)
BUN: 33 mg/dL — ABNORMAL HIGH (ref 8–27)
CO2: 25 mmol/L (ref 20–29)
Calcium: 9.8 mg/dL (ref 8.6–10.2)
Chloride: 104 mmol/L (ref 96–106)
Creatinine, Ser: 1.3 mg/dL — ABNORMAL HIGH (ref 0.76–1.27)
GFR calc Af Amer: 58 mL/min/{1.73_m2} — ABNORMAL LOW (ref >59)
GFR calc non Af Amer: 50 mL/min/{1.73_m2} — ABNORMAL LOW (ref >59)
Glucose: 76 mg/dL (ref 65–99)
Potassium: 5 mmol/L (ref 3.5–5.2)
Sodium: 138 mmol/L (ref 134–144)

## 2019-12-01 MED FILL — PFIZER-BIONTECH COVID-19 VA: 30 | 1 days supply | Qty: 0 | Fill #0

## 2019-12-03 ENCOUNTER — Encounter: Payer: Self-pay | Admitting: Internal Medicine

## 2019-12-07 ENCOUNTER — Other Ambulatory Visit: Payer: Self-pay | Admitting: Internal Medicine

## 2019-12-07 LAB — HM DIABETES EYE EXAM

## 2019-12-08 ENCOUNTER — Other Ambulatory Visit: Payer: Self-pay

## 2019-12-08 ENCOUNTER — Ambulatory Visit
Admission: RE | Admit: 2019-12-08 | Discharge: 2019-12-08 | Disposition: A | Discharge status: Home / Self Care | Payer: Medicare Other | Source: Ambulatory Visit | Attending: Cardiology | Admitting: Cardiology

## 2019-12-08 ENCOUNTER — Encounter: Payer: Self-pay | Admitting: Internal Medicine

## 2019-12-08 DIAGNOSIS — I712 Thoracic aortic aneurysm, without rupture, unspecified: Secondary | ICD-10-CM

## 2019-12-08 MED ORDER — IOPAMIDOL (ISOVUE-370) INJECTION 76%
75.0000 mL | Freq: Once | INTRAVENOUS | Status: AC | PRN
  Administered 2019-12-08: 75 mL via INTRAVENOUS

## 2019-12-09 ENCOUNTER — Telehealth: Payer: Self-pay | Admitting: Cardiology

## 2019-12-09 ENCOUNTER — Other Ambulatory Visit: Payer: Self-pay | Admitting: *Deleted

## 2019-12-09 DIAGNOSIS — K769 Liver disease, unspecified: Secondary | ICD-10-CM

## 2019-12-09 NOTE — Progress Notes (Signed)
mri

## 2019-12-09 NOTE — Telephone Encounter (Signed)
Left message for patient to call and discuss scheduling the MR Liver ordered by Dr. Stanford Breed

## 2019-12-21 ENCOUNTER — Encounter: Payer: Self-pay | Admitting: Cardiology

## 2019-12-21 ENCOUNTER — Telehealth (INDEPENDENT_AMBULATORY_CARE_PROVIDER_SITE_OTHER): Payer: Medicare Other | Admitting: Cardiology

## 2019-12-21 VITALS — BP 140/70 | HR 61 | Ht 74.0 in | Wt 218.0 lb

## 2019-12-21 DIAGNOSIS — I10 Essential (primary) hypertension: Secondary | ICD-10-CM | POA: Diagnosis not present

## 2019-12-21 DIAGNOSIS — I4510 Unspecified right bundle-branch block: Secondary | ICD-10-CM | POA: Diagnosis not present

## 2019-12-21 DIAGNOSIS — Z951 Presence of aortocoronary bypass graft: Secondary | ICD-10-CM | POA: Diagnosis not present

## 2019-12-21 DIAGNOSIS — I719 Aortic aneurysm of unspecified site, without rupture: Secondary | ICD-10-CM | POA: Diagnosis not present

## 2019-12-21 DIAGNOSIS — E785 Hyperlipidemia, unspecified: Secondary | ICD-10-CM

## 2019-12-21 DIAGNOSIS — N183 Chronic kidney disease, stage 3 unspecified: Secondary | ICD-10-CM

## 2019-12-21 DIAGNOSIS — E1140 Type 2 diabetes mellitus with diabetic neuropathy, unspecified: Secondary | ICD-10-CM

## 2019-12-21 NOTE — Progress Notes (Signed)
Virtual Visit via Telephone Note   This visit type was conducted due to national recommendations for restrictions regarding the COVID-19 Pandemic (e.g. social distancing) in an effort to limit this patient's exposure and mitigate transmission in our community.  Due to his co-morbid illnesses, this patient is at least at moderate risk for complications without adequate follow up.  This format is felt to be most appropriate for this patient at this time.  The patient did not have access to video technology/had technical difficulties with video requiring transitioning to audio format only (telephone).  All issues noted in this document were discussed and addressed.  No physical exam could be performed with this format.  Please refer to the patient's chart for his  consent to telehealth for [facility].    Date:  12/21/2019   ID:  William Faulkner, DOB 08/07/1934, MRN [id number] The patient was identified using 2 identifiers.  Patient Location: Home Provider Location: Home Office  PCP:  Colon Branch, MD  Cardiologist:  Dr Stanford Breed Electrophysiologist:  None    Chief Complaint:  Exertional fatigue and throat pain  History of Present Illness:    William Faulkner is a 85 y.o. male with a history of coronary disease, status post CABG x5 in 2011.  Other medical issues include an abdominal aortic enlargement, this was 4.4 cm by CT scan November 2021 which was unchanged from his previous scan of 2020.  He has normal sinus rhythm sinus bradycardia with a right bundle branch block and first-degree AV block.  He has hypertension, non-insulin-dependent diabetes, dyslipidemia, and chronic renal insufficiency.  He was contacted today for virtual visit.  The patient has had exertional fatigue.  After a long conversation with the patient and his wife I was able to sort out the details.  He has multiple complaints, he has leg pain which she attributes to neuropathy.  His main complaint is fatigue with  exertion.  On further interview he admits that he has had some neck pain associated with exertion as well as shortness of breath.  He has been treated for DJD.  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  He is not had chest pain but says he feels "tight all over" when he tries to do any kind of physical activity.  The patient does not have symptoms concerning for COVID-19 infection (fever, chills, cough, or new shortness of breath).    Past Medical History:  Diagnosis Date  . AORTIC ANEUR UNSPEC SITE WITHOUT MENTION RUPTURE   . AORTIC INSUFFICIENCY   . BPH (benign prostatic hyperplasia)   . CAD, ARTERY BYPASS GRAFT   . Cancer ([place])   . CEREBROVASCULAR DISEASE   . DIAB W/O COMP TYPE II/UNS NOT STATED UNCNTRL   . Hyperlipidemia   . HYPERTENSION, ESSENTIAL NOS   . NEOPLASM, MALIGNANT, BLADDER, HX OF   . Rotator cuff tear    Left shoulder,    Past Surgical History:  Procedure Laterality Date  . BACK SURGERY  2003   lower  . BLADDER TUMOR EXCISION     Surgery x6, Dr.Tannebaum  . BLEPHAROPLASTY  1980   bilaterally, Dr Gershon Crane  . COLONOSCOPY W/ POLYPECTOMY     Dr Ree Shay GI  . CORONARY ARTERY BYPASS GRAFT  2011   x 5  . CYSTOSCOPY  09/02/2012  . INGUINAL HERNIA REPAIR  10/24/2011   Procedure: HERNIA REPAIR INGUINAL ADULT;  Surgeon: Earnstine Regal, MD;  Location: [facility];  Service: General;  Laterality: Right;  Repair Right Inguinal Hernia with Mesh, Umbilical Hernia Repair with Mesh  . LUMBAR EPIDURAL INJECTION  2008  . ROTATOR CUFF REPAIR  2009   right; Dr Shellia Carwin  . SHOULDER ARTHROSCOPY WITH ROTATOR CUFF REPAIR AND SUBACROMIAL DECOMPRESSION  02/14/2014   Right, SAD/DCR, biceps tenodesis  . UMBILICAL HERNIA REPAIR  10/24/2011   Procedure: HERNIA REPAIR UMBILICAL ADULT;  Surgeon: Earnstine Regal, MD;  Location: [facility];  Service: General;  Laterality: N/A;     Current Meds  Medication Sig  . aspirin 81 MG tablet Take 81 mg by mouth daily.    . benazepril (LOTENSIN) 40 MG tablet Take 0.5  tablets (20 mg total) by mouth daily.  . cholecalciferol (VITAMIN D) 1000 UNITS tablet Take 1,000 Units by mouth daily.  Marland Kitchen gabapentin (NEURONTIN) 600 MG tablet Take 600 mg by mouth daily.  . hydrALAZINE (APRESOLINE) 25 MG tablet Take 25 mg by mouth in the morning and at bedtime.  . hydrochlorothiazide (HYDRODIURIL) 25 MG tablet TAKE 1 TABLET BY MOUTH  DAILY  . ketoconazole (NIZORAL) 2 % cream Apply 1 application topically daily.  . metFORMIN (GLUCOPHAGE) 850 MG tablet Take 1 tablet (850 mg total) by mouth 2 (two) times daily with a meal.  . propranolol ER (INDERAL LA) 80 MG 24 hr capsule Take 1 capsule (80 mg total) by mouth daily.  . rosuvastatin (CRESTOR) 20 MG tablet Take 1 tablet (20 mg total) by mouth daily.  . timolol (TIMOPTIC) 0.5 % ophthalmic solution Place 1 drop into both eyes every morning. As directed  . [DISCONTINUED] gabapentin (NEURONTIN) 600 MG tablet Take 1 tablet (600 mg total) by mouth 3 (three) times daily. (Patient taking differently: Take 600 mg by mouth daily. )  . [DISCONTINUED] hydrALAZINE (APRESOLINE) 25 MG tablet Take 1 tablet (25 mg total) by mouth 3 (three) times daily. (Patient taking differently: Take 25 mg by mouth in the morning and at bedtime. )     Allergies:   Atorvastatin, Celecoxib, Sulfonamide derivatives, and Zolpidem tartrate   Social History   Tobacco Use  . Smoking status: Former Smoker    Packs/day: 0.30    Years: 15.00    Pack years: 4.50    Types: Cigarettes    Quit date: 01/22/1984    Years since quitting: 35.9  . Smokeless tobacco: Never Used  Vaping Use  . Vaping Use: Never used  Substance Use Topics  . Alcohol use: No    Alcohol/week: 0.0 standard drinks    Comment: rare, wine   . Drug use: No     Family Hx: The patient's family history includes Diabetes in his mother; Other in his sister; Tremor in his father. There is no history of Stroke, Heart disease, or Cancer.  ROS:   Please see the history of present illness.    All  other systems reviewed and are negative.   Prior CV studies:   The following studies were reviewed today:  Echo 06/09/2019- IMPRESSIONS    1. Left ventricular ejection fraction, by estimation, is 50 to 55%. The  left ventricle has low normal function. The left ventricle has no regional  wall motion abnormalities. There is mild concentric left ventricular  hypertrophy. Left ventricular  diastolic parameters are consistent with Grade I diastolic dysfunction  (impaired relaxation).  2. Right ventricular systolic function is low normal. The right  ventricular size is normal.  3. The mitral valve is normal in structure. Trivial mitral valve  regurgitation. No evidence of mitral stenosis.  4.  The aortic valve is normal in structure. Aortic valve regurgitation is  moderate. No aortic stenosis is present. Aortic regurgitation PHT measures  440 msec.  5. Aortic dilatation noted. There is moderate dilatation of the ascending  aorta measuring 47 mm.  6. The inferior vena cava is normal in size with greater than 50%  respiratory variability, suggesting right atrial pressure of 3 mmHg.   Labs/Other Tests and Data Reviewed:    EKG:  An ECG dated 05/18/2019 was personally reviewed today and demonstrated:  NSR, SB HR 57, RBBB, 1st AVB, inferior Qs  Recent Labs: 07/19/2019: ALT 15; Hemoglobin 13.3; Platelets 221.0; TSH 2.87 12/01/2019: BUN 33; Creatinine, Ser 1.30; Potassium 5.0; Sodium 138   Recent Lipid Panel Lab Results  Component Value Date/Time   CHOL 115 07/19/2019 10:26 AM   CHOL 130 03/31/2018 09:29 AM   TRIG 175.0 (H) 07/19/2019 10:26 AM   HDL 35.50 (L) 07/19/2019 10:26 AM   HDL 38 (L) 03/31/2018 09:29 AM   CHOLHDL 3 07/19/2019 10:26 AM   LDLCALC 45 07/19/2019 10:26 AM   LDLCALC 52 03/31/2018 09:29 AM   LDLDIRECT 102.1 05/19/2007 01:48 PM    Wt Readings from Last 3 Encounters:  12/21/19 218 lb (98.9 kg)  11/01/19 218 lb (98.9 kg)  08/11/19 (!) 218 lb 6.4 oz (99.1 kg)       Risk Assessment/Calculations:      Objective:    Vital Signs:  BP 140/70   Pulse 61   Ht 6\' 2"  (1.88 m)   Wt 218 lb (98.9 kg)   BMI 27.99 kg/m    VITAL SIGNS:  reviewed  ASSESSMENT & PLAN:    Exertional fatigue, SOB, throat discomfort- Consider angina, also possibly bradycardia and chronotropic incompetence.  OV with dr Stanford Breed has been arranged for later this week.   CABG- CABG x 5 2011  AAA- 4.4cm- no change  Bradycardia- With RBBB and 1st AVB  HTN- Fair control- on multiple medications  NIDDM- With neuropathy  CRI- Stage 3A- GFR 50  Plan- OV with dr Stanford Breed as arranged.     Shared Decision Making/Informed Consent        COVID-19 Education: The signs and symptoms of COVID-19 were discussed with the patient and how to seek care for testing (follow up with PCP or arrange E-visit). The importance of social distancing was discussed today.  Time:   Today, I have spent 20 minutes with the patient with telehealth technology discussing the above problems.     Medication Adjustments/Labs and Tests Ordered: Current medicines are reviewed at length with the patient today.  Concerns regarding medicines are outlined above.   Tests Ordered: No orders of the defined types were placed in this encounter.   Medication Changes: No orders of the defined types were placed in this encounter.   Follow Up:  In Person Dr Stanford Breed  Signed, Kerin Ransom, PA-C  12/21/2019 3:04 PM    [place]

## 2019-12-21 NOTE — Patient Instructions (Addendum)
Medication Instructions:  Continue current medications  *If you need a refill on your cardiac medications before your next appointment, please call your pharmacy*   Lab Work: None Ordered   Testing/Procedures: None Ordered   Follow-Up: At [facility], you and your health needs are our priority.  As part of our continuing mission to provide you with exceptional heart care, we have created designated Provider Care Teams.  These Care Teams include your primary Cardiologist (physician) and Advanced Practice Providers (APPs -  Physician Assistants and Nurse Practitioners) who all work together to provide you with the care you need, when you need it.  We recommend signing up for the patient portal called "MyChart".  Sign up information is provided on this After Visit Summary.  MyChart is used to connect with patients for Virtual Visits (Telemedicine).  Patients are able to view lab/test results, encounter notes, upcoming appointments, etc.  Non-urgent messages can be sent to your provider as well.   To learn more about what you can do with MyChart, go to [email or web address].    Your next appointment:   Thursday December 2nd @ 9:20 am  The format for your next appointment:   In Person  Provider:   Kirk Ruths, MD

## 2019-12-22 ENCOUNTER — Other Ambulatory Visit: Payer: Self-pay | Admitting: Internal Medicine

## 2019-12-22 NOTE — Telephone Encounter (Signed)
Needs to take 3 times a day

## 2019-12-22 NOTE — Telephone Encounter (Signed)
Please advise how Pt should be taking gabapentin. Refill request is for 600mg  tid but on med list its now just listed as 600mg  once daily?

## 2019-12-23 ENCOUNTER — Other Ambulatory Visit: Payer: Self-pay | Admitting: Orthopedic Surgery

## 2019-12-23 ENCOUNTER — Ambulatory Visit (INDEPENDENT_AMBULATORY_CARE_PROVIDER_SITE_OTHER): Payer: Medicare Other | Admitting: Cardiology

## 2019-12-23 ENCOUNTER — Encounter: Payer: Self-pay | Admitting: Cardiology

## 2019-12-23 ENCOUNTER — Other Ambulatory Visit: Payer: Self-pay

## 2019-12-23 VITALS — BP 160/68 | HR 62 | Ht 74.0 in | Wt 221.4 lb

## 2019-12-23 DIAGNOSIS — I2510 Atherosclerotic heart disease of native coronary artery without angina pectoris: Secondary | ICD-10-CM

## 2019-12-23 DIAGNOSIS — I10 Essential (primary) hypertension: Secondary | ICD-10-CM | POA: Diagnosis not present

## 2019-12-23 DIAGNOSIS — M25511 Pain in right shoulder: Secondary | ICD-10-CM

## 2019-12-23 DIAGNOSIS — I359 Nonrheumatic aortic valve disorder, unspecified: Secondary | ICD-10-CM

## 2019-12-23 DIAGNOSIS — Z951 Presence of aortocoronary bypass graft: Secondary | ICD-10-CM | POA: Diagnosis not present

## 2019-12-23 DIAGNOSIS — E785 Hyperlipidemia, unspecified: Secondary | ICD-10-CM

## 2019-12-23 DIAGNOSIS — I712 Thoracic aortic aneurysm, without rupture, unspecified: Secondary | ICD-10-CM

## 2019-12-23 MED ORDER — HYDRALAZINE HCL 50 MG PO TABS: 50.0000 mg | ORAL_TABLET | Freq: Two times a day (BID) | ORAL | 3 refills | Status: DC

## 2019-12-23 NOTE — Progress Notes (Signed)
HPI: FU CAD, aortic insufficiency, and cerebrovascular disease. A cardiac catheterization in May of 2011 revealed severe three-vessel coronary artery disease and normal LV function. He subsequently underwent coronary artery bypass and graft in May of 2011 (left internal mammary artery to left anterior descending artery, saphenous vein graft to first diagonal, saphenous vein graft obtuse marginal 1, sequential saphenous vein graft to distal right coronary and posterior descending artery). Note an intraoperative transesophageal echocardiogram showed mild to moderate aortic insufficiency and a dilated aortic root. However this was felt not to require surgical intervention at that time. ABIs August 2017 normal.Carotid Dopplers November 2017 showed 1-39% bilateral stenosis.Abd ultrasound 12/17 showed >50 in proximal to mid aorta; no aneurysm. Echo 5/21 showed ejection fraction 50 to 55%, mild left ventricular hypertrophy, grade 1 diastolic dysfunction, moderate aortic insufficiency, moderately dilated ascending aorta at 47 mm.  CTA November 2021 showed 4.4 cm ascending thoracic aortic aneurysm, slow enlargement of cystic hepatic lesion and liver MRI recommended.  Since I last saw him,he denies dyspnea, orthopnea, PND, chest pain or syncope.  He has fallen twice recently injuring his right chest and shoulder.  Current Outpatient Medications  Medication Sig Dispense Refill  . aspirin 81 MG tablet Take 81 mg by mouth daily.      . benazepril (LOTENSIN) 40 MG tablet Take 0.5 tablets (20 mg total) by mouth daily. 45 tablet 1  . cholecalciferol (VITAMIN D) 1000 UNITS tablet Take 1,000 Units by mouth daily.    Marland Kitchen gabapentin (NEURONTIN) 600 MG tablet Take 1 tablet (600 mg total) by mouth 3 (three) times daily. 270 tablet 1  . hydrALAZINE (APRESOLINE) 25 MG tablet Take 25 mg by mouth in the morning and at bedtime.    . hydrochlorothiazide (HYDRODIURIL) 25 MG tablet TAKE 1 TABLET BY MOUTH  DAILY 30 tablet 6    . ketoconazole (NIZORAL) 2 % cream Apply 1 application topically daily. 30 g 0  . metFORMIN (GLUCOPHAGE) 850 MG tablet Take 1 tablet (850 mg total) by mouth 2 (two) times daily with a meal. 180 tablet 1  . propranolol ER (INDERAL LA) 80 MG 24 hr capsule Take 1 capsule (80 mg total) by mouth daily. 90 capsule 0  . rosuvastatin (CRESTOR) 20 MG tablet Take 1 tablet (20 mg total) by mouth daily. 90 tablet 3  . timolol (TIMOPTIC) 0.5 % ophthalmic solution Place 1 drop into both eyes every morning. As directed     No current facility-administered medications for this visit.     Past Medical History:  Diagnosis Date  . AORTIC ANEUR UNSPEC SITE WITHOUT MENTION RUPTURE   . AORTIC INSUFFICIENCY   . BPH (benign prostatic hyperplasia)   . CAD, ARTERY BYPASS GRAFT   . Cancer ([place])   . CEREBROVASCULAR DISEASE   . DIAB W/O COMP TYPE II/UNS NOT STATED UNCNTRL   . Hyperlipidemia   . HYPERTENSION, ESSENTIAL NOS   . NEOPLASM, MALIGNANT, BLADDER, HX OF   . Rotator cuff tear    Left shoulder,     Past Surgical History:  Procedure Laterality Date  . BACK SURGERY  2003   lower  . BLADDER TUMOR EXCISION     Surgery x6, Dr.Tannebaum  . BLEPHAROPLASTY  1980   bilaterally, Dr Gershon Crane  . COLONOSCOPY W/ POLYPECTOMY     Dr Ree Shay GI  . CORONARY ARTERY BYPASS GRAFT  2011   x 5  . CYSTOSCOPY  09/02/2012  . INGUINAL HERNIA REPAIR  10/24/2011   Procedure: HERNIA REPAIR  INGUINAL ADULT;  Surgeon: Earnstine Regal, MD;  Location: [facility];  Service: General;  Laterality: Right;  Repair Right Inguinal Hernia with Mesh, Umbilical Hernia Repair with Mesh  . LUMBAR EPIDURAL INJECTION  2008  . ROTATOR CUFF REPAIR  2009   right; Dr Shellia Carwin  . SHOULDER ARTHROSCOPY WITH ROTATOR CUFF REPAIR AND SUBACROMIAL DECOMPRESSION  02/14/2014   Right, SAD/DCR, biceps tenodesis  . UMBILICAL HERNIA REPAIR  10/24/2011   Procedure: HERNIA REPAIR UMBILICAL ADULT;  Surgeon: Earnstine Regal, MD;  Location: [facility];  Service: General;   Laterality: N/A;    Social History   Socioeconomic History  . Marital status: Married    Spouse name: Not on file  . Number of children: 3  . Years of education: Not on file  . Highest education level: Not on file  Occupational History  . Occupation: retired  Tobacco Use  . Smoking status: Former Smoker    Packs/day: 0.30    Years: 15.00    Pack years: 4.50    Types: Cigarettes    Quit date: 01/22/1984    Years since quitting: 35.9  . Smokeless tobacco: Never Used  Vaping Use  . Vaping Use: Never used  Substance and Sexual Activity  . Alcohol use: No    Alcohol/week: 0.0 standard drinks    Comment: rare, wine   . Drug use: No  . Sexual activity: Not on file  Other Topics Concern  . Not on file  Social History Narrative   8 G-children   Married x 56 years   Lives with wife in a one story home.  Has 3 children.     Retired from [employer].  Education: college.    Social Determinants of Health   Financial Resource Strain:   . Difficulty of Paying Living Expenses: Not on file  Food Insecurity:   . Worried About Charity fundraiser in the Last Year: Not on file  . Ran Out of Food in the Last Year: Not on file  Transportation Needs:   . Lack of Transportation (Medical): Not on file  . Lack of Transportation (Non-Medical): Not on file  Physical Activity:   . Days of Exercise per Week: Not on file  . Minutes of Exercise per Session: Not on file  Stress:   . Feeling of Stress : Not on file  Social Connections:   . Frequency of Communication with Friends and Family: Not on file  . Frequency of Social Gatherings with Friends and Family: Not on file  . Attends Religious Services: Not on file  . Active Member of Clubs or Organizations: Not on file  . Attends Archivist Meetings: Not on file  . Marital Status: Not on file  Intimate Partner Violence:   . Fear of Current or Ex-Partner: Not on file  . Emotionally Abused: Not on file  . Physically Abused: Not on file   . Sexually Abused: Not on file    Family History  Problem Relation Age of Onset  . Diabetes Mother   . Tremor Father   . Other Sister   . Stroke Neg Hx   . Heart disease Neg Hx   . Cancer Neg Hx     ROS: Peripheral neuropathy, pain in right chest, shoulder and neck from recent fall but no fevers or chills, productive cough, hemoptysis, dysphasia, odynophagia, melena, hematochezia, dysuria, hematuria, rash, seizure activity, orthopnea, PND, claudication. Remaining systems are negative.  Physical Exam: Well-developed well-nourished in no acute distress.  Skin is warm and dry.  HEENT is normal.  Neck is supple.  Chest is clear to auscultation with normal expansion.  Ecchymosis over right chest and shoulder. Cardiovascular exam is regular rate and rhythm.  2/6 systolic and diastolic murmur left sternal border. Abdominal exam nontender or distended. No masses palpated. Extremities show trace edema. neuro grossly intact  ECG-sinus rhythm, first-degree AV block, right bundle branch block.  Personally reviewed  A/P  1 coronary artery disease status post coronary artery bypass graft-patient is doing well with no chest pain.  Continue aspirin and statin.  2 thoracic aortic aneurysm-plan follow-up CTA November 2022.  3 moderate aortic insufficiency-plan follow-up echocardiogram May 2022.  4 hypertension-patient's blood pressure is elevated; increase hydralazine to 50 mg twice daily and follow.  5 hyperlipidemia-continue statin.  6 carotid artery disease-mild on most recent Dopplers.  Kirk Ruths, MD

## 2019-12-23 NOTE — Patient Instructions (Signed)
Medication Instructions:   INCREASE HYDRALAZINE TO 50 MG TWICE DAILY= 2 OF THE 25 MG TABLETS TWICE DAILY  *If you need a refill on your cardiac medications before your next appointment, please call your pharmacy*   Follow-Up: At [facility], you and your health needs are our priority.  As part of our continuing mission to provide you with exceptional heart care, we have created designated Provider Care Teams.  These Care Teams include your primary Cardiologist (physician) and Advanced Practice Providers (APPs -  Physician Assistants and Nurse Practitioners) who all work together to provide you with the care you need, when you need it.  We recommend signing up for the patient portal called "MyChart".  Sign up information is provided on this After Visit Summary.  MyChart is used to connect with patients for Virtual Visits (Telemedicine).  Patients are able to view lab/test results, encounter notes, upcoming appointments, etc.  Non-urgent messages can be sent to your provider as well.   To learn more about what you can do with MyChart, go to [email or web address].    Your next appointment:   6 month(s)  The format for your next appointment:   In Person  Provider:   Kirk Ruths, MD

## 2019-12-30 NOTE — Addendum Note (Signed)
Addended by: Vennie Homans on: 12/30/2019 02:07 PM   Modules accepted: Orders

## 2020-01-01 ENCOUNTER — Ambulatory Visit
Admission: RE | Admit: 2020-01-01 | Discharge: 2020-01-01 | Disposition: A | Discharge status: Home / Self Care | Payer: Medicare Other | Source: Ambulatory Visit | Attending: Orthopedic Surgery | Admitting: Orthopedic Surgery

## 2020-01-01 ENCOUNTER — Ambulatory Visit
Admission: RE | Admit: 2020-01-01 | Discharge: 2020-01-01 | Disposition: A | Discharge status: Home / Self Care | Payer: Medicare Other | Source: Ambulatory Visit | Attending: Cardiology | Admitting: Cardiology

## 2020-01-01 ENCOUNTER — Other Ambulatory Visit: Payer: Self-pay

## 2020-01-01 DIAGNOSIS — K769 Liver disease, unspecified: Secondary | ICD-10-CM

## 2020-01-01 DIAGNOSIS — M25511 Pain in right shoulder: Secondary | ICD-10-CM

## 2020-01-01 MED ORDER — GADOBENATE DIMEGLUMINE 529 MG/ML IV SOLN
20.0000 mL | Freq: Once | INTRAVENOUS | Status: AC | PRN
  Administered 2020-01-01: 16:00:00 20 mL via INTRAVENOUS

## 2020-01-04 ENCOUNTER — Encounter: Payer: Self-pay | Admitting: Internal Medicine

## 2020-01-04 ENCOUNTER — Other Ambulatory Visit: Payer: Self-pay

## 2020-01-04 ENCOUNTER — Ambulatory Visit: Payer: Medicare Other | Admitting: Internal Medicine

## 2020-01-04 VITALS — BP 128/60 | HR 57 | Temp 97.4°F | Ht 74.0 in | Wt 219.0 lb

## 2020-01-04 DIAGNOSIS — I2510 Atherosclerotic heart disease of native coronary artery without angina pectoris: Secondary | ICD-10-CM | POA: Diagnosis not present

## 2020-01-04 DIAGNOSIS — E1140 Type 2 diabetes mellitus with diabetic neuropathy, unspecified: Secondary | ICD-10-CM | POA: Diagnosis not present

## 2020-01-04 DIAGNOSIS — M542 Cervicalgia: Secondary | ICD-10-CM

## 2020-01-04 DIAGNOSIS — R269 Unspecified abnormalities of gait and mobility: Secondary | ICD-10-CM

## 2020-01-04 DIAGNOSIS — G629 Polyneuropathy, unspecified: Secondary | ICD-10-CM

## 2020-01-04 LAB — CBC WITH DIFFERENTIAL/PLATELET
Basophils Absolute: 0 10*3/uL (ref 0.0–0.1)
Basophils Relative: 0.5 % (ref 0.0–3.0)
Eosinophils Absolute: 0.3 10*3/uL (ref 0.0–0.7)
Eosinophils Relative: 3.6 % (ref 0.0–5.0)
HCT: 39.8 % (ref 39.0–52.0)
Hemoglobin: 12.8 g/dL — ABNORMAL LOW (ref 13.0–17.0)
Lymphocytes Relative: 24.4 % (ref 12.0–46.0)
Lymphs Abs: 1.9 10*3/uL (ref 0.7–4.0)
MCHC: 32.3 g/dL (ref 30.0–36.0)
MCV: 88.9 fl (ref 78.0–100.0)
Monocytes Absolute: 0.7 10*3/uL (ref 0.1–1.0)
Monocytes Relative: 9.2 % (ref 3.0–12.0)
Neutro Abs: 4.8 10*3/uL (ref 1.4–7.7)
Neutrophils Relative %: 62.3 % (ref 43.0–77.0)
Platelets: 272 10*3/uL (ref 150.0–400.0)
RBC: 4.47 Mil/uL (ref 4.22–5.81)
RDW: 14.4 % (ref 11.5–15.5)
WBC: 7.7 10*3/uL (ref 4.0–10.5)

## 2020-01-04 LAB — HEMOGLOBIN A1C: Hgb A1c MFr Bld: 6.1 % (ref 4.6–6.5)

## 2020-01-04 NOTE — Progress Notes (Signed)
Subjective:    Patient ID: William Faulkner, male    DOB: 1934/12/25, 85 y.o.   MRN: [id number]  DOS:  01/04/2020 Type of visit - description: Follow-up. His main concern is fatigue. When he walks he does feel tired. Denies chest pain, DOE.  Also had 3 falls lately, the last one was 12/14/2019. All of the falls seem to be mechanical, no LOC or presyncope. At the last visit he hurt his shoulder and the chronic neck pain got worse. Went to Ortho, x-rays were done and also MRI of the shoulder.  Neck pain again is chronic, mostly right-sided, no radiation.  Ambulatory BPs very good in the 130s, reports no low BPs.  Review of Systems Denies any LUTS. No depression per se but he feels frustrated because he is unable to do as much as he once did. No cough  Past Medical History:  Diagnosis Date  . AORTIC ANEUR UNSPEC SITE WITHOUT MENTION RUPTURE   . AORTIC INSUFFICIENCY   . BPH (benign prostatic hyperplasia)   . CAD, ARTERY BYPASS GRAFT   . Cancer ([place])   . CEREBROVASCULAR DISEASE   . DIAB W/O COMP TYPE II/UNS NOT STATED UNCNTRL   . Hyperlipidemia   . HYPERTENSION, ESSENTIAL NOS   . NEOPLASM, MALIGNANT, BLADDER, HX OF   . Rotator cuff tear    Left shoulder,     Past Surgical History:  Procedure Laterality Date  . BACK SURGERY  2003   lower  . BLADDER TUMOR EXCISION     Surgery x6, Dr.Tannebaum  . BLEPHAROPLASTY  1980   bilaterally, Dr Gershon Crane  . COLONOSCOPY W/ POLYPECTOMY     Dr Ree Shay [facility]  . CORONARY ARTERY BYPASS GRAFT  2011   x 5  . CYSTOSCOPY  09/02/2012  . INGUINAL HERNIA REPAIR  10/24/2011   Procedure: HERNIA REPAIR INGUINAL ADULT;  Surgeon: Earnstine Regal, MD;  Location: [facility];  Service: General;  Laterality: Right;  Repair Right Inguinal Hernia with Mesh, Umbilical Hernia Repair with Mesh  . LUMBAR EPIDURAL INJECTION  2008  . ROTATOR CUFF REPAIR  2009   right; Dr Shellia Carwin  . SHOULDER ARTHROSCOPY WITH ROTATOR CUFF REPAIR AND SUBACROMIAL DECOMPRESSION   02/14/2014   Right, SAD/DCR, biceps tenodesis  . UMBILICAL HERNIA REPAIR  10/24/2011   Procedure: HERNIA REPAIR UMBILICAL ADULT;  Surgeon: Earnstine Regal, MD;  Location: [facility];  Service: General;  Laterality: N/A;    Allergies as of 01/04/2020      Reactions   Atorvastatin Other (See Comments)   REACTION: increased cpk   Celecoxib Other (See Comments)   REACTION: palpitations= celebrex   Sulfonamide Derivatives Other (See Comments)   Unknown   Zolpidem Tartrate Other (See Comments)   REACTION: made patient feel weird= ambien      Medication List       Accurate as of January 04, 2020  5:45 PM. If you have any questions, ask your nurse or doctor.        aspirin 81 MG tablet Take 81 mg by mouth daily.   benazepril 40 MG tablet Commonly known as: LOTENSIN Take 0.5 tablets (20 mg total) by mouth daily.   cholecalciferol 1000 units tablet Commonly known as: VITAMIN D Take 1,000 Units by mouth daily.   gabapentin 600 MG tablet Commonly known as: NEURONTIN Take 1 tablet (600 mg total) by mouth 3 (three) times daily.   hydrALAZINE 50 MG tablet Commonly known as: APRESOLINE Take 1 tablet (50 mg total) by  mouth in the morning and at bedtime.   hydrochlorothiazide 25 MG tablet Commonly known as: HYDRODIURIL TAKE 1 TABLET BY MOUTH  DAILY   ketoconazole 2 % cream Commonly known as: NIZORAL Apply 1 application topically daily.   metFORMIN 850 MG tablet Commonly known as: GLUCOPHAGE Take 1 tablet (850 mg total) by mouth 2 (two) times daily with a meal.   propranolol ER 80 MG 24 hr capsule Commonly known as: INDERAL LA Take 1 capsule (80 mg total) by mouth daily.   rosuvastatin 20 MG tablet Commonly known as: CRESTOR Take 1 tablet (20 mg total) by mouth daily.   timolol 0.5 % ophthalmic solution Commonly known as: TIMOPTIC Place 1 drop into both eyes every morning. As directed          Objective:   Physical Exam BP 128/60 (BP Location: Right Arm, Patient  Position: Sitting, Cuff Size: Large)   Pulse (!) 57   Temp (!) 97.4 F (36.3 C) (Oral)   Ht 6\' 2"  (1.88 m)   Wt 219 lb (99.3 kg)   SpO2 97%   BMI 28.12 kg/m  General:   Well developed, NAD, BMI noted. HEENT:  Normocephalic . Face symmetric, atraumatic Lungs:  CTA B Normal respiratory effort, no intercostal retractions, no accessory muscle use. Heart: RRR, +  murmur.  Lower extremities: no pretibial edema bilaterally  Skin: Not pale. Not jaundice Neurologic:  alert & oriented X3.  Speech normal, gait unassisted, he is somewhat hesitant. DTRs: Symmetrically decreased Psych--  Cognition and judgment appear intact.  Cooperative with normal attention span and concentration.  Behavior appropriate. No anxious or depressed appearing.      Assessment     Assessment Diabetes HTN Hyperlipidemia Pain management: *Neuropathy (painful type):  ---01-930: Normal T55, folic acid, RPR. CK slightly elevated. NCS severe polyneuropathy ---Re-eval by neuro 06-2016: exam c/w neuropathy. Labs (-) ---intolerant  to lyrica ; intolerant to Elavil 04-2017 *MSK: h/o back surgery 2003 *Claudication:   Unremarkable ABIs 2017, back MRI 02-2016 (spinal stenosis not mention on the  report), saw Dr. Rita Ohara, symptoms not neurogenic.  Probably small vessel disease PVD. ABIs neg again 12-2016  CV: -CAD, CABG -Aortic insufficiency, dilated aortic root -Carotid artery disease 40-59% 10-2014, [place] 11-2015 : 1-39%, next per cards  -Ascending aortic aneurysm GU: BPH, bladder cancer Thyroid nodules: Small per [place]  03-2014 Cholelithiasis by CT 01-2014 Preglaucoma  PLAN: DM: On Januvia, check a A1c. HTN: Currently on Lotensin, hydralazine, HCTZ, Inderal.  Ambulatory BPs very good.  Last BMP satisfactory.  No change CAD: Saw cardiology 12/23/2019, next  CTA for thoracic aortic aneurysm due for  November 2022. Next echocardiogram for aortic insufficiency 05/2020. Hydralazine dose increased.   Check a  CBC Fatigue: With no cardiopulmonary symptoms.   Frequent falls, gait disorder: Likely multifactorial, mechanical, related to neuropathy.  Doubt related to low BP or low blood sugar.  History of vitamin D deficiency, recent levels satisfactory.  Probably multifactorial. His falls are  my main concern, strongly rec to start using a cane or a walker, he is quite reluctant, benefits and risk of fractures or disability d/w pt and wife My advice is to see physical therapy but he really likes to discuss the issue of frequent falls (and neck pain) with neurology thus will arrange a referral, DX gait disorder, neck pain and a history of neuropathy Chronic neck pain: Worse lately.  See above Immunizations reviewed RTC 3 months.   Time spent with the patient: 40 minutes due to  extensive chart review, long discussion about etiology of frequent falls and gait disorder, long discussion about further steps, I recommended physical therapy, he requested a neurology evaluation.  A fair amount of time was spent talking about fall prevention  This visit occurred during the SARS-CoV-2 public health emergency.  Safety protocols were in place, including screening questions prior to the visit, additional usage of staff PPE, and extensive cleaning of exam room while observing appropriate contact time as indicated for disinfecting solutions.

## 2020-01-04 NOTE — Assessment & Plan Note (Signed)
Immunizations reviewed: Td 2015 PNM 13: 2016.  PNM 23: 2017. COVID vaccines x3 Had a flu shot

## 2020-01-04 NOTE — Patient Instructions (Addendum)
Continue checking your blood pressure BP GOAL is between 110/65 and  135/85. If it is consistently higher or lower, let me know   GO TO THE LAB : Get the blood work     [place], [place] back for   a checkup in 3 months   Fall Prevention in the Home, Adult Falls can cause injuries and can affect people from all age groups. There are many simple things that you can do to make your home safe and to help prevent falls. Ask for help when making these changes, if needed. What actions can I take to prevent falls? General instructions  Use good lighting in all rooms. Replace any light bulbs that burn out.  Turn on lights if it is dark. Use night-lights.  Place frequently used items in easy-to-reach places. Lower the shelves around your home if necessary.  Set up furniture so that there are clear paths around it. Avoid moving your furniture around.  Remove throw rugs and other tripping hazards from the floor.  Avoid walking on wet floors.  Fix any uneven floor surfaces.  Add color or contrast paint or tape to grab bars and handrails in your home. Place contrasting color strips on the first and last steps of stairways.  When you use a stepladder, make sure that it is completely opened and that the sides are firmly locked. Have someone hold the ladder while you are using it. Do not climb a closed stepladder.  Be aware of any and all pets. What can I do in the bathroom?      Keep the floor dry. Immediately clean up any water that spills onto the floor.  Remove soap buildup in the tub or shower on a regular basis.  Use non-skid mats or decals on the floor of the tub or shower.  Attach bath mats securely with double-sided, non-slip rug tape.  If you need to sit down while you are in the shower, use a plastic, non-slip stool.  Install grab bars by the toilet and in the tub and shower. Do not use towel bars as grab bars. What can I do in  the bedroom?  Make sure that a bedside light is easy to reach.  Do not use oversized bedding that drapes onto the floor.  Have a firm chair that has side arms to use for getting dressed. What can I do in the kitchen?  Clean up any spills right away.  If you need to reach for something above you, use a sturdy step stool that has a grab bar.  Keep electrical cables out of the way.  Do not use floor polish or wax that makes floors slippery. If you must use wax, make sure that it is non-skid floor wax. What can I do in the stairways?  Do not leave any items on the stairs.  Make sure that you have a light switch at the top of the stairs and the bottom of the stairs. Have them installed if you do not have them.  Make sure that there are handrails on both sides of the stairs. Fix handrails that are broken or loose. Make sure that handrails are as long as the stairways.  Install non-slip stair treads on all stairs in your home.  Avoid having throw rugs at the top or bottom of stairways, or secure the rugs with carpet tape to prevent them from moving.  Choose a Loss adjuster, chartered that does not hide  the edge of steps on the stairway.  Check any carpeting to make sure that it is firmly attached to the stairs. Fix any carpet that is loose or worn. What can I do on the outside of my home?  Use bright outdoor lighting.  Regularly repair the edges of walkways and driveways and fix any cracks.  Remove high doorway thresholds.  Trim any shrubbery on the main path into your home.  Regularly check that handrails are securely fastened and in good repair. Both sides of any steps should have handrails.  Install guardrails along the edges of any raised decks or porches.  Clear walkways of debris and clutter, including tools and rocks.  Have leaves, snow, and ice cleared regularly.  Use sand or salt on walkways during winter months.  In the garage, clean up any spills right away, including  grease or oil spills. What other actions can I take?  Wear closed-toe shoes that fit well and support your feet. Wear shoes that have rubber soles or low heels.  Use mobility aids as needed, such as canes, walkers, scooters, and crutches.  Review your medicines with your health care provider. Some medicines can cause dizziness or changes in blood pressure, which increase your risk of falling. Talk with your health care provider about other ways that you can decrease your risk of falls. This may include working with a physical therapist or trainer to improve your strength, balance, and endurance. Where to find more information  Centers for Disease Control and Prevention, STEADI: WebmailGuide.co.za  [employer] on Aging: BrainJudge.co.uk Contact a health care provider if:  You are afraid of falling at home.  You feel weak, drowsy, or dizzy at home.  You fall at home. Summary  There are many simple things that you can do to make your home safe and to help prevent falls.  Ways to make your home safe include removing tripping hazards and installing grab bars in the bathroom.  Ask for help when making these changes in your home. This information is not intended to replace advice given to you by your health care provider. Make sure you discuss any questions you have with your health care provider. Document Revised: 12/20/2016 Document Reviewed: 08/22/2016 Elsevier Patient Education  2020 [employer].

## 2020-01-04 NOTE — Progress Notes (Signed)
Pre visit review using our clinic review tool, if applicable. No additional management support is needed unless otherwise documented below in the visit note. 

## 2020-01-04 NOTE — Assessment & Plan Note (Signed)
DM: On Januvia, check a A1c. HTN: Currently on Lotensin, hydralazine, HCTZ, Inderal.  Ambulatory BPs very good.  Last BMP satisfactory.  No change CAD: Saw cardiology 12/23/2019, next  CTA for thoracic aortic aneurysm due for  November 2022. Next echocardiogram for aortic insufficiency 05/2020. Hydralazine dose increased.   Check a CBC Fatigue: With no cardiopulmonary symptoms.   Frequent falls, gait disorder: Likely multifactorial, mechanical, related to neuropathy.  Doubt related to low BP or low blood sugar.  History of vitamin D deficiency, recent levels satisfactory.  Probably multifactorial. His falls are  my main concern, strongly rec to start using a cane or a walker, he is quite reluctant, benefits and risk of fractures or disability d/w pt and wife My advice is to see physical therapy but he really likes to discuss the issue of frequent falls (and neck pain) with neurology thus will arrange a referral, DX gait disorder, neck pain and a history of neuropathy Chronic neck pain: Worse lately.  See above Immunizations reviewed RTC 3 months.

## 2020-01-09 ENCOUNTER — Other Ambulatory Visit: Payer: Medicare Other

## 2020-01-11 ENCOUNTER — Telehealth: Payer: Self-pay | Admitting: Pharmacist

## 2020-01-11 NOTE — Progress Notes (Addendum)
Chronic Care Management Pharmacy Assistant   Name: William Faulkner  MRN: [id number] DOB: [birth year]  Reason for Encounter: HLD Disease State  Patient Questions:  1.  Have you seen any other providers since your last visit? Yes  2.  Any changes in your medicines or health? Yes    PCP : Colon Branch, MD   Their chronic conditions include: Diabetes, Hypertension, Hyperlipidemia/CVD, Chronic Pain/Neuropathy.  Office Visits: 01-04-2020 (PCP) Patient presented in the office as a follow up and c/o fatigue. Also has chronic neck pain that has been worsening lately. Lab work ordered and patient education provided on fall prevention in the home. No medication changes.  Consults: 12-23-2019 (Cardio) Patient presented in the office for f/u. Reported doing well with no chest pain. He is to continue with statin and aspirin. Hydralazine was increased from 25 mg  to 50 mg daily.  12-21-2019 (Cardio) Patient presented via telephone for f/u virtual visit c/o exertional fatigue. Patient also reports leg pain associated with neuropathy and neck pain, and shortness of breath. Reported feeling "tight all over" when he tries to do any kind of physical activity.There were no medication changes.  11-01-2019 (Fam Med) Patient presented in the office c/o neck pain. Patient was advised to perform Stretches/exercises, heat, ice, Tylenol.  PT if no better in the next 3 to 4 weeks. Ne medication changes   Allergies:   Allergies  Allergen Reactions   Atorvastatin Other (See Comments)    REACTION: increased cpk   Celecoxib Other (See Comments)    REACTION: palpitations= celebrex   Sulfonamide Derivatives Other (See Comments)    Unknown   Zolpidem Tartrate Other (See Comments)    REACTION: made patient feel weird= ambien    Medications: Outpatient Encounter Medications as of 01/11/2020  Medication Sig   aspirin 81 MG tablet Take 81 mg by mouth daily.   benazepril (LOTENSIN) 40 MG tablet Take 0.5  tablets (20 mg total) by mouth daily.   cholecalciferol (VITAMIN D) 1000 UNITS tablet Take 1,000 Units by mouth daily.   gabapentin (NEURONTIN) 600 MG tablet Take 1 tablet (600 mg total) by mouth 3 (three) times daily.   hydrALAZINE (APRESOLINE) 50 MG tablet Take 1 tablet (50 mg total) by mouth in the morning and at bedtime.   hydrochlorothiazide (HYDRODIURIL) 25 MG tablet TAKE 1 TABLET BY MOUTH  DAILY   ketoconazole (NIZORAL) 2 % cream Apply 1 application topically daily.   metFORMIN (GLUCOPHAGE) 850 MG tablet Take 1 tablet (850 mg total) by mouth 2 (two) times daily with a meal.   propranolol ER (INDERAL LA) 80 MG 24 hr capsule Take 1 capsule (80 mg total) by mouth daily.   rosuvastatin (CRESTOR) 20 MG tablet Take 1 tablet (20 mg total) by mouth daily.   timolol (TIMOPTIC) 0.5 % ophthalmic solution Place 1 drop into both eyes every morning. As directed   No facility-administered encounter medications on file as of 01/11/2020.    Current Diagnosis: Patient Active Problem List   Diagnosis Date Noted   RBBB 12/21/2019   CRI (chronic renal insufficiency), stage 3 (moderate) ([place]) 12/21/2019   Chronic back pain 11/21/2016   Neuropathy 11/21/2016   Benign essential tremor 07/11/2016   Follow-up -------------PCP NOTES 09/28/2014   Dizziness 08/10/2014   Cholelithiasis 03/24/2014   Annual physical exam 11/01/2013   Tremor 03/03/2013   Cervical spondylosis 12/17/2012   Inguinal hernia unilateral, non-recurrent, right 08/26/2011   Aortic aneurysm ([place]) 01/03/2010   Hx of CABG 06/20/2009  ANGINA DECUBITUS 06/02/2009   Cerebrovascular disease 06/02/2009   Diabetes and neuropathy 03/14/2008   NEOPLASM, MALIGNANT, BLADDER, HX OF 12/21/2007   BPH (benign prostatic hyperplasia) 01/12/2007   Hyperlipidemia 12/17/2006   Essential hypertension 12/17/2006   Aortic valve disorder 08/18/2006    Goals Addressed   None    01/11/2020 Name: William Faulkner MRN: [id number] DOB:  1934-05-21 William Faulkner is a 85 y.o. year old male who is a primary care patient of Wanda Plump, MD.  Comprehensive medication review performed; Spoke to patient regarding cholesterol  Lipid Panel    Component Value Date/Time   CHOL 115 07/19/2019 1026   CHOL 130 03/31/2018 0929   TRIG 175.0 (H) 07/19/2019 1026   HDL 35.50 (L) 07/19/2019 1026   HDL 38 (L) 03/31/2018 0929   LDLCALC 45 07/19/2019 1026   LDLCALC 52 03/31/2018 0929   LDLDIRECT 102.1 05/19/2007 1348    10-year ASCVD risk score: The ASCVD Risk score Denman George DC Jr., et al., 2013) failed to calculate for the following reasons:   The 2013 ASCVD risk score is only valid for ages 83 to 83  Current antihyperlipidemic regimen:  Aspirin 81 mg daily  Rosuvastatin 20 mg daily  Previous antihyperlipidemic medications tried: none noted  ASCVD risk enhancing conditions: age >83 and HTN   What recent interventions/DTPs have been made by any provider to improve Cholesterol control since last CPP Visit: none  Any recent hospitalizations or ED visits since last visit with CPP? No   What diet changes have been made to improve Cholesterol?  Following a heart healthy diet.  What exercise is being done to improve Cholesterol?  None  Adherence Review: Does the patient have >5 day gap between last estimated fill dates? No   Spoke with patient's wife, William Faulkner, who stated they would be going to Orthopedics to review MRI results. Inquired if patient was willing to schedule follow up appointment in January with the clinical pharmacist. William Faulkner stated he was not up to having another appointment at this time. He has been to see so many doctor's in the past few weeks. She assured me that either one of them would contact myself or the clinical pharmacist if there were any questions or concerns medication related.  Follow-Up:  Pharmacist Review   Corwin Levins, [facility] Clinical Pharmacist Assistant [phone]  9 minutes spent in review,  coordination, and documentation.   Reviewed by: Katrinka Blazing, PharmD, BCACP Clinical Pharmacist [facility] [phone]

## 2020-01-18 ENCOUNTER — Encounter: Payer: Self-pay | Admitting: Neurology

## 2020-02-06 ENCOUNTER — Other Ambulatory Visit: Payer: Self-pay | Admitting: Cardiology

## 2020-02-07 ENCOUNTER — Ambulatory Visit: Payer: Medicare Other | Admitting: Neurology

## 2020-02-07 ENCOUNTER — Other Ambulatory Visit: Payer: Self-pay | Admitting: Internal Medicine

## 2020-03-02 ENCOUNTER — Ambulatory Visit (INDEPENDENT_AMBULATORY_CARE_PROVIDER_SITE_OTHER): Payer: Medicare Other | Admitting: Neurology

## 2020-03-02 ENCOUNTER — Other Ambulatory Visit (INDEPENDENT_AMBULATORY_CARE_PROVIDER_SITE_OTHER): Payer: Medicare Other

## 2020-03-02 ENCOUNTER — Encounter: Payer: Self-pay | Admitting: Neurology

## 2020-03-02 ENCOUNTER — Other Ambulatory Visit: Payer: Self-pay

## 2020-03-02 VITALS — BP 180/58 | HR 58 | Resp 18 | Ht 74.0 in | Wt 221.0 lb

## 2020-03-02 DIAGNOSIS — G629 Polyneuropathy, unspecified: Secondary | ICD-10-CM

## 2020-03-02 DIAGNOSIS — R2681 Unsteadiness on feet: Secondary | ICD-10-CM

## 2020-03-02 DIAGNOSIS — R251 Tremor, unspecified: Secondary | ICD-10-CM | POA: Diagnosis not present

## 2020-03-02 LAB — VITAMIN B12: Vitamin B-12: 467 pg/mL (ref 211–911)

## 2020-03-02 NOTE — Patient Instructions (Addendum)
Start physical therapy for balance therapy  Start to use a cane, take extra care on uneven ground  Check your feet daily  Check vitamin B12  Return to clinic as needed  Your provider has requested that you have labwork completed today. Please go to [facility] ([id number]) on the second floor of this building before leaving the office today. You do not need to check in. If you are not called within 15 minutes please check with the front desk.

## 2020-03-02 NOTE — Progress Notes (Signed)
[facility] Clinic Note - Initial Visit   Date: 03/02/20  William Faulkner MRN: [id number] DOB: February 02, 1934   Dear Dr. Larose Kells:  Thank you for your kind referral of William Faulkner for consultation of neuropathy and gait instability. Although his history is well known to you, please allow [place] to reiterate it for the purpose of our medical record. The patient was accompanied to the clinic by wife who also provides collateral information.     History of Present Illness: William Faulkner is a 86 y.o. right-handed male with CAD, diabetes mellitus, hypertension, hyperlipidemia, AAA, and BPH presenting for evaluation of gait instability and neuropathy.  He was previously seen in 2018 for neuropathy.  Over the year, he has noticed mild progression of numbness up to the level of the knees and worsening imbalance.  He has fallen at least three times and needs assistance to stand back up. He walks unassisted. No issues with driving.  He does not have severe tingling, burning, or pain in the legs.  He takes gabapentin 600mg  twice daily. They have several questions about various advertisements about cures for neuropathy.  He also complains of mild tremor of the hands, which is most bothersome when writing or eating. No tremor at rest.  Recently, he gets tired very quickly with little activity.   TSH was normal in 2021.  Out-side paper records, electronic medical record, and images have been reviewed where available and summarized as:  Lab Results  Component Value Date   HGBA1C 6.1 01/04/2020   Lab Results  Component Value Date   ZJIRCVEL38 101 07/11/2016   Lab Results  Component Value Date   TSH 2.87 07/19/2019   Lab Results  Component Value Date   ESRSEDRATE 27 (H) 08/23/2015    Past Medical History:  Diagnosis Date  . AORTIC ANEUR UNSPEC SITE WITHOUT MENTION RUPTURE   . AORTIC INSUFFICIENCY   . BPH (benign prostatic hyperplasia)   . CAD, ARTERY BYPASS  GRAFT   . Cancer ([place])   . CEREBROVASCULAR DISEASE   . DIAB W/O COMP TYPE II/UNS NOT STATED UNCNTRL   . Hyperlipidemia   . HYPERTENSION, ESSENTIAL NOS   . NEOPLASM, MALIGNANT, BLADDER, HX OF   . Rotator cuff tear    Left shoulder,     Past Surgical History:  Procedure Laterality Date  . BACK SURGERY  2003   lower  . BLADDER TUMOR EXCISION     Surgery x6, Dr.Tannebaum  . BLEPHAROPLASTY  1980   bilaterally, Dr Gershon Crane  . COLONOSCOPY W/ POLYPECTOMY     Dr Ree Shay [facility]  . CORONARY ARTERY BYPASS GRAFT  2011   x 5  . CYSTOSCOPY  09/02/2012  . INGUINAL HERNIA REPAIR  10/24/2011   Procedure: HERNIA REPAIR INGUINAL ADULT;  Surgeon: Earnstine Regal, MD;  Location: [facility];  Service: General;  Laterality: Right;  Repair Right Inguinal Hernia with Mesh, Umbilical Hernia Repair with Mesh  . LUMBAR EPIDURAL INJECTION  2008  . ROTATOR CUFF REPAIR  2009   right; Dr Shellia Carwin  . SHOULDER ARTHROSCOPY WITH ROTATOR CUFF REPAIR AND SUBACROMIAL DECOMPRESSION  02/14/2014   Right, SAD/DCR, biceps tenodesis  . UMBILICAL HERNIA REPAIR  10/24/2011   Procedure: HERNIA REPAIR UMBILICAL ADULT;  Surgeon: Earnstine Regal, MD;  Location: [facility];  Service: General;  Laterality: N/A;     Medications:  Outpatient Encounter Medications as of 03/02/2020  Medication Sig  . aspirin 81 MG tablet Take 81 mg by mouth daily.  Marland Kitchen  benazepril (LOTENSIN) 40 MG tablet Take 0.5 tablets (20 mg total) by mouth daily.  . cholecalciferol (VITAMIN D) 1000 UNITS tablet Take 1,000 Units by mouth daily.  Marland Kitchen gabapentin (NEURONTIN) 600 MG tablet Take 1 tablet (600 mg total) by mouth 3 (three) times daily.  . hydrALAZINE (APRESOLINE) 50 MG tablet Take 1 tablet (50 mg total) by mouth in the morning and at bedtime.  . hydrochlorothiazide (HYDRODIURIL) 25 MG tablet TAKE 1 TABLET BY MOUTH  DAILY  . ketoconazole (NIZORAL) 2 % cream Apply 1 application topically daily.  . metFORMIN (GLUCOPHAGE) 850 MG tablet Take 1 tablet (850 mg total) by  mouth 2 (two) times daily with a meal.  . propranolol ER (INDERAL LA) 80 MG 24 hr capsule Take 1 capsule (80 mg total) by mouth daily.  . rosuvastatin (CRESTOR) 20 MG tablet TAKE 1 TABLET BY MOUTH  DAILY  . timolol (TIMOPTIC) 0.5 % ophthalmic solution Place 1 drop into both eyes every morning. As directed   No facility-administered encounter medications on file as of 03/02/2020.    Allergies:  Allergies  Allergen Reactions  . Atorvastatin Other (See Comments)    REACTION: increased cpk  . Celecoxib Other (See Comments)    REACTION: palpitations= celebrex  . Sulfonamide Derivatives Other (See Comments)    Unknown  . Zolpidem Tartrate Other (See Comments)    REACTION: made patient feel weird= ambien    Family History: Family History  Problem Relation Age of Onset  . Diabetes Mother   . Tremor Father   . Other Sister   . Stroke Neg Hx   . Heart disease Neg Hx   . Cancer Neg Hx     Social History: Social History   Tobacco Use  . Smoking status: Former Smoker    Packs/day: 0.30    Years: 15.00    Pack years: 4.50    Types: Cigarettes    Quit date: 01/22/1984    Years since quitting: 36.1  . Smokeless tobacco: Never Used  Vaping Use  . Vaping Use: Never used  Substance Use Topics  . Alcohol use: No    Alcohol/week: 0.0 standard drinks    Comment: rare, wine   . Drug use: No   Social History   Social History Narrative   8 G-children   Married x 56 years   Lives with wife in a one story home.  Has 3 children.     Retired from [employer].  Education: college.    Right handed   Drinks caffeine   One story home    Vital Signs:  BP (!) 180/58   Pulse (!) 58   Resp 18   Ht 6\' 2"  (1.88 m)   Wt 221 lb (100.2 kg)   SpO2 97%   BMI 28.37 kg/m    Neurological Exam: MENTAL STATUS including orientation to time, place, person, recent and remote memory, attention span and concentration, language, and fund of knowledge is normal.  Speech is not dysarthric.  CRANIAL  NERVES: II:  No visual field defects.    III-IV-VI: Pupils equal round and reactive to light.  Normal conjugate, extra-ocular eye movements in all directions of gaze.  No nystagmus.  No ptosis.   V:  Normal facial sensation.    VII:  Normal facial symmetry and movements.   VIII:  Normal hearing and vestibular function.   IX-X:  Normal palatal movement.   XI:  Normal shoulder shrug and head rotation.   XII:  Normal tongue strength  and range of motion, no deviation or fasciculation.  MOTOR:  Bilateral intention tremor (very mild). No atrophy, fasciculations or abnormal movements.  No pronator drift.   Upper Extremity:  Right  Left  Deltoid  5/5   5/5   Biceps  5/5   5/5   Triceps  5/5   5/5   Infraspinatus 5/5  5/5  Medial pectoralis 5/5  5/5  Wrist extensors  5/5   5/5   Wrist flexors  5/5   5/5   Finger extensors  5/5   5/5   Finger flexors  5/5   5/5   Dorsal interossei  5/5   5/5   Abductor pollicis  5/5   5/5   Tone (Ashworth scale)  0  0   Lower Extremity:  Right  Left  Hip flexors  5/5   5/5   Hip extensors  5/5   5/5   Adductor 5/5  5/5  Abductor 5/5  5/5  Knee flexors  5/5   5/5   Knee extensors  5/5   5/5   Dorsiflexors  5/5   5/5   Plantarflexors  5/5   5/5   Toe extensors  5/5   5/5   Toe flexors  5/5   5/5   Tone (Ashworth scale)  0  0   MSRs:  Right        Left                  brachioradialis 1+  1+  biceps 1+  1+  triceps 1+  1+  patellar 0  0  ankle jerk 0  0  Hoffman no  no  plantar response down  down   SENSORY:  Vibration is absent distal to ankles and there is a gradient pattern of temperature and pin prick loss distal to lower legs bilaterally.  Be[place]bour is is present.  COORDINATION/GAIT: Normal finger-to- nose-finger.  Intact rapid alternating movements bilaterally.  Gait slightly wide-based, stable unassisted.  Unable to perform tandem gait. Stressed gait intact.   IMPRESSION: 1. Idiopathic peripheral neuropathy manifesting with  predominately numbness and sensory ataxia.  I had extensive discussion with the patient regarding the pathogenesis, etiology, management, and natural course of neuropathy. Neuropathy tends to be slowly progressive, especially if a treatable etiology is not identified.  Management is symptomatic.   Painful paresthesias seem to be well-controlled on gabapentin 600mg  BID.  For imbalance, start out-patient physical therapy He is reluctant to use a cane, but I urged him to consider this, especially when on uneven ground Patient educated on daily foot inspection, fall prevention, and safety precautions around the home. Discouraged him from seeking alternative therapies that claim to cure neuropathy  2. Mild essential tremor, mild.   Continue to monitor  3. Generalized fatigue.  Check vitamin B12  Return to clinic as needed  Total time spent reviewing records, interview, history/exam, documentation, counseling, and coordination of care on day of encounter:  60 min     Thank you for allowing me to participate in patient's care.  If I can answer any additional questions, I would be pleased to do so.    Sincerely,    Kylon Philbrook K. Posey Pronto, DO

## 2020-03-03 ENCOUNTER — Telehealth: Payer: Self-pay

## 2020-03-03 NOTE — Telephone Encounter (Signed)
Called patient and per DPR spoke to patients wife and informed her of normal lab results. Patients wife verbalized understanding and had no questions or concerns.

## 2020-03-03 NOTE — Telephone Encounter (Signed)
-----   Message from Alda Berthold, DO sent at 03/03/2020  8:41 AM EST ----- Please notify patient lab are within normal limits.  Thank you.

## 2020-03-04 ENCOUNTER — Other Ambulatory Visit: Payer: Self-pay | Admitting: Internal Medicine

## 2020-03-15 ENCOUNTER — Ambulatory Visit: Payer: Medicare Other

## 2020-03-16 ENCOUNTER — Encounter: Payer: Self-pay | Admitting: Internal Medicine

## 2020-03-16 ENCOUNTER — Other Ambulatory Visit: Payer: Self-pay

## 2020-03-16 ENCOUNTER — Ambulatory Visit: Payer: Medicare Other | Admitting: Internal Medicine

## 2020-03-16 VITALS — BP 156/50 | HR 61 | Temp 97.6°F | Resp 18 | Ht 74.0 in | Wt 223.4 lb

## 2020-03-16 DIAGNOSIS — G629 Polyneuropathy, unspecified: Secondary | ICD-10-CM

## 2020-03-16 DIAGNOSIS — N390 Urinary tract infection, site not specified: Secondary | ICD-10-CM

## 2020-03-16 DIAGNOSIS — N400 Enlarged prostate without lower urinary tract symptoms: Secondary | ICD-10-CM | POA: Diagnosis not present

## 2020-03-16 DIAGNOSIS — Z8551 Personal history of malignant neoplasm of bladder: Secondary | ICD-10-CM

## 2020-03-16 DIAGNOSIS — R269 Unspecified abnormalities of gait and mobility: Secondary | ICD-10-CM

## 2020-03-16 LAB — URINALYSIS, ROUTINE W REFLEX MICROSCOPIC
Bilirubin Urine: NEGATIVE
Hgb urine dipstick: NEGATIVE
Ketones, ur: NEGATIVE
Nitrite: NEGATIVE
Specific Gravity, Urine: 1.025 (ref 1.000–1.030)
Urine Glucose: NEGATIVE
Urobilinogen, UA: 0.2 (ref 0.0–1.0)
pH: 6 (ref 5.0–8.0)

## 2020-03-16 LAB — POC URINALSYSI DIPSTICK (AUTOMATED)
Bilirubin, UA: NEGATIVE
Blood, UA: NEGATIVE
Glucose, UA: NEGATIVE
Ketones, UA: NEGATIVE
Nitrite, UA: NEGATIVE
Protein, UA: POSITIVE — AB
Spec Grav, UA: 1.025 (ref 1.010–1.025)
Urobilinogen, UA: 0.2 E.U./dL
pH, UA: 6 (ref 5.0–8.0)

## 2020-03-16 MED ORDER — AMOXICILLIN-POT CLAVULANATE 875-125 MG PO TABS: 1.0000 | ORAL_TABLET | Freq: Two times a day (BID) | ORAL | 0 refills | Status: DC

## 2020-03-16 NOTE — Progress Notes (Signed)
Pre visit review using our clinic review tool, if applicable. No additional management support is needed unless otherwise documented below in the visit note. 

## 2020-03-16 NOTE — Patient Instructions (Addendum)
Drink plenty of fluids  Start Augmentin twice daily  Call or go to the ER if: Severe symptoms, fever chills, unable to urinate.  Schedule a follow-up with me in few weeks

## 2020-03-16 NOTE — Progress Notes (Signed)
Subjective:    Patient ID: William Faulkner, male    DOB: March 28, 1934, 86 y.o.   MRN: [id number]  DOS:  03/16/2020 Type of visit - description: Acute visit, here with his wife 2-day history of dysuria and urinary urgency. Yesterday saw some drops of blood in the urine approximately 3 times.  No gross hematuria today.   Review of Systems No fever chills No flank or abdominal pain No nausea or vomiting Denies any difficulty urinating, some frequency.  Past Medical History:  Diagnosis Date  . AORTIC ANEUR UNSPEC SITE WITHOUT MENTION RUPTURE   . AORTIC INSUFFICIENCY   . BPH (benign prostatic hyperplasia)   . CAD, ARTERY BYPASS GRAFT   . Cancer ([place])   . CEREBROVASCULAR DISEASE   . DIAB W/O COMP TYPE II/UNS NOT STATED UNCNTRL   . Hyperlipidemia   . HYPERTENSION, ESSENTIAL NOS   . NEOPLASM, MALIGNANT, BLADDER, HX OF   . Rotator cuff tear    Left shoulder,     Past Surgical History:  Procedure Laterality Date  . BACK SURGERY  2003   lower  . BLADDER TUMOR EXCISION     Surgery x6, Dr.Tannebaum  . BLEPHAROPLASTY  1980   bilaterally, Dr Gershon Crane  . COLONOSCOPY W/ POLYPECTOMY     Dr Ree Shay [facility]  . CORONARY ARTERY BYPASS GRAFT  2011   x 5  . CYSTOSCOPY  09/02/2012  . INGUINAL HERNIA REPAIR  10/24/2011   Procedure: HERNIA REPAIR INGUINAL ADULT;  Surgeon: Earnstine Regal, MD;  Location: [facility];  Service: General;  Laterality: Right;  Repair Right Inguinal Hernia with Mesh, Umbilical Hernia Repair with Mesh  . LUMBAR EPIDURAL INJECTION  2008  . ROTATOR CUFF REPAIR  2009   right; Dr Shellia Carwin  . SHOULDER ARTHROSCOPY WITH ROTATOR CUFF REPAIR AND SUBACROMIAL DECOMPRESSION  02/14/2014   Right, SAD/DCR, biceps tenodesis  . UMBILICAL HERNIA REPAIR  10/24/2011   Procedure: HERNIA REPAIR UMBILICAL ADULT;  Surgeon: Earnstine Regal, MD;  Location: [facility];  Service: General;  Laterality: N/A;    Allergies as of 03/16/2020      Reactions   Atorvastatin Other (See Comments)   REACTION:  increased cpk   Celecoxib Other (See Comments)   REACTION: palpitations= celebrex   Sulfonamide Derivatives Other (See Comments)   Unknown   Zolpidem Tartrate Other (See Comments)   REACTION: made patient feel weird= ambien      Medication List       Accurate as of March 16, 2020 11:19 AM. If you have any questions, ask your nurse or doctor.        aspirin 81 MG tablet Take 81 mg by mouth daily.   benazepril 40 MG tablet Commonly known as: LOTENSIN Take 0.5 tablets (20 mg total) by mouth daily.   cholecalciferol 1000 units tablet Commonly known as: VITAMIN D Take 1,000 Units by mouth daily.   gabapentin 600 MG tablet Commonly known as: NEURONTIN Take 1 tablet (600 mg total) by mouth 3 (three) times daily.   hydrALAZINE 50 MG tablet Commonly known as: APRESOLINE Take 1 tablet (50 mg total) by mouth in the morning and at bedtime.   hydrochlorothiazide 25 MG tablet Commonly known as: HYDRODIURIL TAKE 1 TABLET BY MOUTH  DAILY   ketoconazole 2 % cream Commonly known as: NIZORAL Apply 1 application topically daily.   metFORMIN 850 MG tablet Commonly known as: GLUCOPHAGE Take 1 tablet (850 mg total) by mouth 2 (two) times daily with a meal.  propranolol ER 80 MG 24 hr capsule Commonly known as: INDERAL LA TAKE 1 CAPSULE BY MOUTH  DAILY   rosuvastatin 20 MG tablet Commonly known as: CRESTOR TAKE 1 TABLET BY MOUTH  DAILY   timolol 0.5 % ophthalmic solution Commonly known as: TIMOPTIC Place 1 drop into both eyes every morning. As directed          Objective:   Physical Exam BP (!) 156/50 (BP Location: Left Arm, Patient Position: Sitting, Cuff Size: Normal)   Pulse 61   Temp 97.6 F (36.4 C) (Oral)   Resp 18   Ht 6\' 2"  (1.88 m)   Wt 223 lb 6 oz (101.3 kg)   SpO2 98%   BMI 28.68 kg/m  General:   Well developed, NAD, BMI noted.  HEENT:  Normocephalic . Face symmetric, atraumatic  Abdomen:  Not distended, soft, non-tender. No rebound or rigidity.   No CVA tenderness DRE: Brown stools, prostate is not enlarged or tender Skin: Not pale. Not jaundice Lower extremities: no pretibial edema bilaterally  Neurologic:  alert & oriented X3.  Speech normal, gait appropriate for age and unassisted Psych--  Cognition and judgment appear intact.  Cooperative with normal attention span and concentration.  Behavior appropriate. No anxious or depressed appearing.     Assessment     Assessment Diabetes HTN Hyperlipidemia Pain management: *Neuropathy (painful type):  ---08-1446: Normal J85, folic acid, RPR. CK slightly elevated. NCS severe polyneuropathy ---Re-eval by neuro 06-2016: exam c/w neuropathy. Labs (-) ---intolerant  to lyrica ; intolerant to Elavil 04-2017 *MSK: h/o back surgery 2003 *Claudication:   Unremarkable ABIs 2017, back MRI 02-2016 (spinal stenosis not mention on the  report), saw Dr. Rita Ohara, symptoms not neurogenic.  Probably small vessel disease PVD. ABIs neg again 12-2016  CV: -CAD, CABG -Aortic insufficiency, dilated aortic root -Carotid artery disease 40-59% 10-2014, [place] 11-2015 : 1-39%, next per cards  -Ascending aortic aneurysm GU: BPH, bladder cancer Thyroid nodules: Small per [place]  03-2014 Cholelithiasis by CT 01-2014 Preglaucoma  PLAN: UTI: LUTS symptoms for a couple of days, DRE not consistent with prostatitis, he does have a history of BPH and bladder cancer, released by urology about 8 years and asymptomatic until now. U dip: + Leukocyte Allergic to sulfa, history of an aortic aneurysm (avoid Cipro) Plan: UA, urine culture, start Augmentin. Urology referral Neuropathy:   Saw neurology 03/02/2020: Idiopathic peripheral neuropathy, recommended to continue pending PT for imbalance.  He was noted to be reluctant to use a cane but he was educated about it.  B12 levels normal. Frequent fall, gait disorder: See above RTC few weeks  This visit occurred during the SARS-CoV-2 public health emergency.  Safety  protocols were in place, including screening questions prior to the visit, additional usage of staff PPE, and extensive cleaning of exam room while observing appropriate contact time as indicated for disinfecting solutions.

## 2020-03-18 LAB — URINE CULTURE
MICRO NUMBER:: 11574838
SPECIMEN QUALITY:: ADEQUATE

## 2020-03-19 NOTE — Assessment & Plan Note (Signed)
UTI: LUTS symptoms for a couple of days, DRE not consistent with prostatitis, he does have a history of BPH and bladder cancer, released by urology about 8 years and asymptomatic until now. U dip: + Leukocyte Allergic to sulfa, history of an aortic aneurysm (avoid Cipro) Plan: UA, urine culture, start Augmentin. Urology referral Neuropathy:   Saw neurology 03/02/2020: Idiopathic peripheral neuropathy, recommended to continue pending PT for imbalance.  He was noted to be reluctant to use a cane but he was educated about it.  B12 levels normal. Frequent fall, gait disorder: See above RTC few weeks

## 2020-03-20 ENCOUNTER — Encounter: Payer: Self-pay | Admitting: Internal Medicine

## 2020-04-18 ENCOUNTER — Encounter: Payer: Self-pay | Admitting: Internal Medicine

## 2020-04-23 ENCOUNTER — Other Ambulatory Visit: Payer: Self-pay | Admitting: Internal Medicine

## 2020-05-15 ENCOUNTER — Other Ambulatory Visit: Payer: Self-pay | Admitting: Internal Medicine

## 2020-05-15 ENCOUNTER — Encounter: Payer: Self-pay | Admitting: Cardiology

## 2020-06-14 ENCOUNTER — Other Ambulatory Visit: Payer: Self-pay | Admitting: Cardiology

## 2020-06-19 ENCOUNTER — Other Ambulatory Visit: Payer: Self-pay | Admitting: Cardiology

## 2020-06-19 DIAGNOSIS — I10 Essential (primary) hypertension: Secondary | ICD-10-CM

## 2020-06-19 NOTE — Progress Notes (Signed)
HPI: FU CAD, aortic insufficiency, and cerebrovascular disease. A cardiac catheterization in May of 2011 revealed severe three-vessel coronary artery disease and normal LV function. He subsequently underwent coronary artery bypass and graft in May of 2011 (left internal mammary artery to left anterior descending artery, saphenous vein graft to first diagonal, saphenous vein graft obtuse marginal 1, sequential saphenous vein graft to distal right coronary and posterior descending artery). Note an intraoperative transesophageal echocardiogram showed mild to moderate aortic insufficiency and a dilated aortic root. However this was felt not to require surgical intervention at that time. ABIs August 2017 normal. Carotid Dopplers November 2017 showed 1-39% bilateral stenosis. Abd ultrasound 12/17 showed >50 in proximal to mid aorta; no aneurysm. Echo 5/21 showed ejection fraction 50 to 55%, mild left ventricular hypertrophy, grade 1 diastolic dysfunction, moderate aortic insufficiency, moderately dilated ascending aorta at 47 mm.  CTA November 2021 showed 4.4 cm ascending thoracic aortic aneurysm, slow enlargement of cystic hepatic lesion and liver MRI recommended.  Since I last saw him, he notes dyspnea on exertion but no orthopnea, PND, chest pain, palpitations or syncope.  He has chronic mild ankle edema.  Current Outpatient Medications  Medication Sig Dispense Refill   amoxicillin-clavulanate (AUGMENTIN) 875-125 MG tablet Take 1 tablet by mouth 2 (two) times daily. 20 tablet 0   aspirin 81 MG tablet Take 81 mg by mouth daily.     benazepril (LOTENSIN) 40 MG tablet Take 0.5 tablets (20 mg total) by mouth daily. 45 tablet 0   cholecalciferol (VITAMIN D) 1000 UNITS tablet Take 1,000 Units by mouth daily.     COVID-19 mRNA vaccine, Pfizer, 30 MCG/0.3ML injection INJECT AS DIRECTED .3 mL 0   gabapentin (NEURONTIN) 600 MG tablet Take 1 tablet (600 mg total) by mouth 3 (three) times daily. 270 tablet 1    hydrALAZINE (APRESOLINE) 50 MG tablet TAKE 1 TABLET (50 MG TOTAL) BY MOUTH IN THE MORNING AND AT BEDTIME. 180 tablet 1   hydrochlorothiazide (HYDRODIURIL) 25 MG tablet TAKE 1 TABLET BY MOUTH  DAILY 30 tablet 11   ketoconazole (NIZORAL) 2 % cream Apply 1 application topically daily. 30 g 0   metFORMIN (GLUCOPHAGE) 850 MG tablet Take 1 tablet (850 mg total) by mouth 2 (two) times daily with a meal. 60 tablet 0   propranolol ER (INDERAL LA) 80 MG 24 hr capsule TAKE 1 CAPSULE BY MOUTH  DAILY 90 capsule 3   rosuvastatin (CRESTOR) 20 MG tablet TAKE 1 TABLET BY MOUTH  DAILY 90 tablet 3   timolol (TIMOPTIC) 0.5 % ophthalmic solution Place 1 drop into both eyes every morning. As directed     No current facility-administered medications for this visit.     Past Medical History:  Diagnosis Date   AORTIC ANEUR UNSPEC SITE WITHOUT MENTION RUPTURE    AORTIC INSUFFICIENCY    BPH (benign prostatic hyperplasia)    CAD, ARTERY BYPASS GRAFT    Cancer ([facility])    CEREBROVASCULAR DISEASE    DIAB W/O COMP TYPE II/UNS NOT STATED UNCNTRL    Hyperlipidemia    HYPERTENSION, ESSENTIAL NOS    NEOPLASM, MALIGNANT, BLADDER, HX OF    Rotator cuff tear    Left shoulder,     Past Surgical History:  Procedure Laterality Date   BACK SURGERY  2003   lower   BLADDER TUMOR EXCISION     Surgery x6, Dr.Tannebaum   BLEPHAROPLASTY  1980   bilaterally, Dr Gershon Crane   COLONOSCOPY W/ POLYPECTOMY  Dr Ree Shay [facility]   CORONARY ARTERY BYPASS GRAFT  2011   x 5   CYSTOSCOPY  09/02/2012   INGUINAL HERNIA REPAIR  10/24/2011   Procedure: HERNIA REPAIR INGUINAL ADULT;  Surgeon: Earnstine Regal, MD;  Location: [facility];  Service: General;  Laterality: Right;  Repair Right Inguinal Hernia with Mesh, Umbilical Hernia Repair with Mesh   LUMBAR EPIDURAL INJECTION  2008   ROTATOR CUFF REPAIR  2009   right; Dr Shellia Carwin   SHOULDER ARTHROSCOPY WITH ROTATOR CUFF REPAIR AND SUBACROMIAL DECOMPRESSION  02/14/2014   Right, SAD/DCR, biceps  tenodesis   UMBILICAL HERNIA REPAIR  10/24/2011   Procedure: HERNIA REPAIR UMBILICAL ADULT;  Surgeon: Earnstine Regal, MD;  Location: [facility];  Service: General;  Laterality: N/A;    Social History   Socioeconomic History   Marital status: Married    Spouse name: Not on file   Number of children: 3   Years of education: Not on file   Highest education level: Not on file  Occupational History   Occupation: retired  Tobacco Use   Smoking status: Former    Packs/day: 0.30    Years: 15.00    Pack years: 4.50    Types: Cigarettes    Quit date: 01/22/1984    Years since quitting: 36.4   Smokeless tobacco: Never  Vaping Use   Vaping Use: Never used  Substance and Sexual Activity   Alcohol use: No    Alcohol/week: 0.0 standard drinks    Comment: rare, wine    Drug use: No   Sexual activity: Not on file  Other Topics Concern   Not on file  Social History Narrative   8 G-children   Married x 56 years   Lives with wife in a one story home.  Has 3 children.     Retired from [employer].  Education: college.    Right handed   Drinks caffeine   One story home   Social Determinants of Health   Financial Resource Strain: Not on file  Food Insecurity: Not on file  Transportation Needs: Not on file  Physical Activity: Not on file  Stress: Not on file  Social Connections: Not on file  Intimate Partner Violence: Not on file    Family History  Problem Relation Age of Onset   Diabetes Mother    Tremor Father    Other Sister    Stroke Neg Hx    Heart disease Neg Hx    Cancer Neg Hx     ROS: Arthralgias but no fevers or chills, productive cough, hemoptysis, dysphasia, odynophagia, melena, hematochezia, dysuria, hematuria, rash, seizure activity, orthopnea, PND, claudication. Remaining systems are negative.  Physical Exam: Well-developed well-nourished in no acute distress.  Skin is warm and dry.  HEENT is normal.  Neck is supple.  Chest is clear to auscultation with normal  expansion.  Cardiovascular exam is regular rate and rhythm.  2/6 systolic and diastolic murmur. Abdominal exam nontender or distended. No masses palpated. Extremities show trace ankle edema. neuro grossly intact  A/P  1 coronary artery disease status post coronary artery bypass and graft-patient doing well with no chest pain.  Continue medical therapy with aspirin and statin.  3 thoracic aortic aneurysm-he will need follow-up CTA November 2022.  3 aortic insufficiency-we will arrange follow-up echocardiogram.  Patient does note dyspnea on exertion.  Given his age not clear that he would consider aortic valve replacement in the future.  4 hypertension-blood pressure elevated.  Increase hydralazine  to 75 mg twice daily and follow.  Check potassium and renal function.  5 hyperlipidemia-continue statin.  Check lipids and liver.  6 carotid artery disease-patient noted to have mild disease on most recent Dopplers.  Continue medical therapy.  Kirk Ruths, MD

## 2020-06-26 ENCOUNTER — Ambulatory Visit: Payer: Medicare Other | Admitting: Cardiology

## 2020-07-03 ENCOUNTER — Ambulatory Visit (INDEPENDENT_AMBULATORY_CARE_PROVIDER_SITE_OTHER): Payer: Medicare Other | Admitting: Cardiology

## 2020-07-03 ENCOUNTER — Other Ambulatory Visit: Payer: Self-pay | Admitting: Internal Medicine

## 2020-07-03 ENCOUNTER — Encounter: Payer: Self-pay | Admitting: Cardiology

## 2020-07-03 ENCOUNTER — Other Ambulatory Visit: Payer: Self-pay

## 2020-07-03 VITALS — BP 160/62 | HR 67 | Ht 74.0 in | Wt 217.8 lb

## 2020-07-03 DIAGNOSIS — I10 Essential (primary) hypertension: Secondary | ICD-10-CM | POA: Diagnosis not present

## 2020-07-03 DIAGNOSIS — I359 Nonrheumatic aortic valve disorder, unspecified: Secondary | ICD-10-CM

## 2020-07-03 DIAGNOSIS — Z79899 Other long term (current) drug therapy: Secondary | ICD-10-CM | POA: Diagnosis not present

## 2020-07-03 DIAGNOSIS — E785 Hyperlipidemia, unspecified: Secondary | ICD-10-CM | POA: Diagnosis not present

## 2020-07-03 LAB — COMPREHENSIVE METABOLIC PANEL
ALT: 15 IU/L (ref 0–44)
AST: 19 IU/L (ref 0–40)
Albumin/Globulin Ratio: 1.8 (ref 1.2–2.2)
Albumin: 4.4 g/dL (ref 3.6–4.6)
Alkaline Phosphatase: 55 IU/L (ref 44–121)
BUN/Creatinine Ratio: 20 (ref 10–24)
BUN: 28 mg/dL — ABNORMAL HIGH (ref 8–27)
Bilirubin Total: 0.3 mg/dL (ref 0.0–1.2)
CO2: 23 mmol/L (ref 20–29)
Calcium: 9.7 mg/dL (ref 8.6–10.2)
Chloride: 101 mmol/L (ref 96–106)
Creatinine, Ser: 1.41 mg/dL — ABNORMAL HIGH (ref 0.76–1.27)
Globulin, Total: 2.4 g/dL (ref 1.5–4.5)
Glucose: 103 mg/dL — ABNORMAL HIGH (ref 65–99)
Potassium: 4.8 mmol/L (ref 3.5–5.2)
Sodium: 139 mmol/L (ref 134–144)
Total Protein: 6.8 g/dL (ref 6.0–8.5)
eGFR: 49 mL/min/{1.73_m2} — ABNORMAL LOW (ref >59)

## 2020-07-03 LAB — LIPID PANEL
Chol/HDL Ratio: 3 ratio (ref 0.0–5.0)
Cholesterol, Total: 130 mg/dL (ref 100–199)
HDL: 43 mg/dL (ref >39)
LDL Chol Calc (NIH): 55 mg/dL (ref 0–99)
Triglycerides: 199 mg/dL — ABNORMAL HIGH (ref 0–149)
VLDL Cholesterol Cal: 32 mg/dL (ref 5–40)

## 2020-07-03 MED ORDER — HYDRALAZINE HCL 50 MG PO TABS: 75.0000 mg | ORAL_TABLET | Freq: Two times a day (BID) | ORAL | 3 refills | Status: DC

## 2020-07-03 NOTE — Patient Instructions (Signed)
Medication Instructions:   INCREASE HYDRALAZINE TO 75 MG TWICE DAILY= 1 AND 1/2 OF THE 50 MG TABLET TWICE DAILY  *If you need a refill on your cardiac medications before your next appointment, please call your pharmacy*   Lab Work:  Your physician recommends that you HAVE LAB WORK TODAY  If you have labs (blood work) drawn today and your tests are completely normal, you will receive your results only by: [place] (if you have MyChart) OR A paper copy in the mail If you have any lab test that is abnormal or we need to change your treatment, we will call you to review the results.   Testing/Procedures:  Your physician has requested that you have an echocardiogram. Echocardiography is a painless test that uses sound waves to create images of your heart. It provides your doctor with information about the size and shape of your heart and how well your heart's chambers and valves are working. This procedure takes approximately one hour. There are no restrictions for this procedure. [place]   Follow-Up: At [facility], you and your health needs are our priority.  As part of our continuing mission to provide you with exceptional heart care, we have created designated Provider Care Teams.  These Care Teams include your primary Cardiologist (physician) and Advanced Practice Providers (APPs -  Physician Assistants and Nurse Practitioners) who all work together to provide you with the care you need, when you need it.  We recommend signing up for the patient portal called "MyChart".  Sign up information is provided on this After Visit Summary.  MyChart is used to connect with patients for Virtual Visits (Telemedicine).  Patients are able to view lab/test results, encounter notes, upcoming appointments, etc.  Non-urgent messages can be sent to your provider as well.   To learn more about what you can do with MyChart, go to [email or web address].    Your next appointment:    6 month(s)  The format for your next appointment:   In Person  Provider:   Kirk Ruths, MD

## 2020-07-17 ENCOUNTER — Other Ambulatory Visit: Payer: Self-pay | Admitting: Internal Medicine

## 2020-08-01 ENCOUNTER — Other Ambulatory Visit: Payer: Self-pay | Admitting: Internal Medicine

## 2020-08-02 ENCOUNTER — Ambulatory Visit (HOSPITAL_COMMUNITY): Payer: Medicare Other | Attending: Cardiology

## 2020-08-02 ENCOUNTER — Other Ambulatory Visit: Payer: Self-pay

## 2020-08-02 DIAGNOSIS — I359 Nonrheumatic aortic valve disorder, unspecified: Secondary | ICD-10-CM | POA: Diagnosis present

## 2020-08-02 LAB — ECHOCARDIOGRAM COMPLETE
Area-P 1/2: 2.63 cm2
P 1/2 time: 415 msec
S' Lateral: 3 cm

## 2020-08-09 ENCOUNTER — Other Ambulatory Visit: Payer: Self-pay

## 2020-08-09 ENCOUNTER — Encounter: Payer: Self-pay | Admitting: Internal Medicine

## 2020-08-09 ENCOUNTER — Ambulatory Visit: Payer: Medicare Other | Admitting: Internal Medicine

## 2020-08-09 VITALS — BP 138/60 | HR 61 | Temp 97.8°F | Resp 16 | Ht 74.0 in | Wt 221.2 lb

## 2020-08-09 DIAGNOSIS — I10 Essential (primary) hypertension: Secondary | ICD-10-CM | POA: Diagnosis not present

## 2020-08-09 DIAGNOSIS — E785 Hyperlipidemia, unspecified: Secondary | ICD-10-CM | POA: Diagnosis not present

## 2020-08-09 DIAGNOSIS — E1140 Type 2 diabetes mellitus with diabetic neuropathy, unspecified: Secondary | ICD-10-CM | POA: Diagnosis not present

## 2020-08-09 DIAGNOSIS — I359 Nonrheumatic aortic valve disorder, unspecified: Secondary | ICD-10-CM

## 2020-08-09 LAB — HEMOGLOBIN A1C: Hgb A1c MFr Bld: 6.5 % (ref 4.6–6.5)

## 2020-08-09 NOTE — Patient Instructions (Addendum)
Continue checking your blood pressures regularly BP GOAL is between 110/65 and  135/85. If it is consistently higher or lower, let me know  For swelling: Avoid excessive salt in your diet Leg elevation twice daily Use compression stockings during the day, put them on early in the morning.  For pain: Try to stay active Tylenol  500 mg OTC 2 tabs a day every 8 hours as needed for pain   GO TO THE LAB : Get the blood work     [place], [place] back for   a checkup in 4 to 5 months

## 2020-08-09 NOTE — Progress Notes (Signed)
Subjective:    Patient ID: William Faulkner, male    DOB: 1934-06-28, 86 y.o.   MRN: [id number]  DOS:  08/09/2020 Type of visit - description: f/u  Notes from cardiology and test ordered by cardiology reviewed No new concerns, continue reporting:  Lower extremity edema.  No orthopnea Ongoing neuropathy symptoms, will they  get worse?Marland Kitchen Continue  neck and other MSK   pains on and off.  Review of Systems See above   Past Medical History:  Diagnosis Date   AORTIC ANEUR UNSPEC SITE WITHOUT MENTION RUPTURE    AORTIC INSUFFICIENCY    BPH (benign prostatic hyperplasia)    CAD, ARTERY BYPASS GRAFT    Cancer ([place])    CEREBROVASCULAR DISEASE    DIAB W/O COMP TYPE II/UNS NOT STATED UNCNTRL    Hyperlipidemia    HYPERTENSION, ESSENTIAL NOS    NEOPLASM, MALIGNANT, BLADDER, HX OF    Rotator cuff tear    Left shoulder,     Past Surgical History:  Procedure Laterality Date   BACK SURGERY  2003   lower   BLADDER TUMOR EXCISION     Surgery x6, Dr.Tannebaum   BLEPHAROPLASTY  1980   bilaterally, Dr Gershon Crane   COLONOSCOPY W/ POLYPECTOMY     Dr Ree Shay [facility]   CORONARY ARTERY BYPASS GRAFT  2011   x 5   CYSTOSCOPY  09/02/2012   INGUINAL HERNIA REPAIR  10/24/2011   Procedure: HERNIA REPAIR INGUINAL ADULT;  Surgeon: Earnstine Regal, MD;  Location: [facility];  Service: General;  Laterality: Right;  Repair Right Inguinal Hernia with Mesh, Umbilical Hernia Repair with Mesh   LUMBAR EPIDURAL INJECTION  2008   ROTATOR CUFF REPAIR  2009   right; Dr Shellia Carwin   SHOULDER ARTHROSCOPY WITH ROTATOR CUFF REPAIR AND SUBACROMIAL DECOMPRESSION  02/14/2014   Right, SAD/DCR, biceps tenodesis   UMBILICAL HERNIA REPAIR  10/24/2011   Procedure: HERNIA REPAIR UMBILICAL ADULT;  Surgeon: Earnstine Regal, MD;  Location: [facility];  Service: General;  Laterality: N/A;    Allergies as of 08/09/2020       Reactions   Atorvastatin Other (See Comments)   REACTION: increased cpk   Celecoxib Other (See Comments)    REACTION: palpitations= celebrex   Sulfonamide Derivatives Other (See Comments)   Unknown   Zolpidem Tartrate Other (See Comments)   REACTION: made patient feel weird= ambien        Medication List        Accurate as of August 09, 2020  9:53 AM. If you have any questions, ask your nurse or doctor.          STOP taking these medications    Pfizer-BioNTech COVID-19 Vacc 30 MCG/0.3ML injection Generic drug: COVID-19 mRNA vaccine Therapist, music) Stopped by: Kathlene November, MD       TAKE these medications    amoxicillin-clavulanate 875-125 MG tablet Commonly known as: Augmentin Take 1 tablet by mouth 2 (two) times daily.   aspirin 81 MG tablet Take 81 mg by mouth daily.   benazepril 40 MG tablet Commonly known as: LOTENSIN Take 0.5 tablets (20 mg total) by mouth daily.   cholecalciferol 1000 units tablet Commonly known as: VITAMIN D Take 1,000 Units by mouth daily.   gabapentin 600 MG tablet Commonly known as: NEURONTIN Take 1 tablet (600 mg total) by mouth 3 (three) times daily.   hydrALAZINE 50 MG tablet Commonly known as: APRESOLINE Take 1.5 tablets (75 mg total) by mouth in the morning and at bedtime.  hydrochlorothiazide 25 MG tablet Commonly known as: HYDRODIURIL TAKE 1 TABLET BY MOUTH  DAILY   ketoconazole 2 % cream Commonly known as: NIZORAL Apply 1 application topically daily.   metFORMIN 850 MG tablet Commonly known as: GLUCOPHAGE Take 1 tablet (850 mg total) by mouth 2 (two) times daily with a meal.   propranolol ER 80 MG 24 hr capsule Commonly known as: INDERAL LA TAKE 1 CAPSULE BY MOUTH  DAILY   rosuvastatin 20 MG tablet Commonly known as: CRESTOR TAKE 1 TABLET BY MOUTH  DAILY   timolol 0.5 % ophthalmic solution Commonly known as: TIMOPTIC Place 1 drop into both eyes every morning. As directed           Objective:   Physical Exam BP 138/60 (BP Location: Left Arm, Patient Position: Sitting, Cuff Size: Normal)   Pulse 61   Temp 97.8 F  (36.6 C) (Oral)   Resp 16   Ht 6\' 2"  (1.88 m)   Wt 221 lb 4 oz (100.4 kg)   SpO2 97%   BMI 28.41 kg/m  General:   Well developed, NAD, BMI noted. HEENT:  Normocephalic . Face symmetric, atraumatic Lungs:  CTA B Normal respiratory effort, no intercostal retractions, no accessory muscle use. Heart: RRR, soft systolic murmur.  Lower extremities: Trace pretibial edema bilaterally  Skin: Not pale. Not jaundice Neurologic:  alert & oriented X3.  Speech normal, gait appropriate for age and unassisted Psych--  Cognition and judgment appear intact.  Cooperative with normal attention span and concentration.  Behavior appropriate. No anxious or depressed appearing.      Assessment     Assessment Diabetes HTN Hyperlipidemia Pain management: *Neuropathy (painful type):  ---09-3808: Normal F75, folic acid, RPR. CK slightly elevated. NCS severe polyneuropathy ---Re-eval by neuro 06-2016: exam c/w neuropathy. Labs (-) ---intolerant  to lyrica ; intolerant to Elavil 04-2017 *MSK: h/o back surgery 2003 *Claudication:   Unremarkable ABIs 2017, back MRI 02-2016 (spinal stenosis not mention on the  report), saw Dr. Rita Ohara, symptoms not neurogenic.  Probably small vessel disease PVD. ABIs neg again 12-2016  CV: -CAD, CABG -Aortic insufficiency, dilated aortic root -Carotid artery disease 40-59% 10-2014, [place] 11-2015 : 1-39%, next per cards  -Ascending aortic aneurysm GU: BPH, bladder cancer Thyroid nodules: Small per [place]  03-2014 Cholelithiasis by CT 01-2014 Preglaucoma  PLAN: DM: Check A1c, on metformin and Januvia. CV: Saw cardiology 06/2020, felt to be stable - Echo for aortic insufficiency follow-up >>> Moderate AI. - BP was somewhat elevated, hydralazine dose increased. - Next CTA thoracic aorta 11-2020. HTN: See above, ambulatory BPs better per patient, BP very good today.  No change Hyperlipidemia: LDL last month very good. Lower extremity edema: Ongoing concern from the  patient, trace edema on exam, no orthopnea.  Recommend low-salt diet, leg elevation and compression stockings. DJD: Ongoing aches and pains, recommend to stay active, Tylenol.  Medication such as narcotics and not recommended and patient would not desire them. Preventive care: COVID-vaccine #4 recommended. History of UTI, see last visit, saw urology last visit 05/23/2020. CT hematuria protocol 05/11/2020 was done  >>  Nonenhancing bladder lesion. Refused cystoscopy 05/23/2020. RTC 5 to 6 months   This visit occurred during the SARS-CoV-2 public health emergency.  Safety protocols were in place, including screening questions prior to the visit, additional usage of staff PPE, and extensive cleaning of exam room while observing appropriate contact time as indicated for disinfecting solutions.

## 2020-08-10 NOTE — Assessment & Plan Note (Signed)
DM: Check A1c, on metformin and Januvia. CV: Saw cardiology 06/2020, felt to be stable - Echo for aortic insufficiency follow-up >>> Moderate AI. - BP was somewhat elevated, hydralazine dose increased. - Next CTA thoracic aorta 11-2020. HTN: See above, ambulatory BPs better per patient, BP very good today.  No change Hyperlipidemia: LDL last month very good. Lower extremity edema: Ongoing concern from the patient, trace edema on exam, no orthopnea.  Recommend low-salt diet, leg elevation and compression stockings. DJD: Ongoing aches and pains, recommend to stay active, Tylenol.  Medication such as narcotics and not recommended and patient would not desire them. Preventive care: COVID-vaccine #4 recommended. History of UTI, see last visit, saw urology last visit 05/23/2020. CT hematuria protocol 05/11/2020 was done  >>  Nonenhancing bladder lesion. Refused cystoscopy 05/23/2020. RTC 5 to 6 months

## 2020-08-21 ENCOUNTER — Other Ambulatory Visit: Payer: Self-pay | Admitting: Internal Medicine

## 2020-12-05 ENCOUNTER — Other Ambulatory Visit: Payer: Self-pay | Admitting: Internal Medicine

## 2020-12-27 ENCOUNTER — Encounter: Payer: Self-pay | Admitting: Internal Medicine

## 2020-12-27 ENCOUNTER — Ambulatory Visit: Payer: Medicare Other | Admitting: Internal Medicine

## 2020-12-27 VITALS — BP 122/82 | HR 65 | Temp 98.0°F | Resp 18 | Ht 74.0 in | Wt 226.5 lb

## 2020-12-27 DIAGNOSIS — E1140 Type 2 diabetes mellitus with diabetic neuropathy, unspecified: Secondary | ICD-10-CM | POA: Diagnosis not present

## 2020-12-27 DIAGNOSIS — I10 Essential (primary) hypertension: Secondary | ICD-10-CM | POA: Diagnosis not present

## 2020-12-27 DIAGNOSIS — R609 Edema, unspecified: Secondary | ICD-10-CM | POA: Diagnosis not present

## 2020-12-27 LAB — CBC WITH DIFFERENTIAL/PLATELET
Basophils Absolute: 0 10*3/uL (ref 0.0–0.1)
Basophils Relative: 0.6 % (ref 0.0–3.0)
Eosinophils Absolute: 0.3 10*3/uL (ref 0.0–0.7)
Eosinophils Relative: 4.7 % (ref 0.0–5.0)
HCT: 40.2 % (ref 39.0–52.0)
Hemoglobin: 12.9 g/dL — ABNORMAL LOW (ref 13.0–17.0)
Lymphocytes Relative: 22 % (ref 12.0–46.0)
Lymphs Abs: 1.4 10*3/uL (ref 0.7–4.0)
MCHC: 32.2 g/dL (ref 30.0–36.0)
MCV: 88.6 fl (ref 78.0–100.0)
Monocytes Absolute: 0.6 10*3/uL (ref 0.1–1.0)
Monocytes Relative: 9.4 % (ref 3.0–12.0)
Neutro Abs: 4 10*3/uL (ref 1.4–7.7)
Neutrophils Relative %: 63.3 % (ref 43.0–77.0)
Platelets: 208 10*3/uL (ref 150.0–400.0)
RBC: 4.54 Mil/uL (ref 4.22–5.81)
RDW: 13.2 % (ref 11.5–15.5)
WBC: 6.3 10*3/uL (ref 4.0–10.5)

## 2020-12-27 LAB — BASIC METABOLIC PANEL
BUN: 28 mg/dL — ABNORMAL HIGH (ref 6–23)
CO2: 28 mEq/L (ref 19–32)
Calcium: 9.6 mg/dL (ref 8.4–10.5)
Chloride: 105 mEq/L (ref 96–112)
Creatinine, Ser: 1.5 mg/dL (ref 0.40–1.50)
GFR: 42.47 mL/min — ABNORMAL LOW (ref >60.00)
Glucose, Bld: 105 mg/dL — ABNORMAL HIGH (ref 70–99)
Potassium: 4.7 mEq/L (ref 3.5–5.1)
Sodium: 139 mEq/L (ref 135–145)

## 2020-12-27 LAB — HEMOGLOBIN A1C: Hgb A1c MFr Bld: 6.4 % (ref 4.6–6.5)

## 2020-12-27 NOTE — Progress Notes (Signed)
Subjective:    Patient ID: William Faulkner, male    DOB: 27-Feb-1934, 86 y.o.   MRN: [id number]  DOS:  12/27/2020 Type of visit - description: Follow-up, here with his wife. Since the last visit has no new symptoms or complaints. Legs feel cold. Has episodic neck, shoulders backache. Edema seems to be better than usual. He fatigues easily.  Still able to do all his ADLs and driving.  Review of Systems Specifically denies fever chills. No weight loss No visual disturbances No headaches  Past Medical History:  Diagnosis Date   AORTIC ANEUR UNSPEC SITE WITHOUT MENTION RUPTURE    AORTIC INSUFFICIENCY    BPH (benign prostatic hyperplasia)    CAD, ARTERY BYPASS GRAFT    Cancer ([place])    CEREBROVASCULAR DISEASE    DIAB W/O COMP TYPE II/UNS NOT STATED UNCNTRL    Hyperlipidemia    HYPERTENSION, ESSENTIAL NOS    NEOPLASM, MALIGNANT, BLADDER, HX OF    Rotator cuff tear    Left shoulder,     Past Surgical History:  Procedure Laterality Date   BACK SURGERY  2003   lower   BLADDER TUMOR EXCISION     Surgery x6, Dr.Tannebaum   BLEPHAROPLASTY  1980   bilaterally, Dr Gershon Crane   COLONOSCOPY W/ POLYPECTOMY     Dr Ree Shay [facility]   CORONARY ARTERY BYPASS GRAFT  2011   x 5   CYSTOSCOPY  09/02/2012   INGUINAL HERNIA REPAIR  10/24/2011   Procedure: HERNIA REPAIR INGUINAL ADULT;  Surgeon: Earnstine Regal, MD;  [facility]: [facility];  Service: General;  Laterality: Right;  Repair Right Inguinal Hernia with Mesh, Umbilical Hernia Repair with Mesh   LUMBAR EPIDURAL INJECTION  2008   ROTATOR CUFF REPAIR  2009   right; Dr Shellia Carwin   SHOULDER ARTHROSCOPY WITH ROTATOR CUFF REPAIR AND SUBACROMIAL DECOMPRESSION  02/14/2014   Right, SAD/DCR, biceps tenodesis   UMBILICAL HERNIA REPAIR  10/24/2011   Procedure: HERNIA REPAIR UMBILICAL ADULT;  Surgeon: Earnstine Regal, MD;  [facility];  Service: General;  Laterality: N/A;    Allergies as of 12/27/2020       Reactions   Atorvastatin Other (See  Comments)   REACTION: increased cpk   Celecoxib Other (See Comments)   REACTION: palpitations= celebrex   Sulfonamide Derivatives Other (See Comments)   Unknown   Zolpidem Tartrate Other (See Comments)   REACTION: made patient feel weird= ambien        Medication List        Accurate as of December 27, 2020  3:54 PM. If you have any questions, ask your nurse or doctor.          aspirin 81 MG tablet Take 81 mg by mouth daily.   benazepril 40 MG tablet Commonly known as: LOTENSIN Take 0.5 tablets (20 mg total) by mouth daily.   cholecalciferol 1000 units tablet Commonly known as: VITAMIN D Take 1,000 Units by mouth daily.   gabapentin 600 MG tablet Commonly known as: NEURONTIN Take 1 tablet (600 mg total) by mouth 2 (two) times daily.   hydrALAZINE 50 MG tablet Commonly known as: APRESOLINE Take 1.5 tablets (75 mg total) by mouth in the morning and at bedtime.   hydrochlorothiazide 25 MG tablet Commonly known as: HYDRODIURIL TAKE 1 TABLET BY MOUTH  DAILY   ketoconazole 2 % cream Commonly known as: NIZORAL Apply 1 application topically daily.   metFORMIN 850 MG tablet Commonly known as: GLUCOPHAGE Take 1 tablet (850  mg total) by mouth 2 (two) times daily with a meal.   propranolol ER 80 MG 24 hr capsule Commonly known as: INDERAL LA TAKE 1 CAPSULE BY MOUTH  DAILY   rosuvastatin 20 MG tablet Commonly known as: CRESTOR TAKE 1 TABLET BY MOUTH  DAILY   timolol 0.5 % ophthalmic solution Commonly known as: TIMOPTIC Place 1 drop into both eyes every morning. As directed           Objective:   Physical Exam BP 122/82 (BP Location: Right Arm, Patient Position: Sitting, Cuff Size: Small)   Pulse 65   Temp 98 F (36.7 C) (Oral)   Resp 18   Ht 6\' 2"  (1.88 m)   Wt 226 lb 8 oz (102.7 kg)   SpO2 96%   BMI 29.08 kg/m  General:   Well developed, NAD, BMI noted. HEENT:  Normocephalic . Face symmetric, atraumatic Lungs:  CTA B Normal respiratory  effort, no intercostal retractions, no accessory muscle use. Heart: RRR, soft diastolic  murmur.  Lower extremities: no pretibial edema bilaterally  Skin: Not pale. Not jaundice Neurologic:  alert & oriented X3.  Speech normal, gait appropriate for age and unassisted Psych--  Cognition and judgment appear intact.  Cooperative with normal attention span and concentration.  Behavior appropriate. No anxious or depressed appearing.      Assessment    Assessment Diabetes HTN Hyperlipidemia Pain management: *Neuropathy (painful type):  ---03-3823: Normal K53, folic acid, RPR. CK slightly elevated. NCS severe polyneuropathy ---Re-eval by neuro 06-2016: exam c/w neuropathy. Labs (-) ---intolerant  to lyrica ; intolerant to Elavil 04-2017 *MSK: h/o back surgery 2003 *Claudication:   Unremarkable ABIs 2017, back MRI 02-2016 (spinal stenosis not mentioned on the  report), saw Dr. Rita Ohara, symptoms not neurogenic.  Probably small vessel disease PVD. ABIs neg again 12-2016 CV: -CAD, CABG -Aortic insufficiency, dilated aortic root -Carotid artery disease 40-59% 10-2014, [place] 11-2015 : 1-39%, next per cards  -Ascending aortic aneurysm GU: BPH, bladder cancer Thyroid nodules: Small per [place]  03-2014 Cholelithiasis by CT 01-2014 Preglaucoma  PLAN: DM: Continue metformin, check A1c HTN: BP today is very good, continue Lotensin, hydralazine, HCTZ, Inderal.  Check BMP and CBC. Lower extremity edema: Seems very good today. DJD: Ongoing aches and pains without fever, weight loss or headaches.  Takes occasional ibuprofen, recommend to take Tylenol first-line then  ibuprofen only if symptoms continue ( to minimize NSAIDs use). Preventive care: Recommend COVID booster RTC 4 months    This visit occurred during the SARS-CoV-2 public health emergency.  Safety protocols were in place, including screening questions prior to the visit, additional usage of staff PPE, and extensive cleaning of exam room while  observing appropriate contact time as indicated for disinfecting solutions.

## 2020-12-27 NOTE — Assessment & Plan Note (Signed)
DM: Continue metformin, check A1c HTN: BP today is very good, continue Lotensin, hydralazine, HCTZ, Inderal.  Check BMP and CBC. Lower extremity edema: Seems very good today. DJD: Ongoing aches and pains without fever, weight loss or headaches.  Takes occasional ibuprofen, recommend to take Tylenol first-line then  ibuprofen only if symptoms continue ( to minimize NSAIDs use). Preventive care: Recommend COVID booster RTC 4 months

## 2020-12-27 NOTE — Patient Instructions (Addendum)
Per our records you are due for your diabetic eye exam. Please contact your eye doctor to schedule an appointment. Please have them send copies of your office visit notes to [place]. Our fax number is [phone]. If you need a referral to an eye doctor please let us know.   Check the  blood pressure   BP GOAL is between 110/65 and  135/85. If it is consistently higher or lower, let me know    GO TO THE LAB : Get the blood work     [place], [place] back for a checkup in 4 months

## 2020-12-29 NOTE — Progress Notes (Signed)
HPI:FU CAD, aortic insufficiency, and cerebrovascular disease. A cardiac catheterization in May of 2011 revealed severe three-vessel coronary artery disease and normal LV function. He subsequently underwent coronary artery bypass and graft in May of 2011 (left internal mammary artery to left anterior descending artery, saphenous vein graft to first diagonal, saphenous vein graft obtuse marginal 1, sequential saphenous vein graft to distal right coronary and posterior descending artery). Note an intraoperative transesophageal echocardiogram showed mild to moderate aortic insufficiency and a dilated aortic root. However this was felt not to require surgical intervention at that time. ABIs August 2017 normal. Carotid Dopplers November 2017 showed 1-39% bilateral stenosis. Abd ultrasound 12/17 showed >50 in proximal to mid aorta; no aneurysm. CTA November 2021 showed 4.4 cm ascending thoracic aortic aneurysm.  Echocardiogram July 2022 showed normal LV function, mild left ventricular hypertrophy, grade 1 diastolic dysfunction, moderate aortic insufficiency, moderate dilatation of the ascending aorta at 48 mm.  Since I last saw him, he has fatigue with activities.  Dyspnea with more vigorous activities but not routine activities.  No orthopnea, PND, chest pain or syncope.  Mild pedal edema.  Current Outpatient Medications  Medication Sig Dispense Refill   aspirin 81 MG tablet Take 81 mg by mouth daily.     benazepril (LOTENSIN) 40 MG tablet Take 0.5 tablets (20 mg total) by mouth daily. 45 tablet 1   cholecalciferol (VITAMIN D) 1000 UNITS tablet Take 1,000 Units by mouth daily.     gabapentin (NEURONTIN) 600 MG tablet Take 1 tablet (600 mg total) by mouth 2 (two) times daily.     hydrALAZINE (APRESOLINE) 50 MG tablet Take 1.5 tablets (75 mg total) by mouth in the morning and at bedtime. 270 tablet 3   hydrochlorothiazide (HYDRODIURIL) 25 MG tablet TAKE 1 TABLET BY MOUTH  DAILY 30 tablet 11    ketoconazole (NIZORAL) 2 % cream Apply 1 application topically daily. 30 g 0   metFORMIN (GLUCOPHAGE) 850 MG tablet Take 1 tablet (850 mg total) by mouth 2 (two) times daily with a meal. 180 tablet 1   propranolol ER (INDERAL LA) 80 MG 24 hr capsule TAKE 1 CAPSULE BY MOUTH  DAILY 90 capsule 3   rosuvastatin (CRESTOR) 20 MG tablet TAKE 1 TABLET BY MOUTH  DAILY 90 tablet 3   timolol (TIMOPTIC) 0.5 % ophthalmic solution Place 1 drop into both eyes every morning. As directed     No current facility-administered medications for this visit.     Past Medical History:  Diagnosis Date   AORTIC ANEUR UNSPEC SITE WITHOUT MENTION RUPTURE    AORTIC INSUFFICIENCY    BPH (benign prostatic hyperplasia)    CAD, ARTERY BYPASS GRAFT    Cancer ([place])    CEREBROVASCULAR DISEASE    DIAB W/O COMP TYPE II/UNS NOT STATED UNCNTRL    Hyperlipidemia    HYPERTENSION, ESSENTIAL NOS    NEOPLASM, MALIGNANT, BLADDER, HX OF    Rotator cuff tear    Left shoulder,     Past Surgical History:  Procedure Laterality Date   BACK SURGERY  2003   lower   BLADDER TUMOR EXCISION     Surgery x6, Dr.Tannebaum   BLEPHAROPLASTY  1980   bilaterally, Dr Gershon Crane   COLONOSCOPY W/ POLYPECTOMY     Dr Ree Shay [facility]   CORONARY ARTERY BYPASS GRAFT  2011   x 5   CYSTOSCOPY  09/02/2012   INGUINAL HERNIA REPAIR  10/24/2011   Procedure: HERNIA REPAIR INGUINAL ADULT;  Surgeon: Sherren Mocha  Leeanne Mannan, MD;  Location: [facility];  Service: General;  Laterality: Right;  Repair Right Inguinal Hernia with Mesh, Umbilical Hernia Repair with Mesh   LUMBAR EPIDURAL INJECTION  2008   ROTATOR CUFF REPAIR  2009   right; Dr Shellia Carwin   SHOULDER ARTHROSCOPY WITH ROTATOR CUFF REPAIR AND SUBACROMIAL DECOMPRESSION  02/14/2014   Right, SAD/DCR, biceps tenodesis   UMBILICAL HERNIA REPAIR  10/24/2011   Procedure: HERNIA REPAIR UMBILICAL ADULT;  Surgeon: Earnstine Regal, MD;  Location: [facility];  Service: General;  Laterality: N/A;    Social History   Socioeconomic  History   Marital status: Married    Spouse name: Not on file   Number of children: 3   Years of education: Not on file   Highest education level: Not on file  Occupational History   Occupation: retired  Tobacco Use   Smoking status: Former    Packs/day: 0.30    Years: 15.00    Pack years: 4.50    Types: Cigarettes    Quit date: 01/22/1984    Years since quitting: 36.9   Smokeless tobacco: Never  Vaping Use   Vaping Use: Never used  Substance and Sexual Activity   Alcohol use: No    Alcohol/week: 0.0 standard drinks    Comment: rare, wine    Drug use: No   Sexual activity: Not on file  Other Topics Concern   Not on file  Social History Narrative   8 G-children   Married x 63 years   Lives with wife in a one story home.  Has 3 children.     Retired from [employer].  Education: college.    Right handed   Drinks caffeine   One story home   Social Determinants of Health   Financial Resource Strain: Not on file  Food Insecurity: Not on file  Transportation Needs: Not on file  Physical Activity: Not on file  Stress: Not on file  Social Connections: Not on file  Intimate Partner Violence: Not on file    Family History  Problem Relation Age of Onset   Diabetes Mother    Tremor Father    Other Sister    Stroke Neg Hx    Heart disease Neg Hx    Cancer Neg Hx     ROS: Neck pain and back pain but no fevers or chills, productive cough, hemoptysis, dysphasia, odynophagia, melena, hematochezia, dysuria, hematuria, rash, seizure activity, orthopnea, PND, claudication. Remaining systems are negative.  Physical Exam: Well-developed well-nourished in no acute distress.  Skin is warm and dry.  HEENT is normal.  Neck is supple.  Chest is clear to auscultation with normal expansion.  Cardiovascular exam is regular rate and rhythm.  2/6 diastolic murmur left sternal border. Abdominal exam nontender or distended. No masses palpated. Extremities show 1+ ankle edema. neuro grossly  intact  ECG-sinus bradycardia at a rate of 56, first-degree AV block, right bundle branch block.  Personally reviewed  A/P  1 coronary artery disease-patient denies chest pain.  Continue aspirin and statin.  2 thoracic aortic aneurysm-we will arrange follow-up CTA.  Given baseline renal insufficiency we will hold diuretic 2 days prior to procedure and resume day after.  3 aortic insufficiency-plan follow-up echocardiogram July 2023.  4 hypertension-blood pressure elevated; however he states typically controlled.  I will ask him to check this at home and we will advance medications as needed.  5 hyperlipidemia-continue statin.  6 history of carotid artery disease-mild on most recent Dopplers.  Kirk Ruths, MD

## 2021-01-04 ENCOUNTER — Ambulatory Visit (INDEPENDENT_AMBULATORY_CARE_PROVIDER_SITE_OTHER): Payer: Medicare Other | Admitting: Cardiology

## 2021-01-04 ENCOUNTER — Encounter: Payer: Self-pay | Admitting: Cardiology

## 2021-01-04 ENCOUNTER — Other Ambulatory Visit: Payer: Self-pay

## 2021-01-04 VITALS — BP 166/58 | HR 56 | Ht 74.0 in | Wt 223.6 lb

## 2021-01-04 DIAGNOSIS — I10 Essential (primary) hypertension: Secondary | ICD-10-CM | POA: Diagnosis not present

## 2021-01-04 DIAGNOSIS — Z951 Presence of aortocoronary bypass graft: Secondary | ICD-10-CM | POA: Diagnosis not present

## 2021-01-04 DIAGNOSIS — I712 Thoracic aortic aneurysm, without rupture, unspecified: Secondary | ICD-10-CM

## 2021-01-04 DIAGNOSIS — I359 Nonrheumatic aortic valve disorder, unspecified: Secondary | ICD-10-CM | POA: Diagnosis not present

## 2021-01-04 DIAGNOSIS — E785 Hyperlipidemia, unspecified: Secondary | ICD-10-CM

## 2021-01-04 NOTE — Patient Instructions (Addendum)
Medication Instructions:  PLEASE HOLD HYDROCHLOROTHIAZIDE 2 DAYS PRIOR TO CTA SCAN, and THE DAY OF. YOU MAY RESTART TAKING IT THE DAY AFTER THE SCAN *If you need a refill on your cardiac medications before your next appointment, please call your pharmacy*  Testing/Procedures: CTA AORTA- THIS WILL TAKE PLACE AT [facility]   Follow-Up: At [facility], you and your health needs are our priority.  As part of our continuing mission to provide you with exceptional heart care, we have created designated Provider Care Teams.  These Care Teams include your primary Cardiologist (physician) and Advanced Practice Providers (APPs -  Physician Assistants and Nurse Practitioners) who all work together to provide you with the care you need, when you need it.  Your next appointment:   6 month(s)  The format for your next appointment:   In Person  Provider: Dr. Stanford Breed

## 2021-01-06 ENCOUNTER — Encounter: Payer: Self-pay | Admitting: Cardiology

## 2021-01-16 ENCOUNTER — Ambulatory Visit (HOSPITAL_COMMUNITY)
Admission: RE | Admit: 2021-01-16 | Discharge: 2021-01-16 | Disposition: A | Discharge status: Home / Self Care | Payer: Medicare Other | Source: Ambulatory Visit | Attending: Cardiology | Admitting: Cardiology

## 2021-01-16 ENCOUNTER — Encounter (HOSPITAL_COMMUNITY): Payer: Self-pay

## 2021-01-16 ENCOUNTER — Other Ambulatory Visit: Payer: Self-pay

## 2021-01-16 DIAGNOSIS — I712 Thoracic aortic aneurysm, without rupture, unspecified: Secondary | ICD-10-CM | POA: Diagnosis present

## 2021-01-16 MED ORDER — IOHEXOL 350 MG/ML SOLN
100.0000 mL | Freq: Once | INTRAVENOUS | Status: AC | PRN
  Administered 2021-01-16: 16:00:00 75 mL via INTRAVENOUS

## 2021-01-16 MED ORDER — SODIUM CHLORIDE (PF) 0.9 % IJ SOLN
INTRAMUSCULAR | Status: AC
  Filled 2021-01-16: qty 50

## 2021-01-18 ENCOUNTER — Other Ambulatory Visit (HOSPITAL_BASED_OUTPATIENT_CLINIC_OR_DEPARTMENT_OTHER): Payer: Self-pay

## 2021-01-18 DIAGNOSIS — Z01812 Encounter for preprocedural laboratory examination: Secondary | ICD-10-CM

## 2021-01-18 DIAGNOSIS — I719 Aortic aneurysm of unspecified site, without rupture: Secondary | ICD-10-CM

## 2021-01-25 ENCOUNTER — Telehealth: Payer: Self-pay | Admitting: Internal Medicine

## 2021-01-25 NOTE — Telephone Encounter (Signed)
Robitussin-DM he is fine, following the instructions in the box Also recommend Astepro over-the-counter: 2 sprays on each side of the nose twice daily until better. If not improving needs office visit

## 2021-01-25 NOTE — Telephone Encounter (Signed)
Patient's wife is calling for advise regarding her husbands cold that has been going on for 4 days. She states he has no fever, no congestion, and just a little raspy voice that sometimes comes with a cough, with a slight clear nasal drip. He tested negative for covid 1/03, and she has been giving him robitussin DM but she is unsure if she should give him mucinex. She was offered an OV but she wasn't sure if he needed to come in since his symptoms are not bad. She just wants to know which medication to give him and which ones not to mix together. Please advice.

## 2021-01-25 NOTE — Telephone Encounter (Signed)
Spoke w/ Dianah Field- informed of recommendations. She verbalized understanding.

## 2021-01-25 NOTE — Telephone Encounter (Signed)
Please advise 

## 2021-02-03 ENCOUNTER — Other Ambulatory Visit: Payer: Self-pay | Admitting: Internal Medicine

## 2021-02-10 ENCOUNTER — Other Ambulatory Visit: Payer: Self-pay | Admitting: Cardiology

## 2021-02-14 ENCOUNTER — Other Ambulatory Visit: Payer: Self-pay | Admitting: Internal Medicine

## 2021-03-06 ENCOUNTER — Encounter: Payer: Self-pay | Admitting: Internal Medicine

## 2021-03-06 LAB — HM DIABETES EYE EXAM

## 2021-03-23 ENCOUNTER — Encounter: Payer: Self-pay | Admitting: Internal Medicine

## 2021-04-06 ENCOUNTER — Other Ambulatory Visit: Payer: Self-pay | Admitting: Internal Medicine

## 2021-04-06 NOTE — Telephone Encounter (Signed)
Pt due for appt next month, letter was sent on 03/23/21. ?

## 2021-04-26 ENCOUNTER — Telehealth: Payer: Self-pay | Admitting: Internal Medicine

## 2021-04-26 NOTE — Telephone Encounter (Signed)
Spoke to spouse to schedule Medicare Annual Wellness Visit (AWV) either virtually or phone ? ?She wanted a call back Monday 04/30/21 to schedule ? ?Last AWV ;07/28/14 ? please schedule at anytime with health coach ? ? ?

## 2021-04-30 ENCOUNTER — Other Ambulatory Visit: Payer: Self-pay | Admitting: Internal Medicine

## 2021-05-03 ENCOUNTER — Telehealth: Payer: Self-pay | Admitting: Internal Medicine

## 2021-05-03 NOTE — Telephone Encounter (Signed)
Left message for patient to call back and schedule Medicare Annual Wellness Visit (AWV) either virtually or phone ? ? ?Last AWV ; 07/28/14 ?please schedule at anytime with health coach ? ? ?

## 2021-05-10 ENCOUNTER — Other Ambulatory Visit: Payer: Self-pay | Admitting: Cardiology

## 2021-05-16 ENCOUNTER — Ambulatory Visit: Payer: Medicare Other | Admitting: Internal Medicine

## 2021-05-16 ENCOUNTER — Encounter: Payer: Self-pay | Admitting: Internal Medicine

## 2021-05-16 VITALS — BP 112/58 | HR 60 | Temp 97.9°F | Resp 12 | Ht 74.0 in | Wt 218.8 lb

## 2021-05-16 DIAGNOSIS — R634 Abnormal weight loss: Secondary | ICD-10-CM | POA: Diagnosis not present

## 2021-05-16 DIAGNOSIS — I10 Essential (primary) hypertension: Secondary | ICD-10-CM | POA: Diagnosis not present

## 2021-05-16 DIAGNOSIS — E559 Vitamin D deficiency, unspecified: Secondary | ICD-10-CM | POA: Diagnosis not present

## 2021-05-16 DIAGNOSIS — E1140 Type 2 diabetes mellitus with diabetic neuropathy, unspecified: Secondary | ICD-10-CM | POA: Diagnosis not present

## 2021-05-16 DIAGNOSIS — Z23 Encounter for immunization: Secondary | ICD-10-CM | POA: Diagnosis not present

## 2021-05-16 DIAGNOSIS — E785 Hyperlipidemia, unspecified: Secondary | ICD-10-CM

## 2021-05-16 LAB — COMPREHENSIVE METABOLIC PANEL
ALT: 13 U/L (ref 0–53)
AST: 18 U/L (ref 0–37)
Albumin: 4.2 g/dL (ref 3.5–5.2)
Alkaline Phosphatase: 51 U/L (ref 39–117)
BUN: 33 mg/dL — ABNORMAL HIGH (ref 6–23)
CO2: 28 mEq/L (ref 19–32)
Calcium: 9.7 mg/dL (ref 8.4–10.5)
Chloride: 104 mEq/L (ref 96–112)
Creatinine, Ser: 1.59 mg/dL — ABNORMAL HIGH (ref 0.40–1.50)
GFR: 39.49 mL/min — ABNORMAL LOW (ref >60.00)
Glucose, Bld: 86 mg/dL (ref 70–99)
Potassium: 5.2 mEq/L — ABNORMAL HIGH (ref 3.5–5.1)
Sodium: 140 mEq/L (ref 135–145)
Total Bilirubin: 0.4 mg/dL (ref 0.2–1.2)
Total Protein: 6.6 g/dL (ref 6.0–8.3)

## 2021-05-16 LAB — LIPID PANEL
Cholesterol: 124 mg/dL (ref 0–200)
HDL: 38.8 mg/dL — ABNORMAL LOW (ref >39.00)
LDL Cholesterol: 46 mg/dL (ref 0–99)
NonHDL: 85.33
Total CHOL/HDL Ratio: 3
Triglycerides: 199 mg/dL — ABNORMAL HIGH (ref 0.0–149.0)
VLDL: 39.8 mg/dL (ref 0.0–40.0)

## 2021-05-16 LAB — CBC WITH DIFFERENTIAL/PLATELET
Basophils Absolute: 0 10*3/uL (ref 0.0–0.1)
Basophils Relative: 0.6 % (ref 0.0–3.0)
Eosinophils Absolute: 0.2 10*3/uL (ref 0.0–0.7)
Eosinophils Relative: 2.8 % (ref 0.0–5.0)
HCT: 40.6 % (ref 39.0–52.0)
Hemoglobin: 12.7 g/dL — ABNORMAL LOW (ref 13.0–17.0)
Lymphocytes Relative: 20.9 % (ref 12.0–46.0)
Lymphs Abs: 1.4 10*3/uL (ref 0.7–4.0)
MCHC: 31.3 g/dL (ref 30.0–36.0)
MCV: 88.9 fl (ref 78.0–100.0)
Monocytes Absolute: 0.7 10*3/uL (ref 0.1–1.0)
Monocytes Relative: 10.3 % (ref 3.0–12.0)
Neutro Abs: 4.5 10*3/uL (ref 1.4–7.7)
Neutrophils Relative %: 65.4 % (ref 43.0–77.0)
Platelets: 206 10*3/uL (ref 150.0–400.0)
RBC: 4.57 Mil/uL (ref 4.22–5.81)
RDW: 13.8 % (ref 11.5–15.5)
WBC: 6.9 10*3/uL (ref 4.0–10.5)

## 2021-05-16 LAB — TSH: TSH: 3.03 u[IU]/mL (ref 0.35–5.50)

## 2021-05-16 LAB — HEMOGLOBIN A1C: Hgb A1c MFr Bld: 6.3 % (ref 4.6–6.5)

## 2021-05-16 LAB — VITAMIN D 25 HYDROXY (VIT D DEFICIENCY, FRACTURES): VITD: 48.32 ng/mL (ref 30.00–100.00)

## 2021-05-16 NOTE — Patient Instructions (Signed)
Check the  blood pressure regularly ?BP GOAL is between 110/65 and  135/85. ?If it is consistently higher or lower, let me know ? ? ?Consider Shingrix vaccine ? ? ?GO TO THE LAB : Get the blood work   ? ? ?William Faulkner, William Faulkner ?Come back for a physical exam in 4 to 5 months ? ? ? ?"Living will", "Health Care Power of attorney": Advanced care planning ? ?(If you already have a living will or healthcare power of attorney, please bring the copy to be scanned in your chart.) ? ?Advance care planning is a process that supports adults in  understanding and sharing their preferences regarding future medical care.  ? ?The patient's preferences are recorded in documents called Advance Directives.    ?Advanced directives are completed (and can be modified at any time) while the patient is in full mental capacity.  ? ?The documentation should be available at all times to the patient, the family and the healthcare providers.  ?Bring in a copy to be scanned in your chart is an excellent idea and is recommended  ? ?This legal documents direct treatment decision making and/or appoint a surrogate to make the decision if the patient is not capable to do so.  ? ? ?Advance directives can be documented in many types of formats,  documents have names such as:  ?Lliving will  ?Durable power of attorney for healthcare (healthcare proxy or healthcare power of attorney)  ?Combined directives  ?Physician orders for life-sustaining treatment  ?  ?More information at: ? [email or web address]  ?

## 2021-05-16 NOTE — Progress Notes (Signed)
? ?Subjective:  ? ? Patient ID: William Faulkner, male    DOB: [birth year], 87 y.o.   MRN: [id number] ? ?DOS:  05/16/2021 ?Type of visit - description: f/u ? ?Since the last visit he is doing about the same. ?Continue with aches and pains, unable to exercise or be as active as he would like to. ?Continue to be somewhat frustrated about it. ?He denies fever or chills.  no headaches. ?Mild weight loss noted. ? ? ?Wt Readings from Last 3 Encounters:  ?05/16/21 218 lb 12.8 oz (99.2 kg)  ?01/04/21 223 lb 9.6 oz (101.4 kg)  ?12/27/20 226 lb 8 oz (102.7 kg)  ? ? ? ? ?Review of Systems ?Denies chest pain, no palpitations. ?Lower extremity edema: At baseline. ?Occasionally he has DOE if he hurry up or do something more than ADLs ? ?Past Medical History:  ?Diagnosis Date  ? AORTIC ANEUR UNSPEC SITE WITHOUT MENTION RUPTURE   ? AORTIC INSUFFICIENCY   ? BPH (benign prostatic hyperplasia)   ? CAD, ARTERY BYPASS GRAFT   ? Cancer [facility])   ? CEREBROVASCULAR DISEASE   ? DIAB W/O COMP TYPE II/UNS NOT STATED UNCNTRL   ? Hyperlipidemia   ? HYPERTENSION, ESSENTIAL NOS   ? NEOPLASM, MALIGNANT, BLADDER, HX OF   ? Rotator cuff tear   ? Left shoulder,   ? ? ?Past Surgical History:  ?Procedure Laterality Date  ? BACK SURGERY  2003  ? lower  ? BLADDER TUMOR EXCISION    ? Surgery x6, Dr.Tannebaum  ? BLEPHAROPLASTY  1980  ? bilaterally, Dr Gershon Crane  ? COLONOSCOPY W/ POLYPECTOMY    ? Dr Ree Shay GI  ? CORONARY ARTERY BYPASS GRAFT  2011  ? x 5  ? CYSTOSCOPY  09/02/2012  ? INGUINAL HERNIA REPAIR  10/24/2011  ? Procedure: HERNIA REPAIR INGUINAL ADULT;  Surgeon: Earnstine Regal, MD;  Location: [facility];  Service: General;  Laterality: Right;  Repair Right Inguinal Hernia with Mesh, Umbilical Hernia Repair with Mesh  ? LUMBAR EPIDURAL INJECTION  2008  ? ROTATOR CUFF REPAIR  2009  ? right; Dr Shellia Carwin  ? SHOULDER ARTHROSCOPY WITH ROTATOR CUFF REPAIR AND SUBACROMIAL DECOMPRESSION  02/14/2014  ? Right, SAD/DCR, biceps tenodesis  ? UMBILICAL HERNIA REPAIR   10/24/2011  ? Procedure: HERNIA REPAIR UMBILICAL ADULT;  Surgeon: Earnstine Regal, MD;  Location: [facility];  Service: General;  Laterality: N/A;  ? ? ?Current Outpatient Medications  ?Medication Instructions  ? aspirin 81 mg, Oral, Daily,    ? benazepril (LOTENSIN) 20 mg, Oral, Daily, Due for appt 04/2021  ? cholecalciferol (VITAMIN D) 1,000 Units, Oral, Daily  ? gabapentin (NEURONTIN) 600 MG tablet TAKE 1 TABLET BY MOUTH 3 TIMES  DAILY  ? hydrALAZINE (APRESOLINE) 75 mg, Oral, 2 times daily  ? hydrochlorothiazide (HYDRODIURIL) 25 mg, Oral, Daily, PATIENT MUST ATTEND SCHEDULED APPOINTMENT FOR FUTURE REFILLS  ? ketoconazole (NIZORAL) 2 % cream 1 application., Topical, Daily  ? metFORMIN (GLUCOPHAGE) 850 MG tablet TAKE 1 TABLET BY MOUTH TWICE DAILY WITH A MEAL  ? propranolol ER (INDERAL LA) 80 MG 24 hr capsule TAKE 1 CAPSULE BY MOUTH  DAILY  ? rosuvastatin (CRESTOR) 20 MG tablet TAKE 1 TABLET BY MOUTH  DAILY  ? timolol (TIMOPTIC) 0.5 % ophthalmic solution 1 drop, Every morning  ? ? ?   ?Objective:  ? Physical Exam ?BP (!) 112/58 (BP Location: Left Arm) Comment: manually  Pulse 60   Temp 97.9 ?F (36.6 ?C) (Oral)   Resp 12  Ht '6\' 2"'$  (1.88 m)   Wt 218 lb 12.8 oz (99.2 kg)   SpO2 97%   BMI 28.09 kg/m?  ?General:   ?Well developed, NAD, BMI noted.  ?HEENT:  ?Normocephalic . Face symmetric, atraumatic ?Lungs:  ?CTA B ?Normal respiratory effort, no intercostal retractions, no accessory muscle use. ?Heart: RRR,  no murmur.  ?Abdomen:  ?Not distended, soft, non-tender. No rebound or rigidity.  No organomegaly.  No bruit ?Skin: Not pale. Not jaundice ?Lower extremities: +.  Ankle edema, symmetric, bilateral. ?Neurologic:  ?alert & oriented X3.  ?Speech normal, gait appropriate for age and unassisted ?Psych--  ?Cognition and judgment appear intact.  ?Cooperative with normal attention span and concentration.  ?Behavior appropriate. ?No anxious or depressed appearing. ? ?   ?Assessment   ? ?   ?Assessment ?Diabetes ?HTN ?Hyperlipidemia ?Pain management: ?*Neuropathy (painful type):  ?---05-8848: Normal Y77, folic acid, RPR. CK slightly elevated. NCS severe polyneuropathy ?---Re-eval by neuro 06-2016: exam c/w neuropathy. Labs (-) ?---intolerant  to lyrica ; intolerant to Elavil 04-2017 ?*MSK: h/o back surgery 2003 ?*Claudication:   Unremarkable ABIs 2017, back MRI 02-2016 (spinal stenosis not mentioned on the  report), saw Dr. Rita Ohara, symptoms not neurogenic.  Probably small vessel disease PVD. ABIs neg again 12-2016 ?CV: ?-CAD, CABG ?-Aortic insufficiency, dilated aortic root ?-Carotid artery disease 40-59% 10-2014, [place] 11-2015 : 1-39%, next per cards  ?-Ascending aortic aneurysm ?GU: ?BPH, bladder cancer ?Thyroid nodules: Small per [place]  03-2014 ?Cholelithiasis by CT 01-2014 ?Preglaucoma ? ?PLAN: ?DM: No ambulatory CBGs, continue metformin, check A1c.  Further advised with results ?HTN: BP seems well controlled, continue Lotensin, hydralazine, HCTZ, propranolol.  Check a CMP and CBC. ?Hyperlipidemia: On Crestor, check FLP ?Lower extremity edema: Chronic issue, on today's exam there is some peri- ankle edema.  Low-salt and leg elevation encourage ?CAD, Ao Insuf, Dilated Ao: ?Saw cardiology 01/04/2021, note reviewed, CT angiogram showed a slightly larger aorta, next CT should be around June 2023. ?Next echo for aortic insufficiency July 2023. ?DJD: Stable aches and pains. ?Vitamin D deficiency: Checking labs, on supplements ?Wt loss: check a TSH ?Preventive care: PNM 20 today, Shingrix recommended, ACP: Info provided. ?RTC 6 months CPX  ? ?This visit occurred during the SARS-CoV-2 public health emergency.  Safety protocols were in place, including screening questions prior to the visit, additional usage of staff PPE, and extensive cleaning of exam room while observing appropriate contact time as indicated for disinfecting solutions.  ? ?

## 2021-05-17 NOTE — Assessment & Plan Note (Signed)
DM: No ambulatory CBGs, continue metformin, check A1c.  Further advised with results ?HTN: BP seems well controlled, continue Lotensin, hydralazine, HCTZ, propranolol.  Check a CMP and CBC. ?Hyperlipidemia: On Crestor, check FLP ?Lower extremity edema: Chronic issue, on today's exam there is some peri- ankle edema.  Low-salt and leg elevation encourage ?CAD, Ao Insuf, Dilated Ao: ?Saw cardiology 01/04/2021, note reviewed, CT angiogram showed a slightly larger aorta, next CT should be around June 2023. ?Next echo for aortic insufficiency July 2023. ?DJD: Stable aches and pains. ?Vitamin D deficiency: Checking labs, on supplements ?Wt loss: check a TSH ?Preventive care: PNM 20 today, Shingrix recommended, ACP: Info provided. ?RTC 6 months CPX ?

## 2021-06-09 ENCOUNTER — Other Ambulatory Visit: Payer: Self-pay | Admitting: Internal Medicine

## 2021-06-11 ENCOUNTER — Encounter: Payer: Self-pay | Admitting: Internal Medicine

## 2021-07-09 ENCOUNTER — Other Ambulatory Visit: Payer: Self-pay | Admitting: Internal Medicine

## 2021-07-11 NOTE — Progress Notes (Signed)
HPI: FU CAD, aortic insufficiency, TAA and cerebrovascular disease. Cardiac catheterization in 5/11 revealed severe three-vessel coronary artery disease and normal LV function. He subsequently underwent coronary artery bypass and graft in May of 2011 (left internal mammary artery to left anterior descending artery, saphenous vein graft to first diagonal, saphenous vein graft obtuse marginal 1, sequential saphenous vein graft to distal right coronary and posterior descending artery). Note an intraoperative transesophageal echocardiogram showed mild to moderate aortic insufficiency and dilated aortic root. However this was felt not to require surgical intervention at that time. ABIs August 2017 normal. Carotid Dopplers November 2017 showed 1-39% bilateral stenosis. Abd ultrasound 12/17 showed >50 in proximal to mid aorta; no aneurysm. Echocardiogram July 2022 showed normal LV function, mild left ventricular hypertrophy, grade 1 diastolic dysfunction, moderate aortic insufficiency, moderate dilatation of the ascending aorta at 48 mm.  CTA December 2022 showed 4.8 cm a sending thoracic aortic aneurysm which was increased in size.  Follow-up in 6 months recommended.  Since I last saw him, he notes increased fatigue.  Some dyspnea on exertion and lower extremity edema.  He denies chest pain, palpitations or syncope.  Current Outpatient Medications  Medication Sig Dispense Refill   aspirin 81 MG tablet Take 81 mg by mouth daily.     benazepril (LOTENSIN) 40 MG tablet Take 0.5 tablets (20 mg total) by mouth daily. Due for appt 04/2021 45 tablet 1   cholecalciferol (VITAMIN D) 1000 UNITS tablet Take 1,000 Units by mouth daily.     Cyanocobalamin ([employer] VITAMIN B-12) 5000 MCG SUBL Take 5,000 mcg by mouth daily.     gabapentin (NEURONTIN) 600 MG tablet TAKE 1 TABLET BY MOUTH 3 TIMES  DAILY (Patient taking differently: Take 600 mg by mouth 2 (two) times daily.) 270 tablet 0   hydrALAZINE (APRESOLINE) 50 MG  tablet Take 1.5 tablets (75 mg total) by mouth in the morning and at bedtime. 270 tablet 3   hydrochlorothiazide (HYDRODIURIL) 25 MG tablet Take 1 tablet (25 mg total) by mouth daily. PATIENT MUST ATTEND SCHEDULED APPOINTMENT FOR FUTURE REFILLS 90 tablet 0   ketoconazole (NIZORAL) 2 % cream Apply 1 application topically daily. 30 g 0   metFORMIN (GLUCOPHAGE) 850 MG tablet Take 1 tablet (850 mg total) by mouth 2 (two) times daily with a meal. 180 tablet 1   propranolol ER (INDERAL LA) 80 MG 24 hr capsule TAKE 1 CAPSULE BY MOUTH DAILY 90 capsule 3   rosuvastatin (CRESTOR) 20 MG tablet TAKE 1 TABLET BY MOUTH  DAILY 90 tablet 3   timolol (TIMOPTIC) 0.5 % ophthalmic solution Place 1 drop into both eyes every morning. As directed     No current facility-administered medications for this visit.     Past Medical History:  Diagnosis Date   AORTIC ANEUR UNSPEC SITE WITHOUT MENTION RUPTURE    AORTIC INSUFFICIENCY    BPH (benign prostatic hyperplasia)    CAD, ARTERY BYPASS GRAFT    Cancer ([place])    CEREBROVASCULAR DISEASE    DIAB W/O COMP TYPE II/UNS NOT STATED UNCNTRL    Hyperlipidemia    HYPERTENSION, ESSENTIAL NOS    NEOPLASM, MALIGNANT, BLADDER, HX OF    Rotator cuff tear    Left shoulder,     Past Surgical History:  Procedure Laterality Date   BACK SURGERY  2003   lower   BLADDER TUMOR EXCISION     Surgery x6, Dr.Tannebaum   BLEPHAROPLASTY  1980   bilaterally, Dr Gershon Crane  COLONOSCOPY W/ POLYPECTOMY     Dr Ree Shay GI   CORONARY ARTERY BYPASS GRAFT  2011   x 5   CYSTOSCOPY  09/02/2012   INGUINAL HERNIA REPAIR  10/24/2011   Procedure: HERNIA REPAIR INGUINAL ADULT;  Surgeon: Earnstine Regal, MD;  Location: [facility];  Service: General;  Laterality: Right;  Repair Right Inguinal Hernia with Mesh, Umbilical Hernia Repair with Mesh   LUMBAR EPIDURAL INJECTION  2008   ROTATOR CUFF REPAIR  2009   right; Dr Shellia Carwin   SHOULDER ARTHROSCOPY WITH ROTATOR CUFF REPAIR AND SUBACROMIAL  DECOMPRESSION  02/14/2014   Right, SAD/DCR, biceps tenodesis   UMBILICAL HERNIA REPAIR  10/24/2011   Procedure: HERNIA REPAIR UMBILICAL ADULT;  Surgeon: Earnstine Regal, MD;  Location: [facility];  Service: General;  Laterality: N/A;    Social History   Socioeconomic History   Marital status: Married    Spouse name: Not on file   Number of children: 3   Years of education: Not on file   Highest education level: Not on file  Occupational History   Occupation: retired  Tobacco Use   Smoking status: Former    Packs/day: 0.30    Years: 15.00    Total pack years: 4.50    Types: Cigarettes    Quit date: 01/22/1984    Years since quitting: 37.5   Smokeless tobacco: Never  Vaping Use   Vaping Use: Never used  Substance and Sexual Activity   Alcohol use: No    Alcohol/week: 0.0 standard drinks of alcohol    Comment: rare, wine    Drug use: No   Sexual activity: Not on file  Other Topics Concern   Not on file  Social History Narrative   8 G-children   Married x 63 years   Lives with wife in a one story home.  Has 3 children.     Retired from [employer].  Education: college.    Right handed   Drinks caffeine   One story home   Social Determinants of Health   Financial Resource Strain: Not on file  Food Insecurity: Not on file  Transportation Needs: Not on file  Physical Activity: Not on file  Stress: Not on file  Social Connections: Not on file  Intimate Partner Violence: Not on file    Family History  Problem Relation Age of Onset   Diabetes Mother    Tremor Father    Other Sister    Stroke Neg Hx    Heart disease Neg Hx    Cancer Neg Hx     ROS: General malaise, back pain and leg weakness but no fevers or chills, productive cough, hemoptysis, dysphasia, odynophagia, melena, hematochezia, dysuria, hematuria, rash, seizure activity, orthopnea, PND, claudication. Remaining systems are negative.  Physical Exam: Well-developed well-nourished in no acute distress.  Skin is warm  and dry.  HEENT is normal.  Neck is supple.  Chest is clear to auscultation with normal expansion.  Cardiovascular exam is regular rate and rhythm.  2/6 systolic and diastolic murmur left sternal border. Abdominal exam nontender or distended. No masses palpated. Extremities show trace to 1+ ankle edema. neuro grossly intact  ECG-normal sinus rhythm with first-degree AV block, right bundle branch block, cannot rule out inferior infarct.  Personally reviewed  A/P  1 coronary artery disease-patient denies chest pain.  Continue aspirin and statin.  2 thoracic aortic aneurysm-most recent CTA showed enlarging ascending aorta.  We will arrange follow-up CTA to further assess.  We  will hold hydrochlorothiazide 2 days prior to CTA given mild baseline renal insufficiency.  I have also asked him to increase p.o. fluid intake prior to the imaging.  Hold metformin for 48 hours following procedure.  3 aortic insufficiency-plan follow-up echocardiogram July 2023.  4 hypertension-blood pressure controlled.  Continue present medical regimen.  Note he is on Inderal LA for history of tremor.  However he has a long first-degree AV block.  I will decrease to 40 mg daily to see if he will tolerate lower doses.  5 hyperlipidemia-continue statin.  6 carotid artery disease-mild on most recent Dopplers.  Kirk Ruths, MD

## 2021-07-20 ENCOUNTER — Ambulatory Visit (INDEPENDENT_AMBULATORY_CARE_PROVIDER_SITE_OTHER): Payer: Medicare Other | Admitting: Cardiology

## 2021-07-20 ENCOUNTER — Encounter: Payer: Self-pay | Admitting: Cardiology

## 2021-07-20 VITALS — BP 120/58 | HR 66 | Ht 73.0 in | Wt 213.6 lb

## 2021-07-20 DIAGNOSIS — I359 Nonrheumatic aortic valve disorder, unspecified: Secondary | ICD-10-CM

## 2021-07-20 DIAGNOSIS — I2510 Atherosclerotic heart disease of native coronary artery without angina pectoris: Secondary | ICD-10-CM

## 2021-07-20 DIAGNOSIS — E785 Hyperlipidemia, unspecified: Secondary | ICD-10-CM | POA: Diagnosis not present

## 2021-07-20 DIAGNOSIS — I712 Thoracic aortic aneurysm, without rupture, unspecified: Secondary | ICD-10-CM | POA: Diagnosis not present

## 2021-07-20 DIAGNOSIS — I10 Essential (primary) hypertension: Secondary | ICD-10-CM

## 2021-07-20 MED ORDER — PROPRANOLOL HCL 40 MG PO TABS: ORAL_TABLET | ORAL | 3 refills | Status: DC

## 2021-07-20 NOTE — Addendum Note (Signed)
Addended by: Kathyrn Lass on: 07/20/2021 10:29 AM   Modules accepted: Orders

## 2021-07-20 NOTE — Patient Instructions (Addendum)
Medication Instructions:  Decrease Propranolol to 40 mg daily Continue all other medications *If you need a refill on your cardiac medications before your next appointment, please call your pharmacy*   Lab Work: Bmet today If you have labs (blood work) drawn today and your tests are completely normal, you will receive your results only by: [place] (if you have MyChart) OR A paper copy in the mail If you have any lab test that is abnormal or we need to change your treatment, we will call you to review the results.   Testing/Procedures: Echo  Chest CTA Thoracic Aorta       Drink plenty of fluids day before test    Hold HCTZ 2 days before test    Hold Metformin 2 days after test         Follow-Up: At [facility], you and your health needs are our priority.  As part of our continuing mission to provide you with exceptional heart care, we have created designated Provider Care Teams.  These Care Teams include your primary Cardiologist (physician) and Advanced Practice Providers (APPs -  Physician Assistants and Nurse Practitioners) who all work together to provide you with the care you need, when you need it.  We recommend signing up for the patient portal called "MyChart".  Sign up information is provided on this After Visit Summary.  MyChart is used to connect with patients for Virtual Visits (Telemedicine).  Patients are able to view lab/test results, encounter notes, upcoming appointments, etc.  Non-urgent messages can be sent to your provider as well.   To learn more about what you can do with MyChart, go to [email or web address].    Your next appointment:  6 months    The format for your next appointment: Office   Provider:  Dr.Crenshaw   Important Information About Sugar

## 2021-07-21 LAB — BASIC METABOLIC PANEL
BUN/Creatinine Ratio: 26 — ABNORMAL HIGH (ref 10–24)
BUN: 37 mg/dL — ABNORMAL HIGH (ref 8–27)
CO2: 23 mmol/L (ref 20–29)
Calcium: 10 mg/dL (ref 8.6–10.2)
Chloride: 103 mmol/L (ref 96–106)
Creatinine, Ser: 1.4 mg/dL — ABNORMAL HIGH (ref 0.76–1.27)
Glucose: 118 mg/dL — ABNORMAL HIGH (ref 70–99)
Potassium: 5.5 mmol/L — ABNORMAL HIGH (ref 3.5–5.2)
Sodium: 139 mmol/L (ref 134–144)
eGFR: 49 mL/min/{1.73_m2} — ABNORMAL LOW (ref >59)

## 2021-07-23 ENCOUNTER — Telehealth: Payer: Self-pay | Admitting: *Deleted

## 2021-07-23 DIAGNOSIS — E875 Hyperkalemia: Secondary | ICD-10-CM

## 2021-07-23 MED ORDER — BENAZEPRIL HCL 10 MG PO TABS: 10.0000 mg | ORAL_TABLET | Freq: Every day | ORAL | 3 refills | Status: DC

## 2021-07-23 NOTE — Telephone Encounter (Signed)
Spoke with pt wife, Aware of dr Jacalyn Lefevre recommendations.  New script sent to the pharmacy  Lab orders mailed to the pt

## 2021-07-23 NOTE — Telephone Encounter (Signed)
Spoke with pt wife, she reports he is taking 20 mg of benazepril currently. Will forward for dr Stanford Breed review

## 2021-07-23 NOTE — Telephone Encounter (Signed)
-----   Message from Lelon [place], MD sent at 07/21/2021  7:24 AM EDT ----- Change lisinopril to 20 mg daily; follow BP; low K diet; bmet one week Kirk Ruths

## 2021-07-26 ENCOUNTER — Encounter: Payer: Self-pay | Admitting: Internal Medicine

## 2021-07-26 ENCOUNTER — Ambulatory Visit (HOSPITAL_COMMUNITY)
Admission: RE | Admit: 2021-07-26 | Discharge: 2021-07-26 | Disposition: A | Discharge status: Home / Self Care | Payer: Medicare Other | Source: Ambulatory Visit | Attending: Cardiology | Admitting: Cardiology

## 2021-07-26 ENCOUNTER — Encounter (HOSPITAL_COMMUNITY): Payer: Self-pay

## 2021-07-26 DIAGNOSIS — I10 Essential (primary) hypertension: Secondary | ICD-10-CM | POA: Insufficient documentation

## 2021-07-26 DIAGNOSIS — I359 Nonrheumatic aortic valve disorder, unspecified: Secondary | ICD-10-CM | POA: Diagnosis present

## 2021-07-26 DIAGNOSIS — I2510 Atherosclerotic heart disease of native coronary artery without angina pectoris: Secondary | ICD-10-CM | POA: Diagnosis present

## 2021-07-26 DIAGNOSIS — E785 Hyperlipidemia, unspecified: Secondary | ICD-10-CM | POA: Insufficient documentation

## 2021-07-26 DIAGNOSIS — I712 Thoracic aortic aneurysm, without rupture, unspecified: Secondary | ICD-10-CM | POA: Diagnosis present

## 2021-07-26 MED ORDER — IOHEXOL 350 MG/ML SOLN
80.0000 mL | Freq: Once | INTRAVENOUS | Status: AC | PRN
  Administered 2021-07-26: 80 mL via INTRAVENOUS

## 2021-08-08 ENCOUNTER — Ambulatory Visit (HOSPITAL_COMMUNITY): Payer: Medicare Other | Attending: Internal Medicine

## 2021-08-08 DIAGNOSIS — I2510 Atherosclerotic heart disease of native coronary artery without angina pectoris: Secondary | ICD-10-CM

## 2021-08-08 DIAGNOSIS — I10 Essential (primary) hypertension: Secondary | ICD-10-CM | POA: Diagnosis not present

## 2021-08-08 DIAGNOSIS — I359 Nonrheumatic aortic valve disorder, unspecified: Secondary | ICD-10-CM

## 2021-08-08 DIAGNOSIS — I712 Thoracic aortic aneurysm, without rupture, unspecified: Secondary | ICD-10-CM

## 2021-08-08 DIAGNOSIS — E785 Hyperlipidemia, unspecified: Secondary | ICD-10-CM | POA: Diagnosis not present

## 2021-08-08 LAB — ECHOCARDIOGRAM COMPLETE
Area-P 1/2: 2.25 cm2
P 1/2 time: 654 msec
S' Lateral: 3.5 cm

## 2021-08-09 LAB — BASIC METABOLIC PANEL
BUN/Creatinine Ratio: 21 (ref 10–24)
BUN: 33 mg/dL — ABNORMAL HIGH (ref 8–27)
CO2: 24 mmol/L (ref 20–29)
Calcium: 9.8 mg/dL (ref 8.6–10.2)
Chloride: 104 mmol/L (ref 96–106)
Creatinine, Ser: 1.55 mg/dL — ABNORMAL HIGH (ref 0.76–1.27)
Glucose: 78 mg/dL (ref 70–99)
Potassium: 5 mmol/L (ref 3.5–5.2)
Sodium: 138 mmol/L (ref 134–144)
eGFR: 44 mL/min/{1.73_m2} — ABNORMAL LOW (ref >59)

## 2021-08-10 ENCOUNTER — Other Ambulatory Visit: Payer: Self-pay | Admitting: Cardiology

## 2021-08-10 DIAGNOSIS — I10 Essential (primary) hypertension: Secondary | ICD-10-CM

## 2021-09-26 ENCOUNTER — Other Ambulatory Visit: Payer: Self-pay | Admitting: Internal Medicine

## 2021-10-16 ENCOUNTER — Other Ambulatory Visit: Payer: Self-pay | Admitting: Internal Medicine

## 2021-11-05 ENCOUNTER — Encounter: Payer: Self-pay | Admitting: Internal Medicine

## 2021-11-05 ENCOUNTER — Ambulatory Visit (INDEPENDENT_AMBULATORY_CARE_PROVIDER_SITE_OTHER): Payer: Medicare Other | Admitting: Internal Medicine

## 2021-11-05 VITALS — BP 126/58 | HR 52 | Temp 97.6°F | Resp 18 | Ht 73.0 in | Wt 209.2 lb

## 2021-11-05 DIAGNOSIS — I10 Essential (primary) hypertension: Secondary | ICD-10-CM | POA: Diagnosis not present

## 2021-11-05 DIAGNOSIS — Z0000 Encounter for general adult medical examination without abnormal findings: Secondary | ICD-10-CM

## 2021-11-05 DIAGNOSIS — Z0001 Encounter for general adult medical examination with abnormal findings: Secondary | ICD-10-CM

## 2021-11-05 DIAGNOSIS — E785 Hyperlipidemia, unspecified: Secondary | ICD-10-CM

## 2021-11-05 DIAGNOSIS — E1140 Type 2 diabetes mellitus with diabetic neuropathy, unspecified: Secondary | ICD-10-CM

## 2021-11-05 DIAGNOSIS — E875 Hyperkalemia: Secondary | ICD-10-CM

## 2021-11-05 DIAGNOSIS — E559 Vitamin D deficiency, unspecified: Secondary | ICD-10-CM | POA: Diagnosis not present

## 2021-11-05 DIAGNOSIS — R5383 Other fatigue: Secondary | ICD-10-CM

## 2021-11-05 DIAGNOSIS — G629 Polyneuropathy, unspecified: Secondary | ICD-10-CM

## 2021-11-05 LAB — VITAMIN D 25 HYDROXY (VIT D DEFICIENCY, FRACTURES): VITD: 52.48 ng/mL (ref 30.00–100.00)

## 2021-11-05 LAB — CBC WITH DIFFERENTIAL/PLATELET
Basophils Absolute: 0 10*3/uL (ref 0.0–0.1)
Basophils Relative: 0.4 % (ref 0.0–3.0)
Eosinophils Absolute: 0.2 10*3/uL (ref 0.0–0.7)
Eosinophils Relative: 2.7 % (ref 0.0–5.0)
HCT: 41.9 % (ref 39.0–52.0)
Hemoglobin: 13.4 g/dL (ref 13.0–17.0)
Lymphocytes Relative: 15.6 % (ref 12.0–46.0)
Lymphs Abs: 1.3 10*3/uL (ref 0.7–4.0)
MCHC: 32 g/dL (ref 30.0–36.0)
MCV: 88.8 fl (ref 78.0–100.0)
Monocytes Absolute: 0.9 10*3/uL (ref 0.1–1.0)
Monocytes Relative: 11.1 % (ref 3.0–12.0)
Neutro Abs: 5.8 10*3/uL (ref 1.4–7.7)
Neutrophils Relative %: 70.2 % (ref 43.0–77.0)
Platelets: 219 10*3/uL (ref 150.0–400.0)
RBC: 4.72 Mil/uL (ref 4.22–5.81)
RDW: 14 % (ref 11.5–15.5)
WBC: 8.3 10*3/uL (ref 4.0–10.5)

## 2021-11-05 LAB — BASIC METABOLIC PANEL
BUN: 35 mg/dL — ABNORMAL HIGH (ref 6–23)
CO2: 27 mEq/L (ref 19–32)
Calcium: 9.3 mg/dL (ref 8.4–10.5)
Chloride: 103 mEq/L (ref 96–112)
Creatinine, Ser: 1.51 mg/dL — ABNORMAL HIGH (ref 0.40–1.50)
GFR: 41.88 mL/min — ABNORMAL LOW (ref >60.00)
Glucose, Bld: 79 mg/dL (ref 70–99)
Potassium: 5.4 mEq/L — ABNORMAL HIGH (ref 3.5–5.1)
Sodium: 138 mEq/L (ref 135–145)

## 2021-11-05 LAB — HEMOGLOBIN A1C: Hgb A1c MFr Bld: 6.7 % — ABNORMAL HIGH (ref 4.6–6.5)

## 2021-11-05 NOTE — Progress Notes (Signed)
Subjective:    Patient ID: William Faulkner, male    DOB: 1934-11-07, 87 y.o.   MRN: [id number]  DOS:  11/05/2021 Type of visit - description: CPX, here with his wife  In general feels well "about the same". Denies chest pain or palpitation Admits to feeling tired whenever he walks even short distances. Denies CP-DOE-SOP  >>  "just tired" Lower extremity edema has improved. Denies any recent gross hematuria Saw cardiology, note reviewed  Review of Systems  Other than above, a 14 point review of systems is negative    Past Medical History:  Diagnosis Date   AORTIC ANEUR UNSPEC SITE WITHOUT MENTION RUPTURE    AORTIC INSUFFICIENCY    BPH (benign prostatic hyperplasia)    CAD, ARTERY BYPASS GRAFT    Cancer (HCC)    CEREBROVASCULAR DISEASE    DIAB W/O COMP TYPE II/UNS NOT STATED UNCNTRL    Hyperlipidemia    HYPERTENSION, ESSENTIAL NOS    NEOPLASM, MALIGNANT, BLADDER, HX OF    Rotator cuff tear    Left shoulder,     Past Surgical History:  Procedure Laterality Date   BACK SURGERY  2003   lower   BLADDER TUMOR EXCISION     Surgery x6, Dr.Tannebaum   BLEPHAROPLASTY  1980   bilaterally, Dr Gershon Crane   COLONOSCOPY W/ POLYPECTOMY     Dr Ree Shay [facility]   CORONARY ARTERY BYPASS GRAFT  2011   x 5   CYSTOSCOPY  09/02/2012   INGUINAL HERNIA REPAIR  10/24/2011   Procedure: HERNIA REPAIR INGUINAL ADULT;  Surgeon: Earnstine Regal, MD;  Location: [facility];  Service: General;  Laterality: Right;  Repair Right Inguinal Hernia with Mesh, Umbilical Hernia Repair with Mesh   LUMBAR EPIDURAL INJECTION  2008   ROTATOR CUFF REPAIR  2009   right; Dr Shellia Carwin   SHOULDER ARTHROSCOPY WITH ROTATOR CUFF REPAIR AND SUBACROMIAL DECOMPRESSION  02/14/2014   Right, SAD/DCR, biceps tenodesis   UMBILICAL HERNIA REPAIR  10/24/2011   Procedure: HERNIA REPAIR UMBILICAL ADULT;  Surgeon: Earnstine Regal, MD;  Location: [facility];  Service: General;  Laterality: N/A;   Social History   Socioeconomic History    Marital status: Married    Spouse name: Not on file   Number of children: 3   Years of education: Not on file   Highest education level: Not on file  Occupational History   Occupation: retired  Tobacco Use   Smoking status: Former    Packs/day: 0.30    Years: 15.00    Total pack years: 4.50    Types: Cigarettes    Quit date: 01/22/1984    Years since quitting: 37.8   Smokeless tobacco: Never  Vaping Use   Vaping Use: Never used  Substance and Sexual Activity   Alcohol use: No    Alcohol/week: 0.0 standard drinks of alcohol    Comment: rare, wine    Drug use: No   Sexual activity: Not on file  Other Topics Concern   Not on file  Social History Narrative   8 G-children   Married x 64 years   Lives with wife in a one story home.  Has 3 children.     Retired from [employer].  Education: college.    Right handed   Drinks caffeine   One story home   Social Determinants of Health   Financial Resource Strain: Not on file  Food Insecurity: Not on file  Transportation Needs: Not on file  Physical Activity:  Not on file  Stress: Not on file  Social Connections: Not on file  Intimate Partner Violence: Not on file     Current Outpatient Medications  Medication Instructions   aspirin 81 mg, Oral, Daily,     benazepril (LOTENSIN) 10 mg, Oral, Daily, Due for appt 04/2021   cholecalciferol (VITAMIN D) 1,000 Units, Oral, Daily   [facility]S Vitamin B-12 5,000 mcg, Oral, Daily   gabapentin (NEURONTIN) 600 mg, Oral, 3 times daily   hydrALAZINE (APRESOLINE) 50 MG tablet TAKE 1 AND 1/2 TABLETS BY  MOUTH IN THE MORNING AND AT BEDTIME   hydrochlorothiazide (HYDRODIURIL) 25 mg, Oral, Daily   ketoconazole (NIZORAL) 2 % cream 1 application , Topical, Daily   metFORMIN (GLUCOPHAGE) 850 mg, Oral, 2 times daily with meals   propranolol (INDERAL) 40 MG tablet Take 40 mg daily   rosuvastatin (CRESTOR) 20 MG tablet TAKE 1 TABLET BY MOUTH  DAILY   timolol (TIMOPTIC) 0.5 % ophthalmic solution 1 drop, Every  morning       Objective:   Physical Exam BP (!) 126/58   Pulse (!) 52   Temp 97.6 F (36.4 C) (Oral)   Resp 18   Ht '6\' 1"'$  (1.854 m)   Wt 209 lb 4 oz (94.9 kg)   SpO2 99%   BMI 27.61 kg/m  General: Well developed, NAD, BMI noted Neck: No  thyromegaly  HEENT:  Normocephalic . Face symmetric, atraumatic Lungs:  CTA B Normal respiratory effort, no intercostal retractions, no accessory muscle use. Heart: RRR,  no murmur.  Abdomen:  Not distended, soft, non-tender.  Lower extremities: Trace edema Skin: Exposed areas without rash. Not pale. Not jaundice Neurologic:  alert & oriented X3.  Speech normal, gait appropriate for age and assist to the back cane Strength symmetric and appropriate for age.  Psych: Cognition and judgment appear intact.  Cooperative with normal attention span and concentration.  Behavior appropriate. No anxious or depressed appearing.     Assessment    Assessment Diabetes HTN Hyperlipidemia Pain management: *Neuropathy (painful type):  ---[id number]: Normal A83, folic acid, RPR. CK slightly elevated. NCS severe polyneuropathy ---Re-eval by neuro 06-2016: exam c/w neuropathy. Labs (-) ---intolerant  to lyrica ; intolerant to Elavil 04-2017 *MSK: h/o back surgery 2003 *Claudication:   Unremarkable ABIs 2017, back MRI 02-2016 (spinal stenosis not mentioned on the  report), saw Dr. Rita Ohara, symptoms not neurogenic.  Probably small vessel disease PVD. ABIs neg again 12-2016 CV: -CAD, CABG -Aortic insufficiency, dilated aortic root -Carotid artery disease 40-59% 10-2014, [place] 11-2015 : 1-39%, next per cards  -Ascending aortic aneurysm GU: BPH, bladder cancer Thyroid nodules: Small per [place]  03-2014 Cholelithiasis by CT 01-2014 Preglaucoma  PLAN: Here for CPX DM: Diet controlled, check A1c. HTN: Seems well controlled.  Since LOV, cardiology decrease benazepril to 10 mg and reduce propranolol to 40 mg (d/t h/o AV block).  Checking labs. CV, Saw  cardiology July 20, 2021 -CAD: was rec  to continue aspirin and statin for CAD. -Enlarge ascending aorta: CTA performed 07-2021, ascending aorta 4.6 cm.  Next 1 year -Aortic insufficiency: Echo done 07-2021, no change compared to previous. Fatigue: Very tired with exertion, no chest pain or difficulty breathing per se.  No edema on exam.  Likely multifactorial.  Checking labs today.  Observation Back pain: Chronic issue.  On conservative treatment. H/o bladder cancer: Had a episode of gross hematuria, saw urology 05/2020, declined at cystoscopy.  No further gross hematuria Neuropathy: Unchanged RTC 4 months

## 2021-11-05 NOTE — Assessment & Plan Note (Signed)
Td 2015 PNM 13: 2016.  PNM 23: 2017. PNM 20: 2023 Shingrix-RSV recommened  COVID vax: rec, remains reluctant  Had a flu shot CCS: Cscope not indictaed d/t age but rec to call if GI sxs  Prostate cancer screening: Sees urology regularly ;  [place] w/  [facility]  [phone]   Labs : BMP CBC A1c vitamin D Patient education: [place] discussed, info provided

## 2021-11-05 NOTE — Assessment & Plan Note (Signed)
Here for CPX DM: Diet controlled, check A1c. HTN: Seems well controlled.  Since LOV, cardiology decrease benazepril to 10 mg and reduce propranolol to 40 mg (d/t h/o AV block).  Checking labs. CV, Saw cardiology July 20, 2021 -CAD: was rec  to continue aspirin and statin for CAD. -Enlarge ascending aorta: CTA performed 07-2021, ascending aorta 4.6 cm.  Next 1 year -Aortic insufficiency: Echo done 07-2021, no change compared to previous. Fatigue: Very tired with exertion, no chest pain or difficulty breathing per se.  No edema on exam.  Likely multifactorial.  Checking labs today.  Observation Back pain: Chronic issue.  On conservative treatment. H/o bladder cancer: Had a episode of gross hematuria, saw urology 05/2020, declined at cystoscopy.  No further gross hematuria Neuropathy: Unchanged RTC 4 months

## 2021-11-05 NOTE — Patient Instructions (Addendum)
Vaccines I recommend:  Shingrix (shingles) Covid booster RSV vaccine  Check the  blood pressure regularly BP GOAL is between 110/65 and  135/85. If it is consistently higher or lower, let me know      GO TO THE LAB : Get the blood work     [place], [place] back for   a checkup in 4 months   Advanced care planning:  Do you have a "Living will", "[place] of attorney" ?   If you already have a living will or healthcare power of attorney, is recommended you bring the copy to be scanned in your chart. The document will be available to all the doctors you see in the system.  If you don't have one, please consider create one.    Fall Prevention in the Home, Adult Falls can cause injuries and affect people of all ages. There are many simple things that you can do to make your home safe and to help prevent falls. Ask for help when making these changes, if needed. What actions can I take to prevent falls? General instructions Use good lighting in all rooms. Replace any light bulbs that burn out, turn on lights if it is dark, and use night-lights. Place frequently used items in easy-to-reach places. Lower the shelves around your home if necessary. Set up furniture so that there are clear paths around it. Avoid moving your furniture around. Remove throw rugs and other tripping hazards from the floor. Avoid walking on wet floors. Fix any uneven floor surfaces. Add color or contrast paint or tape to grab bars and handrails in your home. Place contrasting color strips on the first and last steps of staircases. When you use a stepladder, make sure that it is completely opened and that the sides and supports are firmly locked. Have someone hold the ladder while you are using it. Do not climb a closed stepladder. Know where your pets are when moving through your home. What can I do in the bathroom?     Keep the floor dry. Immediately  clean up any water that is on the floor. Remove soap buildup in the tub or shower regularly. Use nonskid mats or decals on the floor of the tub or shower. Attach bath mats securely with double-sided, nonslip rug tape. If you need to sit down while you are in the shower, use a plastic, nonslip stool. Install grab bars by the toilet and in the tub and shower. Do not use towel bars as grab bars. What can I do in the bedroom? Make sure that a bedside light is easy to reach. Do not use oversized bedding that reaches the floor. Have a firm chair that has side arms to use for getting dressed. What can I do in the kitchen? Clean up any spills right away. If you need to reach for something above you, use a sturdy step stool that has a grab bar. Keep electrical cables out of the way. Do not use floor polish or wax that makes floors slippery. If you must use wax, make sure that it is non-skid floor wax. What can I do with my stairs? Do not leave any items on the stairs. Make sure that you have a light switch at the top and the bottom of the stairs. Have them installed if you do not have them. Make sure that there are handrails on both sides of the stairs. Fix handrails that are broken or  loose. Make sure that handrails are as long as the staircases. Install non-slip stair treads on all stairs in your home. Avoid having throw rugs at the top or bottom of stairs, or secure the rugs with carpet tape to prevent them from moving. Choose a carpet design that does not hide the edge of steps on the stairs. Check any carpeting to make sure that it is firmly attached to the stairs. Fix any carpet that is loose or worn. What can I do on the outside of my home? Use bright outdoor lighting. Regularly repair the edges of walkways and driveways and fix any cracks. Remove high doorway thresholds. Trim any shrubbery on the main path into your home. Regularly check that handrails are securely fastened and in good  repair. Both sides of all steps should have handrails. Install guardrails along the edges of any raised decks or porches. Clear walkways of debris and clutter, including tools and rocks. Have leaves, snow, and ice cleared regularly. Use sand or salt on walkways during winter months. In the garage, clean up any spills right away, including grease or oil spills. What other actions can I take? Wear closed-toe shoes that fit well and support your feet. Wear shoes that have rubber soles or low heels. Use mobility aids as needed, such as canes, walkers, scooters, and crutches. Review your medicines with your health care provider. Some medicines can cause dizziness or changes in blood pressure, which increase your risk of falling. Talk with your health care provider about other ways that you can decrease your risk of falls. This may include working with a physical therapist or trainer to improve your strength, balance, and endurance. Where to find more information Centers for Disease Control and Prevention, [employer]: http://www.wolf.info/ National Institute on Aging: http://kim-miller.com/ Contact a health care provider if: You are afraid of falling at home. You feel weak, drowsy, or dizzy at home. You fall at home. Summary There are many simple things that you can do to make your home safe and to help prevent falls. Ways to make your home safe include removing tripping hazards and installing grab bars in the bathroom. Ask for help when making these changes in your home. This information is not intended to replace advice given to you by your health care provider. Make sure you discuss any questions you have with your health care provider. Document Revised: 10/09/2020 Document Reviewed: 08/11/2019 Elsevier Patient Education  [place].

## 2021-11-07 NOTE — Addendum Note (Signed)
Addended byDamita Dunnings D on: 11/07/2021 02:04 PM   Modules accepted: Orders

## 2021-12-28 NOTE — Progress Notes (Signed)
HPI: FU CAD, aortic insufficiency, TAA and cerebrovascular disease. Cardiac catheterization in 5/11 revealed severe three-vessel coronary artery disease and normal LV function. He subsequently underwent coronary artery bypass and graft in May of 2011 (left internal mammary artery to left anterior descending artery, saphenous vein graft to first diagonal, saphenous vein graft obtuse marginal 1, sequential saphenous vein graft to distal right coronary and posterior descending artery). Note an intraoperative transesophageal echocardiogram showed mild to moderate aortic insufficiency and dilated aortic root. However this was felt not to require surgical intervention at that time. ABIs August 2017 normal. Carotid Dopplers November 2017 showed 1-39% bilateral stenosis. Abd ultrasound 12/17 showed >50 in proximal to mid aorta; no aneurysm. CTA 7/23 showed 4.6 cm ascending thoracic aortic aneurysm which was increased in size. Echocardiogram July 2023 showed normal LV function, mild left ventricular hypertrophy, grade 1 diastolic dysfunction, mild RV dysfunction, moderate aortic insufficiency, moderately dilated ascending aorta at 4.6 cm.  Since I last saw him, he denies increased dyspnea, chest pain or syncope.  He does have some dizziness with standing.  No pedal edema.  Current Outpatient Medications  Medication Sig Dispense Refill   aspirin 81 MG tablet Take 81 mg by mouth daily.     benazepril (LOTENSIN) 10 MG tablet Take 10 mg by mouth daily.     cholecalciferol (VITAMIN D) 1000 UNITS tablet Take 1,000 Units by mouth daily.     Cyanocobalamin (CVS VITAMIN B-12) 5000 MCG SUBL Take 5,000 mcg by mouth daily.     gabapentin (NEURONTIN) 600 MG tablet Take 1 tablet (600 mg total) by mouth 3 (three) times daily. 270 tablet 1   hydrALAZINE (APRESOLINE) 50 MG tablet TAKE 1 AND 1/2 TABLETS BY  MOUTH IN THE MORNING AND AT BEDTIME 270 tablet 3   hydrochlorothiazide (HYDRODIURIL) 25 MG tablet TAKE 1 TABLET BY  MOUTH DAILY 90 tablet 3   ketoconazole (NIZORAL) 2 % cream Apply 1 application topically daily. 30 g 0   metFORMIN (GLUCOPHAGE) 850 MG tablet TAKE 1 TABLET BY MOUTH TWICE DAILY WITH A MEAL 180 tablet 0   propranolol (INDERAL) 40 MG tablet Take 40 mg daily 90 tablet 3   rosuvastatin (CRESTOR) 20 MG tablet TAKE 1 TABLET BY MOUTH  DAILY 90 tablet 3   timolol (TIMOPTIC) 0.5 % ophthalmic solution Place 1 drop into both eyes every morning. As directed     No current facility-administered medications for this visit.     Past Medical History:  Diagnosis Date   AORTIC ANEUR UNSPEC SITE WITHOUT MENTION RUPTURE    AORTIC INSUFFICIENCY    BPH (benign prostatic hyperplasia)    CAD, ARTERY BYPASS GRAFT    Cancer ([employer])    CEREBROVASCULAR DISEASE    DIAB W/O COMP TYPE II/UNS NOT STATED UNCNTRL    Hyperlipidemia    HYPERTENSION, ESSENTIAL NOS    NEOPLASM, MALIGNANT, BLADDER, HX OF    Rotator cuff tear    Left shoulder,     Past Surgical History:  Procedure Laterality Date   BACK SURGERY  2003   lower   BLADDER TUMOR EXCISION     Surgery x6, Dr.Tannebaum   BLEPHAROPLASTY  1980   bilaterally, Dr Gershon Crane   COLONOSCOPY W/ POLYPECTOMY     Dr Ree Shay GI   CORONARY ARTERY BYPASS GRAFT  2011   x 5   CYSTOSCOPY  09/02/2012   INGUINAL HERNIA REPAIR  10/24/2011   Procedure: HERNIA REPAIR INGUINAL ADULT;  Surgeon: Earnstine Regal, MD;  Location:  [facility];  Service: General;  Laterality: Right;  Repair Right Inguinal Hernia with Mesh, Umbilical Hernia Repair with Mesh   LUMBAR EPIDURAL INJECTION  2008   ROTATOR CUFF REPAIR  2009   right; Dr Shellia Carwin   SHOULDER ARTHROSCOPY WITH ROTATOR CUFF REPAIR AND SUBACROMIAL DECOMPRESSION  02/14/2014   Right, SAD/DCR, biceps tenodesis   UMBILICAL HERNIA REPAIR  10/24/2011   Procedure: HERNIA REPAIR UMBILICAL ADULT;  Surgeon: Earnstine Regal, MD;  Location: [facility];  Service: General;  Laterality: N/A;    Social History   Socioeconomic History   Marital  status: Married    Spouse name: Not on file   Number of children: 3   Years of education: Not on file   Highest education level: Not on file  Occupational History   Occupation: retired  Tobacco Use   Smoking status: Former    Packs/day: 0.30    Years: 15.00    Total pack years: 4.50    Types: Cigarettes    Quit date: 01/22/1984    Years since quitting: 37.9   Smokeless tobacco: Never  Vaping Use   Vaping Use: Never used  Substance and Sexual Activity   Alcohol use: No    Alcohol/week: 0.0 standard drinks of alcohol    Comment: rare, wine    Drug use: No   Sexual activity: Not on file  Other Topics Concern   Not on file  Social History Narrative   8 G-children   Married x 64 years   Lives with wife in a one story home.  Has 3 children.     Retired from [employer].  Education: college.    Right handed   Drinks caffeine   One story home   Social Determinants of Health   Financial Resource Strain: Not on file  Food Insecurity: Not on file  Transportation Needs: Not on file  Physical Activity: Not on file  Stress: Not on file  Social Connections: Not on file  Intimate Partner Violence: Not on file    Family History  Problem Relation Age of Onset   Diabetes Mother    Tremor Father    Other Sister    Stroke Neg Hx    Heart disease Neg Hx    Cancer Neg Hx     ROS: General fatigue and arthralgias but no fevers or chills, productive cough, hemoptysis, dysphasia, odynophagia, melena, hematochezia, dysuria, hematuria, rash, seizure activity, orthopnea, PND, pedal edema, claudication. Remaining systems are negative.  Physical Exam: Well-developed well-nourished in no acute distress.  Skin is warm and dry.  HEENT is normal.  Neck is supple.  Chest is clear to auscultation with normal expansion.  Cardiovascular exam is regular rate and rhythm.  Abdominal exam nontender or distended. No masses palpated. Extremities show no edema. neuro grossly intact  A/P  1 coronary  artery disease-patient doing well with no chest pain.  Continue aspirin and statin.  2 thoracic aortic aneurysm-we will arrange follow-up CTA July 2024.  3 aortic insufficiency-plan follow-up echocardiogram July 2024.  Moderate on most recent study.  4 hypertension-patient's blood pressure is running low and he has some orthostatic symptoms.  Decrease hydralazine to 50 mg p.o. twice daily and follow.  Check potassium and renal function.  5 hyperlipidemia-continue statin.  Check lipids and liver.  6 carotid artery disease-mild on most recent study.  Kirk Ruths, MD

## 2021-12-31 ENCOUNTER — Encounter: Payer: Self-pay | Admitting: Internal Medicine

## 2022-01-08 ENCOUNTER — Ambulatory Visit: Payer: Medicare Other | Attending: Cardiology | Admitting: Cardiology

## 2022-01-08 ENCOUNTER — Encounter: Payer: Self-pay | Admitting: Cardiology

## 2022-01-08 VITALS — BP 112/62 | HR 70 | Ht 74.0 in | Wt 204.0 lb

## 2022-01-08 DIAGNOSIS — I2510 Atherosclerotic heart disease of native coronary artery without angina pectoris: Secondary | ICD-10-CM

## 2022-01-08 DIAGNOSIS — I359 Nonrheumatic aortic valve disorder, unspecified: Secondary | ICD-10-CM

## 2022-01-08 DIAGNOSIS — I712 Thoracic aortic aneurysm, without rupture, unspecified: Secondary | ICD-10-CM

## 2022-01-08 DIAGNOSIS — I10 Essential (primary) hypertension: Secondary | ICD-10-CM

## 2022-01-08 DIAGNOSIS — E785 Hyperlipidemia, unspecified: Secondary | ICD-10-CM | POA: Diagnosis not present

## 2022-01-08 LAB — COMPREHENSIVE METABOLIC PANEL
ALT: 22 IU/L (ref 0–44)
AST: 25 IU/L (ref 0–40)
Albumin/Globulin Ratio: 1.8 (ref 1.2–2.2)
Albumin: 4.2 g/dL (ref 3.7–4.7)
Alkaline Phosphatase: 66 IU/L (ref 44–121)
BUN/Creatinine Ratio: 19 (ref 10–24)
BUN: 33 mg/dL — ABNORMAL HIGH (ref 8–27)
Bilirubin Total: 0.3 mg/dL (ref 0.0–1.2)
CO2: 25 mmol/L (ref 20–29)
Calcium: 9.8 mg/dL (ref 8.6–10.2)
Chloride: 103 mmol/L (ref 96–106)
Creatinine, Ser: 1.74 mg/dL — ABNORMAL HIGH (ref 0.76–1.27)
Globulin, Total: 2.3 g/dL (ref 1.5–4.5)
Glucose: 112 mg/dL — ABNORMAL HIGH (ref 70–99)
Potassium: 4.9 mmol/L (ref 3.5–5.2)
Sodium: 140 mmol/L (ref 134–144)
Total Protein: 6.5 g/dL (ref 6.0–8.5)
eGFR: 38 mL/min/{1.73_m2} — ABNORMAL LOW (ref >59)

## 2022-01-08 LAB — LIPID PANEL
Chol/HDL Ratio: 3.7 ratio (ref 0.0–5.0)
Cholesterol, Total: 136 mg/dL (ref 100–199)
HDL: 37 mg/dL — ABNORMAL LOW (ref >39)
LDL Chol Calc (NIH): 68 mg/dL (ref 0–99)
Triglycerides: 183 mg/dL — ABNORMAL HIGH (ref 0–149)
VLDL Cholesterol Cal: 31 mg/dL (ref 5–40)

## 2022-01-08 MED ORDER — PROPRANOLOL HCL 40 MG PO TABS: ORAL_TABLET | ORAL | 3 refills | Status: DC

## 2022-01-08 MED ORDER — BENAZEPRIL HCL 10 MG PO TABS: 10.0000 mg | ORAL_TABLET | Freq: Every day | ORAL | 3 refills | Status: DC

## 2022-01-08 MED ORDER — HYDRALAZINE HCL 50 MG PO TABS: 50.0000 mg | ORAL_TABLET | Freq: Two times a day (BID) | ORAL | 3 refills | Status: DC

## 2022-01-08 NOTE — Patient Instructions (Signed)
Medication Instructions:   DECREASE HYDRALAZINE TO 50 MG TWICE DAILY  *If you need a refill on your cardiac medications before your next appointment, please call your pharmacy*   Follow-Up: At [facility], you and your health needs are our priority.  As part of our continuing mission to provide you with exceptional heart care, we have created designated Provider Care Teams.  These Care Teams include your primary Cardiologist (physician) and Advanced Practice Providers (APPs -  Physician Assistants and Nurse Practitioners) who all work together to provide you with the care you need, when you need it.  We recommend signing up for the patient portal called "MyChart".  Sign up information is provided on this After Visit Summary.  MyChart is used to connect with patients for Virtual Visits (Telemedicine).  Patients are able to view lab/test results, encounter notes, upcoming appointments, etc.  Non-urgent messages can be sent to your provider as well.   To learn more about what you can do with MyChart, go to [email or web address].    Your next appointment:   6 month(s)  The format for your next appointment:   In Person  Provider:   Kirk Ruths MD

## 2022-01-11 ENCOUNTER — Encounter: Payer: Self-pay | Admitting: *Deleted

## 2022-01-23 ENCOUNTER — Other Ambulatory Visit: Payer: Self-pay | Admitting: Cardiology

## 2022-01-23 ENCOUNTER — Telehealth: Payer: Self-pay | Admitting: Internal Medicine

## 2022-01-23 NOTE — Telephone Encounter (Signed)
William Faulkner (spouse) called asking if the BP check and Labs were necessary to schedule with Dr. Larose Kells as Dr. Stanford Breed with Cardio had performed a check and labs. Please Advise.

## 2022-01-23 NOTE — Telephone Encounter (Signed)
Please advise 

## 2022-01-24 NOTE — Telephone Encounter (Signed)
No need for labs. Please clarify: At the last visit I advised to stop benazepril, has he been taking it all along?  Just let me know.

## 2022-01-25 NOTE — Telephone Encounter (Signed)
Spoke w/ Pt's wife Dianah Field, she informed that Dr. Stanford Breed lowered Pt's benazepril to '10mg'$  back in December.

## 2022-01-25 NOTE — Telephone Encounter (Signed)
Okay. Thank you.

## 2022-05-02 ENCOUNTER — Encounter: Payer: Self-pay | Admitting: Internal Medicine

## 2022-05-29 LAB — HM DIABETES EYE EXAM

## 2022-06-14 ENCOUNTER — Other Ambulatory Visit: Payer: Self-pay | Admitting: Internal Medicine

## 2022-07-05 ENCOUNTER — Other Ambulatory Visit: Payer: Self-pay | Admitting: *Deleted

## 2022-07-05 DIAGNOSIS — I712 Thoracic aortic aneurysm, without rupture, unspecified: Secondary | ICD-10-CM

## 2022-07-05 NOTE — Progress Notes (Signed)
ctra

## 2022-07-08 ENCOUNTER — Telehealth: Payer: Self-pay | Admitting: Cardiology

## 2022-07-08 NOTE — Telephone Encounter (Signed)
Spoke with pt wife, aware he is due for CTA for his thoracic aneurysm. She will call to get CTA scheduled.

## 2022-07-08 NOTE — Telephone Encounter (Signed)
Wife is asking that you give her a call, she has some questions about the CT order. Please advise

## 2022-08-02 ENCOUNTER — Ambulatory Visit
Admission: RE | Admit: 2022-08-02 | Discharge: 2022-08-02 | Disposition: A | Discharge status: Home / Self Care | Payer: Medicare Other | Source: Ambulatory Visit | Attending: Cardiology | Admitting: Cardiology

## 2022-08-02 DIAGNOSIS — I712 Thoracic aortic aneurysm, without rupture, unspecified: Secondary | ICD-10-CM

## 2022-08-02 MED ORDER — IOPAMIDOL (ISOVUE-370) INJECTION 76%
75.0000 mL | Freq: Once | INTRAVENOUS | Status: AC | PRN
  Administered 2022-08-02: 75 mL via INTRAVENOUS

## 2022-08-07 ENCOUNTER — Encounter: Payer: Self-pay | Admitting: *Deleted

## 2022-08-31 ENCOUNTER — Other Ambulatory Visit: Payer: Self-pay | Admitting: Cardiology

## 2022-09-18 ENCOUNTER — Ambulatory Visit: Payer: Medicare Other | Admitting: Internal Medicine

## 2022-09-18 ENCOUNTER — Encounter: Payer: Self-pay | Admitting: Internal Medicine

## 2022-09-18 VITALS — BP 128/60 | HR 60 | Temp 98.4°F | Resp 16 | Ht 74.0 in | Wt 200.2 lb

## 2022-09-18 DIAGNOSIS — R21 Rash and other nonspecific skin eruption: Secondary | ICD-10-CM

## 2022-09-18 DIAGNOSIS — I10 Essential (primary) hypertension: Secondary | ICD-10-CM | POA: Diagnosis not present

## 2022-09-18 DIAGNOSIS — E1140 Type 2 diabetes mellitus with diabetic neuropathy, unspecified: Secondary | ICD-10-CM

## 2022-09-18 DIAGNOSIS — Z7984 Long term (current) use of oral hypoglycemic drugs: Secondary | ICD-10-CM

## 2022-09-18 DIAGNOSIS — M545 Low back pain, unspecified: Secondary | ICD-10-CM | POA: Diagnosis not present

## 2022-09-18 DIAGNOSIS — M542 Cervicalgia: Secondary | ICD-10-CM

## 2022-09-18 DIAGNOSIS — G8929 Other chronic pain: Secondary | ICD-10-CM

## 2022-09-18 LAB — BASIC METABOLIC PANEL
BUN: 31 mg/dL — ABNORMAL HIGH (ref 6–23)
CO2: 31 mEq/L (ref 19–32)
Calcium: 9.7 mg/dL (ref 8.4–10.5)
Chloride: 103 mEq/L (ref 96–112)
Creatinine, Ser: 1.55 mg/dL — ABNORMAL HIGH (ref 0.40–1.50)
GFR: 40.34 mL/min — ABNORMAL LOW (ref >60.00)
Glucose, Bld: 120 mg/dL — ABNORMAL HIGH (ref 70–99)
Potassium: 4.8 mEq/L (ref 3.5–5.1)
Sodium: 139 mEq/L (ref 135–145)

## 2022-09-18 LAB — CBC WITH DIFFERENTIAL/PLATELET
Basophils Absolute: 0 10*3/uL (ref 0.0–0.1)
Basophils Relative: 0.7 % (ref 0.0–3.0)
Eosinophils Absolute: 0.2 10*3/uL (ref 0.0–0.7)
Eosinophils Relative: 3.5 % (ref 0.0–5.0)
HCT: 42.1 % (ref 39.0–52.0)
Hemoglobin: 13.4 g/dL (ref 13.0–17.0)
Lymphocytes Relative: 22 % (ref 12.0–46.0)
Lymphs Abs: 1.4 10*3/uL (ref 0.7–4.0)
MCHC: 31.8 g/dL (ref 30.0–36.0)
MCV: 89.8 fl (ref 78.0–100.0)
Monocytes Absolute: 0.7 10*3/uL (ref 0.1–1.0)
Monocytes Relative: 10.1 % (ref 3.0–12.0)
Neutro Abs: 4.2 10*3/uL (ref 1.4–7.7)
Neutrophils Relative %: 63.7 % (ref 43.0–77.0)
Platelets: 218 10*3/uL (ref 150.0–400.0)
RBC: 4.69 Mil/uL (ref 4.22–5.81)
RDW: 13.8 % (ref 11.5–15.5)
WBC: 6.6 10*3/uL (ref 4.0–10.5)

## 2022-09-18 LAB — HEMOGLOBIN A1C: Hgb A1c MFr Bld: 6.5 % (ref 4.6–6.5)

## 2022-09-18 MED ORDER — BETAMETHASONE DIPROPIONATE AUG 0.05 % EX CREA
TOPICAL_CREAM | Freq: Two times a day (BID) | CUTANEOUS | 0 refills | Status: DC
Start: 2022-09-18 — End: 2023-07-02

## 2022-09-18 NOTE — Patient Instructions (Addendum)
For possible mosquito bites:  Start Allegra 60 mg 1 tablet twice a day as needed for itching Apply the ointment betamethasone twice a daily as needed See your dermatologist as you are planning Use long sleeves, long pants and apply repellent if you go outdoors.  Vaccines I recommend: Covid booster- new this fall Flu shot this fall Shingrix (shingles) RSV vaccine  See you in October, you have an appointment with me   Per our records you are due for your diabetic eye exam. Please contact your eye doctor to schedule an appointment. Please have them send copies of your office visit notes to [place]. Our fax number is [phone]. If you need a referral to an eye doctor please let us know.

## 2022-09-18 NOTE — Assessment & Plan Note (Signed)
Rash: No clear-cut explanation for his rash but  possibly related to mosquito bites . Recommend Allegra, Diprolene only as needed, avoid going outdoors very early in AM  or late.  Use long sleeves and long pants.  Repellent.  He has an appointment to see the dermatologist in 2 weeks and recommend to keep it. Chronic pain: Reports back and neck pain, triggered by walking, this has been an issue for several years, gradually getting worse.  No new symptoms. On chart review: ---*  h/o back surgery 2003 ---08-2015: Normal B12, folic acid, RPR. CK slightly elevated. NCS severe polyneuropathy ---Re-eval by neuro 06-2016: exam c/w neuropathy. Labs (-) ---intolerant  to lyrica ; intolerant to Elavil   ---Claudication:   Unremarkable ABIs 2017, back MRI 02-2016 (spinal stenosis not mentioned on the  report), saw Dr. Jule Ser, sxs not neurogenic.  Probably small vessel disease PVD. ABIs neg again 12-2016 In light of increasing symptoms we will ask orthopedics to reevaluate. Since he is here, I addressed some of his chronic medical problems. DM: On metformin, check A1c. HTN: BP looks very good, will get a BMP and CBC, continue Lotensin, hydralazine, HCTZ. Vaccines advice provided. RTC as scheduled for October

## 2022-09-18 NOTE — Progress Notes (Signed)
Subjective:    Patient ID: William Faulkner, male    DOB: 01-15-1935, 88 y.o.   MRN: [id number]  DOS:  09/18/2022 Type of visit - description: Acute, here for "mosquito bites"   Reports on and off skin lesions for the last 4 weeks. They are red, slightly raised, size is 4 to 5 mm, they dry up quickly. Extremely itchy.  Not painful. He thinks they could be mosquito bites although he has not seen mosquito per se. He does go outdoors frequently.  No fever or chills.  Also complains of ongoing back and neck pain, this is going on for years, gradually getting worse, depending more on his cane when he walks, denies bladder or bowel incontinence. No falls.  Since he is here and he has not been seen in almost a year I'll proceed with blood work for chronic conditions. Review of Systems See above   Past Medical History:  Diagnosis Date   AORTIC ANEUR UNSPEC SITE WITHOUT MENTION RUPTURE    AORTIC INSUFFICIENCY    BPH (benign prostatic hyperplasia)    CAD, ARTERY BYPASS GRAFT    Cancer (HCC)    CEREBROVASCULAR DISEASE    DIAB W/O COMP TYPE II/UNS NOT STATED UNCNTRL    Hyperlipidemia    HYPERTENSION, ESSENTIAL NOS    NEOPLASM, MALIGNANT, BLADDER, HX OF    Rotator cuff tear    Left shoulder,     Past Surgical History:  Procedure Laterality Date   BACK SURGERY  2003   lower   BLADDER TUMOR EXCISION     Surgery x6, Dr.Tannebaum   BLEPHAROPLASTY  1980   bilaterally, Dr Nile Riggs   COLONOSCOPY W/ POLYPECTOMY     Dr [facility]   CORONARY ARTERY BYPASS GRAFT  2011   x 5   CYSTOSCOPY  09/02/2012   INGUINAL HERNIA REPAIR  10/24/2011   Procedure: HERNIA REPAIR INGUINAL ADULT;  Surgeon: Velora Heckler, MD;  Location: [facility];  Service: General;  Laterality: Right;  Repair Right Inguinal Hernia with Mesh, Umbilical Hernia Repair with Mesh   LUMBAR EPIDURAL INJECTION  2008   ROTATOR CUFF REPAIR  2009   right; Dr Simonne Come   SHOULDER ARTHROSCOPY WITH ROTATOR CUFF REPAIR AND  SUBACROMIAL DECOMPRESSION  02/14/2014   Right, SAD/DCR, biceps tenodesis   UMBILICAL HERNIA REPAIR  10/24/2011   Procedure: HERNIA REPAIR UMBILICAL ADULT;  Surgeon: Velora Heckler, MD;  Location: [facility];  Service: General;  Laterality: N/A;    Current Outpatient Medications  Medication Instructions   aspirin 81 mg, Oral, Daily,     benazepril (LOTENSIN) 10 mg, Oral, Daily   cholecalciferol (VITAMIN D) 1,000 Units, Oral, Daily   [facility]S Vitamin B-12 5,000 mcg, Daily   gabapentin (NEURONTIN) 600 mg, Oral, 3 times daily   hydrALAZINE (APRESOLINE) 50 mg, Oral, 2 times daily   hydrochlorothiazide (HYDRODIURIL) 25 mg, Oral, Daily   ketoconazole (NIZORAL) 2 % cream 1 application , Topical, Daily   metFORMIN (GLUCOPHAGE) 850 mg, Oral, 2 times daily with meals   propranolol (INDERAL) 40 MG tablet Take 40 mg daily   rosuvastatin (CRESTOR) 20 MG tablet TAKE 1 TABLET BY MOUTH DAILY   timolol (TIMOPTIC) 0.5 % ophthalmic solution 1 drop, Every morning       Objective:   Physical Exam BP 128/60   Pulse 60   Temp 98.4 F (36.9 C) (Oral)   Resp 16   Ht 6\' 2"  (1.88 m)   Wt 200 lb 4 oz (90.8 kg)  SpO2 96%   BMI 25.71 kg/m  General:   Well developed, NAD, BMI noted. HEENT:  Normocephalic . Face symmetric, atraumatic Lungs:  CTA B Normal respiratory effort, no intercostal retractions, no accessory muscle use. Heart: RRR,  no murmur.  Lower extremities: no pretibial edema bilaterally  Skin: Scattered, very small, 2 to 3 mm in diameter, slightly elevated red  areas. Neurologic:  alert & oriented X3.  Speech normal, gait is slow, appropriate for age , assisted by a cane.   Psych--  Cognition and judgment appear intact.  Cooperative with normal attention span and concentration.  Behavior appropriate. No anxious or depressed appearing.      Assessment     Assessment Diabetes HTN Hyperlipidemia Pain management: *Neuropathy (painful type):  ---08-2015: Normal B12, folic acid, RPR. CK  slightly elevated. NCS severe polyneuropathy ---Re-eval by neuro 06-2016: exam c/w neuropathy. Labs (-) ---intolerant  to lyrica ; intolerant to Elavil 04-2017 *MSK: h/o back surgery 2003 *Claudication:   Unremarkable ABIs 2017, back MRI 02-2016 (spinal stenosis not mentioned on the  report), saw Dr. Jule Ser, symptoms not neurogenic.  Probably small vessel disease PVD. ABIs neg again 12-2016 CV: -CAD, CABG -Aortic insufficiency, dilated aortic root -Carotid artery disease 40-59% 10-2014, [place] 11-2015 : 1-39%, next per cards  -Ascending aortic aneurysm GU: BPH, bladder cancer Thyroid nodules: Small per [place]  03-2014 Cholelithiasis by CT 01-2014 Preglaucoma  PLAN: Rash: No clear-cut explanation for his rash but  possibly related to mosquito bites . Recommend Allegra, Diprolene only as needed, avoid going outdoors very early in AM  or late.  Use long sleeves and long pants.  Repellent.  He has an appointment to see the dermatologist in 2 weeks and recommend to keep it. Chronic pain: Reports back and neck pain, triggered by walking, this has been an issue for several years, gradually getting worse.  No new symptoms. On chart review: ---*  h/o back surgery 2003 ---08-2015: Normal B12, folic acid, RPR. CK slightly elevated. NCS severe polyneuropathy ---Re-eval by neuro 06-2016: exam c/w neuropathy. Labs (-) ---intolerant  to lyrica ; intolerant to Elavil   ---Claudication:   Unremarkable ABIs 2017, back MRI 02-2016 (spinal stenosis not mentioned on the  report), saw Dr. Jule Ser, sxs not neurogenic.  Probably small vessel disease PVD. ABIs neg again 12-2016 In light of increasing symptoms we will ask orthopedics to reevaluate. Since he is here, I addressed some of his chronic medical problems. DM: On metformin, check A1c. HTN: BP looks very good, will get a BMP and CBC, continue Lotensin, hydralazine, HCTZ. Vaccines advice provided. RTC as scheduled for October  Today I spent 40 minutes with the  patient, addressing nonacute new problem (rash), addressing chronic but getting worse issue of chronic pain, the patient had multiple questions that were answered to the best of my ability.  I also addressed diabetes and hypertension.

## 2022-09-27 ENCOUNTER — Other Ambulatory Visit: Payer: Self-pay | Admitting: Cardiology

## 2022-09-27 DIAGNOSIS — I10 Essential (primary) hypertension: Secondary | ICD-10-CM

## 2022-10-11 ENCOUNTER — Telehealth: Payer: Self-pay | Admitting: Cardiology

## 2022-10-11 NOTE — Telephone Encounter (Signed)
Patients wife calling to speak with Stanton Kidney, she states that she was supposed to hear back from her after her husbands CT but never heard from her. Offered to schedule her husband an appointment with Dr. [name] since he is overdue. Offered her [place], [place] and [place] locations but she declined. She only wants to speak with Stanton Kidney.

## 2022-10-11 NOTE — Telephone Encounter (Signed)
Spoke with pt wife, he is needing an appointment. Aware his ct scan is not due until July 2025. Follow up scheduled

## 2022-10-28 ENCOUNTER — Other Ambulatory Visit: Payer: Self-pay | Admitting: Internal Medicine

## 2022-11-19 ENCOUNTER — Encounter: Payer: Medicare Other | Admitting: Internal Medicine

## 2022-12-03 ENCOUNTER — Other Ambulatory Visit: Payer: Self-pay | Admitting: Cardiology

## 2022-12-03 DIAGNOSIS — I10 Essential (primary) hypertension: Secondary | ICD-10-CM

## 2022-12-24 ENCOUNTER — Other Ambulatory Visit: Payer: Self-pay | Admitting: Cardiology

## 2023-01-08 ENCOUNTER — Other Ambulatory Visit (HOSPITAL_COMMUNITY): Payer: Self-pay | Admitting: Urology

## 2023-01-08 DIAGNOSIS — C7951 Secondary malignant neoplasm of bone: Secondary | ICD-10-CM

## 2023-01-10 ENCOUNTER — Telehealth: Payer: Self-pay | Admitting: Cardiology

## 2023-01-10 NOTE — Telephone Encounter (Signed)
   Pre-operative Risk Assessment  Last visit: 01/08/2022 Next visit: 01/29/2023  Patient Name: William Faulkner  DOB: Jun 06, 1934 MRN: [id number]      Request for Surgical Clearance    Procedure:   TRUSP  biopsy  Date of Surgery:  Clearance TBD       02/20/23?    WAITING ON CORRECT DATE FROM REQUESTING PROVIDER'S OFFICE                       Surgeon:  DR. Cardell Peach Surgeon's Group or Practice Name:  [facility]  Phone number:  [phone] Fax number:  [phone]   Type of Clearance Requested:   - Pharmacy:  Hold Aspirin X 5 DAYS PRIOR    Type of Anesthesia:  Not Indicated   Additional requests/questions:    Signed, Royann Shivers   01/10/2023, 3:02 PM

## 2023-01-10 NOTE — Telephone Encounter (Signed)
   Name: MAMADI FERRARIS  DOB: 1934/04/18  MRN: [id number]  Primary Cardiologist: Olga Millers, MD  Chart reviewed as part of pre-operative protocol coverage. The patient has an upcoming visit scheduled with Dr. [name] on 01/26/2023 at which time clearance can be addressed in case there are any issues that would impact surgical recommendations.  I added preop FYI to appointment note so that provider is aware to address at time of outpatient visit.  Per office protocol the cardiology provider should forward their finalized clearance decision and recommendations regarding antiplatelet therapy to the requesting party below.     I will route this message as FYI to requesting party and remove this message from the preop box as separate preop APP input not needed at this time.   Please call with any questions.  Napoleon Form, Leodis Rains, NP  01/10/2023, 5:08 PM

## 2023-01-17 NOTE — Progress Notes (Deleted)
 HPI: FU CAD, aortic insufficiency, TAA and cerebrovascular disease. Cardiac catheterization in 5/11 revealed severe three-vessel coronary artery disease and normal LV function. He subsequently underwent coronary artery bypass and graft in May of 2011 (left internal mammary artery to left anterior descending artery, saphenous vein graft to first diagonal, saphenous vein graft obtuse marginal 1, sequential saphenous vein graft to distal right coronary and posterior descending artery). Note an intraoperative transesophageal echocardiogram showed mild to moderate aortic insufficiency and dilated aortic root. However this was felt not to require surgical intervention at that time. ABIs August 2017 normal. Carotid Dopplers November 2017 showed 1-39% bilateral stenosis. Abd ultrasound 12/17 showed >50 in proximal to mid aorta; no aneurysm. Echocardiogram July 2023 showed normal LV function, mild left ventricular hypertrophy, grade 1 diastolic dysfunction, mild RV dysfunction, moderate aortic insufficiency, moderately dilated ascending aorta at 4.6 cm.  CTA July 2024 showed thoracic aortic aneurysm at 4.2 cm.  Since I last saw him,    Current Outpatient Medications  Medication Sig Dispense Refill   aspirin 81 MG tablet Take 81 mg by mouth daily.     augmented betamethasone dipropionate (DIPROLENE-AF) 0.05 % cream Apply topically 2 (two) times daily. 60 g 0   benazepril (LOTENSIN) 10 MG tablet TAKE 1 TABLET BY MOUTH DAILY 90 tablet 0   cholecalciferol (VITAMIN D) 1000 UNITS tablet Take 1,000 Units by mouth daily.     Cyanocobalamin (CVS VITAMIN B-12) 5000 MCG SUBL Take 5,000 mcg by mouth daily. (Patient not taking: Reported on 09/18/2022)     gabapentin (NEURONTIN) 600 MG tablet TAKE 1 TABLET BY MOUTH 3 TIMES  DAILY 270 tablet 3   hydrALAZINE (APRESOLINE) 50 MG tablet TAKE 1 TABLET BY MOUTH TWICE  DAILY 180 tablet 0   hydrochlorothiazide (HYDRODIURIL) 25 MG tablet TAKE 1 TABLET BY MOUTH DAILY 90 tablet 3    ketoconazole (NIZORAL) 2 % cream Apply 1 application topically daily. 30 g 0   metFORMIN (GLUCOPHAGE) 850 MG tablet Take 1 tablet (850 mg total) by mouth 2 (two) times daily with a meal. 180 tablet 1   propranolol (INDERAL) 40 MG tablet TAKE 1 TABLET BY MOUTH DAILY 90 tablet 0   rosuvastatin (CRESTOR) 20 MG tablet TAKE 1 TABLET BY MOUTH DAILY 90 tablet 3   timolol (TIMOPTIC) 0.5 % ophthalmic solution Place 1 drop into both eyes every morning. As directed     No current facility-administered medications for this visit.     Past Medical History:  Diagnosis Date   AORTIC ANEUR UNSPEC SITE WITHOUT MENTION RUPTURE    AORTIC INSUFFICIENCY    BPH (benign prostatic hyperplasia)    CAD, ARTERY BYPASS GRAFT    Cancer (HCC)    CEREBROVASCULAR DISEASE    DIAB W/O COMP TYPE II/UNS NOT STATED UNCNTRL    Hyperlipidemia    HYPERTENSION, ESSENTIAL NOS    NEOPLASM, MALIGNANT, BLADDER, HX OF    Rotator cuff tear    Left shoulder,     Past Surgical History:  Procedure Laterality Date   BACK SURGERY  2003   lower   BLADDER TUMOR EXCISION     Surgery x6, Dr.Tannebaum   BLEPHAROPLASTY  1980   bilaterally, Dr Nile Riggs   COLONOSCOPY W/ POLYPECTOMY     Dr [facility]   CORONARY ARTERY BYPASS GRAFT  2011   x 5   CYSTOSCOPY  09/02/2012   INGUINAL HERNIA REPAIR  10/24/2011   Procedure: HERNIA REPAIR INGUINAL ADULT;  Surgeon: Velora Heckler, MD;  Location: [facility];  Service: General;  Laterality: Right;  Repair Right Inguinal Hernia with Mesh, Umbilical Hernia Repair with Mesh   LUMBAR EPIDURAL INJECTION  2008   ROTATOR CUFF REPAIR  2009   right; Dr Simonne Come   SHOULDER ARTHROSCOPY WITH ROTATOR CUFF REPAIR AND SUBACROMIAL DECOMPRESSION  02/14/2014   Right, SAD/DCR, biceps tenodesis   UMBILICAL HERNIA REPAIR  10/24/2011   Procedure: HERNIA REPAIR UMBILICAL ADULT;  Surgeon: Velora Heckler, MD;  Location: [facility];  Service: General;  Laterality: N/A;    Social History   Socioeconomic History    Marital status: Married    Spouse name: Not on file   Number of children: 3   Years of education: Not on file   Highest education level: Not on file  Occupational History   Occupation: retired  Tobacco Use   Smoking status: Former    Current packs/day: 0.00    Average packs/day: 0.3 packs/day for 15.0 years (4.5 ttl pk-yrs)    Types: Cigarettes    Start date: 01/21/1969    Quit date: 01/22/1984    Years since quitting: 39.0   Smokeless tobacco: Never  Vaping Use   Vaping status: Never Used  Substance and Sexual Activity   Alcohol use: No    Alcohol/week: 0.0 standard drinks of alcohol    Comment: rare, wine    Drug use: No   Sexual activity: Not on file  Other Topics Concern   Not on file  Social History Narrative   8 G-children   Married x 64 years   Lives with wife in a one story home.  Has 3 children.     Retired from [employer].  Education: college.    Right handed   Drinks caffeine   One story home   Social Drivers of Health   Financial Resource Strain: Not on file  Food Insecurity: Not on file  Transportation Needs: Not on file  Physical Activity: Not on file  Stress: Not on file  Social Connections: Not on file  Intimate Partner Violence: Not on file    Family History  Problem Relation Age of Onset   Diabetes Mother    Tremor Father    Other Sister    Stroke Neg Hx    Heart disease Neg Hx    Cancer Neg Hx     ROS: no fevers or chills, productive cough, hemoptysis, dysphasia, odynophagia, melena, hematochezia, dysuria, hematuria, rash, seizure activity, orthopnea, PND, pedal edema, claudication. Remaining systems are negative.  Physical Exam: Well-developed well-nourished in no acute distress.  Skin is warm and dry.  HEENT is normal.  Neck is supple.  Chest is clear to auscultation with normal expansion.  Cardiovascular exam is regular rate and rhythm.  Abdominal exam nontender or distended. No masses palpated. Extremities show no edema. neuro grossly  intact  ECG- personally reviewed  A/P  1 coronary artery disease-patient denies chest pain.  Continue aspirin and statin.  2 thoracic aortic aneurysm-plan follow-up CTA July 2025.  3 history of moderate aortic insufficiency-we will repeat echocardiogram.  4 hypertension-blood pressure controlled.  Continue present medications.  5 hyperlipidemia-continue statin.  6 carotid artery disease-mild on most recent Dopplers.  Olga Millers, MD

## 2023-01-18 ENCOUNTER — Encounter (HOSPITAL_COMMUNITY): Payer: Self-pay

## 2023-01-18 ENCOUNTER — Inpatient Hospital Stay (HOSPITAL_COMMUNITY)
Admission: EM | Admit: 2023-01-18 | Discharge: 2023-01-28 | DRG: 477 | Disposition: A | Discharge status: Skilled Nursing Facility | Payer: Medicare Other | Attending: Internal Medicine | Admitting: Internal Medicine

## 2023-01-18 ENCOUNTER — Observation Stay (HOSPITAL_COMMUNITY): Payer: Medicare Other

## 2023-01-18 ENCOUNTER — Other Ambulatory Visit: Payer: Self-pay

## 2023-01-18 ENCOUNTER — Emergency Department (HOSPITAL_COMMUNITY): Payer: Medicare Other

## 2023-01-18 DIAGNOSIS — S22080A Wedge compression fracture of T11-T12 vertebra, initial encounter for closed fracture: Secondary | ICD-10-CM | POA: Diagnosis not present

## 2023-01-18 DIAGNOSIS — E785 Hyperlipidemia, unspecified: Secondary | ICD-10-CM | POA: Diagnosis present

## 2023-01-18 DIAGNOSIS — Z6823 Body mass index (BMI) 23.0-23.9, adult: Secondary | ICD-10-CM

## 2023-01-18 DIAGNOSIS — E43 Unspecified severe protein-calorie malnutrition: Secondary | ICD-10-CM | POA: Insufficient documentation

## 2023-01-18 DIAGNOSIS — I679 Cerebrovascular disease, unspecified: Secondary | ICD-10-CM | POA: Diagnosis present

## 2023-01-18 DIAGNOSIS — C61 Malignant neoplasm of prostate: Secondary | ICD-10-CM | POA: Insufficient documentation

## 2023-01-18 DIAGNOSIS — Z515 Encounter for palliative care: Secondary | ICD-10-CM

## 2023-01-18 DIAGNOSIS — Z794 Long term (current) use of insulin: Secondary | ICD-10-CM

## 2023-01-18 DIAGNOSIS — R972 Elevated prostate specific antigen [PSA]: Secondary | ICD-10-CM

## 2023-01-18 DIAGNOSIS — N1831 Chronic kidney disease, stage 3a: Secondary | ICD-10-CM | POA: Diagnosis present

## 2023-01-18 DIAGNOSIS — M8458XA Pathological fracture in neoplastic disease, other specified site, initial encounter for fracture: Secondary | ICD-10-CM | POA: Diagnosis present

## 2023-01-18 DIAGNOSIS — I129 Hypertensive chronic kidney disease with stage 1 through stage 4 chronic kidney disease, or unspecified chronic kidney disease: Secondary | ICD-10-CM | POA: Diagnosis present

## 2023-01-18 DIAGNOSIS — Z8551 Personal history of malignant neoplasm of bladder: Secondary | ICD-10-CM

## 2023-01-18 DIAGNOSIS — E1122 Type 2 diabetes mellitus with diabetic chronic kidney disease: Secondary | ICD-10-CM | POA: Diagnosis present

## 2023-01-18 DIAGNOSIS — Z833 Family history of diabetes mellitus: Secondary | ICD-10-CM

## 2023-01-18 DIAGNOSIS — I10 Essential (primary) hypertension: Secondary | ICD-10-CM | POA: Diagnosis present

## 2023-01-18 DIAGNOSIS — K8020 Calculus of gallbladder without cholecystitis without obstruction: Secondary | ICD-10-CM | POA: Diagnosis present

## 2023-01-18 DIAGNOSIS — G893 Neoplasm related pain (acute) (chronic): Secondary | ICD-10-CM | POA: Diagnosis present

## 2023-01-18 DIAGNOSIS — E1142 Type 2 diabetes mellitus with diabetic polyneuropathy: Secondary | ICD-10-CM | POA: Diagnosis present

## 2023-01-18 DIAGNOSIS — K5900 Constipation, unspecified: Secondary | ICD-10-CM | POA: Diagnosis present

## 2023-01-18 DIAGNOSIS — Z951 Presence of aortocoronary bypass graft: Secondary | ICD-10-CM

## 2023-01-18 DIAGNOSIS — Z7189 Other specified counseling: Secondary | ICD-10-CM

## 2023-01-18 DIAGNOSIS — Z7982 Long term (current) use of aspirin: Secondary | ICD-10-CM

## 2023-01-18 DIAGNOSIS — Z882 Allergy status to sulfonamides status: Secondary | ICD-10-CM

## 2023-01-18 DIAGNOSIS — E119 Type 2 diabetes mellitus without complications: Secondary | ICD-10-CM

## 2023-01-18 DIAGNOSIS — Z888 Allergy status to other drugs, medicaments and biological substances status: Secondary | ICD-10-CM

## 2023-01-18 DIAGNOSIS — G250 Essential tremor: Secondary | ICD-10-CM | POA: Diagnosis present

## 2023-01-18 DIAGNOSIS — C7951 Secondary malignant neoplasm of bone: Principal | ICD-10-CM | POA: Diagnosis present

## 2023-01-18 DIAGNOSIS — H409 Unspecified glaucoma: Secondary | ICD-10-CM | POA: Diagnosis present

## 2023-01-18 DIAGNOSIS — Z79899 Other long term (current) drug therapy: Secondary | ICD-10-CM

## 2023-01-18 DIAGNOSIS — N179 Acute kidney failure, unspecified: Secondary | ICD-10-CM | POA: Diagnosis present

## 2023-01-18 DIAGNOSIS — Z751 Person awaiting admission to adequate facility elsewhere: Secondary | ICD-10-CM

## 2023-01-18 DIAGNOSIS — I061 Rheumatic aortic insufficiency: Secondary | ICD-10-CM | POA: Diagnosis present

## 2023-01-18 DIAGNOSIS — E860 Dehydration: Secondary | ICD-10-CM | POA: Diagnosis present

## 2023-01-18 DIAGNOSIS — M549 Dorsalgia, unspecified: Secondary | ICD-10-CM | POA: Diagnosis not present

## 2023-01-18 DIAGNOSIS — Z7984 Long term (current) use of oral hypoglycemic drugs: Secondary | ICD-10-CM

## 2023-01-18 DIAGNOSIS — Z66 Do not resuscitate: Secondary | ICD-10-CM | POA: Diagnosis not present

## 2023-01-18 DIAGNOSIS — N183 Chronic kidney disease, stage 3 unspecified: Secondary | ICD-10-CM | POA: Diagnosis present

## 2023-01-18 DIAGNOSIS — I700 Atherosclerosis of aorta: Secondary | ICD-10-CM | POA: Diagnosis present

## 2023-01-18 DIAGNOSIS — Z87891 Personal history of nicotine dependence: Secondary | ICD-10-CM

## 2023-01-18 DIAGNOSIS — Z8679 Personal history of other diseases of the circulatory system: Secondary | ICD-10-CM

## 2023-01-18 DIAGNOSIS — M5134 Other intervertebral disc degeneration, thoracic region: Secondary | ICD-10-CM | POA: Insufficient documentation

## 2023-01-18 DIAGNOSIS — R54 Age-related physical debility: Secondary | ICD-10-CM | POA: Diagnosis present

## 2023-01-18 DIAGNOSIS — M5124 Other intervertebral disc displacement, thoracic region: Secondary | ICD-10-CM | POA: Diagnosis present

## 2023-01-18 DIAGNOSIS — I2510 Atherosclerotic heart disease of native coronary artery without angina pectoris: Secondary | ICD-10-CM | POA: Diagnosis present

## 2023-01-18 DIAGNOSIS — G9341 Metabolic encephalopathy: Secondary | ICD-10-CM | POA: Diagnosis present

## 2023-01-18 DIAGNOSIS — N400 Enlarged prostate without lower urinary tract symptoms: Secondary | ICD-10-CM | POA: Diagnosis present

## 2023-01-18 DIAGNOSIS — Z8673 Personal history of transient ischemic attack (TIA), and cerebral infarction without residual deficits: Secondary | ICD-10-CM

## 2023-01-18 DIAGNOSIS — I719 Aortic aneurysm of unspecified site, without rupture: Secondary | ICD-10-CM | POA: Diagnosis present

## 2023-01-18 LAB — URINALYSIS, W/ REFLEX TO CULTURE (INFECTION SUSPECTED)
Bacteria, UA: NONE SEEN
Bilirubin Urine: NEGATIVE
Glucose, UA: NEGATIVE mg/dL
Ketones, ur: 5 mg/dL — AB
Leukocytes,Ua: NEGATIVE
Nitrite: NEGATIVE
Protein, ur: 30 mg/dL — AB
Specific Gravity, Urine: 1.017 (ref 1.005–1.030)
pH: 5 (ref 5.0–8.0)

## 2023-01-18 LAB — CBC WITH DIFFERENTIAL/PLATELET
Abs Immature Granulocytes: 0.01 10*3/uL (ref 0.00–0.07)
Basophils Absolute: 0 10*3/uL (ref 0.0–0.1)
Basophils Relative: 0 %
Eosinophils Absolute: 0.1 10*3/uL (ref 0.0–0.5)
Eosinophils Relative: 1 %
HCT: 46 % (ref 39.0–52.0)
Hemoglobin: 14.4 g/dL (ref 13.0–17.0)
Immature Granulocytes: 0 %
Lymphocytes Relative: 15 %
Lymphs Abs: 1.1 10*3/uL (ref 0.7–4.0)
MCH: 28.5 pg (ref 26.0–34.0)
MCHC: 31.3 g/dL (ref 30.0–36.0)
MCV: 91.1 fL (ref 80.0–100.0)
Monocytes Absolute: 0.5 10*3/uL (ref 0.1–1.0)
Monocytes Relative: 7 %
Neutro Abs: 5.9 10*3/uL (ref 1.7–7.7)
Neutrophils Relative %: 77 %
Platelets: 299 10*3/uL (ref 150–400)
RBC: 5.05 MIL/uL (ref 4.22–5.81)
RDW: 13.2 % (ref 11.5–15.5)
WBC: 7.6 10*3/uL (ref 4.0–10.5)
nRBC: 0 % (ref 0.0–0.2)

## 2023-01-18 LAB — COMPREHENSIVE METABOLIC PANEL
ALT: 16 U/L (ref 0–44)
AST: 22 U/L (ref 15–41)
Albumin: 3.6 g/dL (ref 3.5–5.0)
Alkaline Phosphatase: 85 U/L (ref 38–126)
Anion gap: 13 (ref 5–15)
BUN: 38 mg/dL — ABNORMAL HIGH (ref 8–23)
CO2: 21 mmol/L — ABNORMAL LOW (ref 22–32)
Calcium: 10 mg/dL (ref 8.9–10.3)
Chloride: 105 mmol/L (ref 98–111)
Creatinine, Ser: 1.47 mg/dL — ABNORMAL HIGH (ref 0.61–1.24)
GFR, Estimated: 46 mL/min — ABNORMAL LOW (ref >60)
Glucose, Bld: 128 mg/dL — ABNORMAL HIGH (ref 70–99)
Potassium: 4.3 mmol/L (ref 3.5–5.1)
Sodium: 139 mmol/L (ref 135–145)
Total Bilirubin: 0.9 mg/dL (ref <1.2)
Total Protein: 7.6 g/dL (ref 6.5–8.1)

## 2023-01-18 LAB — LIPASE, BLOOD: Lipase: 28 U/L (ref 11–51)

## 2023-01-18 LAB — GLUCOSE, CAPILLARY: Glucose-Capillary: 166 mg/dL — ABNORMAL HIGH (ref 70–99)

## 2023-01-18 MED ORDER — IOHEXOL 350 MG/ML SOLN
80.0000 mL | Freq: Once | INTRAVENOUS | Status: AC | PRN
  Administered 2023-01-18: 75 mL via INTRAVENOUS

## 2023-01-18 MED ORDER — TIMOLOL MALEATE 0.5 % OP SOLN
1.0000 [drp] | Freq: Every morning | OPHTHALMIC | Status: DC
  Administered 2023-01-19 – 2023-01-28 (×10): 1 [drp] via OPHTHALMIC
  Filled 2023-01-18: qty 5

## 2023-01-18 MED ORDER — OXYCODONE-ACETAMINOPHEN 5-325 MG PO TABS
1.0000 | ORAL_TABLET | Freq: Three times a day (TID) | ORAL | Status: DC | PRN
  Administered 2023-01-18 – 2023-01-20 (×2): 1 via ORAL
  Filled 2023-01-18 (×2): qty 1

## 2023-01-18 MED ORDER — ONDANSETRON HCL 4 MG/2ML IJ SOLN
4.0000 mg | Freq: Four times a day (QID) | INTRAMUSCULAR | Status: DC | PRN
  Administered 2023-01-18 – 2023-01-19 (×2): 4 mg via INTRAVENOUS
  Filled 2023-01-18 (×2): qty 2

## 2023-01-18 MED ORDER — VITAMIN D 25 MCG (1000 UNIT) PO TABS
1000.0000 [IU] | ORAL_TABLET | Freq: Every day | ORAL | Status: DC
  Administered 2023-01-18 – 2023-01-28 (×10): 1000 [IU] via ORAL
  Filled 2023-01-18 (×11): qty 1

## 2023-01-18 MED ORDER — IOHEXOL 350 MG/ML SOLN: 100.0000 mL | Freq: Once | INTRAVENOUS | Status: DC | PRN

## 2023-01-18 MED ORDER — HYDROMORPHONE HCL 1 MG/ML IJ SOLN
1.0000 mg | Freq: Once | INTRAMUSCULAR | Status: AC
  Administered 2023-01-18: 1 mg via INTRAVENOUS
  Filled 2023-01-18 (×2): qty 1

## 2023-01-18 MED ORDER — ONDANSETRON HCL 4 MG/2ML IJ SOLN
4.0000 mg | Freq: Once | INTRAMUSCULAR | Status: AC
  Administered 2023-01-18: 4 mg via INTRAVENOUS
  Filled 2023-01-18: qty 2

## 2023-01-18 MED ORDER — BENAZEPRIL HCL 5 MG PO TABS
10.0000 mg | ORAL_TABLET | Freq: Every day | ORAL | Status: DC
  Administered 2023-01-18 – 2023-01-19 (×2): 10 mg via ORAL
  Filled 2023-01-18 (×2): qty 2

## 2023-01-18 MED ORDER — METFORMIN HCL 850 MG PO TABS: 850.0000 mg | ORAL_TABLET | Freq: Two times a day (BID) | ORAL | Status: DC

## 2023-01-18 MED ORDER — ROSUVASTATIN CALCIUM 20 MG PO TABS
20.0000 mg | ORAL_TABLET | Freq: Every day | ORAL | Status: DC
  Administered 2023-01-18 – 2023-01-28 (×11): 20 mg via ORAL
  Filled 2023-01-18 (×12): qty 1

## 2023-01-18 MED ORDER — HYDROCHLOROTHIAZIDE 12.5 MG PO TABS
12.0000 mg | ORAL_TABLET | Freq: Every day | ORAL | Status: DC
  Administered 2023-01-18 – 2023-01-19 (×2): 12.5 mg via ORAL
  Filled 2023-01-18 (×2): qty 1

## 2023-01-18 MED ORDER — NYSTATIN 100000 UNIT/GM EX POWD
Freq: Three times a day (TID) | CUTANEOUS | Status: DC
  Administered 2023-01-26: 1 via TOPICAL
  Filled 2023-01-18: qty 15

## 2023-01-18 MED ORDER — ACETAMINOPHEN 325 MG PO TABS: 650.0000 mg | ORAL_TABLET | Freq: Four times a day (QID) | ORAL | Status: DC | PRN

## 2023-01-18 MED ORDER — ASPIRIN 81 MG PO TBEC
81.0000 mg | DELAYED_RELEASE_TABLET | Freq: Every day | ORAL | Status: DC
  Administered 2023-01-18 – 2023-01-28 (×10): 81 mg via ORAL
  Filled 2023-01-18 (×11): qty 1

## 2023-01-18 MED ORDER — GABAPENTIN 300 MG PO CAPS
600.0000 mg | ORAL_CAPSULE | Freq: Two times a day (BID) | ORAL | Status: DC
  Administered 2023-01-18 – 2023-01-19 (×3): 600 mg via ORAL
  Filled 2023-01-18 (×3): qty 2

## 2023-01-18 MED ORDER — HYDROMORPHONE HCL 1 MG/ML IJ SOLN
0.5000 mg | INTRAMUSCULAR | Status: DC | PRN
  Administered 2023-01-18: 0.5 mg via INTRAVENOUS
  Filled 2023-01-18: qty 0.5

## 2023-01-18 MED ORDER — MORPHINE SULFATE (PF) 4 MG/ML IV SOLN
4.0000 mg | Freq: Once | INTRAVENOUS | Status: AC
  Administered 2023-01-18: 4 mg via INTRAVENOUS
  Filled 2023-01-18: qty 1

## 2023-01-18 MED ORDER — MAGNESIUM HYDROXIDE 400 MG/5ML PO SUSP: 15.0000 mL | Freq: Every day | ORAL | Status: DC | PRN

## 2023-01-18 MED ORDER — ONDANSETRON HCL 4 MG PO TABS
4.0000 mg | ORAL_TABLET | Freq: Four times a day (QID) | ORAL | Status: DC | PRN
Start: 2023-01-18 — End: 2023-01-29

## 2023-01-18 MED ORDER — NALOXONE HCL 0.4 MG/ML IJ SOLN: 0.4000 mg | INTRAMUSCULAR | Status: DC | PRN

## 2023-01-18 MED ORDER — POLYETHYLENE GLYCOL 3350 17 G PO PACK
17.0000 g | PACK | Freq: Every day | ORAL | Status: DC
Start: 2023-01-18 — End: 2023-01-29
  Administered 2023-01-18 – 2023-01-28 (×6): 17 g via ORAL
  Filled 2023-01-18 (×10): qty 1

## 2023-01-18 MED ORDER — ENOXAPARIN SODIUM 40 MG/0.4ML IJ SOSY
40.0000 mg | PREFILLED_SYRINGE | INTRAMUSCULAR | Status: DC
Start: 2023-01-18 — End: 2023-01-20
  Administered 2023-01-19: 40 mg via SUBCUTANEOUS
  Filled 2023-01-18 (×2): qty 0.4

## 2023-01-18 MED ORDER — PROPRANOLOL HCL 20 MG PO TABS
40.0000 mg | ORAL_TABLET | Freq: Every day | ORAL | Status: DC
  Administered 2023-01-18 – 2023-01-20 (×3): 40 mg via ORAL
  Filled 2023-01-18 (×4): qty 2

## 2023-01-18 MED ORDER — ACETAMINOPHEN 650 MG RE SUPP: 650.0000 mg | Freq: Four times a day (QID) | RECTAL | Status: DC | PRN

## 2023-01-18 NOTE — H&P (Signed)
History and Physical    Patient: William Faulkner JXB:[id number] DOB: 1935/01/18 DOA: 01/18/2023 DOS: the patient was seen and examined on 01/18/2023 PCP: Wanda Plump, MD  Patient coming from: Home  Chief Complaint:  Chief Complaint  Patient presents with   Back Pain   Nausea   Weakness   HPI: William Faulkner is a 88 y.o. male with medical history significant of benign essential tremor, BPH, cholelithiasis, type 2 diabetes, CAD/CABG, aortic aneurysm, arctic insufficiency, hyperlipidemia, hypertension, left rotator cuff tear, diabetic peripheral neuropathy bladder cancer, cerebrovascular disease, stage 3a CKD who presented with history of 2 months of back pain, nausea and weakness.  He was seen by orthopedic surgery last month, was sent for an MRI which showed multiple areas of metastatic disease to the bone but no clear primary identified.  When asked about activities he may have hyperextended his back, the patient said that it is definitely a possibility.  No fecal or urinary incontinence.  No radiation of pain to lower extremities. He denied fever, chills, rhinorrhea, sore throat, wheezing or hemoptysis.  No chest pain, palpitations, diaphoresis, PND, orthopnea or pitting edema of the lower extremities.  No abdominal pain, nausea, emesis, diarrhea, melena or hematochezia, but gets frequently constipated.  No flank pain, dysuria, frequency or hematuria.  No polyuria, polydipsia, polyphagia or blurred vision.   ED course: Initial vital signs were temperature 97.7 F, pulse 76, respirations 16, BP 162/70 mmHg O2 sat 98% on room air.  Patient received morphine 4 mg IVP x 1, ondansetron 4 mg IVP x 1 and hydromorphone 1 mg IVP.  Lab work: His urine analysis with moderate hemoglobin, ketones of 5 and protein of 30 mg/deciliter.  CBC was normal.  Unremarkable lipase.  CMP showed a glucose 128, BUN 38 and creatinine 1.47 mg/deciliter.  CO2 was 21 mmol/L, the rest of the electrolytes and LFTs were  normal.  Imaging: CTA abdomen and pelvis was negative for aortic dissection.  Stable mild fusiform aneurysmal enlargement of the ascending aorta tach.  Severe aortic atherosclerosis.  Positive for subacute and pathologic appearing T12 compression fracture, with underlying lytic lesion and loss of height progressed since November.  No retropulsion or other complicating features.   Review of Systems: As mentioned in the history of present illness. All other systems reviewed and are negative. Past Medical History:  Diagnosis Date   AORTIC ANEUR UNSPEC SITE WITHOUT MENTION RUPTURE    AORTIC INSUFFICIENCY    BPH (benign prostatic hyperplasia)    CAD, ARTERY BYPASS GRAFT    Cancer (HCC)    CEREBROVASCULAR DISEASE    DIAB W/O COMP TYPE II/UNS NOT STATED UNCNTRL    Hyperlipidemia    HYPERTENSION, ESSENTIAL NOS    NEOPLASM, MALIGNANT, BLADDER, HX OF    Rotator cuff tear    Left shoulder,    Past Surgical History:  Procedure Laterality Date   BACK SURGERY  2003   lower   BLADDER TUMOR EXCISION     Surgery x6, Dr.Tannebaum   BLEPHAROPLASTY  1980   bilaterally, Dr Nile Riggs   COLONOSCOPY W/ POLYPECTOMY     Dr Aw[facility]   CORONARY ARTERY BYPASS GRAFT  2011   x 5   CYSTOSCOPY  09/02/2012   INGUINAL HERNIA REPAIR  10/24/2011   Procedure: HERNIA REPAIR INGUINAL ADULT;  Surgeon: Velora Heckler, MD;  Location: [facility];  Service: General;  Laterality: Right;  Repair Right Inguinal Hernia with Mesh, Umbilical Hernia Repair with Mesh   LUMBAR EPIDURAL INJECTION  2008   ROTATOR CUFF REPAIR  2009   right; Dr Simonne Come   SHOULDER ARTHROSCOPY WITH ROTATOR CUFF REPAIR AND SUBACROMIAL DECOMPRESSION  02/14/2014   Right, SAD/DCR, biceps tenodesis   UMBILICAL HERNIA REPAIR  10/24/2011   Procedure: HERNIA REPAIR UMBILICAL ADULT;  Surgeon: Velora Heckler, MD;  Location: [facility];  Service: General;  Laterality: N/A;   Social History:  reports that he quit smoking about 39 years ago. His smoking use included  cigarettes. He started smoking about 54 years ago. He has a 4.5 pack-year smoking history. He has never used smokeless tobacco. He reports that he does not drink alcohol and does not use drugs.  Allergies  Allergen Reactions   Atorvastatin Other (See Comments)    REACTION: increased cpk   Celecoxib Other (See Comments)    REACTION: palpitations= celebrex   Sulfonamide Derivatives Other (See Comments)    Unknown   Zolpidem Tartrate Other (See Comments)    REACTION: made patient feel weird= ambien    Family History  Problem Relation Age of Onset   Diabetes Mother    Tremor Father    Other Sister    Stroke Neg Hx    Heart disease Neg Hx    Cancer Neg Hx     Prior to Admission medications   Medication Sig Start Date End Date Taking? Authorizing Provider  aspirin 81 MG tablet Take 81 mg by mouth daily.    [provider]  augmented betamethasone dipropionate (DIPROLENE-AF) 0.05 % cream Apply topically 2 (two) times daily. 09/18/22   Wanda Plump, MD  benazepril (LOTENSIN) 10 MG tablet TAKE 1 TABLET BY MOUTH DAILY 12/26/22   Lewayne Bunting, MD  cholecalciferol (VITAMIN D) 1000 UNITS tablet Take 1,000 Units by mouth daily.    [provider]  Cyanocobalamin ([employer] VITAMIN B-12) 5000 MCG SUBL Take 5,000 mcg by mouth daily. Patient not taking: Reported on 09/18/2022    [provider]  gabapentin (NEURONTIN) 600 MG tablet TAKE 1 TABLET BY MOUTH 3 TIMES  DAILY 10/28/22   Worthy Rancher B, FNP  hydrALAZINE (APRESOLINE) 50 MG tablet TAKE 1 TABLET BY MOUTH TWICE  DAILY 12/04/22   Lewayne Bunting, MD  hydrochlorothiazide (HYDRODIURIL) 25 MG tablet TAKE 1 TABLET BY MOUTH DAILY 09/02/22   Lewayne Bunting, MD  ketoconazole (NIZORAL) 2 % cream Apply 1 application topically daily. 07/19/19   Wanda Plump, MD  metFORMIN (GLUCOPHAGE) 850 MG tablet Take 1 tablet (850 mg total) by mouth 2 (two) times daily with a meal. 06/14/22   Wanda Plump, MD  propranolol (INDERAL) 40 MG tablet  TAKE 1 TABLET BY MOUTH DAILY 12/26/22   Lewayne Bunting, MD  rosuvastatin (CRESTOR) 20 MG tablet TAKE 1 TABLET BY MOUTH DAILY 01/24/22   Lewayne Bunting, MD  timolol (TIMOPTIC) 0.5 % ophthalmic solution Place 1 drop into both eyes every morning. As directed 02/13/11   [provider]    Physical Exam: Vitals:   01/18/23 0830 01/18/23 1100 01/18/23 1115 01/18/23 1200  BP: (!) 143/83 (!) 159/71 (!) 158/59 (!) 155/61  Pulse:  96 90 87  Resp: 18 18 16 16   Temp:  98.2 F (36.8 C)    TempSrc:  Oral    SpO2: 98% 95% 96% 96%  Weight:      Height:       Physical Exam Vitals and nursing note reviewed.  Constitutional:      General: He is awake.  Appearance: Normal appearance. He is ill-appearing.  HENT:     Head: Normocephalic.     Mouth/Throat:     Mouth: Mucous membranes are moist.  Eyes:     General: No scleral icterus.    Pupils: Pupils are equal, round, and reactive to light.  Cardiovascular:     Rate and Rhythm: Normal rate and regular rhythm.  Pulmonary:     Effort: Pulmonary effort is normal.     Breath sounds: Normal breath sounds.  Abdominal:     General: Bowel sounds are normal.     Palpations: Abdomen is soft.     Tenderness: There is no abdominal tenderness.  Musculoskeletal:     Cervical back: Neck supple.     Right lower leg: No edema.     Left lower leg: No edema.  Skin:    General: Skin is warm and dry.     Findings: Bruising present.  Neurological:     General: No focal deficit present.     Mental Status: He is alert and oriented to person, place, and time.  Psychiatric:        Mood and Affect: Mood normal.        Behavior: Behavior normal. Behavior is cooperative.     Data Reviewed:  Results are pending, will review when available.  Assessment and Plan: Principal Problem:   T12 compression fracture, initial encounter (HCC) Observation/MedSurg. Analgesics as needed. Antiemetics as needed. Consult PT and OT.. Orthopedic surgery to  evaluate. -MRI requested by then has been done. -MRI report still pending.  Active Problems:   Metastasis to bone [facility]) Already is scheduled for evaluation as outpatient. Has prior history of bladder cancer.    Diabetes and neuropathy Carbohydrate modified diet. CBG monitoring with RI SS. Check hemoglobin A1c. Continue metformin 850 mg p.o. twice daily. Continue gabapentin 600 mg p.o. 2 times daily.    Hyperlipidemia Continue rosuvastatin 20 mg p.o. daily.    Essential hypertension Continue benazepril 10 mg p.o. daily. Continue, but decrease HCTZ to 12.5 mg p.o. daily. Continue propranolol 40 mg p.o. daily    Cholelithiasis Asymptomatic at this time.    Benign essential tremor On propranolol as above.    Glaucoma Continue timolol drops. Follow-up with ophthalmology as an outpatient.   Advance Care Planning:   Code Status: Full Code   Consults:   Family Communication: His wife, son and daughter were at bedside.  Severity of Illness: The appropriate patient status for this patient is OBSERVATION. Observation status is judged to be reasonable and necessary in order to provide the required intensity of service to ensure the patient's safety. The patient's presenting symptoms, physical exam findings, and initial radiographic and laboratory data in the context of their medical condition is felt to place them at decreased risk for further clinical deterioration. Furthermore, it is anticipated that the patient will be medically stable for discharge from the hospital within 2 midnights of admission.   Author: Bobette Mo, MD 01/18/2023 12:12 PM  For on call review [email or web address].   This document was prepared using Dragon voice recognition software and may contain some unintended transcription errors.

## 2023-01-18 NOTE — ED Notes (Signed)
Ortho called to put TLSO brace.

## 2023-01-18 NOTE — ED Triage Notes (Signed)
Pt BIB EMS coming from home c/o chronic pain to his back, hips and abd x 2 months. States weakness started a month ago. N/V started 5 days ago. Pain 10/10.   BP 162/70, HR 86, SPO2 96%, RR 18, CBG 132

## 2023-01-18 NOTE — ED Notes (Signed)
Patient transported to CT 

## 2023-01-18 NOTE — ED Provider Notes (Signed)
Emergency Department Provider Note   I have reviewed the triage vital signs and the nursing notes.   HISTORY  Chief Complaint Back Pain, Nausea, and Weakness   HPI William Faulkner is a 88 y.o. male with past history reviewed below including remote history of bladder neoplasm presents to the emergency department with severe lower back pain.  Patient is followed by [facility] for his back pain and went for an MRI at some point in the past 2 months according to family.  He had multiple areas of likely metastatic disease to the bone but no clear primary cancer identified.  He has been referred to his urologist and an oncologist but has yet to have those appointments.  He is not experiencing any numbness or severe weakness but pain is significantly limiting his activities to the point where he is now unable to stand/walk due to pain.   Past Medical History:  Diagnosis Date   AORTIC ANEUR UNSPEC SITE WITHOUT MENTION RUPTURE    AORTIC INSUFFICIENCY    BPH (benign prostatic hyperplasia)    CAD, ARTERY BYPASS GRAFT    Cancer (HCC)    CEREBROVASCULAR DISEASE    DIAB W/O COMP TYPE II/UNS NOT STATED UNCNTRL    Hyperlipidemia    HYPERTENSION, ESSENTIAL NOS    NEOPLASM, MALIGNANT, BLADDER, HX OF    Rotator cuff tear    Left shoulder,     Review of Systems  Constitutional: No fever/chills Cardiovascular: Denies chest pain. Respiratory: Denies shortness of breath. Gastrointestinal: No abdominal pain.  No nausea, no vomiting.  No diarrhea.  No constipation. Genitourinary: Negative for dysuria. Musculoskeletal: Positive for back pain. Skin: Negative for rash. Neurological: Negative for headaches.  ____________________________________________   PHYSICAL EXAM:  VITAL SIGNS: ED Triage Vitals  Encounter Vitals Group     BP 01/18/23 0713 (!) 162/70     Pulse Rate 01/18/23 0713 76     Resp 01/18/23 0713 16     Temp 01/18/23 0713 97.7 F (36.5 C)     Temp Source 01/18/23 0713  Oral     SpO2 01/18/23 0713 98 %     Weight 01/18/23 0720 184 lb (83.5 kg)     Height 01/18/23 0720 6\' 2"  (1.88 m)   Constitutional: Alert and oriented. Appears very uncomfortable with any movement.  Eyes: Conjunctivae are normal.  Head: Atraumatic. Nose: No congestion/rhinnorhea. Mouth/Throat: Mucous membranes are moist.  Neck: No stridor.   Cardiovascular: Normal rate, regular rhythm. Good peripheral circulation. Grossly normal heart sounds.   Respiratory: Normal respiratory effort.  No retractions. Lungs CTAB. Gastrointestinal: Soft and nontender. No distention.  Musculoskeletal: No lower extremity tenderness nor edema. No gross deformities of extremities. Neurologic:  Normal speech and language.  Normal deep tendon reflexes in the bilateral lower extremities.  Normal sensation.  Pain with flexion/extension of the hip but strength preserved. Skin:  Skin is warm, dry and intact. No rash noted.  ____________________________________________   LABS (all labs ordered are listed, but only abnormal results are displayed)  Labs Reviewed  COMPREHENSIVE METABOLIC PANEL - Abnormal; Notable for the following components:      Result Value   CO2 21 (*)    Glucose, Bld 128 (*)    BUN 38 (*)    Creatinine, Ser 1.47 (*)    GFR, Estimated 46 (*)    All other components within normal limits  URINALYSIS, W/ REFLEX TO CULTURE (INFECTION SUSPECTED) - Abnormal; Notable for the following components:   Hgb urine dipstick MODERATE (*)  Ketones, ur 5 (*)    Protein, ur 30 (*)    All other components within normal limits  LIPASE, BLOOD  CBC WITH DIFFERENTIAL/PLATELET   ____________________________________________  RADIOLOGY  CT Angio Chest/Abd/Pel for Dissection W and/or Wo Contrast Result Date: 01/18/2023 CLINICAL DATA:  88 year old male with pain, weakness. Known mild aneurysmal dilatation of the ascending thoracic aorta. Bladder cancer. EXAM: CT ANGIOGRAPHY CHEST, ABDOMEN AND PELVIS  TECHNIQUE: Non-contrast CT of the chest was initially obtained. Multidetector CT imaging through the chest, abdomen and pelvis was performed using the standard protocol during bolus administration of intravenous contrast. Multiplanar reconstructed images and MIPs were obtained and reviewed to evaluate the vascular anatomy. RADIATION DOSE REDUCTION: This exam was performed according to the departmental dose-optimization program which includes automated exposure control, adjustment of the mA and/or kV according to patient size and/or use of iterative reconstruction technique. CONTRAST:  75mL OMNIPAQUE IOHEXOL 350 MG/ML SOLN COMPARISON:  CTA chest 08/02/2022 and earlier. Restaging CT Abdomen and Pelvis 12/11/2022 FINDINGS: CTA CHEST FINDINGS Cardiovascular: Previous CABG. Extensive Calcified aortic atherosclerosis. Tortuous ascending aorta is stable, smoothly tapers in the arch. Negative for thoracic aortic dissection, periaortic hematoma. Heart size is stable, within normal limits. No pericardial effusion. Little contrast in the pulmonary arteries. Proximal great vessel atherosclerosis appears stable since July. Mediastinum/Nodes: Negative for mediastinal hematoma, mass, lymphadenopathy. Lungs/Pleura: Stable lung volumes since July. Patchy lung base atelectasis or scarring has not significantly changed. No pleural effusion. Small volume new retained secretions in the trachea just below the thoracic inlet on series 4 image 30. But elsewhere the major airways are patent. No active pulmonary inflammation is identified. Musculoskeletal: Chronic sternotomy. Chronic right 2nd rib deformity. T1 through T11 thoracic vertebrae appear stable since July. T12 compression fracture with mild comminution is new since July but was present, subtle in November when other skeletal metastatic disease was suspected. Central T12 loss of vertebral body height up to 30% (series 6, image 105). Lucency within the T12 body appears progressed  since July. No retropulsion. T12 pedicles and posterior elements appear intact and aligned. There is mild anterior and lateral prevertebral soft tissue or edema which is increased since November on series 11, image 56. No epidural tumor by CT. Review of the MIP images confirms the above findings. CTA ABDOMEN AND PELVIS FINDINGS VASCULAR Aortoiliac calcified atherosclerosis. Extensive aortic and branch calcified atherosclerosis but the major arterial structures in the abdomen and pelvis remain patent. Negative for abdominal aortic dissection or aneurysm. Review of the MIP images confirms the above findings. NON-VASCULAR Hepatobiliary: Stable liver. Cholelithiasis. No gallbladder inflammation by CT. Pancreas: Diminutive. Spleen: Diminutive. Adrenals/Urinary Tract: Normal adrenal glands. Nonobstructed kidneys with symmetric renal enhancement. No hydroureter. Abnormal prostate at the base of the bladder as below. Diminutive bladder similar to the November CT. Stomach/Bowel: Decompressed large bowel from the splenic flexure distally but transverse colon is redundant with moderate retained gas and stool, increased since November. Low-density retained stool in the right colon. Cecum is on a lax mesentery. No dilated small bowel. No large bowel inflammation identified. Stomach and duodenum appear negative. No free air or free fluid. Lymphatic: No lymphadenopathy identified in the abdomen or pelvis. Reproductive: Abnormally heterogeneous, lobulated, masslike appearance of the prostate on series 10, image 203 not significantly changed from the restaging CT last month (please see that report) Other: No pelvis free fluid. Musculoskeletal: Lumbar vertebrae appear stable since November. Medial left iliac sclerotic bone lesions have not significantly changed. Sacrum and SI joints appear intact. Pelvis and proximal  femurs appear to remain intact. Review of the MIP images confirms the above findings. IMPRESSION: 1. Negative for  Aortic Dissection. Stable mild fusiform aneurysmal enlargement of the ascending aorta. Severe Aortic Atherosclerosis (ICD10-I70.0). 2. Positive for a subacute and pathologic appearing T12 compression fracture, with underlying lytic lesion and loss of height progressed since November. No retropulsion or other complicating features. In conjunction with November CT findings of skeletal metastatic disease, abnormal bladder base and prostate as detailed on that exam. 3. No other acute finding in the Chest, Abdomen, or Pelvis. Cholelithiasis. Electronically Signed   By: Odessa Fleming M.D.   On: 01/18/2023 10:37    ____________________________________________   PROCEDURES  Procedure(s) performed:   Procedures  None  ____________________________________________   INITIAL IMPRESSION / ASSESSMENT AND PLAN / ED COURSE  Pertinent labs & imaging results that were available during my care of the patient were reviewed by me and considered in my medical decision making (see chart for details).   This patient is Presenting for Evaluation of back pain, which does require a range of treatment options, and is a complaint that involves a high risk of morbidity and mortality.  The Differential Diagnoses includes but is not exclusive to musculoskeletal back pain, renal colic, urinary tract infection, pyelonephritis, intra-abdominal causes of back pain, aortic aneurysm or dissection, cauda equina syndrome, sciatica, lumbar disc disease, thoracic disc disease, etc.   Critical Interventions-    Medications  rosuvastatin (CRESTOR) tablet 20 mg (20 mg Oral Given 01/18/23 1450)  timolol (TIMOPTIC) 0.5 % ophthalmic solution 1 drop (has no administration in time range)  propranolol (INDERAL) tablet 40 mg (40 mg Oral Given 01/18/23 1450)  hydrochlorothiazide (HYDRODIURIL) tablet 12.5 mg (12.5 mg Oral Given 01/18/23 1450)  benazepril (LOTENSIN) tablet 10 mg (10 mg Oral Given 01/18/23 1517)  gabapentin (NEURONTIN) capsule  600 mg (has no administration in time range)  cholecalciferol (VITAMIN D3) 25 MCG (1000 UNIT) tablet 1,000 Units (1,000 Units Oral Given 01/18/23 1450)  aspirin EC tablet 81 mg (81 mg Oral Given 01/18/23 1450)  metFORMIN (GLUCOPHAGE) tablet 850 mg (has no administration in time range)  oxyCODONE-acetaminophen (PERCOCET/ROXICET) 5-325 MG per tablet 1 tablet (has no administration in time range)  enoxaparin (LOVENOX) injection 40 mg (has no administration in time range)  acetaminophen (TYLENOL) tablet 650 mg (has no administration in time range)    Or  acetaminophen (TYLENOL) suppository 650 mg (has no administration in time range)  ondansetron (ZOFRAN) tablet 4 mg (has no administration in time range)    Or  ondansetron (ZOFRAN) injection 4 mg (has no administration in time range)  HYDROmorphone (DILAUDID) injection 0.5 mg (0.5 mg Intravenous Given 01/18/23 1420)  naloxone (NARCAN) injection 0.4 mg (has no administration in time range)  polyethylene glycol (MIRALAX / GLYCOLAX) packet 17 g (17 g Oral Given 01/18/23 1539)  magnesium hydroxide (MILK OF MAGNESIA) suspension 15 mL (has no administration in time range)  morphine (PF) 4 MG/ML injection 4 mg (4 mg Intravenous Given 01/18/23 0817)  ondansetron (ZOFRAN) injection 4 mg (4 mg Intravenous Given 01/18/23 0817)  iohexol (OMNIPAQUE) 350 MG/ML injection 80 mL (75 mLs Intravenous Contrast Given 01/18/23 0959)  HYDROmorphone (DILAUDID) injection 1 mg (1 mg Intravenous Given 01/18/23 1054)    Reassessment after intervention: pain slightly improved but requiring increased doses of medication.    I did obtain Additional Historical Information from daughter and wife at beside.   Clinical Laboratory Tests Ordered, included UA without infection.  Creatinine near baseline at 1.47.  No  leukocytosis.  LFTs and bilirubin/lipase are normal.  Radiologic Tests Ordered, included CTA dissection. I independently interpreted the images and agree with  radiology interpretation.   Cardiac Monitor Tracing which shows NSR.    Social Determinants of Health Risk patient is a non-smoker.   Consult complete with [facility]. Plan for repeat MRI of thoracic spine. Patient may be a candidate for kyphoplasty pending repeat MRI. Can get MRI thoracic spine here and reach out to Dr. Shon Baton.   TRH. Dr. Robb Matar. Plan for admit.   Medical Decision Making: Summary:  Patient presents to the emergency department with severe back pain and known metastatic disease.  MRI performed recently.  Patient also with known aortic aneurysm.  Plan for dissection scan and reassess.  Reevaluation with update and discussion with patient.  He is requiring increased doses of pain medication now requiring IV Dilaudid to be more comfortable.  CT shows no change in his aneurysm but T12 pathologic fracture.  Discussed with Ortho with plan as above.  TLSO bracing and MRI planned. Will admit for pain control.    Patient's presentation is most consistent with acute presentation with potential threat to life or bodily function.   Disposition: admit  ____________________________________________  FINAL CLINICAL IMPRESSION(S) / ED DIAGNOSES  Final diagnoses:  Compression fracture of T12 vertebra, initial encounter [facility])    Note:  This document was prepared using Dragon voice recognition software and may include unintentional dictation errors.  Alona Bene, MD, [facility] Emergency Medicine    Anh Bigos, Arlyss Repress, MD 01/18/23 1556

## 2023-01-18 NOTE — Progress Notes (Signed)
Orthopedic Tech Progress Note Patient Details:  Nicholad Abrigo [facility] September 03, 1934 [id number]  Ortho Devices Type of Ortho Device: Thoracolumbar corset (TLSO) Ortho Device/Splint Interventions: Ordered, Application, Adjustment   Post Interventions Patient Tolerated: Well Instructions Provided: Adjustment of device, Care of device  Tonye Pearson 01/18/2023, 12:44 PM

## 2023-01-19 DIAGNOSIS — K8020 Calculus of gallbladder without cholecystitis without obstruction: Secondary | ICD-10-CM

## 2023-01-19 DIAGNOSIS — I10 Essential (primary) hypertension: Secondary | ICD-10-CM | POA: Diagnosis not present

## 2023-01-19 DIAGNOSIS — R972 Elevated prostate specific antigen [PSA]: Secondary | ICD-10-CM | POA: Diagnosis not present

## 2023-01-19 DIAGNOSIS — C7951 Secondary malignant neoplasm of bone: Secondary | ICD-10-CM | POA: Diagnosis not present

## 2023-01-19 DIAGNOSIS — S22080A Wedge compression fracture of T11-T12 vertebra, initial encounter for closed fracture: Secondary | ICD-10-CM | POA: Diagnosis not present

## 2023-01-19 DIAGNOSIS — M5134 Other intervertebral disc degeneration, thoracic region: Secondary | ICD-10-CM | POA: Insufficient documentation

## 2023-01-19 DIAGNOSIS — E785 Hyperlipidemia, unspecified: Secondary | ICD-10-CM

## 2023-01-19 LAB — GLUCOSE, CAPILLARY
Glucose-Capillary: 151 mg/dL — ABNORMAL HIGH (ref 70–99)
Glucose-Capillary: 218 mg/dL — ABNORMAL HIGH (ref 70–99)
Glucose-Capillary: 136 mg/dL — ABNORMAL HIGH (ref 70–99)
Glucose-Capillary: 126 mg/dL — ABNORMAL HIGH (ref 70–99)

## 2023-01-19 MED ORDER — SODIUM CHLORIDE 0.9 % IV BOLUS
500.0000 mL | Freq: Once | INTRAVENOUS | Status: AC
  Administered 2023-01-19: 500 mL via INTRAVENOUS

## 2023-01-19 MED ORDER — INSULIN ASPART 100 UNIT/ML IJ SOLN
0.0000 [IU] | Freq: Three times a day (TID) | INTRAMUSCULAR | Status: DC
  Administered 2023-01-21 (×2): 1 [IU] via SUBCUTANEOUS
  Administered 2023-01-22: 2 [IU] via SUBCUTANEOUS
  Administered 2023-01-23 – 2023-01-25 (×4): 1 [IU] via SUBCUTANEOUS
  Administered 2023-01-25: 3 [IU] via SUBCUTANEOUS
  Administered 2023-01-26 – 2023-01-28 (×6): 1 [IU] via SUBCUTANEOUS
  Administered 2023-01-28: 2 [IU] via SUBCUTANEOUS

## 2023-01-19 NOTE — Progress Notes (Signed)
PROGRESS NOTE  William Faulkner KGM:[id number] DOB: 13-Aug-1934   PCP: Wanda Plump, MD  Patient is from: Home.  Lives with his wife.  Uses cane at times.  DOA: 01/18/2023 LOS: 0  Chief complaints Chief Complaint  Patient presents with   Back Pain   Nausea   Weakness     Brief Narrative / Interim history: 88 year old M with PMH of CAD/CABG, thoracic cord aneurysm, aortic insufficiency, CKD-3A, CVA, HTN, HLD, bladder cancer and recent diagnosis of vertebral metastasis for which she is followed by orthopedic surgery, presenting with progressive back pain for about 2 months.  No report of trauma or fall.  In ED, CT angio chest, abdomen and pelvis raise concern for subacute and pathologic T12 compression fracture without retropulsion or other complicating features, underlying lytic lesion and progressive loss of height, abnormal bladder base and prostate .  [facility], Dr. Shon Baton consulted by EDP.  MRI thoracic spine obtained and showed multiple metastatic deposits throughout the thoracic spine, central disc protrusion at T8-9 without canal or foraminal stenosis, rightward disc protrusion and annular tear at T11-12 without focal stenosis but no pathologic fractures.  Subjective: Seen and examined this afternoon after multiple attempts.  Patient was with therapy initially and then talking to his wife when I went for the second time.  Now with his son at bedside.  Reports improvement in his pain.  He rates his pain 2-3 on a scale of 10.  Denies focal weakness, numbness or tingling.  Denies bowel or bladder habit change.  Discussed CT and MRI finding with patient and patient's son at bedside.  Objective: Vitals:   01/18/23 2101 01/19/23 0133 01/19/23 0630 01/19/23 0957  BP: (!) 141/56 138/64 132/63 (!) 108/56  Pulse: 80 82 92 74  Resp: 15 17 16 16   Temp: 97.9 F (36.6 C) 98.6 F (37 C) 98.2 F (36.8 C) 98 F (36.7 C)  TempSrc: Oral Oral Oral   SpO2: 95% 93% 94% 95%  Weight:       Height:        Examination:  GENERAL: No apparent distress.  Nontoxic. HEENT: MMM.  Vision and hearing grossly intact.  NECK: Supple.  No apparent JVD.  RESP:  No IWOB.  Fair aeration bilaterally. CVS:  RRR. Heart sounds normal.  ABD/GI/GU: BS+. Abd soft, NTND.  MSK/EXT:  Moves extremities. No apparent deformity. No edema.  TLSO brace in place. SKIN: no apparent skin lesion or wound NEURO: Awake, alert and oriented appropriately.  No apparent focal neuro deficit. PSYCH: Calm. Normal affect.   Procedures:  None  Microbiology summarized: None  Assessment and plan: Acute on chronic back pain: CT raise concern for pathologic T12 compression fracture.  However, MRI ruled out compression fracture but concerning for central disc protrusion at T8-9 and right Ward disc protrusion and abnormality at T11-12 without focal stenosis or complicating feature.  Patient has no focal neurosymptoms or bowel or bladder habit change.  Pain improved.  It seems [facility] was consulted while in ED. -Requested formal consult and discussed with Dr. Aundria Rud.  Per Dr. Aundria Rud, Dr. 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  No pathologic fracture.  Unknown primary.  Patient has history of bladder cancer.  CT shows abnormal bladder base and prostate. -May benefit from IR biopsy to identify primary source  History of CAD/CABG: No cardiopulmonary symptoms.  Stable -Continue home meds  DM-2 with hyperglycemia, neuropathy, hyperlipidemia and CKD-3A: A1c 6.5% on 8/28. Recent Labs  Lab 01/18/23 2102 01/19/23 0748 01/19/23 1119  GLUCAP 166* 151* 218*  -Continue home metformin -Start SSI-sensitive -Continue home gabapentin and Crestor -Follow hemoglobin A1c  CKD-3A: Stable Recent Labs     09/18/22 1102 01/18/23 0822  BUN 31* 38*  CREATININE 1.55* 1.47*  -Continue monitoring   Essential hypertension: Soft blood pressure this morning.  Not symptomatic. -Hold Lotensin and HCTZ.  Received morning dose -Continue holding hydralazine -Continue propranolol for now     Benign essential tremor -Continue propranolol as above.   Glaucoma -Continue timolol drops. -Follow-up with ophthalmology as an outpatient.   Body mass index is 23.62 kg/m.         DVT prophylaxis:  enoxaparin (LOVENOX) injection 40 mg Start: 01/18/23 1200  Code Status: Full code Family Communication: Updated patient's son at bedside Level of care: Med-Surg Status is: Observation The patient will require care spanning > 2 midnights and should be moved to inpatient because: Acute pain   Final disposition: Likely home once medically cleared Consultants:  Orthopedic surgery  55 minutes with more than 50% spent in reviewing records, counseling patient/family and coordinating care.   Sch Meds:  Scheduled Meds:  aspirin EC  81 mg Oral Daily   benazepril  10 mg Oral Daily   cholecalciferol  1,000 Units Oral Daily   enoxaparin (LOVENOX) injection  40 mg Subcutaneous Q24H   gabapentin  600 mg Oral BID   [START ON 01/21/2023] metFORMIN  850 mg Oral BID WC   nystatin   Topical TID   polyethylene glycol  17 g Oral Daily   propranolol  40 mg Oral Daily   rosuvastatin  20 mg Oral Daily   timolol  1 drop Both Eyes q morning   Continuous Infusions: PRN Meds:.acetaminophen **OR** acetaminophen, HYDROmorphone (DILAUDID) injection, magnesium hydroxide, naLOXone (NARCAN)  injection, ondansetron **OR** ondansetron (ZOFRAN) IV, oxyCODONE-acetaminophen  Antimicrobials: Anti-infectives (From admission, onward)    None        I have personally reviewed the following labs and images: CBC: Recent Labs  Lab 01/18/23 0822  WBC 7.6  NEUTROABS 5.9  HGB 14.4  HCT 46.0  MCV 91.1  PLT 299   BMP  &GFR Recent Labs  Lab 01/18/23 0822  NA 139  K 4.3  CL 105  CO2 21*  GLUCOSE 128*  BUN 38*  CREATININE 1.47*  CALCIUM 10.0   Estimated Creatinine Clearance: 41.9 mL/min (A) (by C-G formula based on SCr of 1.47 mg/dL (H)). Liver & Pancreas: Recent Labs  Lab 01/18/23 0822  AST 22  ALT 16  ALKPHOS 85  BILITOT 0.9  PROT 7.6  ALBUMIN 3.6   Recent Labs  Lab 01/18/23 0822  LIPASE 28   No results for input(s): "AMMONIA" in the last 168 hours. Diabetic: No results for input(s): "HGBA1C" in the last 72 hours. Recent Labs  Lab 01/18/23 2102 01/19/23 0748 01/19/23 1119  GLUCAP 166* 151* 218*   Cardiac Enzymes: No results for input(s): "CKTOTAL", "CKMB", "CKMBINDEX", "TROPONINI" in the last 168 hours. No results for input(s): "PROBNP" in the last 8760 hours. Coagulation Profile: No results for input(s): "INR", "PROTIME" in the last 168 hours. Thyroid Function Tests: No results for input(s): "TSH", "T4TOTAL", "FREET4", "T3FREE", "THYROIDAB" in the last 72 hours. Lipid Profile: No results for input(s): "CHOL", "HDL", "LDLCALC", "TRIG", "CHOLHDL", "LDLDIRECT" in the last 72 hours. Anemia  Panel: No results for input(s): "VITAMINB12", "FOLATE", "FERRITIN", "TIBC", "IRON", "RETICCTPCT" in the last 72 hours. Urine analysis:    Component Value Date/Time   COLORURINE YELLOW 01/18/2023 0822   APPEARANCEUR CLEAR 01/18/2023 0822   LABSPEC 1.017 01/18/2023 0822   PHURINE 5.0 01/18/2023 0822   GLUCOSEU NEGATIVE 01/18/2023 0822   GLUCOSEU NEGATIVE 03/16/2020 1200   HGBUR MODERATE (A) 01/18/2023 0822   HGBUR negative 05/20/2008 1620   BILIRUBINUR NEGATIVE 01/18/2023 0822   BILIRUBINUR Negative 03/16/2020 1138   KETONESUR 5 (A) 01/18/2023 0822   PROTEINUR 30 (A) 01/18/2023 0822   UROBILINOGEN 0.2 03/16/2020 1200   UROBILINOGEN 0.2 03/16/2020 1138   NITRITE NEGATIVE 01/18/2023 0822   LEUKOCYTESUR NEGATIVE 01/18/2023 0822   Sepsis Labs: Invalid input(s): "PROCALCITONIN",  "LACTICIDVEN"  Microbiology: No results found for this or any previous visit (from the past 240 hours).  Radiology Studies: MR THORACIC SPINE WO CONTRAST Result Date: 01/18/2023 CLINICAL DATA:  Bladder cancer. Evaluate for metastatic disease to the thoracic spine. Chronic back pain. Weakness beginning approximately 1 month ago. EXAM: MRI THORACIC SPINE WITHOUT CONTRAST TECHNIQUE: Multiplanar, multisequence MR imaging of the thoracic spine was performed. No intravenous contrast was administered. COMPARISON:  CT angio chest, abdomen and pelvis 01/18/2023. FINDINGS: Alignment: No significant listhesis is present. Mild rightward curvature is centered at T8-9. Thoracic kyphosis is intact. Vertebrae: Multiple metastatic deposits are present throughout the thoracic spine. A 6 mm lesion is present within the right pedicle of T1. An 8 mm lesion is present posterior elements of T2. Anterior lesion at T2 measures 13 mm. Metastatic lesions are present within the T3 vertebral body and the spinous process of T3. A right superior lesion at T5 extends to the pedicle. Rounded metastases at T7 measure 10 and 11 mm. Larger metastases at T8 extend from the superior to inferior endplate, 18 mm. Similar larger lesions are present throughout the remainder of the lower thoracic spine. A right T11 lesion extends into the right pedicle. Diffuse metastatic infiltration is present in the T12 vertebral body. No pathologic fractures are present. Extensive infiltration of the L1 vertebral body is present. Cord:  Normal signal and morphology. Paraspinal and other soft tissues: See CT report from the same day for further description of metastatic disease outside the spine. Disc levels: A central disc protrusion at T8-9 effaces the ventral CSF. The foramina are patent bilaterally. No significant disc disease or stenosis is present above this level. T9-10: Negative. T10-11: Negative. T11-12: A rightward disc protrusion and annular tear is  present without focal stenosis. T12-L1: Negative. IMPRESSION: 1. Multiple metastatic deposits throughout the thoracic spine as described. 2. No pathologic fractures. 3. Central disc protrusion at T8-9 effaces the ventral CSF. 4. Rightward disc protrusion and annular tear at T11-12 without focal stenosis. Electronically Signed   By: Marin Roberts M.D.   On: 01/18/2023 19:20      Damen Windsor T. Seanne Chirico [facility]  If 7PM-7AM, please contact night-coverage [email or web address] 01/19/2023, 12:51 PM

## 2023-01-19 NOTE — Progress Notes (Signed)
Consulted oncology as suggested by Ortho, and talked to Dr. Arbutus Ped who recommended outpatient evaluation for spine mets . Nothing to add inpatient. Waiting to hear back from on call urologist. Palliative medicine consulted as well.

## 2023-01-19 NOTE — Evaluation (Signed)
Physical Therapy Evaluation Patient Details Name: William Faulkner MRN: [id number] DOB: 05-Mar-1934 Today's Date: 01/19/2023  History of Present Illness  88 y.o. male with medical history significant of benign essential tremor, BPH, cholelithiasis, type 2 diabetes, CAD/CABG, aortic aneurysm, arctic insufficiency, hyperlipidemia, hypertension, left rotator cuff tear, diabetic peripheral neuropathy bladder cancer, cerebrovascular disease, stage 3a CKD who presented with history of 2 months of back pain, nausea and weakness. Admitted on 01/18/23 with complaints of increased back pain. CT: Positive for subacute and pathologic appearing T12 compression fracture, with underlying lytic lesion and loss of height progressed since November. MRI: negative  Clinical Impression  Pt admitted as above and presenting with functional mobility limitations 2* generalized weakness, ambulatory balance deficits, decreased activity tolerance, and back pain.  Pt should progress to dc home with assist of family and would benefit from HHPT follow up to maximize IND and safety.        If plan is discharge home, recommend the following: A little help with walking and/or transfers;A little help with bathing/dressing/bathroom;Assistance with cooking/housework;Assist for transportation;Help with stairs or ramp for entrance   Can travel by private vehicle        Equipment Recommendations Rolling walker (2 wheels)  Recommendations for Other Services       Functional Status Assessment Patient has had a recent decline in their functional status and demonstrates the ability to make significant improvements in function in a reasonable and predictable amount of time.     Precautions / Restrictions Precautions Precautions: Back Precaution Booklet Issued: No Precaution Comments: Verbal education provided on "BLT" back precautions during evaluation. Required Braces or Orthoses: Spinal Brace Spinal Brace: Thoracolumbosacral  orthotic;Applied in sitting position Restrictions Weight Bearing Restrictions Per Provider Order: No      Mobility  Bed Mobility Overal bed mobility: Needs Assistance Bed Mobility: Rolling, Sidelying to Sit Rolling: Min assist Sidelying to sit: Mod assist       General bed mobility comments: 2nd person present although not providing physical assistance. VC and pt education provided on log roll technique utilizing bed rail and elevated HOB to assist with bed mobility.    Transfers Overall transfer level: Needs assistance Equipment used: Rolling walker (2 wheels) Transfers: Sit to/from Stand Sit to Stand: Min assist           General transfer comment: 2nd person present although did not provide physical assist for transfer. VC for hand placement with RW management prior to sit to stand transition. While walking in hallway with PT, pt provided max VC to correct standing posture as pt prefers a forward flexed head and rounded shoulders.    Ambulation/Gait Ambulation/Gait assistance: Min assist Gait Distance (Feet): 150 Feet Assistive device: Rolling walker (2 wheels) Gait Pattern/deviations: Step-to pattern, Step-through pattern, Decreased step length - right, Decreased step length - left, Shuffle, Trunk flexed Gait velocity: decr     General Gait Details: cues for posture, position from RW and safety awareness  Stairs            Wheelchair Mobility     Tilt Bed    Modified Rankin (Stroke Patients Only)       Balance Overall balance assessment: Needs assistance Sitting-balance support: No upper extremity supported, Feet supported Sitting balance-Leahy Scale: Fair     Standing balance support: Bilateral upper extremity supported, During functional activity, Reliant on assistive device for balance Standing balance-Leahy Scale: Fair  Pertinent Vitals/Pain Pain Assessment Pain Assessment: 0-10 Pain Score: 1   Pain Location: back Pain Descriptors / Indicators: Aching, Grimacing Pain Intervention(s): Limited activity within patient's tolerance, Monitored during session, Premedicated before session    Home Living Family/patient expects to be discharged to:: Private residence Living Arrangements: Spouse/significant other Available Help at Discharge: Family Type of Home: House Home Access: Stairs to enter Entrance Stairs-Rails: Right Entrance Stairs-Number of Steps: 3 from garage Alternate Level Stairs-Number of Steps: flight Home Layout: Two level;Able to live on main level with bedroom/bathroom Home Equipment: Other (comment);Grab bars - tub/shower;Grab bars - toilet      Prior Function Prior Level of Function : Driving;Independent/Modified Independent             Mobility Comments: Use walking stick       Extremity/Trunk Assessment   Upper Extremity Assessment Upper Extremity Assessment: Right hand dominant LUE Deficits / Details: History of Left shoulder RTC tear    Lower Extremity Assessment Lower Extremity Assessment: Generalized weakness    Cervical / Trunk Assessment Cervical / Trunk Assessment: Kyphotic  Communication   Communication Communication: No apparent difficulties  Cognition Arousal: Alert Behavior During Therapy: WFL for tasks assessed/performed Overall Cognitive Status: Within Functional Limits for tasks assessed                                          General Comments General comments (skin integrity, edema, etc.): VSS on RA. TLSO adjusted while sitting on EOB to provide appropriate support.    Exercises     Assessment/Plan    PT Assessment Patient needs continued PT services  PT Problem List Decreased strength;Decreased activity tolerance;Decreased balance;Decreased mobility;Decreased knowledge of use of DME;Pain;Decreased safety awareness       PT Treatment Interventions DME instruction;Gait training;Stair  training;Functional mobility training;Therapeutic activities;Therapeutic exercise;Balance training;Patient/family education    PT Goals (Current goals can be found in the Care Plan section)  Acute Rehab PT Goals Patient Stated Goal: Regain IND and return home PT Goal Formulation: With patient Time For Goal Achievement: 02/02/23 Potential to Achieve Goals: Good    Frequency Min 1X/week     Co-evaluation PT/OT/SLP Co-Evaluation/Treatment: Yes Reason for Co-Treatment: To address functional/ADL transfers           AM-PAC PT "6 Clicks" Mobility  Outcome Measure Help needed turning from your back to your side while in a flat bed without using bedrails?: A Little Help needed moving from lying on your back to sitting on the side of a flat bed without using bedrails?: A Lot Help needed moving to and from a bed to a chair (including a wheelchair)?: A Little Help needed standing up from a chair using your arms (e.g., wheelchair or bedside chair)?: A Little Help needed to walk in hospital room?: A Little Help needed climbing 3-5 steps with a railing? : A Lot 6 Click Score: 16    End of Session Equipment Utilized During Treatment: Gait belt Activity Tolerance: Patient tolerated treatment well Patient left: in chair;with call bell/phone within reach;with chair alarm set Nurse Communication: Mobility status PT Visit Diagnosis: Unsteadiness on feet (R26.81);Muscle weakness (generalized) (M62.81);Difficulty in walking, not elsewhere classified (R26.2);Pain    Time: 4332-9518 PT Time Calculation (min) (ACUTE ONLY): 35 min   Charges:   PT Evaluation $PT Eval Low Complexity: 1 Low   PT General Charges $$ ACUTE PT VISIT: 1 Visit  Mauro Kaufmann PT Acute Rehabilitation Services Pager [phone] Office [phone]   [facility] 01/19/2023, 12:45 PM

## 2023-01-19 NOTE — Plan of Care (Signed)
  Problem: Education: Goal: Knowledge of General Education information will improve Description: Including pain rating scale, medication(s)/side effects and non-pharmacologic comfort measures Outcome: Progressing   Problem: Activity: Goal: Risk for activity intolerance will decrease Outcome: Progressing   

## 2023-01-19 NOTE — H&P (Signed)
Chief Complaint: Progressive thoracolumbar pain  History:  88 year old M with PMH of CAD/CABG, thoracic cord aneurysm, aortic insufficiency, CKD-3A, CVA, HTN, HLD, bladder cancer and recent diagnosis of vertebral metastasis for which she is followed by orthopedic surgery, presenting with progressive back pain for about 2 months.  No report of trauma or fall.  In ED, CT angio chest, abdomen and pelvis raise concern for subacute and pathologic T12 compression fracture without retropulsion or other complicating features, underlying lytic lesion and progressive loss of height, abnormal bladder base and prostate.  MRI thoracic spine obtained and showed multiple metastatic deposits throughout the thoracic spine, central disc protrusion at T8-9 without canal or foraminal stenosis, rightward disc protrusion and annular tear at T11-12 without focal stenosis but no pathologic fractures.   As a result of this significant thoracolumbar spine pain I was asked by my partner Dr. Aundria Rud to evaluate and make direct mentations.   Review of systems: Patient has multiple medical issues.  But prior to admission he denied any loss of consciousness, headaches, blurry vision.  Normal bowel and bladder function.  No incontinence.  Past Medical History:  Diagnosis Date   AORTIC ANEUR UNSPEC SITE WITHOUT MENTION RUPTURE    AORTIC INSUFFICIENCY    BPH (benign prostatic hyperplasia)    CAD, ARTERY BYPASS GRAFT    Cancer (HCC)    CEREBROVASCULAR DISEASE    DIAB W/O COMP TYPE II/UNS NOT STATED UNCNTRL    Hyperlipidemia    HYPERTENSION, ESSENTIAL NOS    NEOPLASM, MALIGNANT, BLADDER, HX OF    Rotator cuff tear    Left shoulder,     Allergies  Allergen Reactions   Atorvastatin Other (See Comments)    REACTION: increased cpk   Celecoxib Other (See Comments)    REACTION: palpitations= celebrex   Sulfonamide Derivatives Other (See Comments)    Unknown   Zolpidem Tartrate Other (See Comments)    REACTION: made  patient feel weird= ambien    No current facility-administered medications on file prior to encounter.   Current Outpatient Medications on File Prior to Encounter  Medication Sig Dispense Refill   aspirin 81 MG tablet Take 81 mg by mouth daily.     augmented betamethasone dipropionate (DIPROLENE-AF) 0.05 % cream Apply topically 2 (two) times daily. 60 g 0   benazepril (LOTENSIN) 10 MG tablet TAKE 1 TABLET BY MOUTH DAILY 90 tablet 0   cholecalciferol (VITAMIN D) 1000 UNITS tablet Take 1,000 Units by mouth daily.     gabapentin (NEURONTIN) 600 MG tablet TAKE 1 TABLET BY MOUTH 3 TIMES  DAILY (Patient taking differently: Take 600 mg by mouth in the morning and at bedtime.) 270 tablet 3   hydrALAZINE (APRESOLINE) 50 MG tablet TAKE 1 TABLET BY MOUTH TWICE  DAILY 180 tablet 0   hydrochlorothiazide (HYDRODIURIL) 25 MG tablet TAKE 1 TABLET BY MOUTH DAILY 90 tablet 3   ketoconazole (NIZORAL) 2 % cream Apply 1 application topically daily. 30 g 0   metFORMIN (GLUCOPHAGE) 850 MG tablet Take 1 tablet (850 mg total) by mouth 2 (two) times daily with a meal. 180 tablet 1   oxyCODONE-acetaminophen (PERCOCET/ROXICET) 5-325 MG tablet Take 1 tablet by mouth every 8 (eight) hours as needed for moderate pain (pain score 4-6) or severe pain (pain score 7-10).     propranolol (INDERAL) 40 MG tablet TAKE 1 TABLET BY MOUTH DAILY (Patient taking differently: Take 40 mg by mouth daily.) 90 tablet 0   rosuvastatin (CRESTOR) 20 MG tablet TAKE  1 TABLET BY MOUTH DAILY (Patient taking differently: Take 20 mg by mouth daily.) 90 tablet 3   timolol (TIMOPTIC) 0.5 % ophthalmic solution Place 1 drop into both eyes every morning. As directed     Cyanocobalamin ([employer] VITAMIN B-12) 5000 MCG SUBL Take 5,000 mcg by mouth daily. (Patient not taking: Reported on 09/18/2022)      Physical Exam: Vitals:   01/19/23 0630 01/19/23 0957  BP: 132/63 (!) 108/56  Pulse: 92 74  Resp: 16 16  Temp: 98.2 F (36.8 C) 98 F (36.7 C)  SpO2: 94%  95%   Body mass index is 23.62 kg/m. Patient is currently in bed sleeping.  TLSO brace is applied. He is arousable.  He follows commands. He is moving all 4 extremities but it is difficult to get an accurate assessment of his motor strength.  He does report sensation light touch is intact.  Negative Babinski test, negative Hoffman test. No shortness of breath or chest pain.  Abdomen is soft and nontender. He does complain of diffuse thoracolumbar pain.  Image: MR THORACIC SPINE WO CONTRAST Result Date: 01/18/2023 CLINICAL DATA:  Bladder cancer. Evaluate for metastatic disease to the thoracic spine. Chronic back pain. Weakness beginning approximately 1 month ago. EXAM: MRI THORACIC SPINE WITHOUT CONTRAST TECHNIQUE: Multiplanar, multisequence MR imaging of the thoracic spine was performed. No intravenous contrast was administered. COMPARISON:  CT angio chest, abdomen and pelvis 01/18/2023. FINDINGS: Alignment: No significant listhesis is present. Mild rightward curvature is centered at T8-9. Thoracic kyphosis is intact. Vertebrae: Multiple metastatic deposits are present throughout the thoracic spine. A 6 mm lesion is present within the right pedicle of T1. An 8 mm lesion is present posterior elements of T2. Anterior lesion at T2 measures 13 mm. Metastatic lesions are present within the T3 vertebral body and the spinous process of T3. A right superior lesion at T5 extends to the pedicle. Rounded metastases at T7 measure 10 and 11 mm. Larger metastases at T8 extend from the superior to inferior endplate, 18 mm. Similar larger lesions are present throughout the remainder of the lower thoracic spine. A right T11 lesion extends into the right pedicle. Diffuse metastatic infiltration is present in the T12 vertebral body. No pathologic fractures are present. Extensive infiltration of the L1 vertebral body is present. Cord:  Normal signal and morphology. Paraspinal and other soft tissues: See CT report from the  same day for further description of metastatic disease outside the spine. Disc levels: A central disc protrusion at T8-9 effaces the ventral CSF. The foramina are patent bilaterally. No significant disc disease or stenosis is present above this level. T9-10: Negative. T10-11: Negative. T11-12: A rightward disc protrusion and annular tear is present without focal stenosis. T12-L1: Negative. IMPRESSION: 1. Multiple metastatic deposits throughout the thoracic spine as described. 2. No pathologic fractures. 3. Central disc protrusion at T8-9 effaces the ventral CSF. 4. Rightward disc protrusion and annular tear at T11-12 without focal stenosis. Electronically Signed   By: Marin Roberts M.D.   On: 01/18/2023 19:20   CT Angio Chest/Abd/Pel for Dissection W and/or Wo Contrast Result Date: 01/18/2023 CLINICAL DATA:  88 year old male with pain, weakness. Known mild aneurysmal dilatation of the ascending thoracic aorta. Bladder cancer. EXAM: CT ANGIOGRAPHY CHEST, ABDOMEN AND PELVIS TECHNIQUE: Non-contrast CT of the chest was initially obtained. Multidetector CT imaging through the chest, abdomen and pelvis was performed using the standard protocol during bolus administration of intravenous contrast. Multiplanar reconstructed images and MIPs were obtained and reviewed to  evaluate the vascular anatomy. RADIATION DOSE REDUCTION: This exam was performed according to the departmental dose-optimization program which includes automated exposure control, adjustment of the mA and/or kV according to patient size and/or use of iterative reconstruction technique. CONTRAST:  75mL OMNIPAQUE IOHEXOL 350 MG/ML SOLN COMPARISON:  CTA chest 08/02/2022 and earlier. Restaging CT Abdomen and Pelvis 12/11/2022 FINDINGS: CTA CHEST FINDINGS Cardiovascular: Previous CABG. Extensive Calcified aortic atherosclerosis. Tortuous ascending aorta is stable, smoothly tapers in the arch. Negative for thoracic aortic dissection, periaortic hematoma.  Heart size is stable, within normal limits. No pericardial effusion. Little contrast in the pulmonary arteries. Proximal great vessel atherosclerosis appears stable since July. Mediastinum/Nodes: Negative for mediastinal hematoma, mass, lymphadenopathy. Lungs/Pleura: Stable lung volumes since July. Patchy lung base atelectasis or scarring has not significantly changed. No pleural effusion. Small volume new retained secretions in the trachea just below the thoracic inlet on series 4 image 30. But elsewhere the major airways are patent. No active pulmonary inflammation is identified. Musculoskeletal: Chronic sternotomy. Chronic right 2nd rib deformity. T1 through T11 thoracic vertebrae appear stable since July. T12 compression fracture with mild comminution is new since July but was present, subtle in November when other skeletal metastatic disease was suspected. Central T12 loss of vertebral body height up to 30% (series 6, image 105). Lucency within the T12 body appears progressed since July. No retropulsion. T12 pedicles and posterior elements appear intact and aligned. There is mild anterior and lateral prevertebral soft tissue or edema which is increased since November on series 11, image 56. No epidural tumor by CT. Review of the MIP images confirms the above findings. CTA ABDOMEN AND PELVIS FINDINGS VASCULAR Aortoiliac calcified atherosclerosis. Extensive aortic and branch calcified atherosclerosis but the major arterial structures in the abdomen and pelvis remain patent. Negative for abdominal aortic dissection or aneurysm. Review of the MIP images confirms the above findings. NON-VASCULAR Hepatobiliary: Stable liver. Cholelithiasis. No gallbladder inflammation by CT. Pancreas: Diminutive. Spleen: Diminutive. Adrenals/Urinary Tract: Normal adrenal glands. Nonobstructed kidneys with symmetric renal enhancement. No hydroureter. Abnormal prostate at the base of the bladder as below. Diminutive bladder similar to  the November CT. Stomach/Bowel: Decompressed large bowel from the splenic flexure distally but transverse colon is redundant with moderate retained gas and stool, increased since November. Low-density retained stool in the right colon. Cecum is on a lax mesentery. No dilated small bowel. No large bowel inflammation identified. Stomach and duodenum appear negative. No free air or free fluid. Lymphatic: No lymphadenopathy identified in the abdomen or pelvis. Reproductive: Abnormally heterogeneous, lobulated, masslike appearance of the prostate on series 10, image 203 not significantly changed from the restaging CT last month (please see that report) Other: No pelvis free fluid. Musculoskeletal: Lumbar vertebrae appear stable since November. Medial left iliac sclerotic bone lesions have not significantly changed. Sacrum and SI joints appear intact. Pelvis and proximal femurs appear to remain intact. Review of the MIP images confirms the above findings. IMPRESSION: 1. Negative for Aortic Dissection. Stable mild fusiform aneurysmal enlargement of the ascending aorta. Severe Aortic Atherosclerosis (ICD10-I70.0). 2. Positive for a subacute and pathologic appearing T12 compression fracture, with underlying lytic lesion and loss of height progressed since November. No retropulsion or other complicating features. In conjunction with November CT findings of skeletal metastatic disease, abnormal bladder base and prostate as detailed on that exam. 3. No other acute finding in the Chest, Abdomen, or Pelvis. Cholelithiasis. Electronically Signed   By: Odessa Fleming M.D.   On: 01/18/2023 10:37   Lumbar MRI  completed on 11/13/2022 at [facility].  Multiple metastatic lesions throughout the thoracolumbar spine and ilium is noted.  There is a large T12 vertebral body lesion with a chronic mild anterior superior endplate wedge deformity.  No significant stenosis.  Degenerative changes at L4-5 and changes consistent with a prior  decompressive surgery.   A/P: William Faulkner is a very pleasant 88 year old gentleman with a 2 to 28-month history of progressive debilitating thoracolumbar pain.  Ultimately because of the severe pain he was brought to the emergency room and admitted.  Prior to this he was seen by my partner Dr. Drucie Ip for pain medical management.  The patient's imaging studies clearly demonstrate metastatic disease affecting thoracic, lumbar, and sacral spine as well as the iliac wing.  Patient has a history of bladder cancer and a questionable prostate CA.  According to the family the patient has not had a formal oncology workup as of yet.  1.  I believe the patient's pain in the thoracolumbar spine is due to the multiple metastatic lesions.  I do not see anyone fracture that would be amenable to a kyphoplasty.  However the patient could develop a fracture and then I would recommend moving forward with a kyphoplasty. 2.  The TLSO brace does not need to be applied when he is laying in bed.  More likely this will be uncomfortable and could progress his pain.  The brace should be used when he is transitioning from seated to standing or when ambulating. 3.  Patient needs a formal oncology consultation and workup.  I will contact the hospitalist team to inform them of this.  More than likely a biopsy is going to be required so that appropriate treatment can be rendered. 4.  Patient's has been seen by Dr. Cardell Peach the urologist who according to the patient's wife had recommended a prostate biopsy as well as a PET scan.  PET scan was to be done this week as an outpatient and so we should look into keeping that appointment.  Dr. Cardell Peach could evaluate the patient determine whether or not the biopsy that was scheduled late January needs to be moved up. 5.  I would also make recommendations for the patient to be seen by the palliative health care team.  That way an appropriate pain management program can be sent in place which could facilitate  him going home.  I will continue to monitor the patient's progress.  Any further questions or concerns arise please not hesitate contact me.

## 2023-01-19 NOTE — Progress Notes (Signed)
° ° ° °  Patient Name: William Faulkner           DOB: 1934/10/14  MRN: [id number]      Admission Date: 01/18/2023  Attending Provider: Almon Hercules, MD  Primary Diagnosis: T12 compression fracture, initial encounter (HCC)   Level of care: Med-Surg   OVERNIGHT PROGRESS REPORT   Notified by nursing staff the patient has self removed TLSO brace and is refusing to wear it.   Patient understands that he has a subacute and pathologic appearing T12 compression fractur and needs to be seen by Ortho before removing brace.  Per RN, patient has remove the brace because it it is "uncomfortable."  Patient has been offered education as to why brace is needed multiple times by multiple nursing staff, however patient refuses to wear brace.    Nursing staff to continue monitoring for changes.    William Harada, DNP, ACNPC- AG [facility]

## 2023-01-19 NOTE — Care Management Obs Status (Signed)
MEDICARE OBSERVATION STATUS NOTIFICATION   Patient Details  Name: William Faulkner MRN: [id number] Date of Birth: 06-Jun-1934   Medicare Observation Status Notification Given:  Yes    Adrian Prows, RN 01/19/2023, 5:40 PM

## 2023-01-19 NOTE — Evaluation (Signed)
Occupational Therapy Evaluation/Discharge Patient Details Name: William Faulkner MRN: [id number] DOB: 1934-04-26 Today's Date: 01/19/2023   History of Present Illness 88 y.o. male with medical history significant of benign essential tremor, BPH, cholelithiasis, type 2 diabetes, CAD/CABG, aortic aneurysm, arctic insufficiency, hyperlipidemia, hypertension, left rotator cuff tear, diabetic peripheral neuropathy bladder cancer, cerebrovascular disease, stage 3a CKD who presented with history of 2 months of back pain, nausea and weakness. Admitted on 01/18/23 with complaints of increased back pain. CT: Positive for subacute and pathologic appearing T12 compression fracture, with underlying lytic lesion and loss of height progressed since November. MRI: negative   Clinical Impression   Pt admitted with the above diagnosis. Pt currently with functional limitations due to the deficits listed below (see OT Problem List). Prior to admit, pt reports that he was independent/mod I with all ADL tasks and functional mobility using a walking stick.  Pt will benefit from follow HHOT services to increase their safety and independence with carry over of back precautions during ADL and functional mobility. See below for recommended DME needs. Thank you for the referral. No additional acute OT needs identified. Recommend pt work with mobility specialist while admitted.          If plan is discharge home, recommend the following: A little help with walking and/or transfers;A little help with bathing/dressing/bathroom;Assist for transportation    Functional Status Assessment  Patient has had a recent decline in their functional status and demonstrates the ability to make significant improvements in function in a reasonable and predictable amount of time.  Equipment Recommendations  BSC/3in1       Precautions / Restrictions Precautions Precautions: Back Precaution Booklet Issued: No Precaution Comments: Verbal  education provided on "BLT" back precautions during evaluation. Required Braces or Orthoses: Spinal Brace Spinal Brace: Thoracolumbosacral orthotic;Applied in sitting position Restrictions Weight Bearing Restrictions Per Provider Order: No      Mobility Bed Mobility Overal bed mobility: Needs Assistance Bed Mobility: Supine to Sit     Supine to sit: HOB elevated, Used rails, Mod assist     General bed mobility comments: 2nd person present although not providing physical assistance. VC and pt education provided on log roll technique utilizing bed rail and elevated HOB to assist with bed mobility.    Transfers Overall transfer level: Needs assistance Equipment used: Rolling walker (2 wheels) Transfers: Sit to/from Stand, Bed to chair/wheelchair/BSC Sit to Stand: Min assist     Step pivot transfers: Min assist     General transfer comment: 2nd person present although did not provide physical assist for transfer. VC for hand placement with RW management prior to sit to stand transition. While walking in hallway with PT, pt provided max VC to correct standing posture as pt prefers a forward flexed head and rounded shoulders.      Balance Overall balance assessment: Needs assistance Sitting-balance support: No upper extremity supported, Feet supported Sitting balance-Leahy Scale: Fair     Standing balance support: Bilateral upper extremity supported, During functional activity, Reliant on assistive device for balance Standing balance-Leahy Scale: Fair        ADL either performed or assessed with clinical judgement   ADL Overall ADL's : Needs assistance/impaired Eating/Feeding: Set up;Sitting   Grooming: Wash/dry hands;Wash/dry face;Oral care;Set up;Sitting   Upper Body Bathing: Set up;Sitting   Lower Body Bathing: Moderate assistance;Cueing for back precautions;Adhering to back precautions;Sit to/from stand   Upper Body Dressing : Set up;Sitting   Lower Body  Dressing: Moderate assistance;Sit to/from stand  Toilet Transfer: Minimal assistance;Cueing for safety;Cueing for sequencing;Ambulation;Rolling walker (2 wheels)   Toileting- Clothing Manipulation and Hygiene: Minimal assistance;Sit to/from stand               Vision Baseline Vision/History: 3 Glaucoma;1 Wears glasses Ability to See in Adequate Light: 0 Adequate Patient Visual Report: No change from baseline Vision Assessment?: No apparent visual deficits     Perception Perception: Not tested       Praxis Praxis: Not tested       Pertinent Vitals/Pain Pain Assessment Pain Assessment: 0-10 Pain Score: 1  (at rest in bed) Pain Location: back Pain Descriptors / Indicators: Aching, Grimacing Pain Intervention(s): Limited activity within patient's tolerance, Monitored during session, Repositioned     Extremity/Trunk Assessment Upper Extremity Assessment Upper Extremity Assessment: Right hand dominant;LUE deficits/detail LUE Deficits / Details: History of Left shoulder RTC tear   Lower Extremity Assessment Lower Extremity Assessment: Defer to PT evaluation   Cervical / Trunk Assessment Cervical / Trunk Assessment: Kyphotic   Communication Communication Communication: No apparent difficulties   Cognition Arousal: Alert Behavior During Therapy: WFL for tasks assessed/performed Overall Cognitive Status: Within Functional Limits for tasks assessed    General Comments  VSS on RA. TLSO adjusted while sitting on EOB to provide appropriate support.            Home Living Family/patient expects to be discharged to:: Private residence Living Arrangements: Spouse/significant other   Type of Home: House Home Access: Stairs to enter [employer] of Steps: 3 from garage Entrance Stairs-Rails: Right Home Layout: Two level;Able to live on main level with bedroom/bathroom Alternate Level Stairs-Number of Steps: flight   Bathroom Shower/Tub: Company secretary: Standard     Home Equipment: Other (comment);Grab bars - tub/shower;Grab bars - toilet (walking stick, adjustable bed)          Prior Functioning/Environment Prior Level of Function : Driving;Independent/Modified Independent  Mobility Comments: Use walking stick          OT Problem List: Decreased strength;Decreased activity tolerance;Decreased knowledge of use of DME or AE;Decreased knowledge of precautions;Impaired balance (sitting and/or standing)         OT Goals(Current goals can be found in the care plan section) Acute Rehab OT Goals Patient Stated Goal: to decrease back pain OT Goal Formulation: All assessment and education complete, DC therapy  OT Frequency:  1X visit    Co-evaluation PT/OT/SLP Co-Evaluation/Treatment: Yes Reason for Co-Treatment: To address functional/ADL transfers          AM-PAC OT "6 Clicks" Daily Activity     Outcome Measure Help from another person eating meals?: A Little Help from another person taking care of personal grooming?: A Little Help from another person toileting, which includes using toliet, bedpan, or urinal?: A Lot Help from another person bathing (including washing, rinsing, drying)?: A Lot Help from another person to put on and taking off regular upper body clothing?: A Little Help from another person to put on and taking off regular lower body clothing?: A Lot 6 Click Score: 15   End of Session Equipment Utilized During Treatment: Rolling walker (2 wheels);Back brace Nurse Communication: Mobility status  Activity Tolerance: Patient tolerated treatment well Patient left: in chair;with call bell/phone within reach  OT Visit Diagnosis: Unsteadiness on feet (R26.81);Muscle weakness (generalized) (M62.81)                Time: 2956-2130 OT Time Calculation (min): 35 min Charges:  OT General Charges $  OT Visit: 1 Visit OT Evaluation $OT Eval Low Complexity: 1 Low  Limmie Patricia, OTR/L,CBIS   Supplemental OT - [facility] and [facility] Secure Chat Preferred    Serah Nicoletti, Charisse March 01/19/2023, 11:35 AM

## 2023-01-19 NOTE — Consult Note (Signed)
Urology Consult   Physician requesting consult: Candelaria Stagers, MD  Reason for consult: Metastatic disease to spine, possible prostate cancer, history of bladder cancer  History of Present Illness: William Faulkner is a 88 y.o. male seen in consultation from Dr. Alanda Slim for evaluation of skeletal metastatic disease with history of bladder cancer and ongoing evaluation for prostate cancer.  He has a past history of low risk nonmuscle invasive bladder cancer, initially diagnosed in 2000.  No recurrences to date.  Cystoscopy by Dr. Kirtland Bouchard in December 2024 showed no bladder lesions, and obstructing prostate gland with intravesical protrusion.  He was evaluated with a lumbar spine MRI due to back pain.  This showed metastatic lesions involving the lumbar spine, sacrum, and ilium.  Digital rectal exam by Dr. Purnell Shoemaker demonstrated a 100 g gland which was firm and nodular.  His PSA from 12/24 was 181.  He is scheduled to undergo further evaluation with a PSMA PET scan and possible prostate biopsy.  He was admitted to the hospital yesterday due to worsening low back pain.   Past Medical History:  Diagnosis Date   AORTIC ANEUR UNSPEC SITE WITHOUT MENTION RUPTURE    AORTIC INSUFFICIENCY    BPH (benign prostatic hyperplasia)    CAD, ARTERY BYPASS GRAFT    Cancer (HCC)    CEREBROVASCULAR DISEASE    DIAB W/O COMP TYPE II/UNS NOT STATED UNCNTRL    Hyperlipidemia    HYPERTENSION, ESSENTIAL NOS    NEOPLASM, MALIGNANT, BLADDER, HX OF    Rotator cuff tear    Left shoulder,     Past Surgical History:  Procedure Laterality Date   BACK SURGERY  2003   lower   BLADDER TUMOR EXCISION     Surgery x6, Dr.Tannebaum   BLEPHAROPLASTY  1980   bilaterally, Dr Nile Riggs   COLONOSCOPY W/ POLYPECTOMY     Dr Aw[facility]   CORONARY ARTERY BYPASS GRAFT  2011   x 5   CYSTOSCOPY  09/02/2012   INGUINAL HERNIA REPAIR  10/24/2011   Procedure: HERNIA REPAIR INGUINAL ADULT;  Surgeon: Velora Heckler, MD;  Location: [facility];  Service:  General;  Laterality: Right;  Repair Right Inguinal Hernia with Mesh, Umbilical Hernia Repair with Mesh   LUMBAR EPIDURAL INJECTION  2008   ROTATOR CUFF REPAIR  2009   right; Dr Simonne Come   SHOULDER ARTHROSCOPY WITH ROTATOR CUFF REPAIR AND SUBACROMIAL DECOMPRESSION  02/14/2014   Right, SAD/DCR, biceps tenodesis   UMBILICAL HERNIA REPAIR  10/24/2011   Procedure: HERNIA REPAIR UMBILICAL ADULT;  Surgeon: Velora Heckler, MD;  Location: [facility];  Service: General;  Laterality: N/A;    Medications:  Scheduled Meds:  aspirin EC  81 mg Oral Daily   cholecalciferol  1,000 Units Oral Daily   enoxaparin (LOVENOX) injection  40 mg Subcutaneous Q24H   gabapentin  600 mg Oral BID   insulin aspart  0-9 Units Subcutaneous TID WC   [START ON 01/21/2023] metFORMIN  850 mg Oral BID WC   nystatin   Topical TID   polyethylene glycol  17 g Oral Daily   propranolol  40 mg Oral Daily   rosuvastatin  20 mg Oral Daily   timolol  1 drop Both Eyes q morning   Continuous Infusions: PRN Meds:.acetaminophen **OR** acetaminophen, HYDROmorphone (DILAUDID) injection, magnesium hydroxide, naLOXone (NARCAN)  injection, ondansetron **OR** ondansetron (ZOFRAN) IV, oxyCODONE-acetaminophen  Allergies:  Allergies  Allergen Reactions   Atorvastatin Other (See Comments)    REACTION: increased cpk   Celecoxib  Other (See Comments)    REACTION: palpitations= celebrex   Sulfonamide Derivatives Other (See Comments)    Unknown   Zolpidem Tartrate Other (See Comments)    REACTION: made patient feel weird= ambien    Family History  Problem Relation Age of Onset   Diabetes Mother    Tremor Father    Other Sister    Stroke Neg Hx    Heart disease Neg Hx    Cancer Neg Hx     Social History:  reports that he quit smoking about 39 years ago. His smoking use included cigarettes. He started smoking about 54 years ago. He has a 4.5 pack-year smoking history. He has never used smokeless tobacco. He reports that he does not drink  alcohol and does not use drugs.  ROS: A complete review of systems was performed.  All systems are negative except for pertinent findings as noted.  Physical Exam:  Vital signs in last 24 hours: Temp:  [97.9 F (36.6 C)-98.9 F (37.2 C)] 98 F (36.7 C) (12/29 0957) Pulse Rate:  [74-92] 74 (12/29 0957) Resp:  [15-17] 16 (12/29 0957) BP: (108-161)/(56-64) 108/56 (12/29 0957) SpO2:  [92 %-95 %] 95 % (12/29 0957) GENERAL APPEARANCE:  Well appearing, well developed, well nourished, NAD HEENT:  Atraumatic, normocephalic ABDOMEN:  Soft, non-tender, no masses EXTREMITIES:  No clubbing, cyanosis, or edema NEUROLOGIC:  CN II-XII grossly intact BACK:  Non-tender to palpation, No CVAT SKIN:  Warm, dry, and intact  Laboratory Data:  Recent Labs    01/18/23 0822  WBC 7.6  HGB 14.4  HCT 46.0  PLT 299    Recent Labs    01/18/23 0822  NA 139  K 4.3  CL 105  GLUCOSE 128*  BUN 38*  CALCIUM 10.0  CREATININE 1.47*     Results for orders placed or performed during the hospital encounter of 01/18/23 (from the past 24 hours)  Glucose, capillary     Status: Abnormal   Collection Time: 01/18/23  9:02 PM  Result Value Ref Range   Glucose-Capillary 166 (H) 70 - 99 mg/dL  Glucose, capillary     Status: Abnormal   Collection Time: 01/19/23  7:48 AM  Result Value Ref Range   Glucose-Capillary 151 (H) 70 - 99 mg/dL  Glucose, capillary     Status: Abnormal   Collection Time: 01/19/23 11:19 AM  Result Value Ref Range   Glucose-Capillary 218 (H) 70 - 99 mg/dL  Glucose, capillary     Status: Abnormal   Collection Time: 01/19/23  4:44 PM  Result Value Ref Range   Glucose-Capillary 136 (H) 70 - 99 mg/dL   No results found for this or any previous visit (from the past 240 hours).  Renal Function: Recent Labs    01/18/23 1610  CREATININE 1.47*   Estimated Creatinine Clearance: 41.9 mL/min (A) (by C-G formula based on SCr of 1.47 mg/dL (H)).  Radiologic Imaging: MR THORACIC SPINE WO  CONTRAST Result Date: 01/18/2023 CLINICAL DATA:  Bladder cancer. Evaluate for metastatic disease to the thoracic spine. Chronic back pain. Weakness beginning approximately 1 month ago. EXAM: MRI THORACIC SPINE WITHOUT CONTRAST TECHNIQUE: Multiplanar, multisequence MR imaging of the thoracic spine was performed. No intravenous contrast was administered. COMPARISON:  CT angio chest, abdomen and pelvis 01/18/2023. FINDINGS: Alignment: No significant listhesis is present. Mild rightward curvature is centered at T8-9. Thoracic kyphosis is intact. Vertebrae: Multiple metastatic deposits are present throughout the thoracic spine. A 6 mm lesion is present within the right  pedicle of T1. An 8 mm lesion is present posterior elements of T2. Anterior lesion at T2 measures 13 mm. Metastatic lesions are present within the T3 vertebral body and the spinous process of T3. A right superior lesion at T5 extends to the pedicle. Rounded metastases at T7 measure 10 and 11 mm. Larger metastases at T8 extend from the superior to inferior endplate, 18 mm. Similar larger lesions are present throughout the remainder of the lower thoracic spine. A right T11 lesion extends into the right pedicle. Diffuse metastatic infiltration is present in the T12 vertebral body. No pathologic fractures are present. Extensive infiltration of the L1 vertebral body is present. Cord:  Normal signal and morphology. Paraspinal and other soft tissues: See CT report from the same day for further description of metastatic disease outside the spine. Disc levels: A central disc protrusion at T8-9 effaces the ventral CSF. The foramina are patent bilaterally. No significant disc disease or stenosis is present above this level. T9-10: Negative. T10-11: Negative. T11-12: A rightward disc protrusion and annular tear is present without focal stenosis. T12-L1: Negative. IMPRESSION: 1. Multiple metastatic deposits throughout the thoracic spine as described. 2. No pathologic  fractures. 3. Central disc protrusion at T8-9 effaces the ventral CSF. 4. Rightward disc protrusion and annular tear at T11-12 without focal stenosis. Electronically Signed   By: Marin Roberts M.D.   On: 01/18/2023 19:20   CT Angio Chest/Abd/Pel for Dissection W and/or Wo Contrast Result Date: 01/18/2023 CLINICAL DATA:  88 year old male with pain, weakness. Known mild aneurysmal dilatation of the ascending thoracic aorta. Bladder cancer. EXAM: CT ANGIOGRAPHY CHEST, ABDOMEN AND PELVIS TECHNIQUE: Non-contrast CT of the chest was initially obtained. Multidetector CT imaging through the chest, abdomen and pelvis was performed using the standard protocol during bolus administration of intravenous contrast. Multiplanar reconstructed images and MIPs were obtained and reviewed to evaluate the vascular anatomy. RADIATION DOSE REDUCTION: This exam was performed according to the departmental dose-optimization program which includes automated exposure control, adjustment of the mA and/or kV according to patient size and/or use of iterative reconstruction technique. CONTRAST:  75mL OMNIPAQUE IOHEXOL 350 MG/ML SOLN COMPARISON:  CTA chest 08/02/2022 and earlier. Restaging CT Abdomen and Pelvis 12/11/2022 FINDINGS: CTA CHEST FINDINGS Cardiovascular: Previous CABG. Extensive Calcified aortic atherosclerosis. Tortuous ascending aorta is stable, smoothly tapers in the arch. Negative for thoracic aortic dissection, periaortic hematoma. Heart size is stable, within normal limits. No pericardial effusion. Little contrast in the pulmonary arteries. Proximal great vessel atherosclerosis appears stable since July. Mediastinum/Nodes: Negative for mediastinal hematoma, mass, lymphadenopathy. Lungs/Pleura: Stable lung volumes since July. Patchy lung base atelectasis or scarring has not significantly changed. No pleural effusion. Small volume new retained secretions in the trachea just below the thoracic inlet on series 4 image 30.  But elsewhere the major airways are patent. No active pulmonary inflammation is identified. Musculoskeletal: Chronic sternotomy. Chronic right 2nd rib deformity. T1 through T11 thoracic vertebrae appear stable since July. T12 compression fracture with mild comminution is new since July but was present, subtle in November when other skeletal metastatic disease was suspected. Central T12 loss of vertebral body height up to 30% (series 6, image 105). Lucency within the T12 body appears progressed since July. No retropulsion. T12 pedicles and posterior elements appear intact and aligned. There is mild anterior and lateral prevertebral soft tissue or edema which is increased since November on series 11, image 56. No epidural tumor by CT. Review of the MIP images confirms the above findings. CTA ABDOMEN AND  PELVIS FINDINGS VASCULAR Aortoiliac calcified atherosclerosis. Extensive aortic and branch calcified atherosclerosis but the major arterial structures in the abdomen and pelvis remain patent. Negative for abdominal aortic dissection or aneurysm. Review of the MIP images confirms the above findings. NON-VASCULAR Hepatobiliary: Stable liver. Cholelithiasis. No gallbladder inflammation by CT. Pancreas: Diminutive. Spleen: Diminutive. Adrenals/Urinary Tract: Normal adrenal glands. Nonobstructed kidneys with symmetric renal enhancement. No hydroureter. Abnormal prostate at the base of the bladder as below. Diminutive bladder similar to the November CT. Stomach/Bowel: Decompressed large bowel from the splenic flexure distally but transverse colon is redundant with moderate retained gas and stool, increased since November. Low-density retained stool in the right colon. Cecum is on a lax mesentery. No dilated small bowel. No large bowel inflammation identified. Stomach and duodenum appear negative. No free air or free fluid. Lymphatic: No lymphadenopathy identified in the abdomen or pelvis. Reproductive: Abnormally  heterogeneous, lobulated, masslike appearance of the prostate on series 10, image 203 not significantly changed from the restaging CT last month (please see that report) Other: No pelvis free fluid. Musculoskeletal: Lumbar vertebrae appear stable since November. Medial left iliac sclerotic bone lesions have not significantly changed. Sacrum and SI joints appear intact. Pelvis and proximal femurs appear to remain intact. Review of the MIP images confirms the above findings. IMPRESSION: 1. Negative for Aortic Dissection. Stable mild fusiform aneurysmal enlargement of the ascending aorta. Severe Aortic Atherosclerosis (ICD10-I70.0). 2. Positive for a subacute and pathologic appearing T12 compression fracture, with underlying lytic lesion and loss of height progressed since November. No retropulsion or other complicating features. In conjunction with November CT findings of skeletal metastatic disease, abnormal bladder base and prostate as detailed on that exam. 3. No other acute finding in the Chest, Abdomen, or Pelvis. Cholelithiasis. Electronically Signed   By: Odessa Fleming M.D.   On: 01/18/2023 10:37    I independently reviewed the above imaging studies.  Impression/Recommendation Metastatic disease to thoracic, lumbar spine, sacrum - likely prostate cancer given elevated PSA and abnormal DRE. Elevated PSA  PSMA PET scan pending on 01/21/23 He is scheduled for prostate biopsy by Dr. Cardell Peach No acute intervention indicated at this time. Will advise Dr. Cardell Peach of admission  [facility] 01/19/2023, 4:48 PM

## 2023-01-19 NOTE — TOC Progression Note (Signed)
Transition of Care [facility]) - Progression Note    Patient Details  Name: William Faulkner MRN: [id number] Date of Birth: 03-01-1934  [facility] CM/SW Contact  Adrian Prows, RN Phone Number: 01/19/2023, 5:48 PM  Clinical Narrative:    Patient asleep; spoke w/ pt's wife Traveion Rotundo and family in room; this RN, CM explained MOON; pt's wife Charlie Pitter verbalized understanding, and signed document; she was given copy of MOON.        Expected Discharge Plan and Services                                               Social Determinants of Health (SDOH) Interventions SDOH Screenings   Food Insecurity: No Food Insecurity (01/18/2023)  Housing: Low Risk  (01/18/2023)  Transportation Needs: No Transportation Needs (01/18/2023)  Utilities: Not At Risk (01/18/2023)  Depression (PHQ2-9): Low Risk  (09/18/2022)  Tobacco Use: Medium Risk (01/18/2023)    Readmission Risk Interventions     No data to display

## 2023-01-19 NOTE — Progress Notes (Signed)
Patient removed TLSO Brace x 2 and refusing to replace it at this time. Patient was educated to the need to keep the brace on d/t compression fracture and acknowledged, but reports he cannot sleep with the brace on. On-Call NP Virgel Manifold) made aware and acknowledged.

## 2023-01-19 NOTE — Progress Notes (Addendum)
Patients family requesting insulin to be held due to patient not eating lunch or dinner. MD made aware. Patient also had not voided since 0900. Patient bladder scanned and showed 80ml . MD made aware and NS bolus ordered.

## 2023-01-20 ENCOUNTER — Inpatient Hospital Stay (HOSPITAL_COMMUNITY): Payer: Medicare Other

## 2023-01-20 ENCOUNTER — Observation Stay (HOSPITAL_COMMUNITY): Payer: Medicare Other

## 2023-01-20 DIAGNOSIS — N179 Acute kidney failure, unspecified: Secondary | ICD-10-CM | POA: Diagnosis present

## 2023-01-20 DIAGNOSIS — G250 Essential tremor: Secondary | ICD-10-CM | POA: Diagnosis not present

## 2023-01-20 DIAGNOSIS — G893 Neoplasm related pain (acute) (chronic): Secondary | ICD-10-CM | POA: Diagnosis present

## 2023-01-20 DIAGNOSIS — M549 Dorsalgia, unspecified: Secondary | ICD-10-CM | POA: Diagnosis present

## 2023-01-20 DIAGNOSIS — H409 Unspecified glaucoma: Secondary | ICD-10-CM | POA: Diagnosis present

## 2023-01-20 DIAGNOSIS — I061 Rheumatic aortic insufficiency: Secondary | ICD-10-CM | POA: Diagnosis present

## 2023-01-20 DIAGNOSIS — M5134 Other intervertebral disc degeneration, thoracic region: Secondary | ICD-10-CM | POA: Diagnosis not present

## 2023-01-20 DIAGNOSIS — E1142 Type 2 diabetes mellitus with diabetic polyneuropathy: Secondary | ICD-10-CM | POA: Diagnosis present

## 2023-01-20 DIAGNOSIS — Z7984 Long term (current) use of oral hypoglycemic drugs: Secondary | ICD-10-CM | POA: Diagnosis not present

## 2023-01-20 DIAGNOSIS — E43 Unspecified severe protein-calorie malnutrition: Secondary | ICD-10-CM | POA: Diagnosis present

## 2023-01-20 DIAGNOSIS — N1831 Chronic kidney disease, stage 3a: Secondary | ICD-10-CM | POA: Diagnosis present

## 2023-01-20 DIAGNOSIS — C61 Malignant neoplasm of prostate: Secondary | ICD-10-CM | POA: Diagnosis present

## 2023-01-20 DIAGNOSIS — Z87891 Personal history of nicotine dependence: Secondary | ICD-10-CM | POA: Diagnosis not present

## 2023-01-20 DIAGNOSIS — E860 Dehydration: Secondary | ICD-10-CM | POA: Diagnosis present

## 2023-01-20 DIAGNOSIS — R972 Elevated prostate specific antigen [PSA]: Secondary | ICD-10-CM | POA: Diagnosis not present

## 2023-01-20 DIAGNOSIS — Z79899 Other long term (current) drug therapy: Secondary | ICD-10-CM | POA: Diagnosis not present

## 2023-01-20 DIAGNOSIS — E785 Hyperlipidemia, unspecified: Secondary | ICD-10-CM | POA: Diagnosis present

## 2023-01-20 DIAGNOSIS — S22080A Wedge compression fracture of T11-T12 vertebra, initial encounter for closed fracture: Secondary | ICD-10-CM | POA: Diagnosis not present

## 2023-01-20 DIAGNOSIS — Z8551 Personal history of malignant neoplasm of bladder: Secondary | ICD-10-CM | POA: Diagnosis not present

## 2023-01-20 DIAGNOSIS — M5124 Other intervertebral disc displacement, thoracic region: Secondary | ICD-10-CM | POA: Diagnosis present

## 2023-01-20 DIAGNOSIS — I719 Aortic aneurysm of unspecified site, without rupture: Secondary | ICD-10-CM | POA: Diagnosis present

## 2023-01-20 DIAGNOSIS — E1122 Type 2 diabetes mellitus with diabetic chronic kidney disease: Secondary | ICD-10-CM | POA: Diagnosis present

## 2023-01-20 DIAGNOSIS — Z515 Encounter for palliative care: Secondary | ICD-10-CM | POA: Diagnosis not present

## 2023-01-20 DIAGNOSIS — S22080S Wedge compression fracture of T11-T12 vertebra, sequela: Secondary | ICD-10-CM | POA: Diagnosis not present

## 2023-01-20 DIAGNOSIS — I129 Hypertensive chronic kidney disease with stage 1 through stage 4 chronic kidney disease, or unspecified chronic kidney disease: Secondary | ICD-10-CM | POA: Diagnosis present

## 2023-01-20 DIAGNOSIS — I2510 Atherosclerotic heart disease of native coronary artery without angina pectoris: Secondary | ICD-10-CM | POA: Diagnosis present

## 2023-01-20 DIAGNOSIS — M8458XA Pathological fracture in neoplastic disease, other specified site, initial encounter for fracture: Secondary | ICD-10-CM | POA: Diagnosis present

## 2023-01-20 DIAGNOSIS — Z66 Do not resuscitate: Secondary | ICD-10-CM | POA: Diagnosis not present

## 2023-01-20 DIAGNOSIS — K8020 Calculus of gallbladder without cholecystitis without obstruction: Secondary | ICD-10-CM | POA: Diagnosis not present

## 2023-01-20 DIAGNOSIS — G9341 Metabolic encephalopathy: Secondary | ICD-10-CM | POA: Diagnosis present

## 2023-01-20 DIAGNOSIS — N400 Enlarged prostate without lower urinary tract symptoms: Secondary | ICD-10-CM | POA: Diagnosis present

## 2023-01-20 DIAGNOSIS — C7951 Secondary malignant neoplasm of bone: Secondary | ICD-10-CM | POA: Diagnosis present

## 2023-01-20 DIAGNOSIS — I10 Essential (primary) hypertension: Secondary | ICD-10-CM | POA: Diagnosis not present

## 2023-01-20 LAB — RENAL FUNCTION PANEL
Albumin: 2.7 g/dL — ABNORMAL LOW (ref 3.5–5.0)
Anion gap: 11 (ref 5–15)
BUN: 61 mg/dL — ABNORMAL HIGH (ref 8–23)
CO2: 21 mmol/L — ABNORMAL LOW (ref 22–32)
Calcium: 8.9 mg/dL (ref 8.9–10.3)
Chloride: 103 mmol/L (ref 98–111)
Creatinine, Ser: 2.44 mg/dL — ABNORMAL HIGH (ref 0.61–1.24)
GFR, Estimated: 25 mL/min — ABNORMAL LOW (ref >60)
Glucose, Bld: 110 mg/dL — ABNORMAL HIGH (ref 70–99)
Phosphorus: 3.8 mg/dL (ref 2.5–4.6)
Potassium: 4.2 mmol/L (ref 3.5–5.1)
Sodium: 135 mmol/L (ref 135–145)

## 2023-01-20 LAB — CBC
HCT: 36.1 % — ABNORMAL LOW (ref 39.0–52.0)
Hemoglobin: 11.6 g/dL — ABNORMAL LOW (ref 13.0–17.0)
MCH: 28.9 pg (ref 26.0–34.0)
MCHC: 32.1 g/dL (ref 30.0–36.0)
MCV: 90 fL (ref 80.0–100.0)
Platelets: 240 10*3/uL (ref 150–400)
RBC: 4.01 MIL/uL — ABNORMAL LOW (ref 4.22–5.81)
RDW: 13.4 % (ref 11.5–15.5)
WBC: 9.8 10*3/uL (ref 4.0–10.5)
nRBC: 0 % (ref 0.0–0.2)

## 2023-01-20 LAB — GLUCOSE, CAPILLARY
Glucose-Capillary: 91 mg/dL (ref 70–99)
Glucose-Capillary: 131 mg/dL — ABNORMAL HIGH (ref 70–99)
Glucose-Capillary: 131 mg/dL — ABNORMAL HIGH (ref 70–99)
Glucose-Capillary: 155 mg/dL — ABNORMAL HIGH (ref 70–99)

## 2023-01-20 LAB — PROTIME-INR
INR: 1.1 (ref 0.8–1.2)
Prothrombin Time: 14.7 s (ref 11.4–15.2)

## 2023-01-20 LAB — PSA: Prostatic Specific Antigen: 261.27 ng/mL — ABNORMAL HIGH (ref 0.00–4.00)

## 2023-01-20 LAB — MAGNESIUM: Magnesium: 2.1 mg/dL (ref 1.7–2.4)

## 2023-01-20 MED ORDER — SODIUM CHLORIDE 0.9 % IV SOLN: INTRAVENOUS | Status: DC

## 2023-01-20 MED ORDER — ENOXAPARIN SODIUM 30 MG/0.3ML IJ SOSY: 30.0000 mg | PREFILLED_SYRINGE | INTRAMUSCULAR | Status: DC

## 2023-01-20 MED ORDER — SODIUM CHLORIDE 0.9 % IV BOLUS
1000.0000 mL | Freq: Once | INTRAVENOUS | Status: AC
  Administered 2023-01-20: 1000 mL via INTRAVENOUS

## 2023-01-20 MED ORDER — ACETAMINOPHEN 500 MG PO TABS
1000.0000 mg | ORAL_TABLET | Freq: Three times a day (TID) | ORAL | Status: DC
  Administered 2023-01-20 – 2023-01-28 (×25): 1000 mg via ORAL
  Filled 2023-01-20 (×25): qty 2

## 2023-01-20 MED ORDER — ENOXAPARIN SODIUM 30 MG/0.3ML IJ SOSY
30.0000 mg | PREFILLED_SYRINGE | INTRAMUSCULAR | Status: DC
  Administered 2023-01-20: 30 mg via SUBCUTANEOUS
  Filled 2023-01-20: qty 0.3

## 2023-01-20 MED ORDER — GABAPENTIN 100 MG PO CAPS
200.0000 mg | ORAL_CAPSULE | Freq: Two times a day (BID) | ORAL | Status: DC
  Administered 2023-01-20 – 2023-01-22 (×5): 200 mg via ORAL
  Filled 2023-01-20 (×5): qty 2

## 2023-01-20 MED ORDER — GABAPENTIN 300 MG PO CAPS
300.0000 mg | ORAL_CAPSULE | Freq: Two times a day (BID) | ORAL | Status: DC
  Filled 2023-01-20: qty 1

## 2023-01-20 MED ORDER — OXYCODONE HCL 5 MG PO TABS
5.0000 mg | ORAL_TABLET | Freq: Three times a day (TID) | ORAL | Status: DC | PRN
  Administered 2023-01-23: 5 mg via ORAL
  Filled 2023-01-20: qty 1

## 2023-01-20 MED ORDER — HYDROMORPHONE HCL 1 MG/ML IJ SOLN: 0.5000 mg | INTRAMUSCULAR | Status: DC | PRN

## 2023-01-20 MED ORDER — ENSURE ENLIVE PO LIQD
237.0000 mL | Freq: Two times a day (BID) | ORAL | Status: DC
  Administered 2023-01-21 – 2023-01-28 (×13): 237 mL via ORAL

## 2023-01-20 NOTE — Plan of Care (Signed)

## 2023-01-20 NOTE — Progress Notes (Signed)
Physical Therapy Treatment Patient Details Name: William Faulkner MRN: [id number] DOB: 1934-04-27 Today's Date: 01/20/2023   History of Present Illness 88 y.o. male with medical history significant of benign essential tremor, BPH, cholelithiasis, type 2 diabetes, CAD/CABG, aortic aneurysm, arctic insufficiency, hyperlipidemia, hypertension, left rotator cuff tear, diabetic peripheral neuropathy bladder cancer, cerebrovascular disease, stage 3a CKD who presented with history of 2 months of back pain, nausea and weakness. Admitted on 01/18/23 with complaints of increased back pain. CT: Positive for subacute and pathologic appearing T12 compression fracture, with underlying lytic lesion and loss of height progressed since November. MRI: negative    PT Comments   Pt is pleasant and agreeable to PT, dtr present for session, very supportive.  with incr gait distance pt's gait deviations are exacerbated and pt with noted increased L lateral lean needing heavy mod assist to maintain standing end of distance.  pt also with  incr bil  knee flexion therefore  RN called to room to pull chair up to pt for safety. Pt reports pain as minimal but states he is very tired. Continue to recommend HHPT however if pt continues to require incr assistance, may need SNF at d/c unless family able to provide needed assist    If plan is discharge home, recommend the following: A lot of help with walking and/or transfers;A lot of help with bathing/dressing/bathroom   Can travel by private vehicle        Equipment Recommendations  Rolling walker (2 wheels)    Recommendations for Other Services       Precautions / Restrictions Precautions Precautions: Back Precaution Booklet Issued: No Precaution Comments: Verbal education provided on "BLT" back precautions during evaluation. Required Braces or Orthoses: Spinal Brace Spinal Brace: Thoracolumbosacral orthotic;Applied in sitting position Restrictions Weight Bearing  Restrictions Per Provider Order: No     Mobility  Bed Mobility Overal bed mobility: Needs Assistance Bed Mobility: Rolling, Sidelying to Sit Rolling: Min assist Sidelying to sit: Min assist, Mod assist, Used rails, HOB elevated (HOB at 30 degrees)       General bed mobility comments: cues for log roll, assist to elevate trunk, incr time    Transfers Overall transfer level: Needs assistance Equipment used: Rolling walker (2 wheels) Transfers: Sit to/from Stand Sit to Stand: From elevated surface, Min assist           General transfer comment: cues for hand placement, assist to power up and extend hips/trunk. pt tends to stand withrounded shoulders, head flexed, trunk flexed    Ambulation/Gait Ambulation/Gait assistance: Min assist, Mod assist, +2 safety/equipment Gait Distance (Feet): 170 Feet Assistive device: Rolling walker (2 wheels) Gait Pattern/deviations: Decreased step length - left, Decreased dorsiflexion - left, Knee flexed in stance - right, Knee flexed in stance - left, Knees buckling       General Gait Details: cues for posture, incr step length on L, position from RW and safety awareness. with incr distance gait deviations exacerbated and pt with increasing L lateral lean needed heavy mod assist to maintain standing pt also incr knee flexion, RN called to room to pull chair up to pt   Stairs             Wheelchair Mobility     Tilt Bed    Modified Rankin (Stroke Patients Only)       Balance   Sitting-balance support: Feet supported, Single extremity supported Sitting balance-Leahy Scale: Poor Sitting balance - Comments: repeated cues and intermittent assist for midline Postural control:  Posterior lean Standing balance support: Bilateral upper extremity supported, During functional activity, Reliant on assistive device for balance Standing balance-Leahy Scale: Poor                              Cognition Arousal:  Alert Behavior During Therapy: WFL for tasks assessed/performed Overall Cognitive Status: Within Functional Limits for tasks assessed                                          Exercises      General Comments        Pertinent Vitals/Pain Pain Assessment Pain Assessment: 0-10 Pain Score: 1  Pain Location: back Pain Descriptors / Indicators: Discomfort Pain Intervention(s): Limited activity within patient's tolerance, Monitored during session, Repositioned    Home Living                          Prior Function            PT Goals (current goals can now be found in the care plan section) Acute Rehab PT Goals Patient Stated Goal: Regain IND and return home PT Goal Formulation: With patient Time For Goal Achievement: 02/02/23 Potential to Achieve Goals: Good Progress towards PT goals: Progressing toward goals    Frequency    Min 1X/week      PT Plan      Co-evaluation              AM-PAC PT "6 Clicks" Mobility   Outcome Measure  Help needed turning from your back to your side while in a flat bed without using bedrails?: A Little Help needed moving from lying on your back to sitting on the side of a flat bed without using bedrails?: A Lot Help needed moving to and from a bed to a chair (including a wheelchair)?: A Lot Help needed standing up from a chair using your arms (e.g., wheelchair or bedside chair)?: A Little Help needed to walk in hospital room?: A Lot Help needed climbing 3-5 steps with a railing? : Total 6 Click Score: 13    End of Session Equipment Utilized During Treatment: Gait belt Activity Tolerance: Patient tolerated treatment well Patient left: in chair;with call bell/phone within reach;with chair alarm set;with family/visitor present Nurse Communication: Mobility status PT Visit Diagnosis: Unsteadiness on feet (R26.81);Muscle weakness (generalized) (M62.81);Difficulty in walking, not elsewhere classified  (R26.2);Pain Pain - part of body:  (back)     Time: 4098-1191 PT Time Calculation (min) (ACUTE ONLY): 32 min  Charges:    $Gait Training: 23-37 mins PT General Charges $$ ACUTE PT VISIT: 1 Visit                     Glendola Friedhoff, PT  Acute Rehab Dept ([facility]) [phone]  01/20/2023    [facility] 01/20/2023, 1:23 PM

## 2023-01-20 NOTE — Consult Note (Addendum)
[facility] ADMISSION NOTE  Patient Care Team: Wanda Plump, MD as PCP - General (Internal Medicine) [name] Madolyn Frieze, MD as PCP - Cardiology (Cardiology) [name] Madolyn Frieze, MD as Consulting Physician (Cardiology) Beverely Low, MD as Consulting Physician (Orthopedic Surgery) Sheran Luz, MD as Consulting Physician (Physical Medicine and Rehabilitation) Nelson Chimes, MD as Consulting Physician (Ophthalmology) Jannifer Hick, MD as Consulting Physician (Urology)   ASSESSMENT & PLAN:  88 year old male with history of aortic aneurysm, aortic insufficiency, BPH, CAD with bypass, remote history of NMIBC, hypertension, hyperlipidemia presenting for weakness.  Imaging found metastatic disease on the spine.  We are consulted for this reason.  PSA reported elevated.  Discussed with patient, and daughter Hilda Lias at the bedside.  Recommend to obtain diagnosis as soon as possible.  Once diagnosis is made, treatment can be provided.  Recommend IR CT-guided biopsy on one of the bone lesion.  PET scan can be possible.  We discussed treatment for prostate cancer is mostly tolerable.  Side effects can mostly be managed.  His aneurysm has not been an issue reported stable.  Bone lesions Suspicious for prostate primary. Recommend CT-guided biopsy  AKI on CKD Appear acutely decreased.  Increase fluid as able.  60 to 70 ounces per day  Constipation Stool softener, MiraLAX as needed Increase fiber upon discharge Increase fluid intake  History of low risk NMIBC History of aortic aneurysm  Recommend continue physical therapy.  All questions were answered.  Thank you for the consult.  Will follow with you.   Melven Sartorius, MD 01/20/2023 10:03 AM   CHIEF COMPLAINTS/PURPOSE OF ADMISSION Bone lesions  HISTORY OF PRESENTING ILLNESS:  [place] Manrique 88 y.o. male is admitted for unable to walk. Report history of low risk nonmuscle invasive bladder cancer, initially diagnosed in  2000. Cystoscopy by Dr. Kirtland Bouchard in December 2024 showed no bladder lesions, and obstructing prostate gland with intravesical protrusion.  He was evaluated for lower back pain.  Report PSA at the time on 12/24 was 181.  Daughter report patient has subacute onset of lower back pain that is worsening over the last few days.  He also has decreased appetite, some weight loss.  Otherwise, he was previously healthy, and active.  01/18/23 CTA IMPRESSION: 1. Negative for Aortic Dissection. Stable mild fusiform aneurysmal enlargement of the ascending aorta. Severe Aortic Atherosclerosis (ICD10-I70.0).   2. Positive for a subacute and pathologic appearing T12 compression fracture, with underlying lytic lesion and loss of height progressed since November. No retropulsion or other complicating features. In conjunction with November CT findings of skeletal metastatic disease, abnormal bladder base and prostate as detailed on that exam.   3. No other acute finding in the Chest, Abdomen, or Pelvis. Cholelithiasis.   01/18/23 MRI T Spine WO: IMPRESSION: 1. Multiple metastatic deposits throughout the thoracic spine as described. 2. No pathologic fractures. 3. Central disc protrusion at T8-9 effaces the ventral CSF. 4. Rightward disc protrusion and annular tear at T11-12 without focal stenosis.  Summary of oncologic history as follows: Oncology History   No history exists.    MEDICAL HISTORY:  Past Medical History:  Diagnosis Date   AORTIC ANEUR UNSPEC SITE WITHOUT MENTION RUPTURE    AORTIC INSUFFICIENCY    BPH (benign prostatic hyperplasia)    CAD, ARTERY BYPASS GRAFT    Cancer (HCC)    CEREBROVASCULAR DISEASE    DIAB W/O COMP TYPE II/UNS NOT STATED UNCNTRL    Hyperlipidemia    HYPERTENSION, ESSENTIAL NOS  NEOPLASM, MALIGNANT, BLADDER, HX OF    Rotator cuff tear    Left shoulder,     SURGICAL HISTORY: Past Surgical History:  Procedure Laterality Date   BACK SURGERY  2003   lower    BLADDER TUMOR EXCISION     Surgery x6, Dr.Tannebaum   BLEPHAROPLASTY  1980   bilaterally, Dr Nile Riggs   COLONOSCOPY W/ POLYPECTOMY     Dr Awanda [facility]   CORONARY ARTERY BYPASS GRAFT  2011   x 5   CYSTOSCOPY  09/02/2012   INGUINAL HERNIA REPAIR  10/24/2011   Procedure: HERNIA REPAIR INGUINAL ADULT;  Surgeon: Velora Heckler, MD;  Location: [facility];  Service: General;  Laterality: Right;  Repair Right Inguinal Hernia with Mesh, Umbilical Hernia Repair with Mesh   LUMBAR EPIDURAL INJECTION  2008   ROTATOR CUFF REPAIR  2009   right; Dr Simonne Come   SHOULDER ARTHROSCOPY WITH ROTATOR CUFF REPAIR AND SUBACROMIAL DECOMPRESSION  02/14/2014   Right, SAD/DCR, biceps tenodesis   UMBILICAL HERNIA REPAIR  10/24/2011   Procedure: HERNIA REPAIR UMBILICAL ADULT;  Surgeon: Velora Heckler, MD;  Location: [facility];  Service: General;  Laterality: N/A;    SOCIAL HISTORY: Social History   Socioeconomic History   Marital status: Married    Spouse name: Not on file   Number of children: 3   Years of education: Not on file   Highest education level: Not on file  Occupational History   Occupation: retired  Tobacco Use   Smoking status: Former    Current packs/day: 0.00    Average packs/day: 0.3 packs/day for 15.0 years (4.5 ttl pk-yrs)    Types: Cigarettes    Start date: 01/21/1969    Quit date: 01/22/1984    Years since quitting: 39.0   Smokeless tobacco: Never  Vaping Use   Vaping status: Never Used  Substance and Sexual Activity   Alcohol use: No    Alcohol/week: 0.0 standard drinks of alcohol    Comment: rare, wine    Drug use: No   Sexual activity: Not on file  Other Topics Concern   Not on file  Social History Narrative   8 G-children   Married x 64 years   Lives with wife in a one story home.  Has 3 children.     Retired from [employer].  Education: college.    Right handed   Drinks caffeine   One story home   Social Drivers of Health   Financial Resource Strain: Not on file  Food  Insecurity: No Food Insecurity (01/18/2023)   Hunger Vital Sign    Worried About Running Out of Food in the Last Year: Never true    Ran Out of Food in the Last Year: Never true  Transportation Needs: No Transportation Needs (01/18/2023)   PRAPARE - Administrator, Civil Service (Medical): No    Lack of Transportation (Non-Medical): No  Physical Activity: Not on file  Stress: Not on file  Social Connections: Not on file  Intimate Partner Violence: Not At Risk (01/18/2023)   Humiliation, Afraid, Rape, and Kick questionnaire    Fear of Current or Ex-Partner: No    Emotionally Abused: No    Physically Abused: No    Sexually Abused: No    FAMILY HISTORY: Family History  Problem Relation Age of Onset   Diabetes Mother    Tremor Father    Other Sister    Stroke Neg Hx    Heart disease Neg Hx  Cancer Neg Hx     ALLERGIES:  is allergic to atorvastatin, celecoxib, sulfonamide derivatives, and zolpidem tartrate.  MEDICATIONS:  Current Facility-Administered Medications  Medication Dose Route Frequency Provider Last Rate Last Admin   0.9 %  sodium chloride infusion   Intravenous Continuous Alanda Slim, Taye T, MD       acetaminophen (TYLENOL) tablet 650 mg  650 mg Oral Q6H PRN Bobette Mo, MD       Or   acetaminophen (TYLENOL) suppository 650 mg  650 mg Rectal Q6H PRN Bobette Mo, MD       aspirin EC tablet 81 mg  81 mg Oral Daily Bobette Mo, MD   81 mg at 01/19/23 4098   cholecalciferol (VITAMIN D3) 25 MCG (1000 UNIT) tablet 1,000 Units  1,000 Units Oral Daily Bobette Mo, MD   1,000 Units at 01/19/23 0823   enoxaparin (LOVENOX) injection 30 mg  30 mg Subcutaneous Q24H LillistonLoleta Rose, RPH       gabapentin (NEURONTIN) capsule 300 mg  300 mg Oral BID Gonfa, Taye T, MD       HYDROmorphone (DILAUDID) injection 0.5 mg  0.5 mg Intravenous Q4H PRN Gonfa, Taye T, MD       insulin aspart (novoLOG) injection 0-9 Units  0-9 Units Subcutaneous TID WC  Almon Hercules, MD       naloxone [facility]) injection 0.4 mg  0.4 mg Intravenous PRN Bobette Mo, MD       nystatin (MYCOSTATIN/NYSTOP) topical powder   Topical TID Bobette Mo, MD   Given at 01/19/23 2131   ondansetron [facility]) tablet 4 mg  4 mg Oral Q6H PRN Bobette Mo, MD       Or   ondansetron [facility]) injection 4 mg  4 mg Intravenous Q6H PRN Bobette Mo, MD   4 mg at 01/19/23 1191   oxyCODONE-acetaminophen (PERCOCET/ROXICET) 5-325 MG per tablet 1 tablet  1 tablet Oral Q8H PRN Bobette Mo, MD   1 tablet at 01/20/23 0741   polyethylene glycol (MIRALAX / GLYCOLAX) packet 17 g  17 g Oral Daily Bobette Mo, MD   17 g at 01/19/23 4782   propranolol (INDERAL) tablet 40 mg  40 mg Oral Daily Bobette Mo, MD   40 mg at 01/19/23 9562   rosuvastatin (CRESTOR) tablet 20 mg  20 mg Oral Daily Bobette Mo, MD   20 mg at 01/19/23 1308   timolol (TIMOPTIC) 0.5 % ophthalmic solution 1 drop  1 drop Both Eyes q morning Bobette Mo, MD   1 drop at 01/19/23 6578    REVIEW OF SYSTEMS:   Constitutional: Denies fevers, chills + weight loss and decreased appetite Respiratory: Denies cough, shortness of breath Cardiovascular: Denies pressure, chest discomfort or chest pain Gastrointestinal:  Denies nausea, vomiting, diarrhea, heartburn or abdominal pain GU: Denies any dysuria, frequency, hematuria Neurological: Denies numbness, tingling or new weaknesses All other systems were reviewed with the patient and are negative.  PHYSICAL EXAMINATION: ECOG PERFORMANCE STATUS: 1 - Symptomatic but completely ambulatory  Vitals:   01/19/23 2052 01/20/23 0520  BP: (!) 109/42 (!) 121/43  Pulse: 66 83  Resp: 16 16  Temp: 97.6 F (36.4 C) 97.6 F (36.4 C)  SpO2: 93% 93%   Filed Weights   01/18/23 0720  Weight: 184 lb (83.5 kg)    GENERAL:alert, no distress and comfortable SKIN: skin color normal. No jaundice EYES: normal, sclera clear NECK:  supple. No  mass LYMPH:  no palpable cervical, axillary or inguinal lymphadenopathy LUNGS: clear to auscultation and normal breathing effort.  No wheeze or rales HEART: regular rate & rhythm and no murmurs ABDOMEN:abdomen soft, non-tender and normal bowel sounds Musculoskeletal:  no lower extremity edema NEURO: alert & oriented x 3 with fluent speech; no focal motor/sensory deficits Strength and sensation equal bilaterally  LABORATORY DATA:  I have reviewed the data as listed Lab Results  Component Value Date   WBC 9.8 01/20/2023   HGB 11.6 (L) 01/20/2023   HCT 36.1 (L) 01/20/2023   MCV 90.0 01/20/2023   PLT 240 01/20/2023   Recent Labs    09/18/22 1102 01/18/23 0822 01/20/23 0321  NA 139 139 135  K 4.8 4.3 4.2  CL 103 105 103  CO2 31 21* 21*  GLUCOSE 120* 128* 110*  BUN 31* 38* 61*  CREATININE 1.55* 1.47* 2.44*  CALCIUM 9.7 10.0 8.9  GFRNONAA  --  46* 25*  PROT  --  7.6  --   ALBUMIN  --  3.6 2.7*  AST  --  22  --   ALT  --  16  --   ALKPHOS  --  85  --   BILITOT  --  0.9  --     RADIOGRAPHIC STUDIES: I have personally reviewed the radiological images as listed and agreed with the findings in the report. MR THORACIC SPINE WO CONTRAST Result Date: 01/18/2023 CLINICAL DATA:  Bladder cancer. Evaluate for metastatic disease to the thoracic spine. Chronic back pain. Weakness beginning approximately 1 month ago. EXAM: MRI THORACIC SPINE WITHOUT CONTRAST TECHNIQUE: Multiplanar, multisequence MR imaging of the thoracic spine was performed. No intravenous contrast was administered. COMPARISON:  CT angio chest, abdomen and pelvis 01/18/2023. FINDINGS: Alignment: No significant listhesis is present. Mild rightward curvature is centered at T8-9. Thoracic kyphosis is intact. Vertebrae: Multiple metastatic deposits are present throughout the thoracic spine. A 6 mm lesion is present within the right pedicle of T1. An 8 mm lesion is present posterior elements of T2. Anterior lesion at T2  measures 13 mm. Metastatic lesions are present within the T3 vertebral body and the spinous process of T3. A right superior lesion at T5 extends to the pedicle. Rounded metastases at T7 measure 10 and 11 mm. Larger metastases at T8 extend from the superior to inferior endplate, 18 mm. Similar larger lesions are present throughout the remainder of the lower thoracic spine. A right T11 lesion extends into the right pedicle. Diffuse metastatic infiltration is present in the T12 vertebral body. No pathologic fractures are present. Extensive infiltration of the L1 vertebral body is present. Cord:  Normal signal and morphology. Paraspinal and other soft tissues: See CT report from the same day for further description of metastatic disease outside the spine. Disc levels: A central disc protrusion at T8-9 effaces the ventral CSF. The foramina are patent bilaterally. No significant disc disease or stenosis is present above this level. T9-10: Negative. T10-11: Negative. T11-12: A rightward disc protrusion and annular tear is present without focal stenosis. T12-L1: Negative. IMPRESSION: 1. Multiple metastatic deposits throughout the thoracic spine as described. 2. No pathologic fractures. 3. Central disc protrusion at T8-9 effaces the ventral CSF. 4. Rightward disc protrusion and annular tear at T11-12 without focal stenosis. Electronically Signed   By: Marin Roberts M.D.   On: 01/18/2023 19:20   CT Angio Chest/Abd/Pel for Dissection W and/or Wo Contrast Result Date: 01/18/2023 CLINICAL DATA:  88 year old male with pain, weakness. Known  mild aneurysmal dilatation of the ascending thoracic aorta. Bladder cancer. EXAM: CT ANGIOGRAPHY CHEST, ABDOMEN AND PELVIS TECHNIQUE: Non-contrast CT of the chest was initially obtained. Multidetector CT imaging through the chest, abdomen and pelvis was performed using the standard protocol during bolus administration of intravenous contrast. Multiplanar reconstructed images and MIPs  were obtained and reviewed to evaluate the vascular anatomy. RADIATION DOSE REDUCTION: This exam was performed according to the departmental dose-optimization program which includes automated exposure control, adjustment of the mA and/or kV according to patient size and/or use of iterative reconstruction technique. CONTRAST:  75mL OMNIPAQUE IOHEXOL 350 MG/ML SOLN COMPARISON:  CTA chest 08/02/2022 and earlier. Restaging CT Abdomen and Pelvis 12/11/2022 FINDINGS: CTA CHEST FINDINGS Cardiovascular: Previous CABG. Extensive Calcified aortic atherosclerosis. Tortuous ascending aorta is stable, smoothly tapers in the arch. Negative for thoracic aortic dissection, periaortic hematoma. Heart size is stable, within normal limits. No pericardial effusion. Little contrast in the pulmonary arteries. Proximal great vessel atherosclerosis appears stable since July. Mediastinum/Nodes: Negative for mediastinal hematoma, mass, lymphadenopathy. Lungs/Pleura: Stable lung volumes since July. Patchy lung base atelectasis or scarring has not significantly changed. No pleural effusion. Small volume new retained secretions in the trachea just below the thoracic inlet on series 4 image 30. But elsewhere the major airways are patent. No active pulmonary inflammation is identified. Musculoskeletal: Chronic sternotomy. Chronic right 2nd rib deformity. T1 through T11 thoracic vertebrae appear stable since July. T12 compression fracture with mild comminution is new since July but was present, subtle in November when other skeletal metastatic disease was suspected. Central T12 loss of vertebral body height up to 30% (series 6, image 105). Lucency within the T12 body appears progressed since July. No retropulsion. T12 pedicles and posterior elements appear intact and aligned. There is mild anterior and lateral prevertebral soft tissue or edema which is increased since November on series 11, image 56. No epidural tumor by CT. Review of the MIP  images confirms the above findings. CTA ABDOMEN AND PELVIS FINDINGS VASCULAR Aortoiliac calcified atherosclerosis. Extensive aortic and branch calcified atherosclerosis but the major arterial structures in the abdomen and pelvis remain patent. Negative for abdominal aortic dissection or aneurysm. Review of the MIP images confirms the above findings. NON-VASCULAR Hepatobiliary: Stable liver. Cholelithiasis. No gallbladder inflammation by CT. Pancreas: Diminutive. Spleen: Diminutive. Adrenals/Urinary Tract: Normal adrenal glands. Nonobstructed kidneys with symmetric renal enhancement. No hydroureter. Abnormal prostate at the base of the bladder as below. Diminutive bladder similar to the November CT. Stomach/Bowel: Decompressed large bowel from the splenic flexure distally but transverse colon is redundant with moderate retained gas and stool, increased since November. Low-density retained stool in the right colon. Cecum is on a lax mesentery. No dilated small bowel. No large bowel inflammation identified. Stomach and duodenum appear negative. No free air or free fluid. Lymphatic: No lymphadenopathy identified in the abdomen or pelvis. Reproductive: Abnormally heterogeneous, lobulated, masslike appearance of the prostate on series 10, image 203 not significantly changed from the restaging CT last month (please see that report) Other: No pelvis free fluid. Musculoskeletal: Lumbar vertebrae appear stable since November. Medial left iliac sclerotic bone lesions have not significantly changed. Sacrum and SI joints appear intact. Pelvis and proximal femurs appear to remain intact. Review of the MIP images confirms the above findings. IMPRESSION: 1. Negative for Aortic Dissection. Stable mild fusiform aneurysmal enlargement of the ascending aorta. Severe Aortic Atherosclerosis (ICD10-I70.0). 2. Positive for a subacute and pathologic appearing T12 compression fracture, with underlying lytic lesion and loss of height  progressed  since November. No retropulsion or other complicating features. In conjunction with November CT findings of skeletal metastatic disease, abnormal bladder base and prostate as detailed on that exam. 3. No other acute finding in the Chest, Abdomen, or Pelvis. Cholelithiasis. Electronically Signed   By: Odessa Fleming M.D.   On: 01/18/2023 10:37

## 2023-01-20 NOTE — TOC Initial Note (Signed)
Transition of Care [facility]) - Initial/Assessment Note   Patient Details  Name: William Faulkner MRN: [id number] Date of Birth: August 22, 1934  [facility]) CM/SW Contact:    Ewing Schlein, LCSW Phone Number: 01/20/2023, 2:42 PM  Clinical Narrative: PT/OT evaluations recommended HH. Spouse and daughter are agreeable and confirmed the patient already has a rolling walker at home. Family had no agency preference. HH referral made to Cindie with [facility], which was accepted. CSW updated family.  Expected Discharge Plan: Home w Home Health Services Barriers to Discharge: Continued Medical Work up  Patient Goals and CMS Choice Patient states their goals for this hospitalization and ongoing recovery are:: Get [facility] in the home CMS Medicare.gov Compare Post Acute Care list provided to:: Patient Represenative (must comment) Choice offered to / list presented to : Spouse, Adult Children  Expected Discharge Plan and Services In-house Referral: Clinical Social Work Post Acute Care Choice: Home Health Living arrangements for the past 2 months: Single Family Home          DME Arranged: N/A DME Agency: NA HH Arranged: PT, OT HH AgencyHotel manager Home Health Care Date [facility] Agency Contacted: 01/20/23 Time HH Agency Contacted: 1355 Representative spoke with at [facility] Agency: Cindie  Prior Living Arrangements/Services Living arrangements for the past 2 months: Single Family Home Lives with:: Spouse Patient language and need for interpreter reviewed:: Yes Do you feel safe going back to the place where you live?: Yes      Need for Family Participation in Patient Care: Yes (Comment) Care giver support system in place?: Yes (comment) Current home services: DME (Has rolling walker at home) Criminal Activity/Legal Involvement Pertinent to Current Situation/Hospitalization: No - Comment as needed  Activities of Daily Living ADL Screening (condition at time of admission) Independently performs ADLs?: Yes  (appropriate for developmental age) Is the patient deaf or have difficulty hearing?: No Does the patient have difficulty seeing, even when wearing glasses/contacts?: No Does the patient have difficulty concentrating, remembering, or making decisions?: No  Permission Sought/Granted Permission sought to share information with : Other (comment) Permission granted to share information with : Yes, Verbal Permission Granted Permission granted to share info w AGENCY: HH agencies  Emotional Assessment Orientation: : Oriented to Self, Oriented to Place, Oriented to  Time, Oriented to Situation Alcohol / Substance Use: Not Applicable Psych Involvement: No (comment)  Admission diagnosis:  T12 compression fracture, initial encounter (HCC) [S22.080A] Compression fracture of T12 vertebra, initial encounter (HCC) [S22.080A] AKI (acute kidney injury) (HCC) [N17.9] Patient Active Problem List   Diagnosis Date Noted   AKI (acute kidney injury) (HCC) 01/20/2023   Bulge of thoracic disc without myelopathy 01/19/2023   Elevated PSA 01/19/2023   T12 compression fracture, initial encounter (HCC) 01/18/2023   Metastasis to bone (HCC) 01/18/2023   RBBB 12/21/2019   CRI (chronic renal insufficiency), stage 3 (moderate) (HCC) 12/21/2019   Chronic back pain 11/21/2016   Neuropathy 11/21/2016   Benign essential tremor 07/11/2016   Follow-up -------------PCP NOTES 09/28/2014   Dizziness 08/10/2014   Cholelithiasis 03/24/2014   Annual physical exam 11/01/2013   Tremor 03/03/2013   Cervical spondylosis 12/17/2012   Inguinal hernia unilateral, non-recurrent, right 08/26/2011   Aortic aneurysm (HCC) 01/03/2010   Hx of CABG 06/20/2009   ANGINA DECUBITUS 06/02/2009   Cerebrovascular disease 06/02/2009   Diabetes and neuropathy 03/14/2008   NEOPLASM, MALIGNANT, BLADDER, HX OF 12/21/2007   BPH (benign prostatic hyperplasia) 01/12/2007   Hyperlipidemia 12/17/2006   Essential hypertension 12/17/2006  Aortic  valve disorder 08/18/2006   PCP:  Wanda Plump, MD Pharmacy:   [facility] [address] [place], [place] - [address] [place] [address] Phone: [phone] Fax: [phone]  [facility] Delivery - [place], [place] - [address] [place] [address] Phone: [phone] Fax: [phone]  [facility] [address] [place], [place] - [address] AT [facility] [address] [place]. [place] [address] Phone: [phone] Fax: [phone]  Social Drivers of Health (SDOH) Social History: SDOH Screenings   Food Insecurity: No Food Insecurity (01/18/2023)  Housing: Low Risk  (01/18/2023)  Transportation Needs: No Transportation Needs (01/18/2023)  Utilities: Not At Risk (01/18/2023)  Depression (PHQ2-9): Low Risk  (09/18/2022)  Social Connections: Moderately Isolated (01/20/2023)  Tobacco Use: Medium Risk (01/18/2023)   SDOH Interventions:    Readmission Risk Interventions     No data to display

## 2023-01-20 NOTE — Progress Notes (Signed)
PROGRESS NOTE  William Faulkner ZOX:[id number] DOB: 1935/01/14   PCP: Wanda Plump, MD  Patient is from: Home.  Lives with his wife.  Uses cane at times.  DOA: 01/18/2023 LOS: 0  Chief complaints Chief Complaint  Patient presents with   Back Pain   Nausea   Weakness     Brief Narrative / Interim history: 88 year old M with PMH of CAD/CABG, thoracic cord aneurysm, aortic insufficiency, CKD-3A, CVA, HTN, HLD, bladder cancer and recent diagnosis of vertebral metastasis for which she is followed by orthopedic surgery, presenting with progressive back pain for about 2 months.  No report of trauma or fall.  In ED, CT angio chest, abdomen and pelvis raise concern for subacute and pathologic T12 compression fracture without retropulsion or other complicating features, underlying lytic lesion and progressive loss of height, abnormal bladder base and prostate .  [facility], Dr. Shon Baton consulted by EDP.  MRI thoracic spine obtained and showed multiple metastatic deposits throughout the thoracic spine, central disc protrusion at T8-9 without canal or foraminal stenosis, rightward disc protrusion and annular tear at T11-12 without focal stenosis but no pathologic fractures.  Oncology, urology and palliative consulted per recommendation by Ortho.  Oncology recommended IR biopsy.   Patient with AKI likely due to poor p.o. intake, ACE inhibitors and thiazide.  On IV fluid.  Subjective: Seen and examined earlier this morning.  No major events overnight of this morning.  Pain is fairly controlled.  He rates his pain 4-5 on a scale of 10.  He became sleepy and difficult to arouse after pain medication yesterday and this morning.  Per daughter, he improved after IV fluid.  He has been getting gabapentin 600 mg twice daily.  Now with AKI.  Objective: Vitals:   01/19/23 0957 01/19/23 1654 01/19/23 2052 01/20/23 0520  BP: (!) 108/56 (!) 108/43 (!) 109/42 (!) 121/43  Pulse: 74 63 66 83  Resp: 16 18 16 16    Temp: 98 F (36.7 C) 97.9 F (36.6 C) 97.6 F (36.4 C) 97.6 F (36.4 C)  TempSrc:  Oral Oral   SpO2: 95% 93% 93% 93%  Weight:      Height:        Examination:  GENERAL: No apparent distress.  Nontoxic. HEENT: MMM.  Vision and hearing grossly intact.  NECK: Supple.  No apparent JVD.  RESP:  No IWOB.  Fair aeration bilaterally. CVS:  RRR. Heart sounds normal.  ABD/GI/GU: BS+. Abd soft, NTND.  MSK/EXT:  Moves extremities. No apparent deformity. No edema.  TLSO brace in place. SKIN: no apparent skin lesion or wound NEURO: Awake but not quite alert.  Oriented x 4.  No apparent focal neuro deficit. PSYCH: Calm. Normal affect.   Procedures:  None  Microbiology summarized: None  Assessment and plan: Acute on chronic back pain: CT raise concern for pathologic T12 compression fracture.  However, MRI ruled out compression fracture but concerning for multiple mets, central disc protrusion at T8-9 and right Ward disc protrusion and abnormality at T11-12 without focal stenosis or complicating feature likely contributing to patient's pain.  No focal neurosymptoms or bowel or bladder habit change.  Pain improved.  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  No pathologic fracture.  Unknown primary.  Patient has history of bladder cancer.  CT shows abnormal bladder base and prostate. -Oncology consulted IR for bone biopsy. -Treat input by urology-outpatient follow-up for PET that needs to be postponed  AKI on CKD-3A: Likely due to poor p.o. intake, ACE inhibitor's and HCTZ. Recent Labs     09/18/22 1102 01/18/23 0822 01/20/23 0321  BUN 31* 38* 61*  CREATININE 1.55* 1.47* 2.44*  -Check renal ultrasound -NS bolus 1 L followed by maintenance -Strict intake and output and bladder scan -Discontinued ACE inhibitor's and HCTZ -Recheck in the morning   Acute metabolic encephalopathy: Likely due to dehydration and gabapentin.  He has not required a lot of opiates.  Has no focal neurodeficit.  Improved. -Adjusted pain medications including gabapentin -Reorientation and delirium precaution -IV fluid hydration  DM-2 with hyperglycemia, neuropathy, hyperlipidemia and CKD-3A: A1c 6.5% on 8/28. Recent Labs  Lab 01/19/23 1119 01/19/23 1644 01/19/23 2055 01/20/23 0730 01/20/23 1142  GLUCAP 218* 136* 126* 91 131*  -Discontinue metformin given his poor renal function -Start SSI-sensitive -Decrease gabapentin  History of CAD/CABG: No cardiopulmonary symptoms.  Stable -Continue home meds  Essential hypertension: Soft BP but improved with IV fluid. -Hold Lotensin and HCTZ. -Continue holding hydralazine -Continue propranolol for now     Benign essential tremor -Continue propranolol as above.   Glaucoma -Continue timolol drops. -Follow-up with ophthalmology as an outpatient.   Body mass index is 23.62 kg/m.         DVT prophylaxis:  enoxaparin (LOVENOX) injection 30 mg Start: 01/20/23 1200  Code Status: Full code Family Communication: Updated patient's daughter at bedside. Level of care: Med-Surg Status is: Observation The patient will require care spanning > 2 midnights and should be moved to inpatient because: Acute back pain, AKI and encephalopathy   Final disposition: Likely home once medically cleared Consultants:  Orthopedic surgery Oncology Urology Palliative medicine  55 minutes with more than 50% spent in reviewing records, counseling patient/family and coordinating care.   Sch Meds:  Scheduled Meds:  acetaminophen  1,000 mg Oral Q8H    aspirin EC  81 mg Oral Daily   cholecalciferol  1,000 Units Oral Daily   enoxaparin (LOVENOX) injection  30 mg Subcutaneous Q24H   gabapentin  200 mg Oral BID   insulin aspart  0-9 Units Subcutaneous TID WC   nystatin   Topical TID   polyethylene glycol  17 g Oral Daily   propranolol  40 mg Oral Daily   rosuvastatin  20 mg Oral Daily   timolol  1 drop Both Eyes q morning   Continuous Infusions:  sodium chloride 100 mL/hr at 01/20/23 1303   PRN Meds:.HYDROmorphone (DILAUDID) injection, naLOXone (NARCAN)  injection, ondansetron **OR** ondansetron (ZOFRAN) IV, oxyCODONE  Antimicrobials: Anti-infectives (From admission, onward)    None        I have personally reviewed the following labs and images: CBC: Recent Labs  Lab 01/18/23 0822 01/20/23 0321  WBC 7.6 9.8  NEUTROABS 5.9  --   HGB 14.4 11.6*  HCT 46.0 36.1*  MCV 91.1 90.0  PLT 299 240   BMP &GFR Recent Labs  Lab 01/18/23 0822 01/20/23 0321  NA 139 135  K 4.3 4.2  CL 105 103  CO2 21* 21*  GLUCOSE 128* 110*  BUN 38* 61*  CREATININE 1.47* 2.44*  CALCIUM 10.0 8.9  MG  --  2.1  PHOS  --  3.8   Estimated Creatinine Clearance: 25.3  mL/min (A) (by C-G formula based on SCr of 2.44 mg/dL (H)). Liver & Pancreas: Recent Labs  Lab 01/18/23 0822 01/20/23 0321  AST 22  --   ALT 16  --   ALKPHOS 85  --   BILITOT 0.9  --   PROT 7.6  --   ALBUMIN 3.6 2.7*   Recent Labs  Lab 01/18/23 0822  LIPASE 28   No results for input(s): "AMMONIA" in the last 168 hours. Diabetic: No results for input(s): "HGBA1C" in the last 72 hours. Recent Labs  Lab 01/19/23 1119 01/19/23 1644 01/19/23 2055 01/20/23 0730 01/20/23 1142  GLUCAP 218* 136* 126* 91 131*   Cardiac Enzymes: No results for input(s): "CKTOTAL", "CKMB", "CKMBINDEX", "TROPONINI" in the last 168 hours. No results for input(s): "PROBNP" in the last 8760 hours. Coagulation Profile: No results for input(s): "INR", "PROTIME" in the last 168 hours. Thyroid  Function Tests: No results for input(s): "TSH", "T4TOTAL", "FREET4", "T3FREE", "THYROIDAB" in the last 72 hours. Lipid Profile: No results for input(s): "CHOL", "HDL", "LDLCALC", "TRIG", "CHOLHDL", "LDLDIRECT" in the last 72 hours. Anemia Panel: No results for input(s): "VITAMINB12", "FOLATE", "FERRITIN", "TIBC", "IRON", "RETICCTPCT" in the last 72 hours. Urine analysis:    Component Value Date/Time   COLORURINE YELLOW 01/18/2023 0822   APPEARANCEUR CLEAR 01/18/2023 0822   LABSPEC 1.017 01/18/2023 0822   PHURINE 5.0 01/18/2023 0822   GLUCOSEU NEGATIVE 01/18/2023 0822   GLUCOSEU NEGATIVE 03/16/2020 1200   HGBUR MODERATE (A) 01/18/2023 0822   HGBUR negative 05/20/2008 1620   BILIRUBINUR NEGATIVE 01/18/2023 0822   BILIRUBINUR Negative 03/16/2020 1138   KETONESUR 5 (A) 01/18/2023 0822   PROTEINUR 30 (A) 01/18/2023 0822   UROBILINOGEN 0.2 03/16/2020 1200   UROBILINOGEN 0.2 03/16/2020 1138   NITRITE NEGATIVE 01/18/2023 0822   LEUKOCYTESUR NEGATIVE 01/18/2023 0822   Sepsis Labs: Invalid input(s): "PROCALCITONIN", "LACTICIDVEN"  Microbiology: No results found for this or any previous visit (from the past 240 hours).  Radiology Studies: No results found.     Josiah Nieto T. Wanza Szumski [facility]  If 7PM-7AM, please contact night-coverage [email or web address] 01/20/2023, 1:43 PM

## 2023-01-20 NOTE — Consult Note (Signed)
Consultation Note Date: 01/20/2023   Patient Name: William Faulkner  DOB: [birth year]  MRN: [id number]  Age / Sex: 88 y.o., male  PCP: Wanda Plump, MD Referring Physician: Almon Hercules, MD  Reason for Consultation: Establishing goals of care  HPI/Patient Profile: 88 y.o. male  admitted on 01/18/2023   Clinical Assessment and Goals of Care:  88 year old male who lives at home with his wife in [place], [place], son lives nearby, with history of aortic aneurysm, aortic insufficiency, BPH, CAD with bypass, remote history of bladder cancer, hypertension, hyperlipidemia presenting for weakness.  Imaging found metastatic disease on the spine.  Medical Oncology consulted.  PSA reported elevated.   Patient has  Bone lesions suspicious for prostate primary.  Patient to undergo IR CT-guided biopsy on one of the bone lesion.  Palliative consult for goals of care discussions.  Patient resting comfortably in bed, daughter from out of town is at bedside.  Palliative medicine is specialized medical care for people living with serious illness. It focuses on providing relief from the symptoms and stress of a serious illness. The goal is to improve quality of life for both the patient and the family. Goals of care: Broad aims of medical therapy in relation to the patient's values and preferences. Our aim is to provide medical care aimed at enabling patients to achieve the goals that matter most to them, given the circumstances of their particular medical situation and their constraints.    NEXT OF KIN  Lives at home with wife, son lives next door, has 2 daughters.   SUMMARY OF RECOMMENDATIONS    Full Code for now.  Monitor hospital course, biopsy results Recommend outpatient palliative support Recommend palliative follow up at [facility] based on biopsy results Thank you for the consult.   Code Status/Advance Care  Planning: Full code   Symptom Management:   Continue PO Percocet for now.   Palliative Prophylaxis:  Delirium Protocol  Additional Recommendations (Limitations, Scope, Preferences): Full Scope Treatment  Psycho-social/Spiritual:  Desire for further Chaplaincy support:yes Additional Recommendations: Caregiving  Support/Resources  Prognosis:  Unable to determine  Discharge Planning: To Be Determined      Primary Diagnoses: Present on Admission:  Essential hypertension  Benign essential tremor  Hyperlipidemia  Cholelithiasis  T12 compression fracture, initial encounter (HCC)  Metastasis to bone (HCC)  Cerebrovascular disease  CRI (chronic renal insufficiency), stage 3 (moderate) (HCC)  AKI (acute kidney injury) (HCC)   I have reviewed the medical record, interviewed the patient and family, and examined the patient. The following aspects are pertinent.  Past Medical History:  Diagnosis Date   AORTIC ANEUR UNSPEC SITE WITHOUT MENTION RUPTURE    AORTIC INSUFFICIENCY    BPH (benign prostatic hyperplasia)    CAD, ARTERY BYPASS GRAFT    Cancer (HCC)    CEREBROVASCULAR DISEASE    DIAB W/O COMP TYPE II/UNS NOT STATED UNCNTRL    Hyperlipidemia    HYPERTENSION, ESSENTIAL NOS    NEOPLASM, MALIGNANT, BLADDER, HX OF    Rotator cuff  tear    Left shoulder,    Social History   Socioeconomic History   Marital status: Married    Spouse name: Not on file   Number of children: 3   Years of education: Not on file   Highest education level: Not on file  Occupational History   Occupation: retired  Tobacco Use   Smoking status: Former    Current packs/day: 0.00    Average packs/day: 0.3 packs/day for 15.0 years (4.5 ttl pk-yrs)    Types: Cigarettes    Start date: 01/21/1969    Quit date: 01/22/1984    Years since quitting: 39.0   Smokeless tobacco: Never  Vaping Use   Vaping status: Never Used  Substance and Sexual Activity   Alcohol use: No    Alcohol/week: 0.0 standard  drinks of alcohol    Comment: rare, wine    Drug use: No   Sexual activity: Not on file  Other Topics Concern   Not on file  Social History Narrative   8 G-children   Married x 64 years   Lives with wife in a one story home.  Has 3 children.     Retired from [employer].  Education: college.    Right handed   Drinks caffeine   One story home   Social Drivers of Health   Financial Resource Strain: Not on file  Food Insecurity: No Food Insecurity (01/18/2023)   Hunger Vital Sign    Worried About Running Out of Food in the Last Year: Never true    Ran Out of Food in the Last Year: Never true  Transportation Needs: No Transportation Needs (01/18/2023)   PRAPARE - Administrator, Civil Service (Medical): No    Lack of Transportation (Non-Medical): No  Physical Activity: Not on file  Stress: Not on file  Social Connections: Moderately Isolated (01/20/2023)   Social Connection and Isolation Panel [NHANES]    Frequency of Communication with Friends and Family: More than three times a week    Frequency of Social Gatherings with Friends and Family: More than three times a week    Attends Religious Services: Never    Database administrator or Organizations: No    Attends Engineer, structural: Never    Marital Status: Married   Family History  Problem Relation Age of Onset   Diabetes Mother    Tremor Father    Other Sister    Stroke Neg Hx    Heart disease Neg Hx    Cancer Neg Hx    Scheduled Meds:  acetaminophen  1,000 mg Oral Q8H   aspirin EC  81 mg Oral Daily   cholecalciferol  1,000 Units Oral Daily   enoxaparin (LOVENOX) injection  30 mg Subcutaneous Q24H   gabapentin  200 mg Oral BID   insulin aspart  0-9 Units Subcutaneous TID WC   nystatin   Topical TID   polyethylene glycol  17 g Oral Daily   propranolol  40 mg Oral Daily   rosuvastatin  20 mg Oral Daily   timolol  1 drop Both Eyes q morning   Continuous Infusions:  sodium chloride 100 mL/hr at  01/20/23 1303   PRN Meds:.HYDROmorphone (DILAUDID) injection, naLOXone (NARCAN)  injection, ondansetron **OR** ondansetron (ZOFRAN) IV, oxyCODONE Medications Prior to Admission:  Prior to Admission medications   Medication Sig Start Date End Date Taking? Authorizing Provider  aspirin 81 MG tablet Take 81 mg by mouth daily.   Yes [provider]  augmented betamethasone dipropionate (DIPROLENE-AF) 0.05 % cream Apply topically 2 (two) times daily. 09/18/22  Yes Paz, Nolon Rod, MD  benazepril (LOTENSIN) 10 MG tablet TAKE 1 TABLET BY MOUTH DAILY 12/26/22  Yes Lewayne Bunting, MD  cholecalciferol (VITAMIN D) 1000 UNITS tablet Take 1,000 Units by mouth daily.   Yes [provider]  gabapentin (NEURONTIN) 600 MG tablet TAKE 1 TABLET BY MOUTH 3 TIMES  DAILY Patient taking differently: Take 600 mg by mouth in the morning and at bedtime. 10/28/22  Yes Worthy Rancher B, FNP  hydrALAZINE (APRESOLINE) 50 MG tablet TAKE 1 TABLET BY MOUTH TWICE  DAILY 12/04/22  Yes Lewayne Bunting, MD  hydrochlorothiazide (HYDRODIURIL) 25 MG tablet TAKE 1 TABLET BY MOUTH DAILY 09/02/22  Yes Lewayne Bunting, MD  ketoconazole (NIZORAL) 2 % cream Apply 1 application topically daily. 07/19/19  Yes Wanda Plump, MD  metFORMIN (GLUCOPHAGE) 850 MG tablet Take 1 tablet (850 mg total) by mouth 2 (two) times daily with a meal. 06/14/22  Yes Paz, Nolon Rod, MD  oxyCODONE-acetaminophen (PERCOCET/ROXICET) 5-325 MG tablet Take 1 tablet by mouth every 8 (eight) hours as needed for moderate pain (pain score 4-6) or severe pain (pain score 7-10). 01/06/23  Yes [provider]  propranolol (INDERAL) 40 MG tablet TAKE 1 TABLET BY MOUTH DAILY Patient taking differently: Take 40 mg by mouth daily. 12/26/22  Yes Lewayne Bunting, MD  rosuvastatin (CRESTOR) 20 MG tablet TAKE 1 TABLET BY MOUTH DAILY Patient taking differently: Take 20 mg by mouth daily. 01/24/22  Yes Lewayne Bunting, MD  timolol (TIMOPTIC) 0.5 % ophthalmic solution  Place 1 drop into both eyes every morning. As directed 02/13/11  Yes [provider]  Cyanocobalamin ([employer] VITAMIN B-12) 5000 MCG SUBL Take 5,000 mcg by mouth daily. Patient not taking: Reported on 09/18/2022    [provider]   Allergies  Allergen Reactions   Atorvastatin Other (See Comments)    REACTION: increased cpk   Celecoxib Other (See Comments)    REACTION: palpitations= celebrex   Sulfonamide Derivatives Other (See Comments)    Unknown   Zolpidem Tartrate Other (See Comments)    REACTION: made patient feel weird= ambien   Review of Systems +weakness  Physical Exam Weak appearing gentleman resting in bed No distress Regular work of breathing No edema Awakens easily and interacts appropriately Abdomen not distended  Vital Signs: BP (!) 93/40 (BP Location: Left Arm)   Pulse (!) 54   Temp 98.2 F (36.8 C) (Oral)   Resp 18   Ht 6\' 2"  (1.88 m)   Wt 83.5 kg   SpO2 95%   BMI 23.62 kg/m  Pain Scale: 0-10 POSS *See Group Information*: 1-Acceptable,Awake and alert Pain Score: 2    SpO2: SpO2: 95 % O2 Device:SpO2: 95 % O2 Flow Rate: .   IO: Intake/output summary:  Intake/Output Summary (Last 24 hours) at 01/20/2023 1455 Last data filed at 01/20/2023 1000 Gross per 24 hour  Intake 960.69 ml  Output --  Net 960.69 ml    LBM: Last BM Date : 01/15/23 Baseline Weight: Weight: 83.5 kg Most recent weight: Weight: 83.5 kg     Palliative Assessment/Data:   PPS 40%  Time In:  1400 Time Out:  1500 Time Total:  60  Greater than 50%  of this time was spent counseling and coordinating care related to the above assessment and plan.  Signed by: Rosalin Hawking, MD   Please contact Palliative Medicine  Team phone at [phone] for questions and concerns.  For individual provider: See Loretha Stapler

## 2023-01-21 ENCOUNTER — Inpatient Hospital Stay (HOSPITAL_COMMUNITY): Admit: 2023-01-21 | Discharge: 2023-01-21 | Disposition: A | Discharge status: Home / Self Care | Payer: Medicare Other

## 2023-01-21 ENCOUNTER — Encounter (HOSPITAL_COMMUNITY): Payer: Medicare Other

## 2023-01-21 DIAGNOSIS — M5134 Other intervertebral disc degeneration, thoracic region: Secondary | ICD-10-CM

## 2023-01-21 DIAGNOSIS — K8020 Calculus of gallbladder without cholecystitis without obstruction: Secondary | ICD-10-CM | POA: Diagnosis not present

## 2023-01-21 DIAGNOSIS — I679 Cerebrovascular disease, unspecified: Secondary | ICD-10-CM

## 2023-01-21 DIAGNOSIS — N179 Acute kidney failure, unspecified: Secondary | ICD-10-CM | POA: Diagnosis not present

## 2023-01-21 DIAGNOSIS — C61 Malignant neoplasm of prostate: Secondary | ICD-10-CM | POA: Diagnosis not present

## 2023-01-21 DIAGNOSIS — C7951 Secondary malignant neoplasm of bone: Secondary | ICD-10-CM | POA: Diagnosis not present

## 2023-01-21 DIAGNOSIS — R972 Elevated prostate specific antigen [PSA]: Secondary | ICD-10-CM | POA: Diagnosis not present

## 2023-01-21 DIAGNOSIS — E785 Hyperlipidemia, unspecified: Secondary | ICD-10-CM | POA: Diagnosis not present

## 2023-01-21 DIAGNOSIS — S22080A Wedge compression fracture of T11-T12 vertebra, initial encounter for closed fracture: Secondary | ICD-10-CM | POA: Diagnosis not present

## 2023-01-21 HISTORY — PX: IR FLUORO GUIDED NEEDLE PLC ASPIRATION/INJECTION LOC: IMG2395

## 2023-01-21 LAB — HEMOGLOBIN A1C
Hgb A1c MFr Bld: 6.5 % — ABNORMAL HIGH (ref 4.8–5.6)
Mean Plasma Glucose: 140 mg/dL

## 2023-01-21 LAB — RENAL FUNCTION PANEL
Albumin: 2.6 g/dL — ABNORMAL LOW (ref 3.5–5.0)
Anion gap: 9 (ref 5–15)
BUN: 73 mg/dL — ABNORMAL HIGH (ref 8–23)
CO2: 19 mmol/L — ABNORMAL LOW (ref 22–32)
Calcium: 8.3 mg/dL — ABNORMAL LOW (ref 8.9–10.3)
Chloride: 106 mmol/L (ref 98–111)
Creatinine, Ser: 1.94 mg/dL — ABNORMAL HIGH (ref 0.61–1.24)
GFR, Estimated: 33 mL/min — ABNORMAL LOW (ref >60)
Glucose, Bld: 120 mg/dL — ABNORMAL HIGH (ref 70–99)
Phosphorus: 3.5 mg/dL (ref 2.5–4.6)
Potassium: 3.7 mmol/L (ref 3.5–5.1)
Sodium: 134 mmol/L — ABNORMAL LOW (ref 135–145)

## 2023-01-21 LAB — CBC
HCT: 35.7 % — ABNORMAL LOW (ref 39.0–52.0)
Hemoglobin: 11.2 g/dL — ABNORMAL LOW (ref 13.0–17.0)
MCH: 28.7 pg (ref 26.0–34.0)
MCHC: 31.4 g/dL (ref 30.0–36.0)
MCV: 91.5 fL (ref 80.0–100.0)
Platelets: 230 10*3/uL (ref 150–400)
RBC: 3.9 MIL/uL — ABNORMAL LOW (ref 4.22–5.81)
RDW: 13.2 % (ref 11.5–15.5)
WBC: 7.2 10*3/uL (ref 4.0–10.5)
nRBC: 0 % (ref 0.0–0.2)

## 2023-01-21 LAB — KAPPA/LAMBDA LIGHT CHAINS
Kappa free light chain: 48.8 mg/L — ABNORMAL HIGH (ref 3.3–19.4)
Kappa, lambda light chain ratio: 1.89 — ABNORMAL HIGH (ref 0.26–1.65)
Lambda free light chains: 25.8 mg/L (ref 5.7–26.3)

## 2023-01-21 LAB — IGG, IGA, IGM
IgA: 162 mg/dL (ref 61–437)
IgG (Immunoglobin G), Serum: 658 mg/dL (ref 603–1613)
IgM (Immunoglobulin M), Srm: 47 mg/dL (ref 15–143)

## 2023-01-21 LAB — GLUCOSE, CAPILLARY
Glucose-Capillary: 122 mg/dL — ABNORMAL HIGH (ref 70–99)
Glucose-Capillary: 143 mg/dL — ABNORMAL HIGH (ref 70–99)
Glucose-Capillary: 102 mg/dL — ABNORMAL HIGH (ref 70–99)

## 2023-01-21 LAB — MAGNESIUM: Magnesium: 2 mg/dL (ref 1.7–2.4)

## 2023-01-21 MED ORDER — PROPRANOLOL HCL 20 MG PO TABS
30.0000 mg | ORAL_TABLET | Freq: Every day | ORAL | Status: DC
  Administered 2023-01-21: 30 mg via ORAL
  Filled 2023-01-21 (×2): qty 2

## 2023-01-21 MED ORDER — FENTANYL CITRATE (PF) 100 MCG/2ML IJ SOLN
INTRAMUSCULAR | Status: DC | PRN
  Administered 2023-01-21 (×3): 25 ug via INTRAVENOUS

## 2023-01-21 MED ORDER — LIDOCAINE HCL (PF) 1 % IJ SOLN
INTRAMUSCULAR | Status: AC
  Filled 2023-01-21: qty 30

## 2023-01-21 MED ORDER — BUPIVACAINE HCL (PF) 0.5 % IJ SOLN
INTRAMUSCULAR | Status: AC
  Filled 2023-01-21: qty 30

## 2023-01-21 MED ORDER — FENTANYL CITRATE (PF) 100 MCG/2ML IJ SOLN
INTRAMUSCULAR | Status: AC
  Filled 2023-01-21: qty 2

## 2023-01-21 MED ORDER — BICALUTAMIDE 50 MG PO TABS
50.0000 mg | ORAL_TABLET | Freq: Every day | ORAL | Status: DC
Start: 2023-01-21 — End: 2023-01-29
  Administered 2023-01-21 – 2023-01-28 (×8): 50 mg via ORAL
  Filled 2023-01-21 (×8): qty 1

## 2023-01-21 MED ORDER — SODIUM CHLORIDE 0.9 % IV SOLN: INTRAVENOUS | Status: DC

## 2023-01-21 MED ORDER — BUPIVACAINE HCL (PF) 0.25 % IJ SOLN: 30.0000 mL | Freq: Once | INTRAMUSCULAR | Status: DC

## 2023-01-21 MED ORDER — MIDAZOLAM HCL 2 MG/2ML IJ SOLN
INTRAMUSCULAR | Status: DC | PRN
  Administered 2023-01-21: .5 mg via INTRAVENOUS
  Administered 2023-01-21: 1 mg via INTRAVENOUS

## 2023-01-21 MED ORDER — BUPIVACAINE HCL (PF) 0.25 % IJ SOLN
INTRAMUSCULAR | Status: AC
  Filled 2023-01-21: qty 30

## 2023-01-21 MED ORDER — LIDOCAINE HCL (PF) 1 % IJ SOLN: 30.0000 mL | Freq: Once | INTRAMUSCULAR | Status: DC

## 2023-01-21 MED ORDER — MIDAZOLAM HCL 2 MG/2ML IJ SOLN
INTRAMUSCULAR | Status: AC
  Filled 2023-01-21: qty 2

## 2023-01-21 NOTE — Progress Notes (Signed)
 [facility] arrived to [facility] IR at 1458 to transport pt to [facility] after [facility] IR procedure for T12 Core Biopsy with Dr. Lizzie everitt Sarks. Report given to [place], RN and Jerrell, EMT at bedside from [facility]. Pt. On RA, alert. Pt. Ate lunch and tolerated. PIV x1. UO x3. No pain reported to this RN until transferring to Careers Information Officer for transport. Called report to Marca Overland, RN at [facility] by phone.

## 2023-01-21 NOTE — Progress Notes (Addendum)
 William Faulkner   DOB:June 26, 1934   FM#:[id number]    ASSESSMENT & PLAN:   88 year old male with history of diabetes, CAD/CABG, hypertension, glaucoma presented with back pain found to have diffuse bony lesions.  We discussed the results in the setting of bone lesions with elevated PSA, he most likely has prostate cancer, if not another second malignancy a/w the bony lesions.  Free light chains and immunoglobulin panels were nondiagnostic.  Biopsy was done today and results are pending.  Given his pain not completely controlled, consider bicalutamide  in the meantime while waiting for pathology.  The risks and benefit were discussed with patient and family.  Risks are fairly minimal in terms of potential side effects from bicalutamide  especially given the PSA of 261 it would be unlikely he does not have prostate cancer.  After discussion, he expressed understanding and will proceed with bicalutamide  in the meantime.  Start bicalutamide  50 mg daily Pain control per primary Follow-up with pathology Counseled on increase fluid intake Once pain is better controlled, and renal function improved, patient may be discharged and follow-up with me in the clinic later this week.  If still remain inpatient on Thursday, I will follow-up again.  Thank you for the consult. Will follow with you.  William [name], MD 01/21/2023 6:48 PM  Subjective:  William Faulkner reports feeling   Objective:  Vitals:   01/21/23 0542 01/21/23 1541  BP: (!) 130/43 (!) 143/43  Pulse: (!) 56 72  Resp: 17   Temp: 97.8 F (36.6 C) 98 F (36.7 C)  SpO2: 97% 97%     Intake/Output Summary (Last 24 hours) at 01/21/2023 1848 Last data filed at 01/21/2023 1617 Gross per 24 hour  Intake 1608.33 ml  Output 725 ml  Net 883.33 ml    GENERAL: alert, no distress and comfortable SKIN: skin color normal EYES: normal, sclera clear NECK: supple, no palpable mass LYMPH:  no palpable cervical lymphadenopathy LUNGS:  normal breathing  effort.  ABDOMEN: abdomen soft, non-tender and non-distended Musculoskeletal: no lower extremity edema NEURO: alert with fluent speech, no focal motor/sensory deficits Strength and sensation equal bilaterally   Labs:  Recent Labs    01/18/23 0822 01/20/23 0321 01/21/23 0319  NA 139 135 134*  K 4.3 4.2 3.7  CL 105 103 106  CO2 21* 21* 19*  GLUCOSE 128* 110* 120*  BUN 38* 61* 73*  CREATININE 1.47* 2.44* 1.94*  CALCIUM  10.0 8.9 8.3*  GFRNONAA 46* 25* 33*  PROT 7.6  --   --   ALBUMIN 3.6 2.7* 2.6*  AST 22  --   --   ALT 16  --   --   ALKPHOS 85  --   --   BILITOT 0.9  --   --     Studies:  IR Fluoro Guide Ndl Plmt / BX Result Date: 01/21/2023 INDICATION: Pathologic fracture. EXAM: FLUOROSCOPY GUIDED T12 CORE BONE BIOPSY MEDICATIONS: None. ANESTHESIA/SEDATION: Moderate (conscious) sedation was employed during this procedure. A total of Versed  1.5 mg and Fentanyl  75 mcg was administered intravenously by the radiology nurse. Total intra-service moderate Sedation Time: 29 minutes. The patient's level of consciousness and vital signs were monitored continuously by radiology nursing throughout the procedure under my direct supervision. Fluoroscopy does: 657 mGy air Kerma. COMPLICATIONS: None immediate. PROCEDURE: Informed written consent was obtained from the patient after a thorough discussion of the procedural risks, benefits and alternatives. All questions were addressed. Maximal Sterile Barrier Technique was utilized including caps, mask, sterile gowns, sterile gloves,  sterile drape, hand hygiene and skin antiseptic. A timeout was performed prior to the initiation of the procedure. The patient was placed in prone position on the angiography table. The thoracic spine region was prepped and draped in a sterile fashion. Under fluoroscopy, the T12 vertebral body was delineated and the skin area was marked. The skin was infiltrated with a 1% Lidocaine  approximately 3 cm lateral to the spinous  process projection on the right. Using a 22-gauge spinal needle, the soft issue and the peripedicular space and periosteum were infiltrated with Bupivacaine  0.5%. A skin incision was made at the access site. Subsequently, an 11-gauge Kyphon trocar was inserted under fluoroscopic guidance until contact with the pedicle was obtained. The trocar was inserted under light hammer tapping into the pedicle until the posterior boundaries of the vertebral body was reached. The diamond mandrill was removed and 3 core bone biopsy samples were obtained. The trocar was later removed. The access site was cleaned and covered with a sterile bandage. IMPRESSION: Fluoroscopy guided T12 core bone biopsy performed using tiny fragments on each core biopsy pass. Samples obtained were sent for tissue exam. Electronically Signed   By: William Faulkner William Faulkner M.D.   On: 01/21/2023 12:46   DG Bone Survey Met Result Date: 01/20/2023 CLINICAL DATA:  Lytic bone lesions on x-ray. Multiple metastatic deposits throughout the thoracic spine. EXAM: METASTATIC BONE SURVEY COMPARISON:  Thoracic spine MRI 01/18/2023. Chest abdomen pelvis CT 01/18/2023. FINDINGS: Patient with known lesions in the thoracic and lumbar spine. This was better assessed on recent CT and MRI. The majority of these lesions are not well-defined by radiograph. The known T12 compression fracture is not well assessed on the current exam due to osseous overlap. The known left iliac sclerotic lesion is not well seen by radiograph. Area of sclerosis projects over the lateral aspect of the right distal tibia at the ankle joint. Other there are extremity lesion is seen by radiograph. Prior median sternotomy. Blunting of the left costophrenic angle, likely epicardial fat pad and atelectasis. Aortic atherosclerosis and vascular calcifications. IMPRESSION: 1. Patient with known spinal osseous bone lesions. This was better assessed on recent CT and MRI. The majority of these  lesions are not well-defined by radiograph. 2. The known T12 compression fracture is not well assessed on the current exam due to osseous overlap. 3. The known left iliac sclerotic lesion is not well seen by radiograph. 4. Area of sclerosis projects over the lateral aspect of the right distal tibia at the ankle joint, indeterminate. Electronically Signed   By: Andrea Gasman M.D.   On: 01/20/2023 22:50   US  RENAL Result Date: 01/20/2023 CLINICAL DATA:  Acute kidney injury EXAM: RENAL / URINARY TRACT ULTRASOUND COMPLETE COMPARISON:  CT 01/18/2023 FINDINGS: Right Kidney: Renal measurements: 9.1 x 4.7 x 4.4 cm = volume: 9.5 mL. Echogenicity within normal limits. No mass or hydronephrosis visualized. Left Kidney: Renal measurements: 11.9 x 5.3 x 5 cm = volume: 165 mL. Echogenicity within normal limits. No mass or hydronephrosis visualized. Bladder: Appears normal for degree of bladder distention. Other: Prostate enlargement at least 5.7 x 5.3 cm. IMPRESSION: 1. Negative for hydronephrosis. 2. Prostate enlargement. Electronically Signed   By: JONETTA Faes M.D.   On: 01/20/2023 16:00   MR THORACIC SPINE WO CONTRAST Result Date: 01/18/2023 CLINICAL DATA:  Bladder cancer. Evaluate for metastatic disease to the thoracic spine. Chronic back pain. Weakness beginning approximately 1 month ago. EXAM: MRI THORACIC SPINE WITHOUT CONTRAST TECHNIQUE: Multiplanar, multisequence MR imaging  of the thoracic spine was performed. No intravenous contrast was administered. COMPARISON:  CT angio chest, abdomen and pelvis 01/18/2023. FINDINGS: Alignment: No significant listhesis is present. Mild rightward curvature is centered at T8-9. Thoracic kyphosis is intact. Vertebrae: Multiple metastatic deposits are present throughout the thoracic spine. A 6 mm lesion is present within the right pedicle of T1. An 8 mm lesion is present posterior elements of T2. Anterior lesion at T2 measures 13 mm. Metastatic lesions are present within the T3  vertebral body and the spinous process of T3. A right superior lesion at T5 extends to the pedicle. Rounded metastases at T7 measure 10 and 11 mm. Larger metastases at T8 extend from the superior to inferior endplate, 18 mm. Similar larger lesions are present throughout the remainder of the lower thoracic spine. A right T11 lesion extends into the right pedicle. Diffuse metastatic infiltration is present in the T12 vertebral body. No pathologic fractures are present. Extensive infiltration of the L1 vertebral body is present. Cord:  Normal signal and morphology. Paraspinal and other soft tissues: See CT report from the same day for further description of metastatic disease outside the spine. Disc levels: A central disc protrusion at T8-9 effaces the ventral CSF. The foramina are patent bilaterally. No significant disc disease or stenosis is present above this level. T9-10: Negative. T10-11: Negative. T11-12: A rightward disc protrusion and annular tear is present without focal stenosis. T12-L1: Negative. IMPRESSION: 1. Multiple metastatic deposits throughout the thoracic spine as described. 2. No pathologic fractures. 3. Central disc protrusion at T8-9 effaces the ventral CSF. 4. Rightward disc protrusion and annular tear at T11-12 without focal stenosis. Electronically Signed   By: Lonni Necessary M.D.   On: 01/18/2023 19:20   CT Angio Chest/Abd/Pel for Dissection W and/or Wo Contrast Result Date: 01/18/2023 CLINICAL DATA:  88 year old male with pain, weakness. Known mild aneurysmal dilatation of the ascending thoracic aorta. Bladder cancer. EXAM: CT ANGIOGRAPHY CHEST, ABDOMEN AND PELVIS TECHNIQUE: Non-contrast CT of the chest was initially obtained. Multidetector CT imaging through the chest, abdomen and pelvis was performed using the standard protocol during bolus administration of intravenous contrast. Multiplanar reconstructed images and MIPs were obtained and reviewed to evaluate the vascular anatomy.  RADIATION DOSE REDUCTION: This exam was performed according to the departmental dose-optimization program which includes automated exposure control, adjustment of the mA and/or kV according to patient size and/or use of iterative reconstruction technique. CONTRAST:  75mL OMNIPAQUE  IOHEXOL  350 MG/ML SOLN COMPARISON:  CTA chest 08/02/2022 and earlier. Restaging CT Abdomen and Pelvis 12/11/2022 FINDINGS: CTA CHEST FINDINGS Cardiovascular: Previous CABG. Extensive Calcified aortic atherosclerosis. Tortuous ascending aorta is stable, smoothly tapers in the arch. Negative for thoracic aortic dissection, periaortic hematoma. Heart size is stable, within normal limits. No pericardial effusion. Little contrast in the pulmonary arteries. Proximal great vessel atherosclerosis appears stable since July. Mediastinum/Nodes: Negative for mediastinal hematoma, mass, lymphadenopathy. Lungs/Pleura: Stable lung volumes since July. Patchy lung base atelectasis or scarring has not significantly changed. No pleural effusion. Small volume new retained secretions in the trachea just below the thoracic inlet on series 4 image 30. But elsewhere the major airways are patent. No active pulmonary inflammation is identified. Musculoskeletal: Chronic sternotomy. Chronic right 2nd rib deformity. T1 through T11 thoracic vertebrae appear stable since July. T12 compression fracture with mild comminution is new since July but was present, subtle in November when other skeletal metastatic disease was suspected. Central T12 loss of vertebral body height up to 30% (series 6, image 105). Lucency  within the T12 body appears progressed since July. No retropulsion. T12 pedicles and posterior elements appear intact and aligned. There is mild anterior and lateral prevertebral soft tissue or edema which is increased since November on series 11, image 56. No epidural tumor by CT. Review of the MIP images confirms the above findings. CTA ABDOMEN AND PELVIS  FINDINGS VASCULAR Aortoiliac calcified atherosclerosis. Extensive aortic and branch calcified atherosclerosis but the major arterial structures in the abdomen and pelvis remain patent. Negative for abdominal aortic dissection or aneurysm. Review of the MIP images confirms the above findings. NON-VASCULAR Hepatobiliary: Stable liver. Cholelithiasis. No gallbladder inflammation by CT. Pancreas: Diminutive. Spleen: Diminutive. Adrenals/Urinary Tract: Normal adrenal glands. Nonobstructed kidneys with symmetric renal enhancement. No hydroureter. Abnormal prostate at the base of the bladder as below. Diminutive bladder similar to the November CT. Stomach/Bowel: Decompressed large bowel from the splenic flexure distally but transverse colon is redundant with moderate retained gas and stool, increased since November. Low-density retained stool in the right colon. Cecum is on a lax mesentery. No dilated small bowel. No large bowel inflammation identified. Stomach and duodenum appear negative. No free air or free fluid. Lymphatic: No lymphadenopathy identified in the abdomen or pelvis. Reproductive: Abnormally heterogeneous, lobulated, masslike appearance of the prostate on series 10, image 203 not significantly changed from the restaging CT last month (please see that report) Other: No pelvis free fluid. Musculoskeletal: Lumbar vertebrae appear stable since November. Medial left iliac sclerotic bone lesions have not significantly changed. Sacrum and SI joints appear intact. Pelvis and proximal femurs appear to remain intact. Review of the MIP images confirms the above findings. IMPRESSION: 1. Negative for Aortic Dissection. Stable mild fusiform aneurysmal enlargement of the ascending aorta. Severe Aortic Atherosclerosis (ICD10-I70.0). 2. Positive for a subacute and pathologic appearing T12 compression fracture, with underlying lytic lesion and loss of height progressed since November. No retropulsion or other complicating  features. In conjunction with November CT findings of skeletal metastatic disease, abnormal bladder base and prostate as detailed on that exam. 3. No other acute finding in the Chest, Abdomen, or Pelvis. Cholelithiasis. Electronically Signed   By: VEAR Hurst M.D.   On: 01/18/2023 10:37

## 2023-01-21 NOTE — Procedures (Signed)
 INTERVENTIONAL NEURORADIOLOGY BRIEF POSTPROCEDURE NOTE  FLUOROSCOPY GUIDED T12 CORE BONE BIOPSY  Attending physician: [name], MD  Diagnosis: Pathologic fracture.  Access site: Percutaneous left transpedicular.  Anesthesia: IR sedation: Moderate sedation.  Medication used: 1.5 mg Versed  IV; 75 mcg Fentanyl  IV.  Complications: None.  Estimated blood loss: Negligible.  Specimen:  3  core biopsy sample.  Findings: Mild T12 fracture. 3 core passes performed collecting tiny friable fragments which were sent for tissue exam.  The patient tolerated the procedure well without incident or complication and is in stable condition.

## 2023-01-21 NOTE — Progress Notes (Signed)
 PROGRESS NOTE  William Faulkner FMW:[id number] DOB: [birth year]   PCP: Amon Aloysius BRAVO, MD  Patient is from: Home.  Lives with his wife.  Uses cane at times.  DOA: 01/18/2023 LOS: 1  Chief complaints Chief Complaint  Patient presents with   Back Pain   Nausea   Weakness     Brief Narrative / Interim history: 88 year old M with PMH of CAD/CABG, thoracic cord aneurysm, aortic insufficiency, CKD-3A, CVA, HTN, HLD, bladder cancer and recent diagnosis of vertebral metastasis for which she is followed by orthopedic surgery, presenting with progressive back pain for about 2 months.  No report of trauma or fall.  In ED, CT angio chest, abdomen and pelvis raise concern for subacute and pathologic T12 compression fracture without retropulsion or other complicating features, underlying lytic lesion and progressive loss of height, abnormal bladder base and prostate .  [facility], Dr. Burnetta consulted by EDP.  MRI thoracic spine obtained and showed multiple metastatic deposits throughout the thoracic spine, central disc protrusion at T8-9 without canal or foraminal stenosis, rightward disc protrusion and annular tear at T11-12 without focal stenosis but no pathologic fractures.  Oncology, urology and palliative consulted per recommendation by Ortho.  Patient underwent IR bone biopsy on 12/31.  Patient with AKI likely due to poor p.o. intake, ACE inhibitors and thiazide.  On IV fluid.  Subjective: Patient was taken to [facility] for IR biopsy this morning and has not returned yet.  No major events overnight of this morning.  AKI improved.  Objective: Vitals:   01/20/23 0520 01/20/23 1437 01/20/23 2149 01/21/23 0542  BP: (!) 121/43 (!) 93/40 (!) 125/47 (!) 130/43  Pulse: 83 (!) 54 (!) 55 (!) 56  Resp: 16 18 17 17   Temp: 97.6 F (36.4 C) 98.2 F (36.8 C) 97.6 F (36.4 C) 97.8 F (36.6 C)  TempSrc:  Oral Oral Oral  SpO2: 93% 95% 94% 97%  Weight:      Height:        Examination:  Patient  was at [facility] the whole day   Procedures:  12/31-IR bone biopsy  Microbiology summarized: None  Assessment and plan: Acute on chronic back pain: CT raise concern for pathologic T12 compression fracture.  However, MRI ruled out compression fracture but concerning for multiple mets, central disc protrusion at T8-9 and right Ward disc protrusion and abnormality at T11-12 without focal stenosis or complicating feature likely contributing to patient's pain.  No focal neurosymptoms or bowel or bladder habit change.  Pain improved.  Not requiring multiple pain medications -Appreciate input by Ortho-recommended consulting oncology, urology and palliative -Oncology consulted IR for bone biopsy-performed on 01/21/2023. 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  No pathologic fracture.  Unknown primary.  Patient has history of bladder cancer.  CT shows abnormal bladder base and prostate. -Appreciate input by oncology -S/p IR bone biopsy-follow-up pathology -Appreciate input by urology-outpatient follow-up for PET that needs to be postponed  AKI on CKD-3A: Likely due to poor p.o. intake, ACE inhibitor's and HCTZ.  Renal US  without acute finding but enlarged prostate.  AKI improved with IV fluid. Recent Labs    09/18/22 1102 01/18/23 0822 01/20/23 0321 01/21/23 0319  BUN 31* 38* 61* 73*  CREATININE 1.55* 1.47* 2.44*  1.94*  -Continue IV fluid at home cc an hour -Strict intake and output and bladder scan -Continue holding ACE inhibitor's and HCTZ -Recheck in the morning   Acute metabolic encephalopathy: Likely due to dehydration and gabapentin .  He has not required a lot of opiates.   Has no focal neurodeficit.  Improved. -Adjusted pain medications including gabapentin  -Reorientation and delirium precaution -IV fluid hydration  DM-2 with hyperglycemia, neuropathy, hyperlipidemia and CKD-3A: A1c 6.5% on 8/28. Recent Labs  Lab 01/20/23 0730 01/20/23 1142 01/20/23 1631 01/20/23 2114 01/21/23 0757  GLUCAP 91 131* 131* 155* 122*  -Discontinue metformin  given his poor renal function -Start SSI-sensitive -Decrease gabapentin   History of CAD/CABG: No cardiopulmonary symptoms.  Stable -Continue home meds  Essential hypertension: Soft BP with borderline bradycardia. -Continue holding Lotensin  and HCTZ. -Continue holding hydralazine  -Decrease propranolol  to 20 mg    Benign essential tremor -Decrease propranolol  in the setting of borderline bradycardia and hypotension   Glaucoma -Continue timolol  drops. -Follow-up with ophthalmology as an outpatient.   Body mass index is 23.62 kg/m.         DVT prophylaxis:  enoxaparin  (LOVENOX ) injection 30 mg Start: 01/22/23 1200  Code Status: Full code Family Communication: Updated patient's daughter at bedside. Level of care: Med-Surg Status is: Inpatient The patient will remain inpatient because: Acute back pain, AKI and encephalopathy   Final disposition: Likely home once medically cleared Consultants:  Orthopedic surgery Oncology Urology Palliative medicine Interventional radiology  35 minutes with more than 50% spent in reviewing records and coordinating care.   Sch Meds:  Scheduled Meds:  acetaminophen   1,000 mg Oral Q8H   aspirin  EC  81 mg Oral Daily   cholecalciferol   1,000 Units Oral Daily   [START ON 01/22/2023] enoxaparin  (LOVENOX ) injection  30 mg Subcutaneous Q24H   feeding supplement  237 mL Oral BID BM   gabapentin   200 mg Oral BID   insulin  aspart  0-9 Units Subcutaneous TID WC   nystatin    Topical TID   polyethylene glycol  17 g Oral Daily   propranolol   30 mg Oral Daily   rosuvastatin    20 mg Oral Daily   timolol   1 drop Both Eyes q morning   Continuous Infusions:  sodium chloride      PRN Meds:.HYDROmorphone  (DILAUDID ) injection, naLOXone  (NARCAN )  injection, ondansetron  **OR** ondansetron  (ZOFRAN ) IV, oxyCODONE   Antimicrobials: Anti-infectives (From admission, onward)    None        I have personally reviewed the following labs and images: CBC: Recent Labs  Lab 01/18/23 0822 01/20/23 0321 01/21/23 0319  WBC 7.6 9.8 7.2  NEUTROABS 5.9  --   --   HGB 14.4 11.6* 11.2*  HCT 46.0 36.1* 35.7*  MCV 91.1 90.0 91.5  PLT 299 240 230   BMP &GFR Recent Labs  Lab 01/18/23 0822 01/20/23 0321 01/21/23 0319  NA 139 135 134*  K 4.3 4.2 3.7  CL 105 103 106  CO2 21* 21* 19*  GLUCOSE 128* 110* 120*  BUN 38* 61* 73*  CREATININE 1.47* 2.44* 1.94*  CALCIUM  10.0 8.9 8.3*  MG  --  2.1 2.0  PHOS  --  3.8 3.5   Estimated Creatinine Clearance: 31.8 mL/min (A) (by C-G formula based on SCr of 1.94 mg/dL (H)). Liver & Pancreas: Recent Labs  Lab 01/18/23 0822 01/20/23 0321 01/21/23 0319  AST 22  --   --   ALT 16  --   --   ALKPHOS 85  --   --  BILITOT 0.9  --   --   PROT 7.6  --   --   ALBUMIN 3.6 2.7* 2.6*   Recent Labs  Lab 01/18/23 0822  LIPASE 28   No results for input(s): AMMONIA in the last 168 hours. Diabetic: Recent Labs    01/20/23 0325  HGBA1C 6.5*   Recent Labs  Lab 01/20/23 0730 01/20/23 1142 01/20/23 1631 01/20/23 2114 01/21/23 0757  GLUCAP 91 131* 131* 155* 122*   Cardiac Enzymes: No results for input(s): CKTOTAL, CKMB, CKMBINDEX, TROPONINI in the last 168 hours. No results for input(s): PROBNP in the last 8760 hours. Coagulation Profile: Recent Labs  Lab 01/20/23 1925  INR 1.1   Thyroid  Function Tests: No results for input(s): TSH, T4TOTAL, FREET4, T3FREE, THYROIDAB in the last 72 hours. Lipid Profile: No results for input(s): CHOL, HDL, LDLCALC, TRIG, CHOLHDL, LDLDIRECT in the last 72  hours. Anemia Panel: No results for input(s): VITAMINB12, FOLATE, FERRITIN, TIBC, IRON, RETICCTPCT in the last 72 hours. Urine analysis:    Component Value Date/Time   COLORURINE YELLOW 01/18/2023 0822   APPEARANCEUR CLEAR 01/18/2023 0822   LABSPEC 1.017 01/18/2023 0822   PHURINE 5.0 01/18/2023 0822   GLUCOSEU NEGATIVE 01/18/2023 0822   GLUCOSEU NEGATIVE 03/16/2020 1200   HGBUR MODERATE (A) 01/18/2023 0822   HGBUR negative 05/20/2008 1620   BILIRUBINUR NEGATIVE 01/18/2023 0822   BILIRUBINUR Negative 03/16/2020 1138   KETONESUR 5 (A) 01/18/2023 0822   PROTEINUR 30 (A) 01/18/2023 0822   UROBILINOGEN 0.2 03/16/2020 1200   UROBILINOGEN 0.2 03/16/2020 1138   NITRITE NEGATIVE 01/18/2023 0822   LEUKOCYTESUR NEGATIVE 01/18/2023 0822   Sepsis Labs: Invalid input(s): PROCALCITONIN, LACTICIDVEN  Microbiology: No results found for this or any previous visit (from the past 240 hours).  Radiology Studies: IR Fluoro Guide Ndl Plmt / BX Result Date: 01/21/2023 INDICATION: Pathologic fracture. EXAM: FLUOROSCOPY GUIDED T12 CORE BONE BIOPSY MEDICATIONS: None. ANESTHESIA/SEDATION: Moderate (conscious) sedation was employed during this procedure. A total of Versed  1.5 mg and Fentanyl  75 mcg was administered intravenously by the radiology nurse. Total intra-service moderate Sedation Time: 29 minutes. The patient's level of consciousness and vital signs were monitored continuously by radiology nursing throughout the procedure under my direct supervision. Fluoroscopy does: 657 mGy air Kerma. COMPLICATIONS: None immediate. PROCEDURE: Informed written consent was obtained from the patient after a thorough discussion of the procedural risks, benefits and alternatives. All questions were addressed. Maximal Sterile Barrier Technique was utilized including caps, mask, sterile gowns, sterile gloves, sterile drape, hand hygiene and skin antiseptic. A timeout was performed prior to the initiation of  the procedure. The patient was placed in prone position on the angiography table. The thoracic spine region was prepped and draped in a sterile fashion. Under fluoroscopy, the T12 vertebral body was delineated and the skin area was marked. The skin was infiltrated with a 1% Lidocaine  approximately 3 cm lateral to the spinous process projection on the right. Using a 22-gauge spinal needle, the soft issue and the peripedicular space and periosteum were infiltrated with Bupivacaine  0.5%. A skin incision was made at the access site. Subsequently, an 11-gauge Kyphon trocar was inserted under fluoroscopic guidance until contact with the pedicle was obtained. The trocar was inserted under light hammer tapping into the pedicle until the posterior boundaries of the vertebral body was reached. The diamond mandrill was removed and 3 core bone biopsy samples were obtained. The trocar was later removed. The access site was cleaned and covered with a sterile bandage. IMPRESSION: Fluoroscopy  guided T12 core bone biopsy performed using tiny fragments on each core biopsy pass. Samples obtained were sent for tissue exam. Electronically Signed   By: [name] M.D.   On: 01/21/2023 12:46   DG Bone Survey Met Result Date: 01/20/2023 CLINICAL DATA:  Lytic bone lesions on x-ray. Multiple metastatic deposits throughout the thoracic spine. EXAM: METASTATIC BONE SURVEY COMPARISON:  Thoracic spine MRI 01/18/2023. Chest abdomen pelvis CT 01/18/2023. FINDINGS: Patient with known lesions in the thoracic and lumbar spine. This was better assessed on recent CT and MRI. The majority of these lesions are not well-defined by radiograph. The known T12 compression fracture is not well assessed on the current exam due to osseous overlap. The known left iliac sclerotic lesion is not well seen by radiograph. Area of sclerosis projects over the lateral aspect of the right distal tibia at the ankle joint. Other there are extremity  lesion is seen by radiograph. Prior median sternotomy. Blunting of the left costophrenic angle, likely epicardial fat pad and atelectasis. Aortic atherosclerosis and vascular calcifications. IMPRESSION: 1. Patient with known spinal osseous bone lesions. This was better assessed on recent CT and MRI. The majority of these lesions are not well-defined by radiograph. 2. The known T12 compression fracture is not well assessed on the current exam due to osseous overlap. 3. The known left iliac sclerotic lesion is not well seen by radiograph. 4. Area of sclerosis projects over the lateral aspect of the right distal tibia at the ankle joint, indeterminate. Electronically Signed   By: Andrea Gasman M.D.   On: 01/20/2023 22:50       Birl Lobello T. Jamier Urbas [facility]  If 7PM-7AM, please contact night-coverage [email or web address] 01/21/2023, 1:45 PM

## 2023-01-21 NOTE — TOC Progression Note (Signed)
 Transition of Care [facility]) - Progression Note   Patient Details  Name: William Faulkner MRN: [id number] Date of Birth: 1934-05-30  Transition of Care [facility]) CM/SW Contact  Duwaine GORMAN Aran, LCSW Phone Number: 01/21/2023, 11:13 AM  Clinical Narrative: HH orders placed by hospitalist HHPT/OT. CSW updated Cindie with [facility].  Expected Discharge Plan: Home w Home Health Services Barriers to Discharge: Continued Medical Work up  Expected Discharge Plan and Services In-house Referral: Clinical Social Work Post Acute Care Choice: Home Health Living arrangements for the past 2 months: Single Family Home           DME Arranged: N/A DME Agency: NA HH Arranged: PT, OT HH Agency: [facility] Date HH Agency Contacted: 01/20/23 Time HH Agency Contacted: [id number] Representative spoke with at [facility] Agency: Cindie  Social Determinants of Health (SDOH) Interventions SDOH Screenings   Food Insecurity: No Food Insecurity (01/18/2023)  Housing: Low Risk  (01/18/2023)  Transportation Needs: No Transportation Needs (01/18/2023)  Utilities: Not At Risk (01/18/2023)  Depression (PHQ2-9): Low Risk  (09/18/2022)  Social Connections: Moderately Isolated (01/20/2023)  Tobacco Use: Medium Risk (01/18/2023)   Readmission Risk Interventions     No data to display

## 2023-01-21 NOTE — Progress Notes (Signed)
    Subjective:    Patient reports pain as 4 on 0-10 scale.   Denies CP or SOB.  Voiding without difficulty. Positive flatus. Objective: Vital signs in last 24 hours: Temp:  [97.6 F (36.4 C)-98.2 F (36.8 C)] 97.8 F (36.6 C) (12/31 0542) Pulse Rate:  [54-56] 56 (12/31 0542) Resp:  [17-18] 17 (12/31 0542) BP: (93-130)/(40-47) 130/43 (12/31 0542) SpO2:  [94 %-97 %] 97 % (12/31 0542)  Intake/Output from previous day: 12/30 0701 - 12/31 0700 In: 3009.6 [P.O.:600; I.V.:1395; IV Piggyback:1014.6] Out: 125 [Urine:125] Intake/Output this shift: No intake/output data recorded.  Labs: Recent Labs    01/18/23 0822 01/20/23 0321 01/21/23 0319  HGB 14.4 11.6* 11.2*   Recent Labs    01/20/23 0321 01/21/23 0319  WBC 9.8 7.2  RBC 4.01* 3.90*  HCT 36.1* 35.7*  PLT 240 230   Recent Labs    01/20/23 0321 01/21/23 0319  NA 135 134*  K 4.2 3.7  CL 103 106  CO2 21* 19*  BUN 61* 73*  CREATININE 2.44* 1.94*  GLUCOSE 110* 120*  CALCIUM  8.9 8.3*   Recent Labs    01/20/23 1925  INR 1.1    Physical Exam: Neurologically intact ABD soft Intact pulses distally Dorsiflexion/Plantar flexion intact Compartment soft Body mass index is 23.62 kg/m.   Assessment/Plan:  See close a very pleasant 87 year old gentleman who was admitted with significant progressive low back pain.  He states since his admission his pain has improved.  He has been ambulating with physical therapy. 1.  Spoke with medical oncology (Dr. Tina).  Agree that the MRI lesions do appear to be more consistent with multiple myeloma than metastatic prostate CA.  Would recommend a skeletal survey to confirm and also to determine the best place to perform a biopsy. 2.  Spoke with Dr. Gonfa and there was a question concerning the need for kyphoplasty.  Imaging studies demonstrated that there is no fracture that would require kyphoplasty.  The diffuse back pain especially in his lower lumbar region can be due to his  pre-existing degenerative disease or the lesions that are in the lumbar spine.  Since there is no way to confirm which lesion is the primary one that could be contributing to his pain I do not think there is a need for the kyphoplasty.  With respect to the biopsy I would hold on doing the spine biopsy until the skeletal survey is completed.  There may be a more accessible location with less risk. 3.  Appreciate the palliative care consult.  Continue to monitor progress.  If any specific questions or issues arise please not hesitate contact me.  Donaciano JONETTA Sprang for Dr. Donaciano Sprang [facility] [phone] 01/21/2023, 8:07 AM

## 2023-01-21 NOTE — Plan of Care (Signed)

## 2023-01-22 DIAGNOSIS — I10 Essential (primary) hypertension: Secondary | ICD-10-CM | POA: Diagnosis not present

## 2023-01-22 DIAGNOSIS — C7951 Secondary malignant neoplasm of bone: Secondary | ICD-10-CM | POA: Diagnosis not present

## 2023-01-22 DIAGNOSIS — S22080A Wedge compression fracture of T11-T12 vertebra, initial encounter for closed fracture: Secondary | ICD-10-CM | POA: Diagnosis not present

## 2023-01-22 DIAGNOSIS — K8020 Calculus of gallbladder without cholecystitis without obstruction: Secondary | ICD-10-CM | POA: Diagnosis not present

## 2023-01-22 LAB — CBC
HCT: 34.8 % — ABNORMAL LOW (ref 39.0–52.0)
Hemoglobin: 10.9 g/dL — ABNORMAL LOW (ref 13.0–17.0)
MCH: 28.5 pg (ref 26.0–34.0)
MCHC: 31.3 g/dL (ref 30.0–36.0)
MCV: 91.1 fL (ref 80.0–100.0)
Platelets: 246 10*3/uL (ref 150–400)
RBC: 3.82 MIL/uL — ABNORMAL LOW (ref 4.22–5.81)
RDW: 13 % (ref 11.5–15.5)
WBC: 7.1 10*3/uL (ref 4.0–10.5)
nRBC: 0 % (ref 0.0–0.2)

## 2023-01-22 LAB — RENAL FUNCTION PANEL
Albumin: 2.5 g/dL — ABNORMAL LOW (ref 3.5–5.0)
Anion gap: 7 (ref 5–15)
BUN: 55 mg/dL — ABNORMAL HIGH (ref 8–23)
CO2: 22 mmol/L (ref 22–32)
Calcium: 8.5 mg/dL — ABNORMAL LOW (ref 8.9–10.3)
Chloride: 107 mmol/L (ref 98–111)
Creatinine, Ser: 1.56 mg/dL — ABNORMAL HIGH (ref 0.61–1.24)
GFR, Estimated: 43 mL/min — ABNORMAL LOW (ref >60)
Glucose, Bld: 113 mg/dL — ABNORMAL HIGH (ref 70–99)
Phosphorus: 3.2 mg/dL (ref 2.5–4.6)
Potassium: 3.6 mmol/L (ref 3.5–5.1)
Sodium: 136 mmol/L (ref 135–145)

## 2023-01-22 LAB — MAGNESIUM: Magnesium: 2 mg/dL (ref 1.7–2.4)

## 2023-01-22 LAB — GLUCOSE, CAPILLARY
Glucose-Capillary: 114 mg/dL — ABNORMAL HIGH (ref 70–99)
Glucose-Capillary: 115 mg/dL — ABNORMAL HIGH (ref 70–99)
Glucose-Capillary: 154 mg/dL — ABNORMAL HIGH (ref 70–99)
Glucose-Capillary: 87 mg/dL (ref 70–99)

## 2023-01-22 MED ORDER — ACETAMINOPHEN 500 MG PO TABS: ORAL_TABLET | ORAL | Status: AC

## 2023-01-22 MED ORDER — OXYCODONE HCL 5 MG PO TABS: 5.0000 mg | ORAL_TABLET | Freq: Three times a day (TID) | ORAL | 0 refills | Status: DC | PRN

## 2023-01-22 MED ORDER — GUAIFENESIN ER 600 MG PO TB12
600.0000 mg | ORAL_TABLET | Freq: Two times a day (BID) | ORAL | Status: DC
  Administered 2023-01-22 – 2023-01-28 (×13): 600 mg via ORAL
  Filled 2023-01-22 (×13): qty 1

## 2023-01-22 MED ORDER — BICALUTAMIDE 50 MG PO TABS: 50.0000 mg | ORAL_TABLET | Freq: Every day | ORAL | 2 refills | Status: DC

## 2023-01-22 MED ORDER — LIDOCAINE 5 % EX PTCH
1.0000 | MEDICATED_PATCH | CUTANEOUS | Status: DC
  Administered 2023-01-22 – 2023-01-27 (×6): 1 via TRANSDERMAL
  Filled 2023-01-22 (×8): qty 1

## 2023-01-22 MED ORDER — POLYETHYLENE GLYCOL 3350 17 GM/SCOOP PO POWD
17.0000 g | Freq: Two times a day (BID) | ORAL | 1 refills | Status: DC | PRN
Start: 2023-01-22 — End: 2023-01-28

## 2023-01-22 MED ORDER — PROPRANOLOL HCL 20 MG PO TABS: 20.0000 mg | ORAL_TABLET | Freq: Every day | ORAL | 2 refills | Status: DC

## 2023-01-22 MED ORDER — SENNOSIDES-DOCUSATE SODIUM 8.6-50 MG PO TABS
1.0000 | ORAL_TABLET | Freq: Two times a day (BID) | ORAL | Status: DC | PRN
Start: 2023-01-22 — End: 2023-07-02

## 2023-01-22 MED ORDER — ENSURE ENLIVE PO LIQD: 237.0000 mL | Freq: Two times a day (BID) | ORAL | 0 refills | Status: DC

## 2023-01-22 MED ORDER — ENOXAPARIN SODIUM 40 MG/0.4ML IJ SOSY
40.0000 mg | PREFILLED_SYRINGE | INTRAMUSCULAR | Status: DC
  Administered 2023-01-22 – 2023-01-28 (×7): 40 mg via SUBCUTANEOUS
  Filled 2023-01-22 (×6): qty 0.4

## 2023-01-22 MED ORDER — GABAPENTIN 300 MG PO CAPS: 300.0000 mg | ORAL_CAPSULE | Freq: Three times a day (TID) | ORAL | 11 refills | Status: DC

## 2023-01-22 MED ORDER — PROPRANOLOL HCL 20 MG PO TABS: 20.0000 mg | ORAL_TABLET | Freq: Every day | ORAL | Status: DC

## 2023-01-22 NOTE — Progress Notes (Signed)
 Subjective: Pain improving, about 3-4/10. Denies nausea or emesis. Afebrile.  Objective: Vital signs in last 24 hours: Temp:  [97.7 F (36.5 C)-98 F (36.7 C)] 97.7 F (36.5 C) (01/01 0519) Pulse Rate:  [51-82] 51 (01/01 0913) Resp:  [10-44] 15 (01/01 0519) BP: (99-154)/(43-81) 134/47 (01/01 0913) SpO2:  [94 %-100 %] 97 % (01/01 0519)  Intake/Output from previous day: 12/31 0701 - 01/01 0700 In: 1728.3 [P.O.:480; I.V.:1248.3] Out: 1350 [Urine:1350] Intake/Output this shift: Total I/O In: -  Out: 125 [Urine:125] UOP: 1.3L  Physical Exam:  General: Alert and oriented CV: RRR Lungs: Clear Abdomen: Soft, ND, NT Ext: NT, No erythema  Lab Results: Recent Labs    01/20/23 0321 01/21/23 0319 01/22/23 0342  HGB 11.6* 11.2* 10.9*  HCT 36.1* 35.7* 34.8*   BMET Recent Labs    01/21/23 0319 01/22/23 0342  NA 134* 136  K 3.7 3.6  CL 106 107  CO2 19* 22  GLUCOSE 120* 113*  BUN 73* 55*  CREATININE 1.94* 1.56*  CALCIUM  8.3* 8.5*     Studies/Results: IR Fluoro Guide Ndl Plmt / BX Result Date: 01/21/2023 INDICATION: Pathologic fracture. EXAM: FLUOROSCOPY GUIDED T12 CORE BONE BIOPSY MEDICATIONS: None. ANESTHESIA/SEDATION: Moderate (conscious) sedation was employed during this procedure. A total of Versed  1.5 mg and Fentanyl  75 mcg was administered intravenously by the radiology nurse. Total intra-service moderate Sedation Time: 29 minutes. The patient's level of consciousness and vital signs were monitored continuously by radiology nursing throughout the procedure under my direct supervision. Fluoroscopy does: 657 mGy air Kerma. COMPLICATIONS: None immediate. PROCEDURE: Informed written consent was obtained from the patient after a thorough discussion of the procedural risks, benefits and alternatives. All questions were addressed. Maximal Sterile Barrier Technique was utilized including caps, mask, sterile gowns, sterile gloves, sterile drape, hand hygiene and skin  antiseptic. A timeout was performed prior to the initiation of the procedure. The patient was placed in prone position on the angiography table. The thoracic spine region was prepped and draped in a sterile fashion. Under fluoroscopy, the T12 vertebral body was delineated and the skin area was marked. The skin was infiltrated with a 1% Lidocaine  approximately 3 cm lateral to the spinous process projection on the right. Using a 22-gauge spinal needle, the soft issue and the peripedicular space and periosteum were infiltrated with Bupivacaine  0.5%. A skin incision was made at the access site. Subsequently, an 11-gauge Kyphon trocar was inserted under fluoroscopic guidance until contact with the pedicle was obtained. The trocar was inserted under light hammer tapping into the pedicle until the posterior boundaries of the vertebral body was reached. The diamond mandrill was removed and 3 core bone biopsy samples were obtained. The trocar was later removed. The access site was cleaned and covered with a sterile bandage. IMPRESSION: Fluoroscopy guided T12 core bone biopsy performed using tiny fragments on each core biopsy pass. Samples obtained were sent for tissue exam. Electronically Signed   By: [name] M.D.   On: 01/21/2023 12:46   DG Bone Survey Met Result Date: 01/20/2023 CLINICAL DATA:  Lytic bone lesions on x-ray. Multiple metastatic deposits throughout the thoracic spine. EXAM: METASTATIC BONE SURVEY COMPARISON:  Thoracic spine MRI 01/18/2023. Chest abdomen pelvis CT 01/18/2023. FINDINGS: Patient with known lesions in the thoracic and lumbar spine. This was better assessed on recent CT and MRI. The majority of these lesions are not well-defined by radiograph. The known T12 compression fracture is not well assessed on the current exam due to osseous  overlap. The known left iliac sclerotic lesion is not well seen by radiograph. Area of sclerosis projects over the lateral aspect of the  right distal tibia at the ankle joint. Other there are extremity lesion is seen by radiograph. Prior median sternotomy. Blunting of the left costophrenic angle, likely epicardial fat pad and atelectasis. Aortic atherosclerosis and vascular calcifications. IMPRESSION: 1. Patient with known spinal osseous bone lesions. This was better assessed on recent CT and MRI. The majority of these lesions are not well-defined by radiograph. 2. The known T12 compression fracture is not well assessed on the current exam due to osseous overlap. 3. The known left iliac sclerotic lesion is not well seen by radiograph. 4. Area of sclerosis projects over the lateral aspect of the right distal tibia at the ankle joint, indeterminate. Electronically Signed   By: Andrea Gasman M.D.   On: 01/20/2023 22:50   US  RENAL Result Date: 01/20/2023 CLINICAL DATA:  Acute kidney injury EXAM: RENAL / URINARY TRACT ULTRASOUND COMPLETE COMPARISON:  CT 01/18/2023 FINDINGS: Right Kidney: Renal measurements: 9.1 x 4.7 x 4.4 cm = volume: 9.5 mL. Echogenicity within normal limits. No mass or hydronephrosis visualized. Left Kidney: Renal measurements: 11.9 x 5.3 x 5 cm = volume: 165 mL. Echogenicity within normal limits. No mass or hydronephrosis visualized. Bladder: Appears normal for degree of bladder distention. Other: Prostate enlargement at least 5.7 x 5.3 cm. IMPRESSION: 1. Negative for hydronephrosis. 2. Prostate enlargement. Electronically Signed   By: JONETTA Faes M.D.   On: 01/20/2023 16:00    Assessment/Plan: 1. History of low risk nonmuscle invasive bladder cancer:  -DX 2001 with TURBT for a LG Ta UCB  -Last cystoscopy 12/2022 no evidence of disease   2. Possible metastatic prostate cancer: Prostate firm a nodular with evidence of skeletal metastasis on recent imaging. PSA 181 in office in 12/2022. -PSMA PET ordered, but unable to be done as inpatient -Await pathology results from bone biopsy -Tentatively scheduled for prostate  biopsy outpatient this month -Continue bicalutamide  -If evidence of metastatic disease with plan for ADT and oral antiandrogen -Following peripherally  CC: Aloysius Mech, MD    LOS: 2 days   Matt R. Amal Saiki MD 01/22/2023, 9:45 AM [facility]  Pager: [phone]

## 2023-01-22 NOTE — Progress Notes (Signed)
 Orthopedics Progress Note  Subjective: Patient feels better this AM. No complaints of back pain  Objective:  Vitals:   01/21/23 2321 01/22/23 0519  BP: (!) 144/48 (!) 142/55  Pulse: (!) 59 (!) 53  Resp: 19 15  Temp: 98 F (36.7 C) 97.7 F (36.5 C)  SpO2: 95% 97%    General: Awake and alert  Musculoskeletal: No thoracic or lumbar midline tenderness. No deformity. 5/5 motor distally. Sensation intact bilaterally Neurovascularly intact  Lab Results  Component Value Date   WBC 7.1 01/22/2023   HGB 10.9 (L) 01/22/2023   HCT 34.8 (L) 01/22/2023   MCV 91.1 01/22/2023   PLT 246 01/22/2023       Component Value Date/Time   NA 136 01/22/2023 0342   NA 140 01/08/2022 0911   K 3.6 01/22/2023 0342   CL 107 01/22/2023 0342   CO2 22 01/22/2023 0342   GLUCOSE 113 (H) 01/22/2023 0342   BUN 55 (H) 01/22/2023 0342   BUN 33 (H) 01/08/2022 0911   CREATININE 1.56 (H) 01/22/2023 0342   CREATININE 1.09 10/30/2015 1211   CALCIUM  8.5 (L) 01/22/2023 0342   GFRNONAA 43 (L) 01/22/2023 0342   GFRNONAA 64 12/08/2013 1222   GFRAA 58 (L) 12/01/2019 1051   GFRAA 74 12/08/2013 1222    Lab Results  Component Value Date   INR 1.1 01/20/2023   INR 1.36 06/08/2009   INR 1.06 06/07/2009    Assessment/Plan: Low back pain of unknown etiology, improved, Per Dr Burnetta note, no plans for kyphoplasty or spine intervention as he suspects pain due to extensive degenerative changes throughout the T and L spine.  Mobilization with TLSO for support and pain control. Skeletal survey not very helpful for additional characterization of known lesions per CT and MRI.  following Elspeth SAUNDERS. Kay, MD 01/22/2023 8:05 AM

## 2023-01-22 NOTE — Progress Notes (Signed)
 PROGRESS NOTE  William Faulkner FMW:[id number] DOB: [birth year]   PCP: Amon Aloysius BRAVO, MD  Patient is from: Home.  Lives with his wife.  Uses cane at times.  DOA: 01/18/2023 LOS: 2  Chief complaints Chief Complaint  Patient presents with   Back Pain   Nausea   Weakness     Brief Narrative / Interim history: 89 year old M with PMH of CAD/CABG, thoracic cord aneurysm, aortic insufficiency, CKD-3A, CVA, HTN, HLD, bladder cancer and recent diagnosis of vertebral metastasis for which she is followed by orthopedic surgery, presenting with progressive back pain for about 2 months.  No report of trauma or fall.  In ED, CT angio chest, abdomen and pelvis raise concern for subacute and pathologic T12 compression fracture without retropulsion or other complicating features, underlying lytic lesion and progressive loss of height, abnormal bladder base and prostate .  [facility], Dr. Burnetta consulted by EDP.  MRI thoracic spine obtained and showed multiple metastatic deposits throughout the thoracic spine, central disc protrusion at T8-9 without canal or foraminal stenosis, rightward disc protrusion and annular tear at T11-12 without focal stenosis but no pathologic fractures.  Oncology, urology and palliative consulted per recommendation by Ortho.  Patient underwent IR bone biopsy on 12/31.  Patient with AKI likely due to poor p.o. intake, ACE inhibitors and thiazide.  On IV fluid for AKI.SABRA  Subjective: Seen and examined earlier this morning.  No major events overnight of this morning.  Reports improvement in his pain.  He rates his pain 3-4 on a scale of 10.  He denies nausea or vomiting.  Appetite not great.  AKI improved.  Objective: Vitals:   01/21/23 1541 01/21/23 2321 01/22/23 0519 01/22/23 0913  BP: (!) 143/43 (!) 144/48 (!) 142/55 (!) 134/47  Pulse: 72 (!) 59 (!) 53 (!) 51  Resp:  19 15   Temp: 98 F (36.7 C) 98 F (36.7 C) 97.7 F (36.5 C)   TempSrc: Oral     SpO2: 97% 95% 97%    Weight:      Height:        Examination: GENERAL: No apparent distress.  Nontoxic.  Appears frail. HEENT: MMM.  Vision and hearing grossly intact.  NECK: Supple.  No apparent JVD.  RESP:  No IWOB.  Fair aeration bilaterally. CVS:  RRR. Heart sounds normal.  ABD/GI/GU: BS+. Abd soft, NTND.  MSK/EXT:   No apparent deformity. Moves extremities. No edema.  SKIN: no apparent skin lesion or wound NEURO: Awake but not quite alert.  Oriented fairly.  No apparent focal neuro deficit. PSYCH: Calm. Normal affect.   Procedures:  12/31-IR bone biopsy  Microbiology summarized: None  Assessment and plan: Acute on chronic back pain: CT raise concern for pathologic T12 compression fracture.  However, MRI ruled out compression fracture but concerning for multiple mets, central disc protrusion at T8-9 and right Ward disc protrusion and abnormality at T11-12 without focal stenosis or complicating feature likely contributing to patient's pain.  No focal neurosymptoms or bowel or bladder habit change.  Pain improved.  Has not required significant pain meds.  Ever, physically deconditioned. -S/p IR bone biopsy on 12/31.  Pathology pending. -Started on Casodex  for presumed prostate cancer by oncology. -Continue scheduled Tylenol  with as needed oxycodone  and Dilaudid  for pain control -Continue decreased dose of gabapentin . -Bowel regimen -Patient is physically deconditioned.  Family concerned.  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  No pathologic fracture.  Unknown primary.  Patient has history of bladder cancer.  CT shows abnormal bladder base and prostate. -Appreciate input by oncology and urology. -S/p IR bone biopsy-follow-up pathology -Appreciate input by urology-outpatient follow-up   AKI on CKD-3A: Likely due to poor p.o. intake, ACE inhibitor's and HCTZ.  Renal US  without acute finding but  enlarged prostate.  AKI improved with IV fluid. Recent Labs    09/18/22 1102 01/18/23 0822 01/20/23 0321 01/21/23 0319 01/22/23 0342  BUN 31* 38* 61* 73* 55*  CREATININE 1.55* 1.47* 2.44* 1.94* 1.56*  -Continue IV fluid at home cc an hour -Strict intake and output and bladder scan -Continue holding ACE inhibitor's and HCTZ -Recheck in the morning  Acute metabolic encephalopathy: Likely due to dehydration and gabapentin .  He has not required a lot of opiates.  Has no focal neurodeficit.  Improved. -Adjusted pain medications including gabapentin  -Reorientation and delirium precaution -IV fluid hydration  DM-2 with hyperglycemia, neuropathy, hyperlipidemia and CKD-3A: A1c 6.5% on 8/28. Recent Labs  Lab 01/21/23 0757 01/21/23 1757 01/21/23 2323 01/22/23 0736 01/22/23 1134  GLUCAP 122* 143* 102* 114* 115*  -Discontinue metformin  given his poor renal function -Continue SSI-sensitive. -Decrease gabapentin   History of CAD/CABG: No cardiopulmonary symptoms.  Stable -Continue home meds  Essential hypertension/sinus bradycardia: Soft BP with borderline bradycardia. -Continue holding Lotensin , hydralazine  and HCTZ -Decrease propranolol  to 20 mg    Benign essential tremor -Decreased propranolol  in the setting of borderline bradycardia and hypotension   Glaucoma -Continue timolol  drops. -Follow-up with ophthalmology as an outpatient.  Physical deconditioning:  Initially, therapy recommended home health but patient has deconditioned further, and family is concerned.  -Reengage PT/OT   Body mass index is 23.62 kg/m.         DVT prophylaxis:  enoxaparin  (LOVENOX ) injection 40 mg Start: 01/22/23 1200  Code Status: Full code Family Communication: Updated patient's wife, daugther and son at bedside Level of care: Med-Surg Status is: Inpatient The patient will remain inpatient because: Acute back pain, AKI and encephalopathy and physical deconditioning   Final  disposition: To be determined. May need short stay SNF Consultants:  Orthopedic surgery Oncology Urology Palliative medicine Interventional radiology  55 minutes with more than 50% spent in reviewing records and coordinating care.   Sch Meds:  Scheduled Meds:  acetaminophen   1,000 mg Oral Q8H   aspirin  EC  81 mg Oral Daily   bicalutamide   50 mg Oral Daily   cholecalciferol   1,000 Units Oral Daily   enoxaparin  (LOVENOX ) injection  40 mg Subcutaneous Q24H   feeding supplement  237 mL Oral BID BM   gabapentin   200 mg Oral BID   insulin  aspart  0-9 Units Subcutaneous TID WC   nystatin    Topical TID   polyethylene glycol  17 g Oral Daily   [START ON 01/23/2023] propranolol   20 mg Oral Daily   rosuvastatin   20 mg Oral Daily   timolol   1 drop Both Eyes q morning   Continuous Infusions:  sodium chloride  100 mL/hr at 01/22/23 0505   PRN Meds:.HYDROmorphone  (DILAUDID ) injection, naLOXone  (NARCAN )  injection, ondansetron  **OR** ondansetron  (ZOFRAN ) IV, oxyCODONE   Antimicrobials: Anti-infectives (From admission, onward)    None        I have personally reviewed the following labs and images: CBC: Recent Labs  Lab 01/18/23 0822 01/20/23 0321 01/21/23 0319 01/22/23 0342  WBC 7.6 9.8 7.2 7.1  NEUTROABS 5.9  --   --   --   HGB 14.4 11.6* 11.2*  10.9*  HCT 46.0 36.1* 35.7* 34.8*  MCV 91.1 90.0 91.5 91.1  PLT 299 240 230 246   BMP &GFR Recent Labs  Lab 01/18/23 0822 01/20/23 0321 01/21/23 0319 01/22/23 0342  NA 139 135 134* 136  K 4.3 4.2 3.7 3.6  CL 105 103 106 107  CO2 21* 21* 19* 22  GLUCOSE 128* 110* 120* 113*  BUN 38* 61* 73* 55*  CREATININE 1.47* 2.44* 1.94* 1.56*  CALCIUM  10.0 8.9 8.3* 8.5*  MG  --  2.1 2.0 2.0  PHOS  --  3.8 3.5 3.2   Estimated Creatinine Clearance: 39.5 mL/min (A) (by C-G formula based on SCr of 1.56 mg/dL (H)). Liver & Pancreas: Recent Labs  Lab 01/18/23 0822 01/20/23 0321 01/21/23 0319 01/22/23 0342  AST 22  --   --   --   ALT  16  --   --   --   ALKPHOS 85  --   --   --   BILITOT 0.9  --   --   --   PROT 7.6  --   --   --   ALBUMIN 3.6 2.7* 2.6* 2.5*   Recent Labs  Lab 01/18/23 0822  LIPASE 28   No results for input(s): AMMONIA in the last 168 hours. Diabetic: Recent Labs    01/20/23 0325  HGBA1C 6.5*   Recent Labs  Lab 01/21/23 0757 01/21/23 1757 01/21/23 2323 01/22/23 0736 01/22/23 1134  GLUCAP 122* 143* 102* 114* 115*   Cardiac Enzymes: No results for input(s): CKTOTAL, CKMB, CKMBINDEX, TROPONINI in the last 168 hours. No results for input(s): PROBNP in the last 8760 hours. Coagulation Profile: Recent Labs  Lab 01/20/23 1925  INR 1.1   Thyroid  Function Tests: No results for input(s): TSH, T4TOTAL, FREET4, T3FREE, THYROIDAB in the last 72 hours. Lipid Profile: No results for input(s): CHOL, HDL, LDLCALC, TRIG, CHOLHDL, LDLDIRECT in the last 72 hours. Anemia Panel: No results for input(s): VITAMINB12, FOLATE, FERRITIN, TIBC, IRON, RETICCTPCT in the last 72 hours. Urine analysis:    Component Value Date/Time   COLORURINE YELLOW 01/18/2023 0822   APPEARANCEUR CLEAR 01/18/2023 0822   LABSPEC 1.017 01/18/2023 0822   PHURINE 5.0 01/18/2023 0822   GLUCOSEU NEGATIVE 01/18/2023 0822   GLUCOSEU NEGATIVE 03/16/2020 1200   HGBUR MODERATE (A) 01/18/2023 0822   HGBUR negative 05/20/2008 1620   BILIRUBINUR NEGATIVE 01/18/2023 0822   BILIRUBINUR Negative 03/16/2020 1138   KETONESUR 5 (A) 01/18/2023 0822   PROTEINUR 30 (A) 01/18/2023 0822   UROBILINOGEN 0.2 03/16/2020 1200   UROBILINOGEN 0.2 03/16/2020 1138   NITRITE NEGATIVE 01/18/2023 0822   LEUKOCYTESUR NEGATIVE 01/18/2023 0822   Sepsis Labs: Invalid input(s): PROCALCITONIN, LACTICIDVEN  Microbiology: No results found for this or any previous visit (from the past 240 hours).  Radiology Studies: No results found.      Vendela Troung T. Annalena Piatt [facility]  If 7PM-7AM, please  contact night-coverage [email or web address] 01/22/2023, 12:56 PM

## 2023-01-22 NOTE — Plan of Care (Signed)
  Problem: Clinical Measurements: Goal: Respiratory complications will improve Outcome: Progressing   Problem: Activity: Goal: Risk for activity intolerance will decrease Outcome: Progressing   Problem: Nutrition: Goal: Adequate nutrition will be maintained Outcome: Progressing   

## 2023-01-23 ENCOUNTER — Inpatient Hospital Stay (HOSPITAL_COMMUNITY): Payer: Medicare Other

## 2023-01-23 DIAGNOSIS — S22080A Wedge compression fracture of T11-T12 vertebra, initial encounter for closed fracture: Secondary | ICD-10-CM | POA: Diagnosis not present

## 2023-01-23 DIAGNOSIS — I10 Essential (primary) hypertension: Secondary | ICD-10-CM | POA: Diagnosis not present

## 2023-01-23 DIAGNOSIS — C7951 Secondary malignant neoplasm of bone: Secondary | ICD-10-CM | POA: Diagnosis not present

## 2023-01-23 DIAGNOSIS — K8020 Calculus of gallbladder without cholecystitis without obstruction: Secondary | ICD-10-CM | POA: Diagnosis not present

## 2023-01-23 DIAGNOSIS — C61 Malignant neoplasm of prostate: Secondary | ICD-10-CM | POA: Diagnosis not present

## 2023-01-23 DIAGNOSIS — N179 Acute kidney failure, unspecified: Secondary | ICD-10-CM | POA: Diagnosis not present

## 2023-01-23 LAB — CBC
HCT: 35.6 % — ABNORMAL LOW (ref 39.0–52.0)
Hemoglobin: 11.5 g/dL — ABNORMAL LOW (ref 13.0–17.0)
MCH: 29 pg (ref 26.0–34.0)
MCHC: 32.3 g/dL (ref 30.0–36.0)
MCV: 89.7 fL (ref 80.0–100.0)
Platelets: 270 10*3/uL (ref 150–400)
RBC: 3.97 MIL/uL — ABNORMAL LOW (ref 4.22–5.81)
RDW: 13 % (ref 11.5–15.5)
WBC: 7.3 10*3/uL (ref 4.0–10.5)
nRBC: 0 % (ref 0.0–0.2)

## 2023-01-23 LAB — GLUCOSE, CAPILLARY
Glucose-Capillary: 121 mg/dL — ABNORMAL HIGH (ref 70–99)
Glucose-Capillary: 136 mg/dL — ABNORMAL HIGH (ref 70–99)
Glucose-Capillary: 97 mg/dL (ref 70–99)
Glucose-Capillary: 127 mg/dL — ABNORMAL HIGH (ref 70–99)

## 2023-01-23 LAB — RENAL FUNCTION PANEL
Albumin: 2.7 g/dL — ABNORMAL LOW (ref 3.5–5.0)
Anion gap: 9 (ref 5–15)
BUN: 42 mg/dL — ABNORMAL HIGH (ref 8–23)
CO2: 20 mmol/L — ABNORMAL LOW (ref 22–32)
Calcium: 9 mg/dL (ref 8.9–10.3)
Chloride: 107 mmol/L (ref 98–111)
Creatinine, Ser: 1.28 mg/dL — ABNORMAL HIGH (ref 0.61–1.24)
GFR, Estimated: 55 mL/min — ABNORMAL LOW (ref >60)
Glucose, Bld: 99 mg/dL (ref 70–99)
Phosphorus: 2.7 mg/dL (ref 2.5–4.6)
Potassium: 3.7 mmol/L (ref 3.5–5.1)
Sodium: 136 mmol/L (ref 135–145)

## 2023-01-23 LAB — PROTEIN ELECTROPHORESIS, SERUM
A/G Ratio: 1 (ref 0.7–1.7)
Albumin ELP: 2.5 g/dL — ABNORMAL LOW (ref 2.9–4.4)
Alpha-1-Globulin: 0.3 g/dL (ref 0.0–0.4)
Alpha-2-Globulin: 1.1 g/dL — ABNORMAL HIGH (ref 0.4–1.0)
Beta Globulin: 0.7 g/dL (ref 0.7–1.3)
Gamma Globulin: 0.4 g/dL (ref 0.4–1.8)
Globulin, Total: 2.6 g/dL (ref 2.2–3.9)
Total Protein ELP: 5.1 g/dL — ABNORMAL LOW (ref 6.0–8.5)

## 2023-01-23 LAB — MAGNESIUM: Magnesium: 1.8 mg/dL (ref 1.7–2.4)

## 2023-01-23 MED ORDER — GABAPENTIN 300 MG PO CAPS
300.0000 mg | ORAL_CAPSULE | Freq: Two times a day (BID) | ORAL | Status: DC
  Administered 2023-01-23 – 2023-01-28 (×11): 300 mg via ORAL
  Filled 2023-01-23 (×11): qty 1

## 2023-01-23 MED ORDER — PROPRANOLOL HCL 20 MG PO TABS
40.0000 mg | ORAL_TABLET | Freq: Every day | ORAL | Status: DC
  Administered 2023-01-23 – 2023-01-28 (×6): 40 mg via ORAL
  Filled 2023-01-23 (×6): qty 2

## 2023-01-23 NOTE — Progress Notes (Signed)
 PROGRESS NOTE  William Faulkner FMW:[id number] DOB: 06-Dec-1934   PCP: Amon Aloysius BRAVO, MD  Patient is from: Home.  Lives with his wife.  Uses cane at times.  DOA: 01/18/2023 LOS: 3  Chief complaints Chief Complaint  Patient presents with   Back Pain   Nausea   Weakness     Brief Narrative / Interim history: 89 year old M with PMH of CAD/CABG, thoracic cord aneurysm, aortic insufficiency, CKD-3A, CVA, HTN, HLD, bladder cancer and recent diagnosis of vertebral metastasis for which she is followed by orthopedic surgery, presenting with progressive back pain for about 2 months.  No report of trauma or fall.  In ED, CT angio chest, abdomen and pelvis raise concern for subacute and pathologic T12 compression fracture without retropulsion or other complicating features, underlying lytic lesion and progressive loss of height, abnormal bladder base and prostate .  [facility], Dr. Burnetta consulted by EDP.  MRI thoracic spine obtained and showed multiple metastatic deposits throughout the thoracic spine, central disc protrusion at T8-9 without canal or foraminal stenosis, rightward disc protrusion and annular tear at T11-12 without focal stenosis but no pathologic fractures.  Oncology, urology and palliative consulted per recommendation by Ortho.  Patient underwent IR bone biopsy on 12/31.  Started on Casodex  for presumed prostate cancer.  Pathology still pending.  Patient with AKI likely due to poor p.o. intake, ACE inhibitors and thiazide.  AKI resolved with fluid and holding nephrotoxic meds.  Initially, therapy recommended home health but patient is too deconditioned.  Reengaging therapy for reassessment.    Subjective: Seen and examined earlier this morning.  No major events overnight of this morning.  Reports back pain earlier this morning that has improved with pain medication.  No complaints.  Objective: Vitals:   01/22/23 1340 01/22/23 2146 01/23/23 0554 01/23/23 0830  BP: (!) 163/54   (!) 161/72   Pulse: (!) 57 63 92 65  Resp: 17 16 17    Temp: 98.1 F (36.7 C) 98.7 F (37.1 C) 98.2 F (36.8 C)   TempSrc: Oral Oral Oral   SpO2: 96%  95%   Weight:      Height:        Examination: GENERAL: No apparent distress.  Nontoxic.  Appears frail. HEENT: MMM.  Vision and hearing grossly intact.  NECK: Supple.  No apparent JVD.  RESP:  No IWOB.  Fair aeration bilaterally. CVS:  RRR. Heart sounds normal.  ABD/GI/GU: BS+. Abd soft, NTND.  MSK/EXT:   No apparent deformity. Moves extremities. No edema.  SKIN: no apparent skin lesion or wound NEURO: Sleepy but wakes to voice.  Fairly oriented.  No apparent focal neuro deficit. PSYCH: Calm. Normal affect.   Procedures:  12/31-IR bone biopsy  Microbiology summarized: None  Assessment and plan: Acute on chronic back pain: CT raise concern for pathologic T12 compression fracture.  However, MRI ruled out compression fracture but concerning for multiple mets, central disc protrusion at T8-9 and right Ward disc protrusion and abnormality at T11-12 without focal stenosis or complicating feature likely contributing to patient's pain.  No focal neurosymptoms or bowel or bladder habit change.  Pain improved.  Has not required significant pain meds.  Ever, physically deconditioned. -S/p IR bone biopsy on 12/31.  Pathology pending. -Started on Casodex  for presumed prostate cancer by oncology. -Continue scheduled Tylenol  with as needed oxycodone  and Dilaudid  for pain control -Continue decreased dose of gabapentin . -Bowel regimen -Patient is physically deconditioned.  Family concerned.  Will reengage PT/OT  Vertebral metastatic disease  to thoracic spine: CT and MRI with multiple metastatic deposits throughout the thoracic spine.  No pathologic fracture.  Unknown primary.  Patient has history of bladder cancer.  CT shows abnormal bladder base and prostate. -Appreciate input by oncology and urology. -S/p IR bone biopsy-follow-up  pathology -Appreciate input by urology-outpatient follow-up   AKI on CKD-3A: Likely due to poor p.o. intake, ACE inhibitor's and HCTZ.  Renal US  without acute finding but enlarged prostate.  AKI improved with IV fluid. Recent Labs    09/18/22 1102 01/18/23 0822 01/20/23 0321 01/21/23 0319 01/22/23 0342 01/23/23 0356  BUN 31* 38* 61* 73* 55* 42*  CREATININE 1.55* 1.47* 2.44* 1.94* 1.56* 1.28*  -Monitor off IV fluid. -Strict intake and output and bladder scan -Continue holding ACE inhibitor's and HCTZ -Encourage oral fluid.  Acute metabolic encephalopathy: Likely due to dehydration and gabapentin .  He has not required a lot of opiates.  Has no focal neurodeficit.  Improved. -Adjusted pain medications including gabapentin  -Reorientation and delirium precaution   DM-2 with hyperglycemia, neuropathy, hyperlipidemia and CKD-3A: A1c 6.5% on 8/28. Recent Labs  Lab 01/22/23 1134 01/22/23 1648 01/22/23 2137 01/23/23 0808 01/23/23 1346  GLUCAP 115* 154* 87 121* 136*  -Continue SSI-sensitive. -Decrease gabapentin  for his renal function  History of CAD/CABG: No cardiopulmonary symptoms.  Stable -Continue home meds  Essential hypertension/sinus bradycardia: Bradycardia resolved.  BP slightly elevated today -Continue holding Lotensin , hydralazine  and HCTZ -Increase propranolol  to home dose   Benign essential tremor -Continue home propranolol .   Glaucoma -Continue timolol  drops. -Follow-up with ophthalmology as an outpatient.  Physical deconditioning:  Initially, therapy recommended home health but patient has deconditioned further, and family is concerned.  -Reengage PT/OT   Body mass index is 23.62 kg/m.         DVT prophylaxis:  enoxaparin  (LOVENOX ) injection 40 mg Start: 01/22/23 1200  Code Status: Full code Family Communication: None at bedside today Level of care: Med-Surg Status is: Inpatient The patient will remain inpatient because: Acute back pain, AKI and  encephalopathy and physical deconditioning   Final disposition: To be determined. May need short stay SNF Consultants:  Orthopedic surgery Oncology Urology Palliative medicine Interventional radiology  55 minutes with more than 50% spent in reviewing records and coordinating care.   Sch Meds:  Scheduled Meds:  acetaminophen   1,000 mg Oral Q8H   aspirin  EC  81 mg Oral Daily   bicalutamide   50 mg Oral Daily   cholecalciferol   1,000 Units Oral Daily   enoxaparin  (LOVENOX ) injection  40 mg Subcutaneous Q24H   feeding supplement  237 mL Oral BID BM   gabapentin   300 mg Oral BID   guaiFENesin   600 mg Oral BID   insulin  aspart  0-9 Units Subcutaneous TID WC   lidocaine   1 patch Transdermal Q24H   nystatin    Topical TID   polyethylene glycol  17 g Oral Daily   propranolol   40 mg Oral Daily   rosuvastatin   20 mg Oral Daily   timolol   1 drop Both Eyes q morning   Continuous Infusions:   PRN Meds:.HYDROmorphone  (DILAUDID ) injection, naLOXone  (NARCAN )  injection, ondansetron  **OR** ondansetron  (ZOFRAN ) IV, oxyCODONE   Antimicrobials: Anti-infectives (From admission, onward)    None        I have personally reviewed the following labs and images: CBC: Recent Labs  Lab 01/18/23 0822 01/20/23 0321 01/21/23 0319 01/22/23 0342 01/23/23 0356  WBC 7.6 9.8 7.2 7.1 7.3  NEUTROABS 5.9  --   --   --   --  HGB 14.4 11.6* 11.2* 10.9* 11.5*  HCT 46.0 36.1* 35.7* 34.8* 35.6*  MCV 91.1 90.0 91.5 91.1 89.7  PLT 299 240 230 246 270   BMP &GFR Recent Labs  Lab 01/18/23 0822 01/20/23 0321 01/21/23 0319 01/22/23 0342 01/23/23 0356  NA 139 135 134* 136 136  K 4.3 4.2 3.7 3.6 3.7  CL 105 103 106 107 107  CO2 21* 21* 19* 22 20*  GLUCOSE 128* 110* 120* 113* 99  BUN 38* 61* 73* 55* 42*  CREATININE 1.47* 2.44* 1.94* 1.56* 1.28*  CALCIUM  10.0 8.9 8.3* 8.5* 9.0  MG  --  2.1 2.0 2.0 1.8  PHOS  --  3.8 3.5 3.2 2.7   Estimated Creatinine Clearance: 48.2 mL/min (A) (by C-G formula  based on SCr of 1.28 mg/dL (H)). Liver & Pancreas: Recent Labs  Lab 01/18/23 9177 01/20/23 0321 01/21/23 0319 01/22/23 0342 01/23/23 0356  AST 22  --   --   --   --   ALT 16  --   --   --   --   ALKPHOS 85  --   --   --   --   BILITOT 0.9  --   --   --   --   PROT 7.6  --   --   --   --   ALBUMIN 3.6 2.7* 2.6* 2.5* 2.7*   Recent Labs  Lab 01/18/23 0822  LIPASE 28   No results for input(s): AMMONIA in the last 168 hours. Diabetic: No results for input(s): HGBA1C in the last 72 hours.  Recent Labs  Lab 01/22/23 1134 01/22/23 1648 01/22/23 2137 01/23/23 0808 01/23/23 1346  GLUCAP 115* 154* 87 121* 136*   Cardiac Enzymes: No results for input(s): CKTOTAL, CKMB, CKMBINDEX, TROPONINI in the last 168 hours. No results for input(s): PROBNP in the last 8760 hours. Coagulation Profile: Recent Labs  Lab 01/20/23 1925  INR 1.1   Thyroid  Function Tests: No results for input(s): TSH, T4TOTAL, FREET4, T3FREE, THYROIDAB in the last 72 hours. Lipid Profile: No results for input(s): CHOL, HDL, LDLCALC, TRIG, CHOLHDL, LDLDIRECT in the last 72 hours. Anemia Panel: No results for input(s): VITAMINB12, FOLATE, FERRITIN, TIBC, IRON, RETICCTPCT in the last 72 hours. Urine analysis:    Component Value Date/Time   COLORURINE YELLOW 01/18/2023 0822   APPEARANCEUR CLEAR 01/18/2023 0822   LABSPEC 1.017 01/18/2023 0822   PHURINE 5.0 01/18/2023 0822   GLUCOSEU NEGATIVE 01/18/2023 0822   GLUCOSEU NEGATIVE 03/16/2020 1200   HGBUR MODERATE (A) 01/18/2023 0822   HGBUR negative 05/20/2008 1620   BILIRUBINUR NEGATIVE 01/18/2023 0822   BILIRUBINUR Negative 03/16/2020 1138   KETONESUR 5 (A) 01/18/2023 0822   PROTEINUR 30 (A) 01/18/2023 0822   UROBILINOGEN 0.2 03/16/2020 1200   UROBILINOGEN 0.2 03/16/2020 1138   NITRITE NEGATIVE 01/18/2023 0822   LEUKOCYTESUR NEGATIVE 01/18/2023 0822   Sepsis Labs: Invalid input(s): PROCALCITONIN,  LACTICIDVEN  Microbiology: No results found for this or any previous visit (from the past 240 hours).  Radiology Studies: No results found.      Braylyn Eye T. Edwinna Rochette [facility]  If 7PM-7AM, please contact night-coverage [email or web address] 01/23/2023, 3:48 PM

## 2023-01-23 NOTE — Progress Notes (Signed)
 Physical Therapy Treatment Patient Details Name: William Faulkner MRN: [id number] DOB: 1934-09-09 Today's Date: 01/23/2023   History of Present Illness 89 y.o. male with medical history significant of benign essential tremor, BPH, cholelithiasis, type 2 diabetes, CAD/CABG, aortic aneurysm, arctic insufficiency, hyperlipidemia, hypertension, left rotator cuff tear, diabetic peripheral neuropathy bladder cancer, cerebrovascular disease, stage 3a CKD who presented with history of 2 months of back pain, nausea and weakness. Admitted on 01/18/23 with complaints of increased back pain. CT: Positive for subacute and pathologic appearing T12 compression fracture, with underlying lytic lesion and loss of height progressed since November. MRI: negative    PT Comments  Pt continues cooperative but requiring increased time and assist for all tasks and limited by pain/fatigue to ambulating short distance in room to transfer to chair.  Pt's hope is to dc to home but Patient will benefit from continued inpatient follow up therapy, <3 hours/day to increase safety and IND for return home.     If plan is discharge home, recommend the following: A lot of help with walking and/or transfers;A lot of help with bathing/dressing/bathroom;Assistance with cooking/housework;Assist for transportation;Help with stairs or ramp for entrance   Can travel by private vehicle     No  Equipment Recommendations  Rolling walker (2 wheels)    Recommendations for Other Services       Precautions / Restrictions Precautions Precautions: Back Precaution Booklet Issued: No Precaution Comments: Verbal education provided on BLT back precautions during evaluation. Required Braces or Orthoses: Spinal Brace Spinal Brace: Thoracolumbosacral orthotic;Applied in sitting position Restrictions Weight Bearing Restrictions Per Provider Order: No     Mobility  Bed Mobility Overal bed mobility: Needs Assistance Bed Mobility: Rolling,  Sidelying to Sit Rolling: Min assist Sidelying to sit: Mod assist       General bed mobility comments: cues for log roll, assist to elevate trunk, incr time; use of bed rail    Transfers Overall transfer level: Needs assistance Equipment used: Rolling walker (2 wheels) Transfers: Sit to/from Stand Sit to Stand: Mod assist, From elevated surface   Step pivot transfers: Min assist       General transfer comment: cues for hand placement, assist to power up and extend hips/trunk. pt tends to stand withrounded shoulders, head flexed, trunk flexed; step pvt bed to chair    Ambulation/Gait Ambulation/Gait assistance: Min assist, Mod assist, +2 safety/equipment Gait Distance (Feet): 4 Feet Assistive device: Rolling walker (2 wheels) Gait Pattern/deviations: Decreased step length - left, Decreased dorsiflexion - left, Knee flexed in stance - right, Knee flexed in stance - left, Knees buckling, Trunk flexed, Shuffle Gait velocity: decr     General Gait Details: cues for posture, positioin from RW and safety; assist for stability and RW management; pt ltd by fatigue/pain   Stairs             Wheelchair Mobility     Tilt Bed    Modified Rankin (Stroke Patients Only)       Balance Overall balance assessment: Needs assistance Sitting-balance support: Feet supported, Single extremity supported Sitting balance-Leahy Scale: Poor Sitting balance - Comments: repeated cues and intermittent assist for midline   Standing balance support: Bilateral upper extremity supported, During functional activity, Reliant on assistive device for balance Standing balance-Leahy Scale: Poor                              Cognition Arousal: Alert Behavior During Therapy: [facility] for tasks assessed/performed  Overall Cognitive Status: Within Functional Limits for tasks assessed                                          Exercises      General Comments         Pertinent Vitals/Pain Pain Assessment Pain Assessment: 0-10 Pain Score: 7  Pain Location: back Pain Descriptors / Indicators: Aching, Sore, Grimacing Pain Intervention(s): Limited activity within patient's tolerance, Monitored during session, Patient requesting pain meds-RN notified, RN gave pain meds during session    Home Living                          Prior Function            PT Goals (current goals can now be found in the care plan section) Acute Rehab PT Goals Patient Stated Goal: Regain IND and return home PT Goal Formulation: With patient Time For Goal Achievement: 02/02/23 Potential to Achieve Goals: Good Progress towards PT goals: Progressing toward goals    Frequency    Min 1X/week      PT Plan      Co-evaluation PT/OT/SLP Co-Evaluation/Treatment: Yes Reason for Co-Treatment: To address functional/ADL transfers          AM-PAC PT 6 Clicks Mobility   Outcome Measure  Help needed turning from your back to your side while in a flat bed without using bedrails?: A Little Help needed moving from lying on your back to sitting on the side of a flat bed without using bedrails?: A Lot Help needed moving to and from a bed to a chair (including a wheelchair)?: A Lot Help needed standing up from a chair using your arms (e.g., wheelchair or bedside chair)?: A Lot Help needed to walk in hospital room?: A Lot Help needed climbing 3-5 steps with a railing? : Total 6 Click Score: 12    End of Session Equipment Utilized During Treatment: Gait belt Activity Tolerance: Patient limited by fatigue;Patient limited by pain Patient left: in chair;with call bell/phone within reach;with chair alarm set;with family/visitor present;with nursing/sitter in room Nurse Communication: Mobility status PT Visit Diagnosis: Unsteadiness on feet (R26.81);Muscle weakness (generalized) (M62.81);Difficulty in walking, not elsewhere classified (R26.2);Pain     Time:  8681-8651 PT Time Calculation (min) (ACUTE ONLY): 30 min  Charges:    $Therapeutic Activity: 8-22 mins PT General Charges $$ ACUTE PT VISIT: 1 Visit                     Katrinka Acton PT Acute Rehabilitation Services Pager [phone] Office [phone]    [facility] 01/23/2023, 4:49 PM

## 2023-01-23 NOTE — Evaluation (Signed)
 Occupational Therapy Re-evaluation Patient Details Name: William Faulkner MRN: [id number] DOB: 10/15/1934 Today's Date: 01/23/2023   History of Present Illness 89 yr old male who presented 01-18-23 with history of 2 months of back pain, nausea and weakness. Imaging revealed subacute and pathologic appearing T12 compression fracture, with underlying lytic lesion and loss of height progressed since November/diffuse bony lesions. Biopsy completed. PMH: benign essential tremor, BPH, cholelithiasis, type 2 diabetes, CAD/CABG, aortic aneurysm, aortic insufficiency, hyperlipidemia, hypertension, left rotator cuff tear, diabetic peripheral neuropathy bladder cancer, cerebrovascular disease, stage 3a CKD   Clinical Impression   The pt was initially evaluated by OT during this hospital stay and subsequently signed off on. Pt is now with change in functional status, requiring increased assist for self-care tasks, as he is with increased back pain, deconditioning, and compromised activity tolerance. As such, OT re-eval completed this date with the need for further OT service in the acute care setting identified. OT also recommends the pt receive continued inpatient follow up therapy, <3 hours/day, post-hospital discharge, in order to decrease the risk for caregiver burden and to assist the pt with making maximal functional gains with regards to self-care management.         If plan is discharge home, recommend the following: A lot of help with walking and/or transfers;A lot of help with bathing/dressing/bathroom;Help with stairs or ramp for entrance;Assist for transportation;Assistance with cooking/housework    Functional Status Assessment  Patient has had a recent decline in their functional status and demonstrates the ability to make significant improvements in function in a reasonable and predictable amount of time.  Equipment Recommendations  BSC/3in1    Recommendations for Other Services        Precautions / Restrictions Precautions Precautions: Back Precaution Booklet Issued: No Precaution Comments:  Required Braces or Orthoses: Spinal Brace Spinal Brace: Thoracolumbosacral orthotic;Applied in sitting position Restrictions Weight Bearing Restrictions Per Provider Order: No Other Position/Activity Restrictions: spinal precautions      Mobility Bed Mobility Overal bed mobility: Needs Assistance Bed Mobility: Rolling, Sidelying to Sit Rolling: Min assist Sidelying to sit: Mod assist Supine to sit: HOB elevated, Used rails, Mod assist     General bed mobility comments: cues for log roll, assist to elevate trunk, incr time; use of bed rail    Transfers Overall transfer level: Needs assistance Equipment used: Rolling walker (2 wheels) Transfers: Sit to/from Stand Sit to Stand: Mod assist, From elevated surface     Step pivot transfers: Min assist     General transfer comment: cues for hand placement, assist to power up and extend hips/trunk. pt tends to stand withrounded shoulders, head flexed, trunk flexed; step pvt bed to chair      Balance     Sitting balance-Leahy Scale: Fair Sitting balance - Comments: repeated cues and intermittent assist for midline     Standing balance-Leahy Scale: Poor           ADL either performed or assessed with clinical judgement   ADL Overall ADL's : Needs assistance/impaired Eating/Feeding: Set up;Sitting   Grooming: Set up;Sitting Grooming Details (indicate cue type and reason): He performed face washing and teeth brushing in sitting at chair level.         Upper Body Dressing : Maximal assistance;Sitting Upper Body Dressing Details (indicate cue type and reason): He required max assist to don back brace seated EOB. Lower Body Dressing: Maximal assistance Lower Body Dressing Details (indicate cue type and reason): Pt limited by increased back pain and  deconditioning. Significant assist needed for sock management  in sitting.                     Vision Baseline Vision/History: 3 Glaucoma              Pertinent Vitals/Pain Pain Assessment Pain Assessment: 0-10 Pain Score: 7  Pain Location: back Pain Descriptors / Indicators: Aching, Sore, Grimacing Pain Intervention(s): Limited activity within patient's tolerance, Monitored during session, RN gave pain meds during session, Repositioned     Extremity/Trunk Assessment Upper Extremity Assessment Upper Extremity Assessment: Overall WFL for tasks assessed LUE Deficits / Details: History of Left shoulder RTC tear   Lower Extremity Assessment Lower Extremity Assessment: Generalized weakness       Communication Communication Communication: No apparent difficulties   Cognition Arousal: Alert Behavior During Therapy: WFL for tasks assessed/performed Overall Cognitive Status: Within Functional Limits for tasks assessed      General Comments        Home Living Family/patient expects to be discharged to:: Private residence Living Arrangements: Spouse/significant other Available Help at Discharge: Family Type of Home: House Home Access: Stairs to enter [employer] of Steps: 3 from garage Entrance Stairs-Rails: Right Home Layout: Two level;Able to live on main level with bedroom/bathroom Alternate Level Stairs-Number of Steps: flight   Bathroom Shower/Tub: Walk-in shower                    Prior Functioning/Environment Prior Level of Function : Driving;Independent/Modified Independent             Mobility Comments: Use walking stick          OT Problem List: Decreased strength;Decreased activity tolerance;Decreased knowledge of use of DME or AE;Decreased knowledge of precautions;Impaired balance (sitting and/or standing);Impaired sensation;Pain      OT Treatment/Interventions: Self-care/ADL training;Therapeutic exercise;Energy conservation;DME and/or AE instruction;Balance training;Therapeutic  activities;Patient/family education    OT Goals(Current goals can be found in the care plan section) Acute Rehab OT Goals Patient Stated Goal: decreased pain OT Goal Formulation: With patient Time For Goal Achievement: 02/06/23 Potential to Achieve Goals: Good ADL Goals Pt Will Perform Upper Body Dressing: with set-up;sitting Pt Will Perform Lower Body Dressing: with contact guard assist;sitting/lateral leans;sit to/from stand;with adaptive equipment Pt Will Transfer to Toilet: with contact guard assist;ambulating;grab bars Pt Will Perform Toileting - Clothing Manipulation and hygiene: with contact guard assist;sit to/from stand  OT Frequency: Min 1X/week    Co-evaluation PT/OT/SLP Co-Evaluation/Treatment: Yes Reason for Co-Treatment: To address functional/ADL transfers PT goals addressed during session: Mobility/safety with mobility OT goals addressed during session: ADL's and self-care      AM-PAC OT 6 Clicks Daily Activity     Outcome Measure Help from another person eating meals?: A Little Help from another person taking care of personal grooming?: A Little Help from another person toileting, which includes using toliet, bedpan, or urinal?: A Lot Help from another person bathing (including washing, rinsing, drying)?: A Lot Help from another person to put on and taking off regular upper body clothing?: A Little Help from another person to put on and taking off regular lower body clothing?: A Lot 6 Click Score: 15   End of Session Equipment Utilized During Treatment: Gait belt;Rolling walker (2 wheels) Nurse Communication: Mobility status;Patient requests pain meds  Activity Tolerance: Patient limited by pain Patient left: in chair;with call bell/phone within reach;with family/visitor present  OT Visit Diagnosis: Unsteadiness on feet (R26.81);Muscle weakness (generalized) (M62.81);Pain  Time: 8685-8653 OT Time Calculation (min): 32 min Charges:  OT General  Charges $OT Visit: 1 Visit OT Evaluation $OT Re-eval: 1 Re-eval    Delanna LITTIE Molt, OTR/L 01/23/2023, 5:02 PM

## 2023-01-23 NOTE — Progress Notes (Signed)
 William Faulkner   DOB:06-28-1934   FM#:[id number]    ASSESSMENT & PLAN:  89 year old male with history of diabetes, CAD/CABG, hypertension, glaucoma presented with back pain found to have diffuse bony lesions.  We discussed the results in the setting of bone lesions with elevated PSA of 261. Biopsy was on done and he started bicalutamide  in the meantime.   More coughing today. Afebrile.  Chest X-Ray ordered continue bicalutamide  50 mg daily Pain control per primary Follow-up with pathology Counseled on increase fluid intake   Thank you for the consult. Will follow with you.  All questions were answered.   Thank you for the consult. Will follow with you. William [name], MD 01/23/2023 3:56 PM  Subjective:  William Faulkner reports feeling the same. Coughing. No fever, chills or short of breath. Pain is under controlled with oxycodone  but some drowsiness.  Objective:  Vitals:   01/23/23 0554 01/23/23 0830  BP: (!) 161/72   Pulse: 92 65  Resp: 17   Temp: 98.2 F (36.8 C)   SpO2: 95%      Intake/Output Summary (Last 24 hours) at 01/23/2023 1556 Last data filed at 01/23/2023 1053 Gross per 24 hour  Intake 240 ml  Output 700 ml  Net -460 ml    GENERAL: alert, no distress and comfortable SKIN: skin color normal EYES: normal, sclera clear NECK: supple, no palpable mass LYMPH:  no palpable cervical lymphadenopathy LUNGS: No wheeze, rales and clear to auscultation bilaterally with normal breathing effort.  HEART: regular rate & rhythm  ABDOMEN: abdomen soft, non-tender and non-distended Musculoskeletal: no lower extremity edema NEURO: alert with fluent speech   Labs:  Recent Labs    01/18/23 0822 01/20/23 0321 01/21/23 0319 01/22/23 0342 01/23/23 0356  NA 139   < > 134* 136 136  K 4.3   < > 3.7 3.6 3.7  CL 105   < > 106 107 107  CO2 21*   < > 19* 22 20*  GLUCOSE 128*   < > 120* 113* 99  BUN 38*   < > 73* 55* 42*  CREATININE 1.47*   < > 1.94* 1.56* 1.28*  CALCIUM  10.0   < >  8.3* 8.5* 9.0  GFRNONAA 46*   < > 33* 43* 55*  PROT 7.6  --   --   --   --   ALBUMIN 3.6   < > 2.6* 2.5* 2.7*  AST 22  --   --   --   --   ALT 16  --   --   --   --   ALKPHOS 85  --   --   --   --   BILITOT 0.9  --   --   --   --    < > = values in this interval not displayed.    Studies:  IR Fluoro Guide Ndl Plmt / BX Result Date: 01/21/2023 INDICATION: Pathologic fracture. EXAM: FLUOROSCOPY GUIDED T12 CORE BONE BIOPSY MEDICATIONS: None. ANESTHESIA/SEDATION: Moderate (conscious) sedation was employed during this procedure. A total of Versed  1.5 mg and Fentanyl  75 mcg was administered intravenously by the radiology nurse. Total intra-service moderate Sedation Time: 29 minutes. The patient's level of consciousness and vital signs were monitored continuously by radiology nursing throughout the procedure under my direct supervision. Fluoroscopy does: 657 mGy air Kerma. COMPLICATIONS: None immediate. PROCEDURE: Informed written consent was obtained from the patient after a thorough discussion of the procedural risks, benefits and alternatives. All questions were addressed.  Maximal Sterile Barrier Technique was utilized including caps, mask, sterile gowns, sterile gloves, sterile drape, hand hygiene and skin antiseptic. A timeout was performed prior to the initiation of the procedure. The patient was placed in prone position on the angiography table. The thoracic spine region was prepped and draped in a sterile fashion. Under fluoroscopy, the T12 vertebral body was delineated and the skin area was marked. The skin was infiltrated with a 1% Lidocaine  approximately 3 cm lateral to the spinous process projection on the right. Using a 22-gauge spinal needle, the soft issue and the peripedicular space and periosteum were infiltrated with Bupivacaine  0.5%. A skin incision was made at the access site. Subsequently, an 11-gauge Kyphon trocar was inserted under fluoroscopic guidance until contact with the pedicle  was obtained. The trocar was inserted under light hammer tapping into the pedicle until the posterior boundaries of the vertebral body was reached. The diamond mandrill was removed and 3 core bone biopsy samples were obtained. The trocar was later removed. The access site was cleaned and covered with a sterile bandage. IMPRESSION: Fluoroscopy guided T12 core bone biopsy performed using tiny fragments on each core biopsy pass. Samples obtained were sent for tissue exam. Electronically Signed   By: [name] M.D.   On: 01/21/2023 12:46   DG Bone Survey Met Result Date: 01/20/2023 CLINICAL DATA:  Lytic bone lesions on x-ray. Multiple metastatic deposits throughout the thoracic spine. EXAM: METASTATIC BONE SURVEY COMPARISON:  Thoracic spine MRI 01/18/2023. Chest abdomen pelvis CT 01/18/2023. FINDINGS: Patient with known lesions in the thoracic and lumbar spine. This was better assessed on recent CT and MRI. The majority of these lesions are not well-defined by radiograph. The known T12 compression fracture is not well assessed on the current exam due to osseous overlap. The known left iliac sclerotic lesion is not well seen by radiograph. Area of sclerosis projects over the lateral aspect of the right distal tibia at the ankle joint. Other there are extremity lesion is seen by radiograph. Prior median sternotomy. Blunting of the left costophrenic angle, likely epicardial fat pad and atelectasis. Aortic atherosclerosis and vascular calcifications. IMPRESSION: 1. Patient with known spinal osseous bone lesions. This was better assessed on recent CT and MRI. The majority of these lesions are not well-defined by radiograph. 2. The known T12 compression fracture is not well assessed on the current exam due to osseous overlap. 3. The known left iliac sclerotic lesion is not well seen by radiograph. 4. Area of sclerosis projects over the lateral aspect of the right distal tibia at the ankle joint,  indeterminate. Electronically Signed   By: Andrea Gasman M.D.   On: 01/20/2023 22:50   US  RENAL Result Date: 01/20/2023 CLINICAL DATA:  Acute kidney injury EXAM: RENAL / URINARY TRACT ULTRASOUND COMPLETE COMPARISON:  CT 01/18/2023 FINDINGS: Right Kidney: Renal measurements: 9.1 x 4.7 x 4.4 cm = volume: 9.5 mL. Echogenicity within normal limits. No mass or hydronephrosis visualized. Left Kidney: Renal measurements: 11.9 x 5.3 x 5 cm = volume: 165 mL. Echogenicity within normal limits. No mass or hydronephrosis visualized. Bladder: Appears normal for degree of bladder distention. Other: Prostate enlargement at least 5.7 x 5.3 cm. IMPRESSION: 1. Negative for hydronephrosis. 2. Prostate enlargement. Electronically Signed   By: JONETTA Faes M.D.   On: 01/20/2023 16:00   MR THORACIC SPINE WO CONTRAST Result Date: 01/18/2023 CLINICAL DATA:  Bladder cancer. Evaluate for metastatic disease to the thoracic spine. Chronic back pain. Weakness beginning approximately 1  month ago. EXAM: MRI THORACIC SPINE WITHOUT CONTRAST TECHNIQUE: Multiplanar, multisequence MR imaging of the thoracic spine was performed. No intravenous contrast was administered. COMPARISON:  CT angio chest, abdomen and pelvis 01/18/2023. FINDINGS: Alignment: No significant listhesis is present. Mild rightward curvature is centered at T8-9. Thoracic kyphosis is intact. Vertebrae: Multiple metastatic deposits are present throughout the thoracic spine. A 6 mm lesion is present within the right pedicle of T1. An 8 mm lesion is present posterior elements of T2. Anterior lesion at T2 measures 13 mm. Metastatic lesions are present within the T3 vertebral body and the spinous process of T3. A right superior lesion at T5 extends to the pedicle. Rounded metastases at T7 measure 10 and 11 mm. Larger metastases at T8 extend from the superior to inferior endplate, 18 mm. Similar larger lesions are present throughout the remainder of the lower thoracic spine. A  right T11 lesion extends into the right pedicle. Diffuse metastatic infiltration is present in the T12 vertebral body. No pathologic fractures are present. Extensive infiltration of the L1 vertebral body is present. Cord:  Normal signal and morphology. Paraspinal and other soft tissues: See CT report from the same day for further description of metastatic disease outside the spine. Disc levels: A central disc protrusion at T8-9 effaces the ventral CSF. The foramina are patent bilaterally. No significant disc disease or stenosis is present above this level. T9-10: Negative. T10-11: Negative. T11-12: A rightward disc protrusion and annular tear is present without focal stenosis. T12-L1: Negative. IMPRESSION: 1. Multiple metastatic deposits throughout the thoracic spine as described. 2. No pathologic fractures. 3. Central disc protrusion at T8-9 effaces the ventral CSF. 4. Rightward disc protrusion and annular tear at T11-12 without focal stenosis. Electronically Signed   By: Lonni Necessary M.D.   On: 01/18/2023 19:20   CT Angio Chest/Abd/Pel for Dissection W and/or Wo Contrast Result Date: 01/18/2023 CLINICAL DATA:  89 year old male with pain, weakness. Known mild aneurysmal dilatation of the ascending thoracic aorta. Bladder cancer. EXAM: CT ANGIOGRAPHY CHEST, ABDOMEN AND PELVIS TECHNIQUE: Non-contrast CT of the chest was initially obtained. Multidetector CT imaging through the chest, abdomen and pelvis was performed using the standard protocol during bolus administration of intravenous contrast. Multiplanar reconstructed images and MIPs were obtained and reviewed to evaluate the vascular anatomy. RADIATION DOSE REDUCTION: This exam was performed according to the departmental dose-optimization program which includes automated exposure control, adjustment of the mA and/or kV according to patient size and/or use of iterative reconstruction technique. CONTRAST:  75mL OMNIPAQUE  IOHEXOL  350 MG/ML SOLN  COMPARISON:  CTA chest 08/02/2022 and earlier. Restaging CT Abdomen and Pelvis 12/11/2022 FINDINGS: CTA CHEST FINDINGS Cardiovascular: Previous CABG. Extensive Calcified aortic atherosclerosis. Tortuous ascending aorta is stable, smoothly tapers in the arch. Negative for thoracic aortic dissection, periaortic hematoma. Heart size is stable, within normal limits. No pericardial effusion. Little contrast in the pulmonary arteries. Proximal great vessel atherosclerosis appears stable since July. Mediastinum/Nodes: Negative for mediastinal hematoma, mass, lymphadenopathy. Lungs/Pleura: Stable lung volumes since July. Patchy lung base atelectasis or scarring has not significantly changed. No pleural effusion. Small volume new retained secretions in the trachea just below the thoracic inlet on series 4 image 30. But elsewhere the major airways are patent. No active pulmonary inflammation is identified. Musculoskeletal: Chronic sternotomy. Chronic right 2nd rib deformity. T1 through T11 thoracic vertebrae appear stable since July. T12 compression fracture with mild comminution is new since July but was present, subtle in November when other skeletal metastatic disease was suspected. Central T12  loss of vertebral body height up to 30% (series 6, image 105). Lucency within the T12 body appears progressed since July. No retropulsion. T12 pedicles and posterior elements appear intact and aligned. There is mild anterior and lateral prevertebral soft tissue or edema which is increased since November on series 11, image 56. No epidural tumor by CT. Review of the MIP images confirms the above findings. CTA ABDOMEN AND PELVIS FINDINGS VASCULAR Aortoiliac calcified atherosclerosis. Extensive aortic and branch calcified atherosclerosis but the major arterial structures in the abdomen and pelvis remain patent. Negative for abdominal aortic dissection or aneurysm. Review of the MIP images confirms the above findings. NON-VASCULAR  Hepatobiliary: Stable liver. Cholelithiasis. No gallbladder inflammation by CT. Pancreas: Diminutive. Spleen: Diminutive. Adrenals/Urinary Tract: Normal adrenal glands. Nonobstructed kidneys with symmetric renal enhancement. No hydroureter. Abnormal prostate at the base of the bladder as below. Diminutive bladder similar to the November CT. Stomach/Bowel: Decompressed large bowel from the splenic flexure distally but transverse colon is redundant with moderate retained gas and stool, increased since November. Low-density retained stool in the right colon. Cecum is on a lax mesentery. No dilated small bowel. No large bowel inflammation identified. Stomach and duodenum appear negative. No free air or free fluid. Lymphatic: No lymphadenopathy identified in the abdomen or pelvis. Reproductive: Abnormally heterogeneous, lobulated, masslike appearance of the prostate on series 10, image 203 not significantly changed from the restaging CT last month (please see that report) Other: No pelvis free fluid. Musculoskeletal: Lumbar vertebrae appear stable since November. Medial left iliac sclerotic bone lesions have not significantly changed. Sacrum and SI joints appear intact. Pelvis and proximal femurs appear to remain intact. Review of the MIP images confirms the above findings. IMPRESSION: 1. Negative for Aortic Dissection. Stable mild fusiform aneurysmal enlargement of the ascending aorta. Severe Aortic Atherosclerosis (ICD10-I70.0). 2. Positive for a subacute and pathologic appearing T12 compression fracture, with underlying lytic lesion and loss of height progressed since November. No retropulsion or other complicating features. In conjunction with November CT findings of skeletal metastatic disease, abnormal bladder base and prostate as detailed on that exam. 3. No other acute finding in the Chest, Abdomen, or Pelvis. Cholelithiasis. Electronically Signed   By: VEAR Hurst M.D.   On: 01/18/2023 10:37

## 2023-01-24 DIAGNOSIS — S22080A Wedge compression fracture of T11-T12 vertebra, initial encounter for closed fracture: Secondary | ICD-10-CM | POA: Diagnosis not present

## 2023-01-24 DIAGNOSIS — Z515 Encounter for palliative care: Secondary | ICD-10-CM

## 2023-01-24 DIAGNOSIS — G250 Essential tremor: Secondary | ICD-10-CM

## 2023-01-24 DIAGNOSIS — M5134 Other intervertebral disc degeneration, thoracic region: Secondary | ICD-10-CM | POA: Diagnosis not present

## 2023-01-24 DIAGNOSIS — N179 Acute kidney failure, unspecified: Secondary | ICD-10-CM | POA: Diagnosis not present

## 2023-01-24 DIAGNOSIS — C61 Malignant neoplasm of prostate: Secondary | ICD-10-CM | POA: Diagnosis not present

## 2023-01-24 LAB — CBC
HCT: 39.3 % (ref 39.0–52.0)
Hemoglobin: 12.4 g/dL — ABNORMAL LOW (ref 13.0–17.0)
MCH: 28.6 pg (ref 26.0–34.0)
MCHC: 31.6 g/dL (ref 30.0–36.0)
MCV: 90.6 fL (ref 80.0–100.0)
Platelets: 302 10*3/uL (ref 150–400)
RBC: 4.34 MIL/uL (ref 4.22–5.81)
RDW: 13.1 % (ref 11.5–15.5)
WBC: 6.5 10*3/uL (ref 4.0–10.5)
nRBC: 0 % (ref 0.0–0.2)

## 2023-01-24 LAB — MAGNESIUM: Magnesium: 1.8 mg/dL (ref 1.7–2.4)

## 2023-01-24 LAB — RENAL FUNCTION PANEL
Albumin: 2.8 g/dL — ABNORMAL LOW (ref 3.5–5.0)
Anion gap: 9 (ref 5–15)
BUN: 34 mg/dL — ABNORMAL HIGH (ref 8–23)
CO2: 22 mmol/L (ref 22–32)
Calcium: 9.5 mg/dL (ref 8.9–10.3)
Chloride: 109 mmol/L (ref 98–111)
Creatinine, Ser: 1.08 mg/dL (ref 0.61–1.24)
GFR, Estimated: >60 mL/min (ref >60)
Glucose, Bld: 107 mg/dL — ABNORMAL HIGH (ref 70–99)
Phosphorus: 2.6 mg/dL (ref 2.5–4.6)
Potassium: 3.9 mmol/L (ref 3.5–5.1)
Sodium: 140 mmol/L (ref 135–145)

## 2023-01-24 LAB — GLUCOSE, CAPILLARY
Glucose-Capillary: 113 mg/dL — ABNORMAL HIGH (ref 70–99)
Glucose-Capillary: 115 mg/dL — ABNORMAL HIGH (ref 70–99)
Glucose-Capillary: 138 mg/dL — ABNORMAL HIGH (ref 70–99)
Glucose-Capillary: 105 mg/dL — ABNORMAL HIGH (ref 70–99)

## 2023-01-24 MED ORDER — BOOST / RESOURCE BREEZE PO LIQD CUSTOM
1.0000 | Freq: Three times a day (TID) | ORAL | Status: DC
  Administered 2023-01-24 – 2023-01-26 (×5): 1 via ORAL

## 2023-01-24 MED ORDER — ADULT MULTIVITAMIN W/MINERALS CH
1.0000 | ORAL_TABLET | Freq: Every day | ORAL | Status: DC
  Administered 2023-01-24 – 2023-01-28 (×5): 1 via ORAL
  Filled 2023-01-24 (×5): qty 1

## 2023-01-24 NOTE — Progress Notes (Signed)
    Subjective:    Patient reports pain as 3 on 0-10 scale and mild as a relates to his lumbar spine. Denies CP or SOB.  Voiding without difficulty. Positive flatus.  Objective: Vital signs in last 24 hours: Temp:  [98.7 F (37.1 C)-98.8 F (37.1 C)] 98.7 F (37.1 C) (01/03 0425) Pulse Rate:  [62-69] 63 (01/03 0425) Resp:  [16] 16 (01/03 0425) BP: (146-160)/(53-70) 146/70 (01/03 0425) SpO2:  [95 %-96 %] 95 % (01/03 0425)  Intake/Output from previous day: 01/02 0701 - 01/03 0700 In: 240 [P.O.:240] Out: 450 [Urine:450] Intake/Output this shift: No intake/output data recorded.  Labs: Recent Labs    01/22/23 0342 01/23/23 0356 01/24/23 0345  HGB 10.9* 11.5* 12.4*   Recent Labs    01/23/23 0356 01/24/23 0345  WBC 7.3 6.5  RBC 3.97* 4.34  HCT 35.6* 39.3  PLT 270 302   Recent Labs    01/23/23 0356 01/24/23 0345  NA 136 140  K 3.7 3.9  CL 107 109  CO2 20* 22  BUN 42* 34*  CREATININE 1.28* 1.08  GLUCOSE 99 107*  CALCIUM  9.0 9.5   No results for input(s): LABPT, INR in the last 72 hours.  Physical Exam: Neurologically intact Intact pulses distally Dorsiflexion/Plantar flexion intact No cellulitis present Compartment soft Body mass index is 23.62 kg/m.   Assessment/Plan:  The patient's initial significant back pain has significantly improved since admission.  On exam he is neurologically intact and complains only of lower lumbar pain.  At this point time I do not see the need for any formal surgical intervention as a relates to his lumbar spine.  1.  After reviewing the physical therapy note from yesterday it is apparent that even though the patient's pain is improving he still has significant deconditioning and requires assistance for mobilization.  Recommend we consider [facility] inpatient rehab consultation.  Patient may benefit from more intensive physiotherapy for conditioning.  Other options would be outpatient aquatic physical therapy at the [facility]  [facility]bridge location. 2.  From the orthopedic spine standpoint surgical intervention is not required.  I will continue to monitor patient's progress but if any specific questions or concerns arise please not hesitate contact me.  If the patient is discharged to home recommend he follow back up with my partner Dr. Laqueta at [facility] 7 to 10 days after discharge.  Donaciano JONETTA Sprang for Dr. Donaciano Sprang [facility] [phone] 01/24/2023, 9:51 AM

## 2023-01-24 NOTE — Progress Notes (Signed)
 Nutrition Follow-up  DOCUMENTATION CODES:   Severe malnutrition in context of chronic illness  INTERVENTION:  - Regular diet.  - Boost Breeze po TID, each supplement provides 250 kcal and 9 grams of protein - Encourage intake at all meals and of supplements.  - Add Multivitamin with minerals daily - Monitor weight trends.   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  GOAL:   Patient will meet greater than or equal to 90% of their needs  MONITOR:   PO intake, Supplement acceptance, Weight trends  REASON FOR ASSESSMENT:   Malnutrition Screening Tool    ASSESSMENT:   89 year old male with PMH CAD/CABG, CKD-3A, CVA, HTN, HLD, bladder cancer and recent diagnosis of vertebral metastasis who presented with progressive back pain for about 2 months. Admitted for AKI and multiple metastatic deposits throughout the thoracic spine.   Patient endorses a UBW of 195# and weight loss when his pain began which he feels was around December. Per EMR, patient weighed at 200# in August and dropped to 184# by the beginning of this admission. This is a 16# or 8% weight loss in 4 months or less, which is significant for the time frame.   Endorses eating fairly well PTA. Usually eating 3 small meals a day although notes his appetite has been decreased recently.    Current appetite is poor and patient states he isn't hungry at all. Thankfully, shares he continues to eat a little at all his meals. He is documented to be consuming 50-100% of meals.   States that Ensure gives him diarrhea but agreeable to try Boost Breeze instead.    Medications reviewed and include: 1000 units vitamin D , Miralax   Labs reviewed:  HA1C 6.5 Blood Glucose 97-136 x24 hours   NUTRITION - FOCUSED PHYSICAL EXAM:  Flowsheet Row Most Recent  Value  Orbital Region Severe depletion  Upper Arm Region Moderate depletion  Thoracic and Lumbar Region Moderate depletion  Buccal Region Severe depletion  Temple Region Severe depletion  Clavicle Bone Region Moderate depletion  Clavicle and Acromion Bone Region Severe depletion  Scapular Bone Region Unable to assess  Dorsal Hand Moderate depletion  Patellar Region Moderate depletion  Anterior Thigh Region Moderate depletion  Posterior Calf Region Moderate depletion  Edema (RD Assessment) None  Hair Reviewed  Eyes Reviewed  Mouth Reviewed  Skin Reviewed  Nails Reviewed       Diet Order:   Diet Order             Diet general           Diet regular Fluid consistency: Thin  Diet effective now                   EDUCATION NEEDS:  Education needs have been addressed  Skin:  Skin Assessment: Reviewed RN Assessment  Last BM:  1/3 - type 5  Height:  Ht Readings from Last 1 Encounters:  01/18/23 6' 2 (1.88 m)   Weight:  Wt Readings from Last 1 Encounters:  01/18/23 83.5 kg    BMI:  Body mass index is 23.62 kg/m.  Estimated Nutritional Needs:  Kcal:  2100-2250 kcals Protein:  100-115 grams Fluid:  >/= 2.1L    Trude Ned RD, LDN Contact via Secure Chat.

## 2023-01-24 NOTE — Progress Notes (Signed)
 [facility]                                                                              William Faulkner, is a 89 y.o. male, DOB - 1934-07-13, FMW:[id number] Admit date - 01/18/2023    Outpatient Primary MD for the patient is Amon Aloysius BRAVO, MD  LOS - 4  days  Chief Complaint  Patient presents with   Back Pain   Nausea   Weakness       Brief summary   Patient is a 89 year old male with CAD/CABG, thoracic cord aneurysm, aortic insufficiency, CKD-3A, CVA, HTN, HLD, bladder cancer and recent diagnosis of vertebral metastasis for which she is followed by orthopedic surgery, presenting with progressive back pain for about 2 months.  No report of trauma or fall.  In ED, CT angio chest, abdomen and pelvis raised concern for subacute and pathologic T12 compression fracture without retropulsion or other complicating features, underlying lytic lesion and progressive loss of height, abnormal bladder base and prostate .  [facility], Dr. Burnetta consulted by EDP.  MRI thoracic spine obtained and showed multiple metastatic deposits throughout the thoracic spine, central disc protrusion at T8-9 without canal or foraminal stenosis, rightward disc protrusion and annular tear at T11-12 without focal stenosis but no pathologic fractures.  Oncology, urology and palliative consulted per recommendation by Ortho.  Patient underwent IR bone biopsy on 12/31.  Started on Casodex  for presumed prostate cancer.   Patient with AKI likely due to poor p.o. intake, ACE inhibitors and thiazide.  AKI resolved with fluid and holding nephrotoxic meds.     Assessment & Plan    Principal Problem:  Acute on chronic back pain: - CTA with subacute and pathologic appearing T12 compression fracture with underlying lytic lesion and loss of height progressed.  -MRI T-spine however showed no pathology, fractures, multiple metastatic deposits throughout the thoracic spine, central disc protrusion at T8-9 and  rightward disc fusion at T11-12 without focal stenosis.   -No acute focal neurosymptoms, pain improving.   -S/p IR bone biopsy on 12/31.  Pathology pending. -Started on Casodex  for presumed prostate cancer by oncology. -Continue scheduled Tylenol  with as needed oxycodone  and Dilaudid  for pain control -Continue decreased dose of gabapentin . 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  No pathologic fracture per MRI.  Unknown primary.  -  Patient has history of bladder cancer.  CT shows abnormal bladder base and prostate. -Urology, oncology consulted, s/p IR bone biopsy.    Acute kidney injury on  CKD-3A:  Likely due to poor p.o. intake, ACE inhibitor's and HCTZ.  Baseline creatinine has been 1.3-1.5 - Renal US  without acute finding but enlarged prostate.  AKI improved with IV fluid. -Creatinine plateaued at 2.44 on 12/30, now improving, 1.0 -Continue holding ACE, HCTZ  Acute metabolic encephalopathy: - Likely due to dehydration and gabapentin  -Mental status improving, no focal    DM-2 with hyperglycemia, neuropathy, hyperlipidemia and CKD-3A: -  A1c 6.5% on 8/28. -Continue sliding scale insulin  CBG (last 3)  Recent Labs    01/23/23 2128 01/24/23 0730 01/24/23 1210  GLUCAP 127* 113* 115*     History of CAD/CABG:  -No acute issues, continue aspirin , propranolol     Essential hypertension/sinus bradycardia:  -Bradycardia resolved.  -Continue holding Lotensin , hydralazine  and HCTZ -Continue propranolol    Benign essential tremor -Continue home propranolol .   Glaucoma -Continue timolol  drops. -Follow-up with ophthalmology as an outpatient.   Physical deconditioning -PT recommended SNF  Estimated body mass index is 23.62 kg/m as calculated from the following:   Height as of this encounter: 6' 2 (1.88 m).   Weight as of this  encounter: 83.5 kg.  Code Status: Full code DVT Prophylaxis:  enoxaparin  (LOVENOX ) injection 40 mg Start: 01/22/23 1200   Level of Care: Level of care: Med-Surg Family Communication: Updated patient Disposition Plan:      Remains inpatient appropriate: Awaiting SNF   Procedures:    Consultants:   Oncology Orthopedics  Antimicrobials:   Anti-infectives (From admission, onward)    None          Medications  acetaminophen   1,000 mg Oral Q8H   aspirin  EC  81 mg Oral Daily   bicalutamide   50 mg Oral Daily   cholecalciferol   1,000 Units Oral Daily   enoxaparin  (LOVENOX ) injection  40 mg Subcutaneous Q24H   feeding supplement  1 Container Oral TID BM   feeding supplement  237 mL Oral BID BM   gabapentin   300 mg Oral BID   guaiFENesin   600 mg Oral BID   insulin  aspart  0-9 Units Subcutaneous TID WC   lidocaine   1 patch Transdermal Q24H   nystatin    Topical TID   polyethylene glycol  17 g Oral Daily   propranolol   40 mg Oral Daily   rosuvastatin   20 mg Oral Daily   timolol   1 drop Both Eyes q morning      Subjective:   William Faulkner was seen and examined today.  No acute complaints, just feeling weak.  And is controlled 3 out of 10 in the back.  Patient denies dizziness, chest pain, shortness of breath, abdominal pain, N/V/D/C. No acute events overnight.    Objective:   Vitals:   01/23/23 0830 01/23/23 1904 01/23/23 2131 01/24/23 0425  BP:  (!) 149/53 (!) 160/66 (!) 146/70  Pulse: 65 69 62 63  Resp:  16 16 16   Temp:  98.7 F (37.1 C) 98.8 F (37.1 C) 98.7 F (37.1 C)  TempSrc:  Oral Oral Oral  SpO2:  96% 95% 95%  Weight:      Height:        Intake/Output Summary (Last 24 hours) at 01/24/2023 1300 Last data filed at 01/24/2023 9372 Gross per 24 hour  Intake 120 ml  Output 350 ml  Net -230 ml     Wt Readings from Last 3 Encounters:  01/18/23 83.5 kg  09/18/22 90.8 kg  01/08/22 92.5 kg     Exam General: Alert and oriented x 3,  NAD Cardiovascular: S1 S2 auscultated,  RRR Respiratory: Clear to auscultation bilaterally, no wheezing Gastrointestinal: Soft, nontender, nondistended, + bowel sounds Ext: no pedal edema bilaterally Neuro: Strength 5/5 upper and lower extremities bilaterally Psych: Normal affect     Data Reviewed:  I have personally reviewed following labs    CBC Lab Results  Component Value Date   WBC 6.5 01/24/2023   RBC 4.34 01/24/2023   HGB 12.4 (L) 01/24/2023   HCT 39.3 01/24/2023   MCV 90.6 01/24/2023  MCH 28.6 01/24/2023   PLT 302 01/24/2023   MCHC 31.6 01/24/2023   RDW 13.1 01/24/2023   LYMPHSABS 1.1 01/18/2023   MONOABS 0.5 01/18/2023   EOSABS 0.1 01/18/2023   BASOSABS 0.0 01/18/2023     Last metabolic panel Lab Results  Component Value Date   NA 140 01/24/2023   K 3.9 01/24/2023   CL 109 01/24/2023   CO2 22 01/24/2023   BUN 34 (H) 01/24/2023   CREATININE 1.08 01/24/2023   GLUCOSE 107 (H) 01/24/2023   GFRNONAA >60 01/24/2023   GFRAA 58 (L) 12/01/2019   CALCIUM  9.5 01/24/2023   PHOS 2.6 01/24/2023   PROT 7.6 01/18/2023   ALBUMIN 2.8 (L) 01/24/2023   LABGLOB 2.6 01/20/2023   AGRATIO 1.0 01/20/2023   BILITOT 0.9 01/18/2023   ALKPHOS 85 01/18/2023   AST 22 01/18/2023   ALT 16 01/18/2023   ANIONGAP 9 01/24/2023    CBG (last 3)  Recent Labs    01/23/23 2128 01/24/23 0730 01/24/23 1210  GLUCAP 127* 113* 115*      Coagulation Profile: Recent Labs  Lab 01/20/23 1925  INR 1.1     Radiology Studies: I have personally reviewed the imaging studies  DG Chest 2 View Result Date: 01/23/2023 CLINICAL DATA:  Follow-up pneumonia EXAM: CHEST - 2 VIEW COMPARISON:  01/18/2023 CT FINDINGS: Cardiac shadow is enlarged. Postsurgical changes and aortic calcifications are again noted. Lungs are well aerated bilaterally. Minimal left basilar atelectasis is seen. No bony abnormality is noted. IMPRESSION: Minimal left basilar atelectasis. Electronically Signed   By: Oneil Devonshire M.D.   On: 01/23/2023 19:19       Eragon Hammond M.D. [facility] 01/24/2023, 1:00 PM  Available via Epic secure chat 7am-7pm After 7 pm, please refer to night coverage provider listed on amion.

## 2023-01-24 NOTE — Progress Notes (Signed)
 William Faulkner   DOB:[birth year]   FM#:[id number]    ASSESSMENT & PLAN:  89 year old male with history of diabetes, CAD/CABG, hypertension, glaucoma presented with back pain found to have diffuse bony lesions.  We discussed the results in the setting of bone lesions with elevated PSA of 261. Biopsy was on done and he started bicalutamide  in the meantime.   Spoke to path.  Results will be available next Monday or Tuesday.  Patient has improved clinically today.  From oncology perspective, patient can be discharged.  He has no side effects from bicalutamide .  continue bicalutamide  50 mg daily on discharge Pain control per primary.  Discussed using half tablet or oxycodone  if needed as he was out of it from taking the whole tab. Follow-up with pathology in clinic next week Vit D 1000 international units  and calcium  1200 mg daily Counseled on increase fluid intake   Thank you for the consult. Will sign off. Our scheduler will call his wife on appointment for next week.  All questions were answered.   01/24/2023 3:11 PM  Subjective:  William Faulkner reports feeling the same. Coughing improved. Report working better with PT today. Up in the chair and walking. No hot flashes.  Objective:  Vitals:   01/24/23 0425 01/24/23 1417  BP: (!) 146/70 (!) 137/52  Pulse: 63 61  Resp: 16 20  Temp: 98.7 F (37.1 C) 98.6 F (37 C)  SpO2: 95% 95%     Intake/Output Summary (Last 24 hours) at 01/24/2023 1511 Last data filed at 01/24/2023 1423 Gross per 24 hour  Intake 360 ml  Output 350 ml  Net 10 ml    GENERAL: alert, no distress and comfortable SKIN: skin color normal    Labs:  Recent Labs    01/18/23 0822 01/20/23 0321 01/22/23 0342 01/23/23 0356 01/24/23 0345  NA 139   < > 136 136 140  K 4.3   < > 3.6 3.7 3.9  CL 105   < > 107 107 109  CO2 21*   < > 22 20* 22  GLUCOSE 128*   < > 113* 99 107*  BUN 38*   < > 55* 42* 34*  CREATININE 1.47*   < > 1.56* 1.28* 1.08  CALCIUM  10.0   < > 8.5* 9.0  9.5  GFRNONAA 46*   < > 43* 55* >60  PROT 7.6  --   --   --   --   ALBUMIN 3.6   < > 2.5* 2.7* 2.8*  AST 22  --   --   --   --   ALT 16  --   --   --   --   ALKPHOS 85  --   --   --   --   BILITOT 0.9  --   --   --   --    < > = values in this interval not displayed.    Studies:  DG Chest 2 View Result Date: 01/23/2023 CLINICAL DATA:  Follow-up pneumonia EXAM: CHEST - 2 VIEW COMPARISON:  01/18/2023 CT FINDINGS: Cardiac shadow is enlarged. Postsurgical changes and aortic calcifications are again noted. Lungs are well aerated bilaterally. Minimal left basilar atelectasis is seen. No bony abnormality is noted. IMPRESSION: Minimal left basilar atelectasis. Electronically Signed   By: Oneil Devonshire M.D.   On: 01/23/2023 19:19   IR Fluoro Guide Ndl Plmt / BX Result Date: 01/21/2023 INDICATION: Pathologic fracture. EXAM: FLUOROSCOPY GUIDED T12 CORE BONE BIOPSY MEDICATIONS: None. ANESTHESIA/SEDATION:  Moderate (conscious) sedation was employed during this procedure. A total of Versed  1.5 mg and Fentanyl  75 mcg was administered intravenously by the radiology nurse. Total intra-service moderate Sedation Time: 29 minutes. The patient's level of consciousness and vital signs were monitored continuously by radiology nursing throughout the procedure under my direct supervision. Fluoroscopy does: 657 mGy air Kerma. COMPLICATIONS: None immediate. PROCEDURE: Informed written consent was obtained from the patient after a thorough discussion of the procedural risks, benefits and alternatives. All questions were addressed. Maximal Sterile Barrier Technique was utilized including caps, mask, sterile gowns, sterile gloves, sterile drape, hand hygiene and skin antiseptic. A timeout was performed prior to the initiation of the procedure. The patient was placed in prone position on the angiography table. The thoracic spine region was prepped and draped in a sterile fashion. Under fluoroscopy, the T12 vertebral body was  delineated and the skin area was marked. The skin was infiltrated with a 1% Lidocaine  approximately 3 cm lateral to the spinous process projection on the right. Using a 22-gauge spinal needle, the soft issue and the peripedicular space and periosteum were infiltrated with Bupivacaine  0.5%. A skin incision was made at the access site. Subsequently, an 11-gauge Kyphon trocar was inserted under fluoroscopic guidance until contact with the pedicle was obtained. The trocar was inserted under light hammer tapping into the pedicle until the posterior boundaries of the vertebral body was reached. The diamond mandrill was removed and 3 core bone biopsy samples were obtained. The trocar was later removed. The access site was cleaned and covered with a sterile bandage. IMPRESSION: Fluoroscopy guided T12 core bone biopsy performed using tiny fragments on each core biopsy pass. Samples obtained were sent for tissue exam. Electronically Signed   By: [name] M.D.   On: 01/21/2023 12:46   DG Bone Survey Met Result Date: 01/20/2023 CLINICAL DATA:  Lytic bone lesions on x-ray. Multiple metastatic deposits throughout the thoracic spine. EXAM: METASTATIC BONE SURVEY COMPARISON:  Thoracic spine MRI 01/18/2023. Chest abdomen pelvis CT 01/18/2023. FINDINGS: Patient with known lesions in the thoracic and lumbar spine. This was better assessed on recent CT and MRI. The majority of these lesions are not well-defined by radiograph. The known T12 compression fracture is not well assessed on the current exam due to osseous overlap. The known left iliac sclerotic lesion is not well seen by radiograph. Area of sclerosis projects over the lateral aspect of the right distal tibia at the ankle joint. Other there are extremity lesion is seen by radiograph. Prior median sternotomy. Blunting of the left costophrenic angle, likely epicardial fat pad and atelectasis. Aortic atherosclerosis and vascular calcifications.  IMPRESSION: 1. Patient with known spinal osseous bone lesions. This was better assessed on recent CT and MRI. The majority of these lesions are not well-defined by radiograph. 2. The known T12 compression fracture is not well assessed on the current exam due to osseous overlap. 3. The known left iliac sclerotic lesion is not well seen by radiograph. 4. Area of sclerosis projects over the lateral aspect of the right distal tibia at the ankle joint, indeterminate. Electronically Signed   By: Andrea Gasman M.D.   On: 01/20/2023 22:50   US  RENAL Result Date: 01/20/2023 CLINICAL DATA:  Acute kidney injury EXAM: RENAL / URINARY TRACT ULTRASOUND COMPLETE COMPARISON:  CT 01/18/2023 FINDINGS: Right Kidney: Renal measurements: 9.1 x 4.7 x 4.4 cm = volume: 9.5 mL. Echogenicity within normal limits. No mass or hydronephrosis visualized. Left Kidney: Renal measurements: 11.9 x  5.3 x 5 cm = volume: 165 mL. Echogenicity within normal limits. No mass or hydronephrosis visualized. Bladder: Appears normal for degree of bladder distention. Other: Prostate enlargement at least 5.7 x 5.3 cm. IMPRESSION: 1. Negative for hydronephrosis. 2. Prostate enlargement. Electronically Signed   By: JONETTA Faes M.D.   On: 01/20/2023 16:00   MR THORACIC SPINE WO CONTRAST Result Date: 01/18/2023 CLINICAL DATA:  Bladder cancer. Evaluate for metastatic disease to the thoracic spine. Chronic back pain. Weakness beginning approximately 1 month ago. EXAM: MRI THORACIC SPINE WITHOUT CONTRAST TECHNIQUE: Multiplanar, multisequence MR imaging of the thoracic spine was performed. No intravenous contrast was administered. COMPARISON:  CT angio chest, abdomen and pelvis 01/18/2023. FINDINGS: Alignment: No significant listhesis is present. Mild rightward curvature is centered at T8-9. Thoracic kyphosis is intact. Vertebrae: Multiple metastatic deposits are present throughout the thoracic spine. A 6 mm lesion is present within the right pedicle of T1. An  8 mm lesion is present posterior elements of T2. Anterior lesion at T2 measures 13 mm. Metastatic lesions are present within the T3 vertebral body and the spinous process of T3. A right superior lesion at T5 extends to the pedicle. Rounded metastases at T7 measure 10 and 11 mm. Larger metastases at T8 extend from the superior to inferior endplate, 18 mm. Similar larger lesions are present throughout the remainder of the lower thoracic spine. A right T11 lesion extends into the right pedicle. Diffuse metastatic infiltration is present in the T12 vertebral body. No pathologic fractures are present. Extensive infiltration of the L1 vertebral body is present. Cord:  Normal signal and morphology. Paraspinal and other soft tissues: See CT report from the same day for further description of metastatic disease outside the spine. Disc levels: A central disc protrusion at T8-9 effaces the ventral CSF. The foramina are patent bilaterally. No significant disc disease or stenosis is present above this level. T9-10: Negative. T10-11: Negative. T11-12: A rightward disc protrusion and annular tear is present without focal stenosis. T12-L1: Negative. IMPRESSION: 1. Multiple metastatic deposits throughout the thoracic spine as described. 2. No pathologic fractures. 3. Central disc protrusion at T8-9 effaces the ventral CSF. 4. Rightward disc protrusion and annular tear at T11-12 without focal stenosis. Electronically Signed   By: Lonni Necessary M.D.   On: 01/18/2023 19:20   CT Angio Chest/Abd/Pel for Dissection W and/or Wo Contrast Result Date: 01/18/2023 CLINICAL DATA:  89 year old male with pain, weakness. Known mild aneurysmal dilatation of the ascending thoracic aorta. Bladder cancer. EXAM: CT ANGIOGRAPHY CHEST, ABDOMEN AND PELVIS TECHNIQUE: Non-contrast CT of the chest was initially obtained. Multidetector CT imaging through the chest, abdomen and pelvis was performed using the standard protocol during bolus  administration of intravenous contrast. Multiplanar reconstructed images and MIPs were obtained and reviewed to evaluate the vascular anatomy. RADIATION DOSE REDUCTION: This exam was performed according to the departmental dose-optimization program which includes automated exposure control, adjustment of the mA and/or kV according to patient size and/or use of iterative reconstruction technique. CONTRAST:  75mL OMNIPAQUE  IOHEXOL  350 MG/ML SOLN COMPARISON:  CTA chest 08/02/2022 and earlier. Restaging CT Abdomen and Pelvis 12/11/2022 FINDINGS: CTA CHEST FINDINGS Cardiovascular: Previous CABG. Extensive Calcified aortic atherosclerosis. Tortuous ascending aorta is stable, smoothly tapers in the arch. Negative for thoracic aortic dissection, periaortic hematoma. Heart size is stable, within normal limits. No pericardial effusion. Little contrast in the pulmonary arteries. Proximal great vessel atherosclerosis appears stable since July. Mediastinum/Nodes: Negative for mediastinal hematoma, mass, lymphadenopathy. Lungs/Pleura: Stable lung volumes since July.  Patchy lung base atelectasis or scarring has not significantly changed. No pleural effusion. Small volume new retained secretions in the trachea just below the thoracic inlet on series 4 image 30. But elsewhere the major airways are patent. No active pulmonary inflammation is identified. Musculoskeletal: Chronic sternotomy. Chronic right 2nd rib deformity. T1 through T11 thoracic vertebrae appear stable since July. T12 compression fracture with mild comminution is new since July but was present, subtle in November when other skeletal metastatic disease was suspected. Central T12 loss of vertebral body height up to 30% (series 6, image 105). Lucency within the T12 body appears progressed since July. No retropulsion. T12 pedicles and posterior elements appear intact and aligned. There is mild anterior and lateral prevertebral soft tissue or edema which is increased  since November on series 11, image 56. No epidural tumor by CT. Review of the MIP images confirms the above findings. CTA ABDOMEN AND PELVIS FINDINGS VASCULAR Aortoiliac calcified atherosclerosis. Extensive aortic and branch calcified atherosclerosis but the major arterial structures in the abdomen and pelvis remain patent. Negative for abdominal aortic dissection or aneurysm. Review of the MIP images confirms the above findings. NON-VASCULAR Hepatobiliary: Stable liver. Cholelithiasis. No gallbladder inflammation by CT. Pancreas: Diminutive. Spleen: Diminutive. Adrenals/Urinary Tract: Normal adrenal glands. Nonobstructed kidneys with symmetric renal enhancement. No hydroureter. Abnormal prostate at the base of the bladder as below. Diminutive bladder similar to the November CT. Stomach/Bowel: Decompressed large bowel from the splenic flexure distally but transverse colon is redundant with moderate retained gas and stool, increased since November. Low-density retained stool in the right colon. Cecum is on a lax mesentery. No dilated small bowel. No large bowel inflammation identified. Stomach and duodenum appear negative. No free air or free fluid. Lymphatic: No lymphadenopathy identified in the abdomen or pelvis. Reproductive: Abnormally heterogeneous, lobulated, masslike appearance of the prostate on series 10, image 203 not significantly changed from the restaging CT last month (please see that report) Other: No pelvis free fluid. Musculoskeletal: Lumbar vertebrae appear stable since November. Medial left iliac sclerotic bone lesions have not significantly changed. Sacrum and SI joints appear intact. Pelvis and proximal femurs appear to remain intact. Review of the MIP images confirms the above findings. IMPRESSION: 1. Negative for Aortic Dissection. Stable mild fusiform aneurysmal enlargement of the ascending aorta. Severe Aortic Atherosclerosis (ICD10-I70.0). 2. Positive for a subacute and pathologic appearing  T12 compression fracture, with underlying lytic lesion and loss of height progressed since November. No retropulsion or other complicating features. In conjunction with November CT findings of skeletal metastatic disease, abnormal bladder base and prostate as detailed on that exam. 3. No other acute finding in the Chest, Abdomen, or Pelvis. Cholelithiasis. Electronically Signed   By: VEAR Hurst M.D.   On: 01/18/2023 10:37

## 2023-01-24 NOTE — [place] FL2 (Signed)
 [place]  MEDICAID FL2 LEVEL OF CARE FORM     IDENTIFICATION  Patient Name: William Faulkner Birthdate: 10/09/1934 Sex: male Admission Date (Current Location): 01/18/2023  [facility] and [place] Number:  Producer, Television/film/video and Address:  [facility],  [address] [place], [place] [address]      Provider Number: [id number]  Attending Physician Name and Address:  Davia Nydia POUR, MD  Relative Name and Phone Number:  Yoni Lobos (spouse) Ph: [phone]    Current Level of Care: Hospital Recommended Level of Care: Skilled Nursing Facility Prior Approval Number:    Date Approved/Denied:   PASRR Number: [id number]  Discharge Plan: SNF    Current Diagnoses: Patient Active Problem List   Diagnosis Date Noted   Prostate cancer (HCC) 01/21/2023   AKI (acute kidney injury) (HCC) 01/20/2023   Bulge of thoracic disc without myelopathy 01/19/2023   Elevated PSA 01/19/2023   Compression fracture of T12 vertebra (HCC) 01/18/2023   Metastasis to bone (HCC) 01/18/2023   RBBB 12/21/2019   CRI (chronic renal insufficiency), stage 3 (moderate) (HCC) 12/21/2019   Chronic back pain 11/21/2016   Neuropathy 11/21/2016   Benign essential tremor 07/11/2016   Follow-up -------------PCP NOTES 09/28/2014   Dizziness 08/10/2014   Cholelithiasis 03/24/2014   Annual physical exam 11/01/2013   Tremor 03/03/2013   Cervical spondylosis 12/17/2012   Inguinal hernia unilateral, non-recurrent, right 08/26/2011   Aortic aneurysm (HCC) 01/03/2010   Hx of CABG 06/20/2009   ANGINA DECUBITUS 06/02/2009   Cerebrovascular disease 06/02/2009   Diabetes and neuropathy 03/14/2008   NEOPLASM, MALIGNANT, BLADDER, HX OF 12/21/2007   BPH (benign prostatic hyperplasia) 01/12/2007   Hyperlipidemia 12/17/2006   Essential hypertension 12/17/2006   Aortic valve disorder 08/18/2006    Orientation RESPIRATION BLADDER Height & Weight     Self, Time, Situation, Place  Normal Incontinent Weight:  184 lb (83.5 kg) Height:  6' 2 (188 cm)  BEHAVIORAL SYMPTOMS/MOOD NEUROLOGICAL BOWEL NUTRITION STATUS      Incontinent Diet (Regular diet)  AMBULATORY STATUS COMMUNICATION OF NEEDS Skin   Extensive Assist Verbally Bruising, Other (Comment) (Bruising: scattered; Rash: buttocks, groin)                       Personal Care Assistance Level of Assistance  Bathing, Feeding, Dressing Bathing Assistance: Limited assistance Feeding assistance: Independent Dressing Assistance: Limited assistance     Functional Limitations Info  Sight, Hearing, Speech Sight Info: Adequate Hearing Info: Adequate Speech Info: Adequate    SPECIAL CARE FACTORS FREQUENCY  PT (By licensed PT), OT (By licensed OT)     PT Frequency: 5x's/week OT Frequency: 5x's/week            Contractures Contractures Info: Not present    Additional Factors Info  Code Status, Allergies, Insulin  Sliding Scale Code Status Info: Full Allergies Info: Atorvastatin, Celecoxib, Sulfonamide Derivatives, Zolpidem Tartrate   Insulin  Sliding Scale Info: See discharge summary       Current Medications (01/24/2023):  This is the current hospital active medication list Current Facility-Administered Medications  Medication Dose Route Frequency Provider Last Rate Last Admin   acetaminophen  (TYLENOL ) tablet 1,000 mg  1,000 mg Oral Q8H Gonfa, Taye T, MD   1,000 mg at 01/24/23 9371   aspirin  EC tablet 81 mg  81 mg Oral Daily Celinda Alm Lot, MD   81 mg at 01/24/23 1026   bicalutamide  (CASODEX ) tablet 50 mg  50 mg Oral Daily Tina Pauletta BROCKS, MD  50 mg at 01/24/23 1026   cholecalciferol  (VITAMIN D3) 25 MCG (1000 UNIT) tablet 1,000 Units  1,000 Units Oral Daily Celinda Alm Lot, MD   1,000 Units at 01/24/23 1026   enoxaparin  (LOVENOX ) injection 40 mg  40 mg Subcutaneous Q24H Gonfa, Taye T, MD   40 mg at 01/23/23 1343   feeding supplement (BOOST / RESOURCE BREEZE) liquid 1 Container  1 Container Oral TID BM Rai, Ripudeep K, MD        feeding supplement (ENSURE ENLIVE / ENSURE PLUS) liquid 237 mL  237 mL Oral BID BM Gonfa, Taye T, MD   237 mL at 01/24/23 1028   gabapentin  (NEURONTIN ) capsule 300 mg  300 mg Oral BID Gonfa, Taye T, MD   300 mg at 01/24/23 1026   guaiFENesin  (MUCINEX ) 12 hr tablet 600 mg  600 mg Oral BID Gonfa, Taye T, MD   600 mg at 01/24/23 1026   HYDROmorphone  (DILAUDID ) injection 0.5 mg  0.5 mg Intravenous Q4H PRN Gonfa, Taye T, MD       insulin  aspart (novoLOG ) injection 0-9 Units  0-9 Units Subcutaneous TID WC Gonfa, Taye T, MD   1 Units at 01/23/23 1348   lidocaine  (LIDODERM ) 5 % 1 patch  1 patch Transdermal Q24H Chavez, Abigail, NP   1 patch at 01/23/23 2135   naloxone  (NARCAN ) injection 0.4 mg  0.4 mg Intravenous PRN Celinda Alm Lot, MD       nystatin  (MYCOSTATIN /NYSTOP ) topical powder   Topical TID Celinda Alm Lot, MD   Given at 01/24/23 1027   ondansetron  (ZOFRAN ) tablet 4 mg  4 mg Oral Q6H PRN Celinda Alm Lot, MD       Or   ondansetron  (ZOFRAN ) injection 4 mg  4 mg Intravenous Q6H PRN Celinda Alm Lot, MD   4 mg at 01/19/23 9362   oxyCODONE  (Oxy IR/ROXICODONE ) immediate release tablet 5 mg  5 mg Oral Q8H PRN Gonfa, Taye T, MD   5 mg at 01/23/23 1342   polyethylene glycol (MIRALAX  / GLYCOLAX ) packet 17 g  17 g Oral Daily Celinda Alm Lot, MD   17 g at 01/23/23 9167   propranolol  (INDERAL ) tablet 40 mg  40 mg Oral Daily Gonfa, Taye T, MD   40 mg at 01/24/23 1026   rosuvastatin  (CRESTOR ) tablet 20 mg  20 mg Oral Daily Celinda Alm Lot, MD   20 mg at 01/24/23 1026   timolol  (TIMOPTIC ) 0.5 % ophthalmic solution 1 drop  1 drop Both Eyes q morning Celinda Alm Lot, MD   1 drop at 01/24/23 1028     Discharge Medications: Please see discharge summary for a list of discharge medications.  Relevant Imaging Results:  Relevant Lab Results:   Additional Information SSN: [id number]  Duwaine GORMAN Aran, LCSW

## 2023-01-24 NOTE — Progress Notes (Signed)
 Physical Therapy Treatment Patient Details Name: William Faulkner MRN: [id number] DOB: Apr 11, 1934 Today's Date: 01/24/2023   History of Present Illness 89 y.o. male with medical history significant of benign essential tremor, BPH, cholelithiasis, type 2 diabetes, CAD/CABG, aortic aneurysm, arctic insufficiency, hyperlipidemia, hypertension, left rotator cuff tear, diabetic peripheral neuropathy bladder cancer, cerebrovascular disease, stage 3a CKD who presented with history of 2 months of back pain, nausea and weakness. Admitted on 01/18/23 with complaints of increased back pain. CT: Positive for subacute and pathologic appearing T12 compression fracture, with underlying lytic lesion and loss of height progressed since November. MRI: negative (presented with back pain found to have diffuse bony lesions.  We discussed the results in the setting of bone lesions with elevated PSA of 261. Biopsy was on done and he started bicalutamide  in the meantime.)    PT Comments   Pt admitted with above diagnosis.  Pt currently with functional limitations due to the deficits listed below (see PT Problem List). Pt in bed when PT arrived. Pt indicates the pain is better managed today. Pt agreeable to therapy intervention, pt required mod A and use of hospital bed for supine to sit, pt required total A for donning TLSO EOB, PT asked pt if he knew back precautions and pt stated he could not go swimming. Pt ed provided on back precautions and placed on white board, pt will benefit from reinforcement and ed on recall and observation of back precautions. Pt required CGA for sit to stand  from EOB with cues. Pt  demonstrated increased gait tolerance and IND with 80 feet, RW, cues for safety, proper distance from RW and extension. Pt exhibits excessive cervical flexion and limiting visual gaze and scanning for gait tasks. Pt will require 24/7 supervision and assist in next venue and will benefit from from continued inpatient follow  up therapy, <3 hours/day.  Pt will benefit from acute skilled PT to increase their independence and safety with mobility to allow discharge.      If plan is discharge home, recommend the following: Assistance with cooking/housework;Assist for transportation;Help with stairs or ramp for entrance;A little help with walking and/or transfers;A lot of help with bathing/dressing/bathroom   Can travel by private vehicle     No  Equipment Recommendations  Rolling walker (2 wheels)    Recommendations for Other Services       Precautions / Restrictions Precautions Precautions: Back Precaution Booklet Issued: No Precaution Comments: Verbal education provided on BLT back precautions during evaluation. Required Braces or Orthoses: Spinal Brace Spinal Brace: Thoracolumbosacral orthotic;Applied in sitting position Restrictions Weight Bearing Restrictions Per Provider Order: No Other Position/Activity Restrictions: spinal precautions     Mobility  Bed Mobility Overal bed mobility: Needs Assistance Bed Mobility: Supine to Sit     Supine to sit: Mod assist     General bed mobility comments: cues for log roll, assist to elevate trunk, incr time; use of bed rail    Transfers Overall transfer level: Needs assistance Equipment used: Rolling walker (2 wheels) Transfers: Sit to/from Stand Sit to Stand: From elevated surface, Contact guard assist           General transfer comment: cues for UE placement, pt able to demonstrate improved abiltiy with power up from elevated surface, pt maintains trunk flexion with excesive cervial flexion and unable to modify posture in standing or with gait tasks    Ambulation/Gait Ambulation/Gait assistance: Contact guard assist Gait Distance (Feet): 80 Feet Assistive device: Rolling walker (2 wheels) Gait  Pattern/deviations: Decreased step length - left, Decreased dorsiflexion - left, Knee flexed in stance - right, Knee flexed in stance - left, Trunk  flexed, Shuffle Gait velocity: decreased     General Gait Details: cues for posture, positioin from RW and safety, recliner close with pt demonstrating improved gait tolerance and IND today   Stairs             Wheelchair Mobility     Tilt Bed    Modified Rankin (Stroke Patients Only)       Balance Overall balance assessment: Needs assistance Sitting-balance support: Feet supported, Bilateral upper extremity supported Sitting balance-Leahy Scale: Good     Standing balance support: Bilateral upper extremity supported, During functional activity, Reliant on assistive device for balance Standing balance-Leahy Scale: Poor                              Cognition Arousal: Alert Behavior During Therapy: WFL for tasks assessed/performed Overall Cognitive Status: Within Functional Limits for tasks assessed                                          Exercises      General Comments General comments (skin integrity, edema, etc.): total A to don TLSO seated EOB      Pertinent Vitals/Pain Pain Assessment Pain Assessment: 0-10 Pain Score: 4  Pain Location: back Pain Descriptors / Indicators: Aching, Sore, Grimacing Pain Intervention(s): Limited activity within patient's tolerance, Monitored during session, Repositioned    Home Living                          Prior Function            PT Goals (current goals can now be found in the care plan section) Acute Rehab PT Goals Patient Stated Goal: Regain IND and return home PT Goal Formulation: With patient Time For Goal Achievement: 02/02/23 Potential to Achieve Goals: Good Progress towards PT goals: Progressing toward goals    Frequency    Min 1X/week      PT Plan      Co-evaluation              AM-PAC PT 6 Clicks Mobility   Outcome Measure  Help needed turning from your back to your side while in a flat bed without using bedrails?: A Little Help needed  moving from lying on your back to sitting on the side of a flat bed without using bedrails?: A Lot Help needed moving to and from a bed to a chair (including a wheelchair)?: A Little Help needed standing up from a chair using your arms (e.g., wheelchair or bedside chair)?: A Little Help needed to walk in hospital room?: A Little Help needed climbing 3-5 steps with a railing? : Total 6 Click Score: 15    End of Session Equipment Utilized During Treatment: Gait belt Activity Tolerance: Patient limited by fatigue Patient left: in chair;with call bell/phone within reach;with chair alarm set Nurse Communication: Mobility status PT Visit Diagnosis: Unsteadiness on feet (R26.81);Muscle weakness (generalized) (M62.81);Difficulty in walking, not elsewhere classified (R26.2);Pain Pain - part of body:  (back)     Time: 8772-8756 PT Time Calculation (min) (ACUTE ONLY): 16 min  Charges:    $Gait Training: 8-22 mins PT General Charges $$ ACUTE PT VISIT:  1 Visit                     [place], PT Acute Rehab    [place] VEAR Drone 01/24/2023, 2:49 PM

## 2023-01-24 NOTE — Progress Notes (Signed)
  Daily Progress Note   Patient Name: William Faulkner       Date: 01/24/2023 DOB: December 13, 1934  Age: 89 y.o. MRN#: [id number] Attending Physician: Davia Nydia POUR, MD Primary Care Physician: Amon Aloysius BRAVO, MD Admit Date: 01/18/2023 Length of Stay: 4 days  PMT has been following along peripherally with patient's medical journey. Patient had bone biopsy and was started on therapy for management of metastatic prostate cancer. As per EMR review, patient's symptom burden, specifically pain, has improved. Patient now awaiting rehab placement to improve his functional status. Again recommend referral for outpatient follow up with palliative medicine team at [facility] at time of discharge. Should acute PMT needs arise, please reach out to provider. Thank you.    Tinnie Radar, DO Palliative Care Provider PMT # [phone]

## 2023-01-24 NOTE — TOC Progression Note (Addendum)
 Transition of Care [facility]) - Progression Note   Patient Details  Name: William Faulkner MRN: [id number] Date of Birth: Nov 22, 1934  [facility]) CM/SW Contact  Duwaine GORMAN Aran, LCSW Phone Number: 01/24/2023, 11:33 AM  Clinical Narrative: PT recommendation has been updated to SNF. CSW spoke with spouse, William Faulkner, who reported she discussed patient's current level of need with family and they cannot manage it at home and are agreeable to rehab. Wife requested [facility] as first choice. FL2 done; PASRR received. CSW faxed initial referral in hub. CSW reached out to [facility] in admissions at [facility] to have patient reviewed.  Addendum: [facility] notified CSW [facility] can accept the patient on 01/27/23/  Expected Discharge Plan: Skilled Nursing Facility Barriers to Discharge: SNF Pending bed offer, Continued Medical Work up, English As A Second Language Teacher  Expected Discharge Plan and Services In-house Referral: Clinical Social Work Post Acute Care Choice: Home Health Living arrangements for the past 2 months: Single Family Home Expected Discharge Date: 01/23/23               DME Arranged: N/A DME Agency: NA HH Arranged: PT, OT HH Agency: [facility] Date [facility] Agency Contacted: 01/20/23 Time HH Agency Contacted: 1355 Representative spoke with at [facility] Agency: Cindie  Social Determinants of Health (SDOH) Interventions SDOH Screenings   Food Insecurity: No Food Insecurity (01/18/2023)  Housing: Low Risk  (01/18/2023)  Transportation Needs: No Transportation Needs (01/18/2023)  Utilities: Not At Risk (01/18/2023)  Depression (PHQ2-9): Low Risk  (09/18/2022)  Social Connections: Moderately Isolated (01/20/2023)  Tobacco Use: Medium Risk (01/18/2023)   Readmission Risk Interventions     No data to display

## 2023-01-25 DIAGNOSIS — E43 Unspecified severe protein-calorie malnutrition: Secondary | ICD-10-CM | POA: Insufficient documentation

## 2023-01-25 DIAGNOSIS — G250 Essential tremor: Secondary | ICD-10-CM | POA: Diagnosis not present

## 2023-01-25 DIAGNOSIS — S22080A Wedge compression fracture of T11-T12 vertebra, initial encounter for closed fracture: Secondary | ICD-10-CM | POA: Diagnosis not present

## 2023-01-25 DIAGNOSIS — C61 Malignant neoplasm of prostate: Secondary | ICD-10-CM | POA: Diagnosis not present

## 2023-01-25 DIAGNOSIS — N179 Acute kidney failure, unspecified: Secondary | ICD-10-CM | POA: Diagnosis not present

## 2023-01-25 LAB — GLUCOSE, CAPILLARY
Glucose-Capillary: 100 mg/dL — ABNORMAL HIGH (ref 70–99)
Glucose-Capillary: 127 mg/dL — ABNORMAL HIGH (ref 70–99)
Glucose-Capillary: 201 mg/dL — ABNORMAL HIGH (ref 70–99)
Glucose-Capillary: 94 mg/dL (ref 70–99)

## 2023-01-25 NOTE — Progress Notes (Signed)
 [facility]                                                                              [place], is a 89 y.o. male, DOB - [birth year], FMW:[id number] Admit date - 01/18/2023    Outpatient Primary MD for the patient is Amon Aloysius BRAVO, MD  LOS - 5  days  Chief Complaint  Patient presents with   Back Pain   Nausea   Weakness       Brief summary   Patient is a 89 year old male with CAD/CABG, thoracic cord aneurysm, aortic insufficiency, CKD-3A, CVA, HTN, HLD, bladder cancer and recent diagnosis of vertebral metastasis for which she is followed by orthopedic surgery, presenting with progressive back pain for about 2 months.  No report of trauma or fall.  In ED, CT angio chest, abdomen and pelvis raised concern for subacute and pathologic T12 compression fracture without retropulsion or other complicating features, underlying lytic lesion and progressive loss of height, abnormal bladder base and prostate .  [facility], Dr. Burnetta consulted by EDP.  MRI thoracic spine obtained and showed multiple metastatic deposits throughout the thoracic spine, central disc protrusion at T8-9 without canal or foraminal stenosis, rightward disc protrusion and annular tear at T11-12 without focal stenosis but no pathologic fractures.  Oncology, urology and palliative consulted per recommendation by Ortho.  Patient underwent IR bone biopsy on 12/31.  Started on Casodex  for presumed prostate cancer.   Patient with AKI likely due to poor p.o. intake, ACE inhibitors and thiazide.  AKI resolved with fluid and holding nephrotoxic meds.     Assessment & Plan    Principal Problem:  Acute on chronic back pain: - CTA with subacute and pathologic appearing T12 compression fracture with underlying lytic lesion and loss of height progressed.  -MRI T-spine however showed no pathology, fractures, multiple metastatic deposits throughout the thoracic spine, central disc protrusion at T8-9 and  rightward disc fusion at T11-12 without focal stenosis.   -No acute focal neurosymptoms, pain improving.   -S/p IR bone biopsy on 12/31.  Pathology pending. -Started on Casodex  for presumed prostate cancer by oncology, will continue. -Continue scheduled Tylenol  with as needed oxycodone  and Dilaudid  for pain control -Continue decreased dose of gabapentin , 300 mg twice daily. 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  No pathologic fracture per MRI.  Unknown primary.  -  Patient has history of bladder cancer.  CT shows abnormal bladder base and prostate. -Urology, oncology consulted, s/p IR bone biopsy.  Biopsy results pending.    Acute kidney injury on  CKD-3A:  Likely due to poor p.o. intake, ACE inhibitor's and HCTZ.  Baseline creatinine has been 1.3-1.5 - Renal US  without acute finding but enlarged prostate.  AKI improved with IV fluid. -Creatinine plateaued at 2.44 on 12/30, improving, 1.0 today -Continue holding ACE, HCTZ  Acute metabolic encephalopathy: - Likely due to dehydration and gabapentin  -Mental status improving, no focal neurological deficits   DM-2 with hyperglycemia, neuropathy, hyperlipidemia and CKD-3A: -  A1c 6.5% on 8/28. -Continue sliding scale insulin   Recent Labs    01/24/23 1647 01/24/23 2057 01/25/23 0747  GLUCAP 138* 105* 100*      History of CAD/CABG:  -No acute issues, continue aspirin , propranolol     Essential hypertension/sinus bradycardia:  -Bradycardia resolved.  -Continue holding Lotensin , HCTZ, hydralazine . -Continue propranolol    Benign essential tremor -Continue home propranolol .   Glaucoma -Continue timolol  drops. -Follow-up with ophthalmology as an outpatient.   Physical deconditioning -PT recommended SNF  Estimated body mass index is 23.62 kg/m as calculated from the following:    Height as of this encounter: 6' 2 (1.88 m).   Weight as of this encounter: 83.5 kg.  Code Status: Full code DVT Prophylaxis:  enoxaparin  (LOVENOX ) injection 40 mg Start: 01/22/23 1200   Level of Care: Level of care: Med-Surg Family Communication: Updated patient Disposition Plan:      Remains inpatient appropriate: Awaiting SNF   Procedures:    Consultants:   Oncology Orthopedics  Antimicrobials:   Anti-infectives (From admission, onward)    None          Medications  acetaminophen   1,000 mg Oral Q8H   aspirin  EC  81 mg Oral Daily   bicalutamide   50 mg Oral Daily   cholecalciferol   1,000 Units Oral Daily   enoxaparin  (LOVENOX ) injection  40 mg Subcutaneous Q24H   feeding supplement  1 Container Oral TID BM   feeding supplement  237 mL Oral BID BM   gabapentin   300 mg Oral BID   guaiFENesin   600 mg Oral BID   insulin  aspart  0-9 Units Subcutaneous TID WC   lidocaine   1 patch Transdermal Q24H   multivitamin with minerals  1 tablet Oral Daily   nystatin    Topical TID   polyethylene glycol  17 g Oral Daily   propranolol   40 mg Oral Daily   rosuvastatin   20 mg Oral Daily   timolol   1 drop Both Eyes q morning      Subjective:   William Faulkner was seen and examined today.  No acute complaints.  Pain is controlled.   Patient denies dizziness, chest pain, shortness of breath, abdominal pain, N/V/D/C. No acute events overnight.    Objective:   Vitals:   01/24/23 2058 01/25/23 0515 01/25/23 1009 01/25/23 1011  BP: (!) 155/49 (!) 166/55 (!) 135/50 (!) 135/50  Pulse: (!) 59 78 64 64  Resp: 16 17 15    Temp: 98 F (36.7 C) (!) 97.5 F (36.4 C) 98 F (36.7 C)   TempSrc: Oral Oral Oral   SpO2: 95% 96% 96%   Weight:      Height:        Intake/Output Summary (Last 24 hours) at 01/25/2023 1159 Last data filed at 01/25/2023 1000 Gross per 24 hour  Intake 420 ml  Output 150 ml  Net 270 ml     Wt Readings from Last 3 Encounters:  01/18/23 83.5 kg  09/18/22  90.8 kg  01/08/22 92.5 kg   Physical Exam General: Alert and oriented x 3, NAD Cardiovascular: S1 S2 clear, RRR.  Respiratory: CTAB, no wheezing Gastrointestinal: Soft, nontender, nondistended, NBS Ext: no pedal edema bilaterally Neuro: no new deficits Psych: Normal affect     Data Reviewed:  I have personally reviewed following labs    CBC Lab Results  Component Value Date   WBC 6.5 01/24/2023   RBC 4.34 01/24/2023   HGB 12.4 (L) 01/24/2023   HCT 39.3 01/24/2023  MCV 90.6 01/24/2023   MCH 28.6 01/24/2023   PLT 302 01/24/2023   MCHC 31.6 01/24/2023   RDW 13.1 01/24/2023   LYMPHSABS 1.1 01/18/2023   MONOABS 0.5 01/18/2023   EOSABS 0.1 01/18/2023   BASOSABS 0.0 01/18/2023     Last metabolic panel Lab Results  Component Value Date   NA 140 01/24/2023   K 3.9 01/24/2023   CL 109 01/24/2023   CO2 22 01/24/2023   BUN 34 (H) 01/24/2023   CREATININE 1.08 01/24/2023   GLUCOSE 107 (H) 01/24/2023   GFRNONAA >60 01/24/2023   GFRAA 58 (L) 12/01/2019   CALCIUM  9.5 01/24/2023   PHOS 2.6 01/24/2023   PROT 7.6 01/18/2023   ALBUMIN 2.8 (L) 01/24/2023   LABGLOB 2.6 01/20/2023   AGRATIO 1.0 01/20/2023   BILITOT 0.9 01/18/2023   ALKPHOS 85 01/18/2023   AST 22 01/18/2023   ALT 16 01/18/2023   ANIONGAP 9 01/24/2023    CBG (last 3)  Recent Labs    01/24/23 1647 01/24/23 2057 01/25/23 0747  GLUCAP 138* 105* 100*      Coagulation Profile: Recent Labs  Lab 01/20/23 1925  INR 1.1     Radiology Studies: I have personally reviewed the imaging studies  DG Chest 2 View Result Date: 01/23/2023 CLINICAL DATA:  Follow-up pneumonia EXAM: CHEST - 2 VIEW COMPARISON:  01/18/2023 CT FINDINGS: Cardiac shadow is enlarged. Postsurgical changes and aortic calcifications are again noted. Lungs are well aerated bilaterally. Minimal left basilar atelectasis is seen. No bony abnormality is noted. IMPRESSION: Minimal left basilar atelectasis. Electronically Signed   By: Oneil Devonshire  M.D.   On: 01/23/2023 19:19       Tessa Seaberry M.D. [facility] 01/25/2023, 11:59 AM  Available via Epic secure chat 7am-7pm After 7 pm, please refer to night coverage provider listed on amion.

## 2023-01-25 NOTE — Plan of Care (Signed)
  Problem: Clinical Measurements: Goal: Ability to maintain clinical measurements within normal limits will improve Outcome: Progressing   Problem: Activity: Goal: Risk for activity intolerance will decrease Outcome: Progressing   Problem: Safety: Goal: Ability to remain free from injury will improve Outcome: Progressing   

## 2023-01-25 NOTE — TOC Progression Note (Signed)
 Transition of Care [facility]) - Progression Note    Patient Details  Name: JOVANY DISANO MRN: [id number] Date of Birth: [birth year]  Transition of Care [facility]) CM/SW Contact  Dalila Camellia JONELLE KEN Phone Number: 01/25/2023, 5:40 PM  Clinical Narrative:     Insurance authorization has been started for [facility].  Reference number [id number].   Expected Discharge Plan: Skilled Nursing Facility Barriers to Discharge: SNF Pending bed offer, Continued Medical Work up, English As A Second Language Teacher  Expected Discharge Plan and Services In-house Referral: Clinical Social Work   Post Acute Care Choice: Home Health Living arrangements for the past 2 months: Single Family Home Expected Discharge Date: 01/23/23               DME Arranged: N/A DME Agency: NA       HH Arranged: PT, OT HH Agency: [facility] Date [facility] Agency Contacted: 01/20/23 Time HH Agency Contacted: 1355 Representative spoke with at [facility] Agency: Cindie   Social Determinants of Health (SDOH) Interventions SDOH Screenings   Food Insecurity: No Food Insecurity (01/18/2023)  Housing: Low Risk  (01/18/2023)  Transportation Needs: No Transportation Needs (01/18/2023)  Utilities: Not At Risk (01/18/2023)  Depression (PHQ2-9): Low Risk  (09/18/2022)  Social Connections: Moderately Isolated (01/20/2023)  Tobacco Use: Medium Risk (01/18/2023)    Readmission Risk Interventions     No data to display

## 2023-01-26 DIAGNOSIS — N179 Acute kidney failure, unspecified: Secondary | ICD-10-CM | POA: Diagnosis not present

## 2023-01-26 DIAGNOSIS — C7951 Secondary malignant neoplasm of bone: Secondary | ICD-10-CM | POA: Diagnosis not present

## 2023-01-26 DIAGNOSIS — S22080A Wedge compression fracture of T11-T12 vertebra, initial encounter for closed fracture: Secondary | ICD-10-CM | POA: Diagnosis not present

## 2023-01-26 DIAGNOSIS — C61 Malignant neoplasm of prostate: Secondary | ICD-10-CM | POA: Diagnosis not present

## 2023-01-26 LAB — CBC
HCT: 37.4 % — ABNORMAL LOW (ref 39.0–52.0)
Hemoglobin: 11.7 g/dL — ABNORMAL LOW (ref 13.0–17.0)
MCH: 28.5 pg (ref 26.0–34.0)
MCHC: 31.3 g/dL (ref 30.0–36.0)
MCV: 91.2 fL (ref 80.0–100.0)
Platelets: 339 10*3/uL (ref 150–400)
RBC: 4.1 MIL/uL — ABNORMAL LOW (ref 4.22–5.81)
RDW: 13.1 % (ref 11.5–15.5)
WBC: 6.3 10*3/uL (ref 4.0–10.5)
nRBC: 0 % (ref 0.0–0.2)

## 2023-01-26 LAB — BASIC METABOLIC PANEL
Anion gap: 8 (ref 5–15)
BUN: 32 mg/dL — ABNORMAL HIGH (ref 8–23)
CO2: 26 mmol/L (ref 22–32)
Calcium: 9.3 mg/dL (ref 8.9–10.3)
Chloride: 104 mmol/L (ref 98–111)
Creatinine, Ser: 1.16 mg/dL (ref 0.61–1.24)
GFR, Estimated: >60 mL/min (ref >60)
Glucose, Bld: 115 mg/dL — ABNORMAL HIGH (ref 70–99)
Potassium: 4.2 mmol/L (ref 3.5–5.1)
Sodium: 138 mmol/L (ref 135–145)

## 2023-01-26 LAB — GLUCOSE, CAPILLARY
Glucose-Capillary: 133 mg/dL — ABNORMAL HIGH (ref 70–99)
Glucose-Capillary: 128 mg/dL — ABNORMAL HIGH (ref 70–99)
Glucose-Capillary: 147 mg/dL — ABNORMAL HIGH (ref 70–99)
Glucose-Capillary: 148 mg/dL — ABNORMAL HIGH (ref 70–99)

## 2023-01-26 MED ORDER — TRAMADOL HCL 50 MG PO TABS
50.0000 mg | ORAL_TABLET | Freq: Four times a day (QID) | ORAL | Status: DC | PRN
  Administered 2023-01-26: 50 mg via ORAL
  Filled 2023-01-26: qty 1

## 2023-01-26 NOTE — Progress Notes (Signed)
 [facility]                                                                              Eveleth, is a 89 y.o. male, DOB - September 01, 1934, FMW:[id number] Admit date - 01/18/2023    Outpatient Primary MD for the patient is Amon Aloysius BRAVO, MD  LOS - 6  days  Chief Complaint  Patient presents with   Back Pain   Nausea   Weakness       Brief summary   Patient is a 89 year old male with CAD/CABG, thoracic cord aneurysm, aortic insufficiency, CKD-3A, CVA, HTN, HLD, bladder cancer and recent diagnosis of vertebral metastasis for which she is followed by orthopedic surgery, presenting with progressive back pain for about 2 months.  No report of trauma or fall.  In ED, CT angio chest, abdomen and pelvis raised concern for subacute and pathologic T12 compression fracture without retropulsion or other complicating features, underlying lytic lesion and progressive loss of height, abnormal bladder base and prostate .  [facility], Dr. Burnetta consulted by EDP.  MRI thoracic spine obtained and showed multiple metastatic deposits throughout the thoracic spine, central disc protrusion at T8-9 without canal or foraminal stenosis, rightward disc protrusion and annular tear at T11-12 without focal stenosis but no pathologic fractures.  Oncology, urology and palliative consulted per recommendation by Ortho.  Patient underwent IR bone biopsy on 12/31.  Started on Casodex  for presumed prostate cancer.   Patient with AKI likely due to poor p.o. intake, ACE inhibitors and thiazide.  AKI resolved with fluid and holding nephrotoxic meds.     Assessment & Plan    Principal Problem:  Acute on chronic back pain: - CTA with subacute and pathologic appearing T12 compression fracture with underlying lytic lesion and loss of height progressed.  -MRI T-spine however showed no pathology, fractures, multiple metastatic deposits throughout the thoracic spine, central disc protrusion at T8-9 and  rightward disc fusion at T11-12 without focal stenosis.   -No acute focal neurosymptoms, pain improving.   -S/p IR bone biopsy on 12/31.  Pathology pending. -Started on Casodex  for presumed prostate cancer by oncology, will continue. -Continue Tylenol , added oral tramadol , as needed for the pain.  Family requested no oxycodone  due to confusion.  -Continue decreased dose of gabapentin , 300 mg twice daily. 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  No pathologic fracture per MRI.  Unknown primary.  -  Patient has history of bladder cancer.  CT shows abnormal bladder base and prostate. -Urology, oncology consulted, s/p IR bone biopsy.  Biopsy result still pending    Acute kidney injury on  CKD-3A:  Likely due to poor p.o. intake, ACE inhibitor's and HCTZ.  Baseline creatinine has been 1.3-1.5 - Renal US  without acute finding but enlarged prostate.  AKI improved with IV fluid. -Creatinine plateaued at 2.44 on 12/30, improving, 1.0 today -Continue holding ACE, HCTZ  Acute metabolic encephalopathy: - Likely due to dehydration and gabapentin  -Mental status improving, no focal neurological deficits   DM-2  with hyperglycemia, neuropathy, hyperlipidemia and CKD-3A: -  A1c 6.5% on 8/28. -Continue sliding scale insulin   Recent Labs    01/25/23 2129 01/26/23 0716 01/26/23 1133  GLUCAP 94 133* 128*      History of CAD/CABG:  -No acute issues, continue aspirin , propranolol     Essential hypertension/sinus bradycardia:  -Bradycardia resolved.  -Continue holding Lotensin , HCTZ, hydralazine . -Continue propranolol    Benign essential tremor -Continue home propranolol .   Glaucoma -Continue timolol  drops. -Follow-up with ophthalmology as an outpatient.   Physical deconditioning -PT recommended SNF  Estimated body mass index is 23.62 kg/m as  calculated from the following:   Height as of this encounter: 6' 2 (1.88 m).   Weight as of this encounter: 83.5 kg.  Code Status: Full code DVT Prophylaxis:  enoxaparin  (LOVENOX ) injection 40 mg Start: 01/22/23 1200   Level of Care: Level of care: Med-Surg Family Communication: Updated patient Disposition Plan:      Remains inpatient appropriate: No acute complaints, plan for DC to SNF in a.m.   Procedures:    Consultants:   Oncology Orthopedics  Antimicrobials:   Anti-infectives (From admission, onward)    None          Medications  acetaminophen   1,000 mg Oral Q8H   aspirin  EC  81 mg Oral Daily   bicalutamide   50 mg Oral Daily   cholecalciferol   1,000 Units Oral Daily   enoxaparin  (LOVENOX ) injection  40 mg Subcutaneous Q24H   feeding supplement  1 Container Oral TID BM   feeding supplement  237 mL Oral BID BM   gabapentin   300 mg Oral BID   guaiFENesin   600 mg Oral BID   insulin  aspart  0-9 Units Subcutaneous TID WC   lidocaine   1 patch Transdermal Q24H   multivitamin with minerals  1 tablet Oral Daily   nystatin    Topical TID   polyethylene glycol  17 g Oral Daily   propranolol   40 mg Oral Daily   rosuvastatin   20 mg Oral Daily   timolol   1 drop Both Eyes q morning      Subjective:   William Faulkner was seen and examined today.  Patient asking when he will be able to go to rehab.  Pain currently controlled.  Patient denies dizziness, chest pain, shortness of breath, abdominal pain, N/V/D/C. No fevers.  Objective:   Vitals:   01/25/23 1011 01/25/23 1358 01/25/23 2127 01/26/23 0541  BP: (!) 135/50 (!) 127/58 (!) 158/58 (!) 145/98  Pulse: 64 (!) 59 61 (!) 56  Resp:  15 16 16   Temp:  97.7 F (36.5 C) 97.9 F (36.6 C) 98 F (36.7 C)  TempSrc:  Oral Oral Oral  SpO2:  96% 97% 93%  Weight:      Height:        Intake/Output Summary (Last 24 hours) at 01/26/2023 1315 Last data filed at 01/26/2023 1155 Gross per 24 hour  Intake 690 ml  Output 200  ml  Net 490 ml     Wt Readings from Last 3 Encounters:  01/18/23 83.5 kg  09/18/22 90.8 kg  01/08/22 92.5 kg   Physical Exam General: Alert and oriented x 3, NAD Cardiovascular: S1 S2 clear, RRR.  Respiratory: CTAB Gastrointestinal: Soft, nontender, nondistended, NBS Ext: no pedal edema bilaterally Neuro: no new deficits Psych: Normal affect    Data Reviewed:  I have personally reviewed following labs    CBC Lab Results  Component Value Date   WBC 6.3 01/26/2023  RBC 4.10 (L) 01/26/2023   HGB 11.7 (L) 01/26/2023   HCT 37.4 (L) 01/26/2023   MCV 91.2 01/26/2023   MCH 28.5 01/26/2023   PLT 339 01/26/2023   MCHC 31.3 01/26/2023   RDW 13.1 01/26/2023   LYMPHSABS 1.1 01/18/2023   MONOABS 0.5 01/18/2023   EOSABS 0.1 01/18/2023   BASOSABS 0.0 01/18/2023     Last metabolic panel Lab Results  Component Value Date   NA 138 01/26/2023   K 4.2 01/26/2023   CL 104 01/26/2023   CO2 26 01/26/2023   BUN 32 (H) 01/26/2023   CREATININE 1.16 01/26/2023   GLUCOSE 115 (H) 01/26/2023   GFRNONAA >60 01/26/2023   GFRAA 58 (L) 12/01/2019   CALCIUM  9.3 01/26/2023   PHOS 2.6 01/24/2023   PROT 7.6 01/18/2023   ALBUMIN 2.8 (L) 01/24/2023   LABGLOB 2.6 01/20/2023   AGRATIO 1.0 01/20/2023   BILITOT 0.9 01/18/2023   ALKPHOS 85 01/18/2023   AST 22 01/18/2023   ALT 16 01/18/2023   ANIONGAP 8 01/26/2023    CBG (last 3)  Recent Labs    01/25/23 2129 01/26/23 0716 01/26/23 1133  GLUCAP 94 133* 128*      Coagulation Profile: Recent Labs  Lab 01/20/23 1925  INR 1.1     Radiology Studies: I have personally reviewed the imaging studies  No results found.      Nydia Distance M.D. [facility] 01/26/2023, 1:15 PM  Available via Epic secure chat 7am-7pm After 7 pm, please refer to night coverage provider listed on amion.

## 2023-01-26 NOTE — TOC Progression Note (Signed)
 Transition of Care [facility]) - Progression Note    Patient Details  Name: William Faulkner MRN: [id number] Date of Birth: [birth year]  Transition of Care [facility]) CM/SW Contact  Dalila Camellia [place], [place] Phone Number: 01/26/2023, 12:01 PM  Clinical Narrative:     Shara is still pending for [facility] SNF.  Expected Discharge Plan: Skilled Nursing Facility Barriers to Discharge: SNF Pending bed offer, Continued Medical Work up, English As A Second Language Teacher  Expected Discharge Plan and Services In-house Referral: Clinical Social Work   Post Acute Care Choice: Home Health Living arrangements for the past 2 months: Single Family Home Expected Discharge Date: 01/23/23               DME Arranged: N/A DME Agency: NA       HH Arranged: PT, OT HH Agency: [facility] Date [facility] Agency Contacted: 01/20/23 Time HH Agency Contacted: 1355 Representative spoke with at [facility] Agency: Cindie   Social Determinants of Health (SDOH) Interventions SDOH Screenings   Food Insecurity: No Food Insecurity (01/18/2023)  Housing: Low Risk  (01/18/2023)  Transportation Needs: No Transportation Needs (01/18/2023)  Utilities: Not At Risk (01/18/2023)  Depression (PHQ2-9): Low Risk  (09/18/2022)  Social Connections: Moderately Isolated (01/20/2023)  Tobacco Use: Medium Risk (01/18/2023)    Readmission Risk Interventions     No data to display

## 2023-01-26 NOTE — Plan of Care (Signed)
  Problem: Clinical Measurements: Goal: Respiratory complications will improve Outcome: Progressing   Problem: Activity: Goal: Risk for activity intolerance will decrease Outcome: Progressing   Problem: Pain Management: Goal: General experience of comfort will improve Outcome: Progressing

## 2023-01-26 NOTE — Plan of Care (Signed)
  Problem: Clinical Measurements: Goal: Ability to maintain clinical measurements within normal limits will improve Outcome: Progressing Goal: Will remain free from infection Outcome: Progressing Goal: Diagnostic test results will improve Outcome: Progressing Goal: Respiratory complications will improve Outcome: Progressing Goal: Cardiovascular complication will be avoided Outcome: Progressing   Problem: Activity: Goal: Risk for activity intolerance will decrease Outcome: Progressing   Problem: Pain Management: Goal: General experience of comfort will improve Outcome: Progressing   Problem: Safety: Goal: Ability to remain free from injury will improve Outcome: Progressing   Problem: Fluid Volume: Goal: Ability to maintain a balanced intake and output will improve Outcome: Progressing   Problem: Health Behavior/Discharge Planning: Goal: Ability to identify and utilize available resources and services will improve Outcome: Progressing Goal: Ability to manage health-related needs will improve Outcome: Progressing   Problem: Metabolic: Goal: Ability to maintain appropriate glucose levels will improve Outcome: Progressing

## 2023-01-26 NOTE — Progress Notes (Signed)
   01/26/23 1800  What Happened  Was fall witnessed? Yes  Who witnessed fall? [place] ([place])  Patients activity before fall ambulating-assisted  Point of contact other (comment) (none-gently assisted to ground)  Was patient injured? No  Provider Notification  Provider Name/Title Rai  Date Provider Notified 01/26/23  Time Provider Notified 1817  Method of Notification Page  Notification Reason Fall  Provider response See new orders  Date of Provider Response 01/26/23  Time of Provider Response 1817  Follow Up  Family notified No - patient refusal (patient will call)  Adult Fall Risk Assessment  Risk Factor Category (scoring not indicated) High fall risk per protocol (document High fall risk)  Patient Fall Risk Level High fall risk  Adult Fall Risk Interventions  Required Bundle Interventions *See Row Information* High fall risk - low, moderate, and high requirements implemented  Additional Interventions Use of appropriate toileting equipment (bedpan, BSC, etc.);PT/OT need assessed if change in mobility from baseline  Fall intervention(s) refused/Patient educated regarding refusal Bed alarm;Nonskid socks;Yellow bracelet  Screening for Fall Injury Risk (To be completed on HIGH fall risk patients) - Assessing Need for Floor Mats  Risk For Fall Injury- Criteria for Floor Mats 85 years or older  Will Implement Floor Mats Yes  Pain Assessment  Pain Scale 0-10  Pain Score 0  Neurological  Neuro (WDL) WDL

## 2023-01-27 ENCOUNTER — Other Ambulatory Visit: Payer: Self-pay

## 2023-01-27 ENCOUNTER — Ambulatory Visit
Admit: 2023-01-27 | Discharge: 2023-01-27 | Disposition: A | Discharge status: Home / Self Care | Payer: Medicare Other | Attending: Radiation Oncology | Admitting: Radiation Oncology

## 2023-01-27 DIAGNOSIS — R972 Elevated prostate specific antigen [PSA]: Secondary | ICD-10-CM | POA: Diagnosis not present

## 2023-01-27 DIAGNOSIS — S22080A Wedge compression fracture of T11-T12 vertebra, initial encounter for closed fracture: Secondary | ICD-10-CM | POA: Diagnosis not present

## 2023-01-27 DIAGNOSIS — N179 Acute kidney failure, unspecified: Secondary | ICD-10-CM | POA: Diagnosis not present

## 2023-01-27 DIAGNOSIS — C7951 Secondary malignant neoplasm of bone: Secondary | ICD-10-CM | POA: Insufficient documentation

## 2023-01-27 DIAGNOSIS — Z515 Encounter for palliative care: Secondary | ICD-10-CM | POA: Diagnosis not present

## 2023-01-27 DIAGNOSIS — Z79899 Other long term (current) drug therapy: Secondary | ICD-10-CM

## 2023-01-27 DIAGNOSIS — C61 Malignant neoplasm of prostate: Secondary | ICD-10-CM

## 2023-01-27 DIAGNOSIS — Z510 Encounter for antineoplastic radiation therapy: Secondary | ICD-10-CM | POA: Insufficient documentation

## 2023-01-27 DIAGNOSIS — Z7189 Other specified counseling: Secondary | ICD-10-CM

## 2023-01-27 DIAGNOSIS — E43 Unspecified severe protein-calorie malnutrition: Secondary | ICD-10-CM

## 2023-01-27 LAB — CBC
HCT: 45.8 % (ref 39.0–52.0)
Hemoglobin: 14.7 g/dL (ref 13.0–17.0)
MCH: 28.8 pg (ref 26.0–34.0)
MCHC: 32.1 g/dL (ref 30.0–36.0)
MCV: 89.8 fL (ref 80.0–100.0)
Platelets: 456 10*3/uL — ABNORMAL HIGH (ref 150–400)
RBC: 5.1 MIL/uL (ref 4.22–5.81)
RDW: 13.1 % (ref 11.5–15.5)
WBC: 6.8 10*3/uL (ref 4.0–10.5)
nRBC: 0 % (ref 0.0–0.2)

## 2023-01-27 LAB — COMPREHENSIVE METABOLIC PANEL
ALT: 27 U/L (ref 0–44)
AST: 24 U/L (ref 15–41)
Albumin: 3.5 g/dL (ref 3.5–5.0)
Alkaline Phosphatase: 100 U/L (ref 38–126)
Anion gap: 11 (ref 5–15)
BUN: 25 mg/dL — ABNORMAL HIGH (ref 8–23)
CO2: 26 mmol/L (ref 22–32)
Calcium: 10.1 mg/dL (ref 8.9–10.3)
Chloride: 100 mmol/L (ref 98–111)
Creatinine, Ser: 1.12 mg/dL (ref 0.61–1.24)
GFR, Estimated: >60 mL/min (ref >60)
Glucose, Bld: 136 mg/dL — ABNORMAL HIGH (ref 70–99)
Potassium: 4.4 mmol/L (ref 3.5–5.1)
Sodium: 137 mmol/L (ref 135–145)
Total Bilirubin: 0.8 mg/dL (ref 0.0–1.2)
Total Protein: 7.9 g/dL (ref 6.5–8.1)

## 2023-01-27 LAB — GLUCOSE, CAPILLARY
Glucose-Capillary: 122 mg/dL — ABNORMAL HIGH (ref 70–99)
Glucose-Capillary: 131 mg/dL — ABNORMAL HIGH (ref 70–99)
Glucose-Capillary: 111 mg/dL — ABNORMAL HIGH (ref 70–99)
Glucose-Capillary: 125 mg/dL — ABNORMAL HIGH (ref 70–99)

## 2023-01-27 LAB — SURGICAL PATHOLOGY

## 2023-01-27 LAB — AMMONIA: Ammonia: 12 umol/L (ref 9–35)

## 2023-01-27 MED ORDER — DEXAMETHASONE SODIUM PHOSPHATE 4 MG/ML IJ SOLN
2.0000 mg | Freq: Once | INTRAMUSCULAR | Status: AC
Start: 2023-01-27 — End: 2023-01-27
  Administered 2023-01-27: 2 mg via INTRAVENOUS
  Filled 2023-01-27: qty 1

## 2023-01-27 MED ORDER — DEXAMETHASONE 4 MG PO TABS
2.0000 mg | ORAL_TABLET | Freq: Two times a day (BID) | ORAL | Status: DC
Start: 2023-01-28 — End: 2023-01-29
  Administered 2023-01-28 (×2): 2 mg via ORAL
  Filled 2023-01-27 (×2): qty 1

## 2023-01-27 MED ORDER — APALUTAMIDE 60 MG PO TABS: 180.0000 mg | ORAL_TABLET | Freq: Every day | ORAL | 11 refills | Status: DC

## 2023-01-27 NOTE — Progress Notes (Signed)
 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     ICD-10-CM   1. Metastasis to bone [facility])  C79.51       HISTORY OF PRESENT ILLNESS::William Faulkner is a 89 y.o. male who presented on 12/28 with back pain found to have diffuse bony lesions on CT and subsequent MRI.  He was also found to have an with elevated PSA of 261 on 01/20/23. Biopsy of T12 was performed by IR on 12/31 and he started bicalutamide  in the meantime. His back pain reaches 8 out of 10 and limits his mobility and quality of life.  He has been referred today for consideration of palliative radiotherapy.  PREVIOUS RADIATION THERAPY: No  Past Medical History:  Diagnosis Date   AORTIC ANEUR UNSPEC SITE WITHOUT MENTION RUPTURE    AORTIC INSUFFICIENCY    BPH (benign prostatic hyperplasia)    CAD, ARTERY BYPASS GRAFT    Cancer (HCC)    CEREBROVASCULAR DISEASE    DIAB W/O COMP TYPE II/UNS NOT STATED UNCNTRL    Hyperlipidemia    HYPERTENSION, ESSENTIAL NOS    NEOPLASM, MALIGNANT, BLADDER, HX OF    Rotator cuff tear    Left shoulder,   :   Past Surgical History:  Procedure Laterality Date   BACK SURGERY  2003   lower   BLADDER TUMOR EXCISION     Surgery x6, Dr.Tannebaum   BLEPHAROPLASTY  1980   bilaterally, Dr Roz   COLONOSCOPY W/ POLYPECTOMY     Dr Abran [facility]   CORONARY ARTERY BYPASS GRAFT  2011   x 5   CYSTOSCOPY  09/02/2012   INGUINAL HERNIA REPAIR  10/24/2011   Procedure: HERNIA REPAIR INGUINAL ADULT;  Surgeon: Krystal CHRISTELLA Spinner, MD;  Location: [facility];  Service: General;  Laterality: Right;  Repair Right Inguinal Hernia with Mesh, Umbilical Hernia Repair with Mesh   IR FLUORO GUIDED  NEEDLE PLC ASPIRATION/INJECTION LOC  01/21/2023   LUMBAR EPIDURAL INJECTION  2008   ROTATOR CUFF REPAIR  2009   right; Dr Amy   SHOULDER ARTHROSCOPY WITH ROTATOR CUFF REPAIR AND SUBACROMIAL DECOMPRESSION  02/14/2014   Right, SAD/DCR, biceps tenodesis   UMBILICAL HERNIA REPAIR  10/24/2011   Procedure: HERNIA REPAIR UMBILICAL ADULT;  Surgeon: Krystal CHRISTELLA Spinner, MD;  Location: [facility];  Service: General;  Laterality: N/A;  :  No current facility-administered medications for this encounter.  Current Outpatient Medications:    acetaminophen  (TYLENOL ) 500 MG tablet, Take 2 tablets (1,000 mg total) by mouth every 8 (eight) hours for 5 days, THEN 2 tablets (1,000 mg total) every 8 (eight) hours as needed for up to 5 days., Disp: , Rfl:    bicalutamide  (CASODEX ) 50 MG tablet, Take 1 tablet (50 mg total) by mouth daily., Disp: 30 tablet, Rfl: 2   feeding supplement (ENSURE ENLIVE / ENSURE PLUS) LIQD, Take 237 mLs by mouth 2 (two) times daily between meals., Disp: 14220 mL, Rfl: 0   gabapentin  (NEURONTIN ) 300 MG capsule, Take 1 capsule (300 mg total) by mouth 3 (three) times daily., Disp: 90 capsule, Rfl: 11   oxyCODONE  (OXY IR/ROXICODONE ) 5 MG immediate release tablet, Take 1 tablet (5 mg total) by mouth every 8 (eight) hours as  needed for up to 7 days for moderate pain (pain score 4-6)., Disp: 21 tablet, Rfl: 0   polyethylene glycol powder (MIRALAX ) 17 GM/SCOOP powder, Take 17 g by mouth 2 (two) times daily as needed for mild constipation., Disp: 255 g, Rfl: 1   propranolol  (INDERAL ) 20 MG tablet, Take 1 tablet (20 mg total) by mouth daily., Disp: 30 tablet, Rfl: 2   senna-docusate (SENOKOT-S) 8.6-50 MG tablet, Take 1 tablet by mouth 2 (two) times daily between meals as needed for mild constipation., Disp: , Rfl:   Facility-Administered Medications Ordered in Other Encounters:    acetaminophen  (TYLENOL ) tablet 1,000 mg, 1,000 mg, Oral, Q8H, Gonfa, Taye T, MD, 1,000 mg at 01/27/23 0543   aspirin  EC  tablet 81 mg, 81 mg, Oral, Daily, Celinda Alm Lot, MD, 81 mg at 01/27/23 1040   bicalutamide  (CASODEX ) tablet 50 mg, 50 mg, Oral, Daily, Tina Pauletta BROCKS, MD, 50 mg at 01/27/23 1040   cholecalciferol  (VITAMIN D3) 25 MCG (1000 UNIT) tablet 1,000 Units, 1,000 Units, Oral, Daily, Celinda Alm Lot, MD, 1,000 Units at 01/27/23 1041   enoxaparin  (LOVENOX ) injection 40 mg, 40 mg, Subcutaneous, Q24H, Gonfa, Taye T, MD, 40 mg at 01/26/23 1315   feeding supplement (BOOST / RESOURCE BREEZE) liquid 1 Container, 1 Container, Oral, TID BM, Rai, Ripudeep K, MD, 1 Container at 01/26/23 2116   feeding supplement (ENSURE ENLIVE / ENSURE PLUS) liquid 237 mL, 237 mL, Oral, BID BM, Gonfa, Taye T, MD, 237 mL at 01/27/23 1042   gabapentin  (NEURONTIN ) capsule 300 mg, 300 mg, Oral, BID, Gonfa, Taye T, MD, 300 mg at 01/27/23 1040   guaiFENesin  (MUCINEX ) 12 hr tablet 600 mg, 600 mg, Oral, BID, Gonfa, Taye T, MD, 600 mg at 01/27/23 1040   HYDROmorphone  (DILAUDID ) injection 0.5 mg, 0.5 mg, Intravenous, Q4H PRN, Gonfa, Taye T, MD   insulin  aspart (novoLOG ) injection 0-9 Units, 0-9 Units, Subcutaneous, TID WC, Gonfa, Taye T, MD, 1 Units at 01/27/23 0800   lidocaine  (LIDODERM ) 5 % 1 patch, 1 patch, Transdermal, Q24H, Chavez, Abigail, NP, 1 patch at 01/26/23 2115   multivitamin with minerals tablet 1 tablet, 1 tablet, Oral, Daily, Rai, Ripudeep K, MD, 1 tablet at 01/27/23 1041   naloxone  (NARCAN ) injection 0.4 mg, 0.4 mg, Intravenous, PRN, Celinda Alm Lot, MD   nystatin  (MYCOSTATIN /NYSTOP ) topical powder, , Topical, TID, Celinda Alm Lot, MD, Given at 01/27/23 1040   ondansetron  (ZOFRAN ) tablet 4 mg, 4 mg, Oral, Q6H PRN **OR** ondansetron  (ZOFRAN ) injection 4 mg, 4 mg, Intravenous, Q6H PRN, Celinda Alm Lot, MD, 4 mg at 01/19/23 9362   polyethylene glycol (MIRALAX  / GLYCOLAX ) packet 17 g, 17 g, Oral, Daily, Celinda Alm Lot, MD, 17 g at 01/27/23 1040   propranolol  (INDERAL ) tablet 40 mg, 40 mg, Oral, Daily, Gonfa,  Taye T, MD, 40 mg at 01/27/23 1041   rosuvastatin  (CRESTOR ) tablet 20 mg, 20 mg, Oral, Daily, Celinda Alm Lot, MD, 20 mg at 01/27/23 1041   timolol  (TIMOPTIC ) 0.5 % ophthalmic solution 1 drop, 1 drop, Both Eyes, q morning, Celinda Alm Lot, MD, 1 drop at 01/27/23 1040:   Allergies  Allergen Reactions   Atorvastatin Other (See Comments)    REACTION: increased cpk   Celecoxib Other (See Comments)    REACTION: palpitations= celebrex   Sulfonamide Derivatives Other (See Comments)    Unknown   Zolpidem Tartrate Other (See Comments)    REACTION: made patient feel weird= ambien  :   Family History  Problem Relation Age of  Onset   Diabetes Mother    Tremor Father    Other Sister    Stroke Neg Hx    Heart disease Neg Hx    Cancer Neg Hx   :   Social History   Socioeconomic History   Marital status: Married    Spouse name: Not on file   Number of children: 3   Years of education: Not on file   Highest education level: Not on file  Occupational History   Occupation: retired  Tobacco Use   Smoking status: Former    Current packs/day: 0.00    Average packs/day: 0.3 packs/day for 15.0 years (4.5 ttl pk-yrs)    Types: Cigarettes    Start date: 01/21/1969    Quit date: 01/22/1984    Years since quitting: 39.0   Smokeless tobacco: Never  Vaping Use   Vaping status: Never Used  Substance and Sexual Activity   Alcohol use: No    Alcohol/week: 0.0 standard drinks of alcohol    Comment: rare, wine    Drug use: No   Sexual activity: Not on file  Other Topics Concern   Not on file  Social History Narrative   8 G-children   Married x 64 years   Lives with wife in a one story home.  Has 3 children.     Retired from [employer].  Education: college.    Right handed   Drinks caffeine   One story home   Social Drivers of Health   Financial Resource Strain: Not on file  Food Insecurity: No Food Insecurity (01/18/2023)   Hunger Vital Sign    Worried About Running Out of Food in  the Last Year: Never true    Ran Out of Food in the Last Year: Never true  Transportation Needs: No Transportation Needs (01/18/2023)   PRAPARE - Administrator, Civil Service (Medical): No    Lack of Transportation (Non-Medical): No  Physical Activity: Not on file  Stress: Not on file  Social Connections: Moderately Isolated (01/20/2023)   Social Connection and Isolation Panel [NHANES]    Frequency of Communication with Friends and Family: More than three times a week    Frequency of Social Gatherings with Friends and Family: More than three times a week    Attends Religious Services: Never    Database Administrator or Organizations: No    Attends Banker Meetings: Never    Marital Status: Married  Catering Manager Violence: Not At Risk (01/18/2023)   Humiliation, Afraid, Rape, and Kick questionnaire    Fear of Current or Ex-Partner: No    Emotionally Abused: No    Physically Abused: No    Sexually Abused: No  :  REVIEW OF SYSTEMS:  A 15 point review of systems is documented in the electronic medical record. This was obtained by the nursing staff. However, I reviewed this with the patient to discuss relevant findings and make appropriate changes.  Pertinent items are noted in HPI.   PHYSICAL EXAM:  Vitals:    01/25/23 1011 01/25/23 1358 01/25/23 2127 01/26/23 0541  BP: (!) 135/50 (!) 127/58 (!) 158/58 (!) 145/98  Pulse: 64 (!) 59 61 (!) 56  Resp:   15 16 16   Temp:   97.7 F (36.5 C) 97.9 F (36.6 C) 98 F (36.7 C)  TempSrc:   Oral Oral Oral  SpO2:   96% 97% 93%  Weight:          Height:  Intake/Output Summary (Last 24 hours) at 01/26/2023 1315 Last data filed at 01/26/2023 1155    Gross per 24 hour  Intake 690 ml  Output 200 ml  Net 490 ml           Wt Readings from Last 3 Encounters:  01/18/23 83.5 kg  09/18/22 90.8 kg  01/08/22 92.5 kg    Physical Exam General: Alert and oriented x 3, NAD Cardiovascular: S1 S2 clear, RRR.   Respiratory: CTAB Gastrointestinal: Soft, nontender, nondistended, NBS Ext: no pedal edema bilaterally Neuro: no new deficits Psych: Normal affect    KPS = 60  100 - Normal; no complaints; no evidence of disease. 90   - Able to carry on normal activity; minor signs or symptoms of disease. 80   - Normal activity with effort; some signs or symptoms of disease. 84   - Cares for self; unable to carry on normal activity or to do active work. 60   - Requires occasional assistance, but is able to care for most of his personal needs. 50   - Requires considerable assistance and frequent medical care. 40   - Disabled; requires special care and assistance. 30   - Severely disabled; hospital admission is indicated although death not imminent. 20   - Very sick; hospital admission necessary; active supportive treatment necessary. 10   - Moribund; fatal processes progressing rapidly. 0     - Dead  Karnofsky DA, Abelmann WH, Craver LS and Burchenal JH [phone]) The use of the nitrogen mustards in the palliative treatment of carcinoma: with particular reference to bronchogenic carcinoma Cancer 1 634-56  LABORATORY DATA:  Lab Results  Component Value Date   WBC 6.8 01/27/2023   HGB 14.7 01/27/2023   HCT 45.8 01/27/2023   MCV 89.8 01/27/2023   PLT 456 (H) 01/27/2023   Lab Results  Component Value Date   NA 137 01/27/2023   K 4.4 01/27/2023   CL 100 01/27/2023   CO2 26 01/27/2023   Lab Results  Component Value Date   ALT 27 01/27/2023   AST 24 01/27/2023   ALKPHOS 100 01/27/2023   BILITOT 0.8 01/27/2023     RADIOGRAPHY: DG Chest 2 View Result Date: 01/23/2023 CLINICAL DATA:  Follow-up pneumonia EXAM: CHEST - 2 VIEW COMPARISON:  01/18/2023 CT FINDINGS: Cardiac shadow is enlarged. Postsurgical changes and aortic calcifications are again noted. Lungs are well aerated bilaterally. Minimal left basilar atelectasis is seen. No bony abnormality is noted. IMPRESSION: Minimal left basilar  atelectasis. Electronically Signed   By: Oneil Devonshire M.D.   On: 01/23/2023 19:19   IR Fluoro Guide Ndl Plmt / BX Result Date: 01/21/2023 INDICATION: Pathologic fracture. EXAM: FLUOROSCOPY GUIDED T12 CORE BONE BIOPSY MEDICATIONS: None. ANESTHESIA/SEDATION: Moderate (conscious) sedation was employed during this procedure. A total of Versed  1.5 mg and Fentanyl  75 mcg was administered intravenously by the radiology nurse. Total intra-service moderate Sedation Time: 29 minutes. The patient's level of consciousness and vital signs were monitored continuously by radiology nursing throughout the procedure under my direct supervision. Fluoroscopy does: 657 mGy air Kerma. COMPLICATIONS: None immediate. PROCEDURE: Informed written consent was obtained from the patient after a thorough discussion of the procedural risks, benefits and alternatives. All questions were addressed. Maximal Sterile Barrier Technique was utilized including caps, mask, sterile gowns, sterile gloves, sterile drape, hand hygiene and skin antiseptic. A timeout was performed prior to the initiation of the procedure. The patient was placed in prone position on the angiography table. The thoracic spine  region was prepped and draped in a sterile fashion. Under fluoroscopy, the T12 vertebral body was delineated and the skin area was marked. The skin was infiltrated with a 1% Lidocaine  approximately 3 cm lateral to the spinous process projection on the right. Using a 22-gauge spinal needle, the soft issue and the peripedicular space and periosteum were infiltrated with Bupivacaine  0.5%. A skin incision was made at the access site. Subsequently, an 11-gauge Kyphon trocar was inserted under fluoroscopic guidance until contact with the pedicle was obtained. The trocar was inserted under light hammer tapping into the pedicle until the posterior boundaries of the vertebral body was reached. The diamond mandrill was removed and 3 core bone biopsy samples were  obtained. The trocar was later removed. The access site was cleaned and covered with a sterile bandage. IMPRESSION: Fluoroscopy guided T12 core bone biopsy performed using tiny fragments on each core biopsy pass. Samples obtained were sent for tissue exam. Electronically Signed   By: [name] M.D.   On: 01/21/2023 12:46   DG Bone Survey Met Result Date: 01/20/2023 CLINICAL DATA:  Lytic bone lesions on x-ray. Multiple metastatic deposits throughout the thoracic spine. EXAM: METASTATIC BONE SURVEY COMPARISON:  Thoracic spine MRI 01/18/2023. Chest abdomen pelvis CT 01/18/2023. FINDINGS: Patient with known lesions in the thoracic and lumbar spine. This was better assessed on recent CT and MRI. The majority of these lesions are not well-defined by radiograph. The known T12 compression fracture is not well assessed on the current exam due to osseous overlap. The known left iliac sclerotic lesion is not well seen by radiograph. Area of sclerosis projects over the lateral aspect of the right distal tibia at the ankle joint. Other there are extremity lesion is seen by radiograph. Prior median sternotomy. Blunting of the left costophrenic angle, likely epicardial fat pad and atelectasis. Aortic atherosclerosis and vascular calcifications. IMPRESSION: 1. Patient with known spinal osseous bone lesions. This was better assessed on recent CT and MRI. The majority of these lesions are not well-defined by radiograph. 2. The known T12 compression fracture is not well assessed on the current exam due to osseous overlap. 3. The known left iliac sclerotic lesion is not well seen by radiograph. 4. Area of sclerosis projects over the lateral aspect of the right distal tibia at the ankle joint, indeterminate. Electronically Signed   By: Andrea Gasman M.D.   On: 01/20/2023 22:50   US  RENAL Result Date: 01/20/2023 CLINICAL DATA:  Acute kidney injury EXAM: RENAL / URINARY TRACT ULTRASOUND COMPLETE COMPARISON:   CT 01/18/2023 FINDINGS: Right Kidney: Renal measurements: 9.1 x 4.7 x 4.4 cm = volume: 9.5 mL. Echogenicity within normal limits. No mass or hydronephrosis visualized. Left Kidney: Renal measurements: 11.9 x 5.3 x 5 cm = volume: 165 mL. Echogenicity within normal limits. No mass or hydronephrosis visualized. Bladder: Appears normal for degree of bladder distention. Other: Prostate enlargement at least 5.7 x 5.3 cm. IMPRESSION: 1. Negative for hydronephrosis. 2. Prostate enlargement. Electronically Signed   By: JONETTA Faes M.D.   On: 01/20/2023 16:00   MR THORACIC SPINE WO CONTRAST Result Date: 01/18/2023 CLINICAL DATA:  Bladder cancer. Evaluate for metastatic disease to the thoracic spine. Chronic back pain. Weakness beginning approximately 1 month ago. EXAM: MRI THORACIC SPINE WITHOUT CONTRAST TECHNIQUE: Multiplanar, multisequence MR imaging of the thoracic spine was performed. No intravenous contrast was administered. COMPARISON:  CT angio chest, abdomen and pelvis 01/18/2023. FINDINGS: Alignment: No significant listhesis is present. Mild rightward curvature is centered  at T8-9. Thoracic kyphosis is intact. Vertebrae: Multiple metastatic deposits are present throughout the thoracic spine. A 6 mm lesion is present within the right pedicle of T1. An 8 mm lesion is present posterior elements of T2. Anterior lesion at T2 measures 13 mm. Metastatic lesions are present within the T3 vertebral body and the spinous process of T3. A right superior lesion at T5 extends to the pedicle. Rounded metastases at T7 measure 10 and 11 mm. Larger metastases at T8 extend from the superior to inferior endplate, 18 mm. Similar larger lesions are present throughout the remainder of the lower thoracic spine. A right T11 lesion extends into the right pedicle. Diffuse metastatic infiltration is present in the T12 vertebral body. No pathologic fractures are present. Extensive infiltration of the L1 vertebral body is present. Cord:   Normal signal and morphology. Paraspinal and other soft tissues: See CT report from the same day for further description of metastatic disease outside the spine. Disc levels: A central disc protrusion at T8-9 effaces the ventral CSF. The foramina are patent bilaterally. No significant disc disease or stenosis is present above this level. T9-10: Negative. T10-11: Negative. T11-12: A rightward disc protrusion and annular tear is present without focal stenosis. T12-L1: Negative. IMPRESSION: 1. Multiple metastatic deposits throughout the thoracic spine as described. 2. No pathologic fractures. 3. Central disc protrusion at T8-9 effaces the ventral CSF. 4. Rightward disc protrusion and annular tear at T11-12 without focal stenosis. Electronically Signed   By: Lonni Necessary M.D.   On: 01/18/2023 19:20   CT Angio Chest/Abd/Pel for Dissection W and/or Wo Contrast Result Date: 01/18/2023 CLINICAL DATA:  89 year old male with pain, weakness. Known mild aneurysmal dilatation of the ascending thoracic aorta. Bladder cancer. EXAM: CT ANGIOGRAPHY CHEST, ABDOMEN AND PELVIS TECHNIQUE: Non-contrast CT of the chest was initially obtained. Multidetector CT imaging through the chest, abdomen and pelvis was performed using the standard protocol during bolus administration of intravenous contrast. Multiplanar reconstructed images and MIPs were obtained and reviewed to evaluate the vascular anatomy. RADIATION DOSE REDUCTION: This exam was performed according to the departmental dose-optimization program which includes automated exposure control, adjustment of the mA and/or kV according to patient size and/or use of iterative reconstruction technique. CONTRAST:  75mL OMNIPAQUE  IOHEXOL  350 MG/ML SOLN COMPARISON:  CTA chest 08/02/2022 and earlier. Restaging CT Abdomen and Pelvis 12/11/2022 FINDINGS: CTA CHEST FINDINGS Cardiovascular: Previous CABG. Extensive Calcified aortic atherosclerosis. Tortuous ascending aorta is stable,  smoothly tapers in the arch. Negative for thoracic aortic dissection, periaortic hematoma. Heart size is stable, within normal limits. No pericardial effusion. Little contrast in the pulmonary arteries. Proximal great vessel atherosclerosis appears stable since July. Mediastinum/Nodes: Negative for mediastinal hematoma, mass, lymphadenopathy. Lungs/Pleura: Stable lung volumes since July. Patchy lung base atelectasis or scarring has not significantly changed. No pleural effusion. Small volume new retained secretions in the trachea just below the thoracic inlet on series 4 image 30. But elsewhere the major airways are patent. No active pulmonary inflammation is identified. Musculoskeletal: Chronic sternotomy. Chronic right 2nd rib deformity. T1 through T11 thoracic vertebrae appear stable since July. T12 compression fracture with mild comminution is new since July but was present, subtle in November when other skeletal metastatic disease was suspected. Central T12 loss of vertebral body height up to 30% (series 6, image 105). Lucency within the T12 body appears progressed since July. No retropulsion. T12 pedicles and posterior elements appear intact and aligned. There is mild anterior and lateral prevertebral soft tissue or edema which is  increased since November on series 11, image 56. No epidural tumor by CT. Review of the MIP images confirms the above findings. CTA ABDOMEN AND PELVIS FINDINGS VASCULAR Aortoiliac calcified atherosclerosis. Extensive aortic and branch calcified atherosclerosis but the major arterial structures in the abdomen and pelvis remain patent. Negative for abdominal aortic dissection or aneurysm. Review of the MIP images confirms the above findings. NON-VASCULAR Hepatobiliary: Stable liver. Cholelithiasis. No gallbladder inflammation by CT. Pancreas: Diminutive. Spleen: Diminutive. Adrenals/Urinary Tract: Normal adrenal glands. Nonobstructed kidneys with symmetric renal enhancement. No  hydroureter. Abnormal prostate at the base of the bladder as below. Diminutive bladder similar to the November CT. Stomach/Bowel: Decompressed large bowel from the splenic flexure distally but transverse colon is redundant with moderate retained gas and stool, increased since November. Low-density retained stool in the right colon. Cecum is on a lax mesentery. No dilated small bowel. No large bowel inflammation identified. Stomach and duodenum appear negative. No free air or free fluid. Lymphatic: No lymphadenopathy identified in the abdomen or pelvis. Reproductive: Abnormally heterogeneous, lobulated, masslike appearance of the prostate on series 10, image 203 not significantly changed from the restaging CT last month (please see that report) Other: No pelvis free fluid. Musculoskeletal: Lumbar vertebrae appear stable since November. Medial left iliac sclerotic bone lesions have not significantly changed. Sacrum and SI joints appear intact. Pelvis and proximal femurs appear to remain intact. Review of the MIP images confirms the above findings. IMPRESSION: 1. Negative for Aortic Dissection. Stable mild fusiform aneurysmal enlargement of the ascending aorta. Severe Aortic Atherosclerosis (ICD10-I70.0). 2. Positive for a subacute and pathologic appearing T12 compression fracture, with underlying lytic lesion and loss of height progressed since November. No retropulsion or other complicating features. In conjunction with November CT findings of skeletal metastatic disease, abnormal bladder base and prostate as detailed on that exam. 3. No other acute finding in the Chest, Abdomen, or Pelvis. Cholelithiasis. Electronically Signed   By: VEAR Hurst M.D.   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   PLAN:Today, I talked to the patient and family about the findings and work-up thus far.  We discussed the  natural history of spinal metastases and general treatment, highlighting the role of radiotherapy in the management.  We discussed the available radiation techniques, and focused on the details of logistics and delivery.  We reviewed the anticipated acute and late sequelae associated with radiation in this setting.  The patient was encouraged to ask questions that I answered to the best of my ability.   The patient would like to proceed with radiation and will be scheduled for CT simulation to reduce pain and preserve spinal cord function.  His prognosis is good with the initiation of ADT and survival is likely to be measured in years.  I personally spent 60 minutes in this encounter including chart review, reviewing radiological studies, meeting face-to-face with the patient, entering orders and completing documentation.   ------------------------------------------------   Donnice Barge, MD [facility]  Radiation Oncology Medical Director and Director of Stereotactic Radiosurgery Direct Dial: [phone]  Fax: [phone] [email or web address]  Skype  LinkedIn

## 2023-01-27 NOTE — Progress Notes (Signed)
 William Faulkner   DOB:[birth year]   FM#:[id number]    ASSESSMENT & PLAN:  89 year old male with history of diabetes, CAD/CABG, hypertension, glaucoma presented with back pain found to have diffuse bony lesions with elevated PSA of 261. Biopsy confirmed prostate cancer. We discussed the results today. I had extensive conversation with patient, wife and son in the room. We discussed again that bone metastases means he has stage IV prostate cancer. Treatment is palliative and means not curable. They expressed understanding. Most patient with prostate cancer undergo modern treatment can live for years from that standpoint. Of course, sometimes we may die of some other unrelated conditions or unexpected causes at times. Prostate cancer patients also undergoing treatment may have higher risk of HTN, MI, CVA or CVD exacerbation. Other AEs may include fatigue, hot flashes, weakness, decreased bone density, diarrhea, nausea, long term risk of muscle fatigue and loss of muscle mass so exercise and physical activities are recommended. Loss of bone density and fractures from cancer can be treatment with bisphosphonate.  Discussed without treatment, expect hospice care at home. This mean no cancer directed therapy. Patient and family focus on comfort measure, QoL and control symptoms like pain from cancer. Life expectancy likely less than six months.   After discussion, patient expressed he wants to try to have treatment and not ready for hospice. Both wife and son agree with him as well.   Please continue bicalutamide  50 mg daily for now and on discharge until follow up in clinic. Will order apalutamide  to transition as outpatient Will start ADT as outpatient Start palliative radiation per Dr. Patrcia Adding dexamethasone  2mg  twice daily for pain control and try to avoid narcotics as able and wean off as outpatient  Discharge planning Pending auth for SNF. Will request outpatient follow up for him when ready and  cancel tomorrow's appointment  All questions were answered.    The total time spent in the appointment was 60 minutes encounter with patients including review of chart and various tests results, discussions about plan of care and coordination of care plan with patient, family, care team.  Thank you for the consult. Will follow with you. Please do not hesitate to call.  William [name], MD 01/27/2023 8:35 PM  Subjective:  Patient is with wife and son in the room. Report confusion from medication today, thought from tramadol . Family report he was awake, interactive, lucid, conversation yesterday. Report no therapy yesterday. He had ambulate today with one person.   Objective:  Vitals:   01/26/23 2135 01/27/23 0532  BP: (!) 158/72 (!) 166/51  Pulse: (!) 53 83  Resp:  18  Temp:  98.9 F (37.2 C)  SpO2: 98% 92%     Intake/Output Summary (Last 24 hours) at 01/27/2023 2035 Last data filed at 01/27/2023 1900 Gross per 24 hour  Intake 60 ml  Output 575 ml  Net -515 ml    GENERAL: alert, no distress and comfortable SKIN: skin color normal EYES: normal, sclera clear OROPHARYNX: moist, no exudate, no erythema.  NECK: supple, no palpable mass LYMPH:  no palpable lymphadenopathy LUNGS: No wheeze, rales and clear to auscultation bilaterally with normal breathing effort.  HEART: regular rate & rhythm  ABDOMEN: abdomen soft, non-tender and non-distended Musculoskeletal: no lower extremity edema NEURO: alert with fluent speech, no focal motor/sensory deficits Strength 5/5 in lower extremities   Labs:  Recent Labs    01/18/23 0822 01/20/23 0321 01/23/23 0356 01/24/23 0345 01/26/23 0414 01/27/23 0930  NA 139   < >  136 140 138 137  K 4.3   < > 3.7 3.9 4.2 4.4  CL 105   < > 107 109 104 100  CO2 21*   < > 20* 22 26 26   GLUCOSE 128*   < > 99 107* 115* 136*  BUN 38*   < > 42* 34* 32* 25*  CREATININE 1.47*   < > 1.28* 1.08 1.16 1.12  CALCIUM  10.0   < > 9.0 9.5 9.3 10.1  GFRNONAA 46*   <  > 55* >60 >60 >60  PROT 7.6  --   --   --   --  7.9  ALBUMIN 3.6   < > 2.7* 2.8*  --  3.5  AST 22  --   --   --   --  24  ALT 16  --   --   --   --  27  ALKPHOS 85  --   --   --   --  100  BILITOT 0.9  --   --   --   --  0.8   < > = values in this interval not displayed.    Studies:  DG Chest 2 View Result Date: 01/23/2023 CLINICAL DATA:  Follow-up pneumonia EXAM: CHEST - 2 VIEW COMPARISON:  01/18/2023 CT FINDINGS: Cardiac shadow is enlarged. Postsurgical changes and aortic calcifications are again noted. Lungs are well aerated bilaterally. Minimal left basilar atelectasis is seen. No bony abnormality is noted. IMPRESSION: Minimal left basilar atelectasis. Electronically Signed   By: William Faulkner M.D.   On: 01/23/2023 19:19   IR Fluoro Guide Ndl Plmt / BX Result Date: 01/21/2023 INDICATION: Pathologic fracture. EXAM: FLUOROSCOPY GUIDED T12 CORE BONE BIOPSY MEDICATIONS: None. ANESTHESIA/SEDATION: Moderate (conscious) sedation was employed during this procedure. A total of Versed  1.5 mg and Fentanyl  75 mcg was administered intravenously by the radiology nurse. Total intra-service moderate Sedation Time: 29 minutes. The patient's level of consciousness and vital signs were monitored continuously by radiology nursing throughout the procedure under my direct supervision. Fluoroscopy does: 657 mGy air Kerma. COMPLICATIONS: None immediate. PROCEDURE: Informed written consent was obtained from the patient after a thorough discussion of the procedural risks, benefits and alternatives. All questions were addressed. Maximal Sterile Barrier Technique was utilized including caps, mask, sterile gowns, sterile gloves, sterile drape, hand hygiene and skin antiseptic. A timeout was performed prior to the initiation of the procedure. The patient was placed in prone position on the angiography table. The thoracic spine region was prepped and draped in a sterile fashion. Under fluoroscopy, the T12 vertebral body was  delineated and the skin area was marked. The skin was infiltrated with a 1% Lidocaine  approximately 3 cm lateral to the spinous process projection on the right. Using a 22-gauge spinal needle, the soft issue and the peripedicular space and periosteum were infiltrated with Bupivacaine  0.5%. A skin incision was made at the access site. Subsequently, an 11-gauge Kyphon trocar was inserted under fluoroscopic guidance until contact with the pedicle was obtained. The trocar was inserted under light hammer tapping into the pedicle until the posterior boundaries of the vertebral body was reached. The diamond mandrill was removed and 3 core bone biopsy samples were obtained. The trocar was later removed. The access site was cleaned and covered with a sterile bandage. IMPRESSION: Fluoroscopy guided T12 core bone biopsy performed using tiny fragments on each core biopsy pass. Samples obtained were sent for tissue exam. Electronically Signed   By: Katyucia  de Macedo Rodrigues M.D.  On: 01/21/2023 12:46   DG Bone Survey Met Result Date: 01/20/2023 CLINICAL DATA:  Lytic bone lesions on x-ray. Multiple metastatic deposits throughout the thoracic spine. EXAM: METASTATIC BONE SURVEY COMPARISON:  Thoracic spine MRI 01/18/2023. Chest abdomen pelvis CT 01/18/2023. FINDINGS: Patient with known lesions in the thoracic and lumbar spine. This was better assessed on recent CT and MRI. The majority of these lesions are not well-defined by radiograph. The known T12 compression fracture is not well assessed on the current exam due to osseous overlap. The known left iliac sclerotic lesion is not well seen by radiograph. Area of sclerosis projects over the lateral aspect of the right distal tibia at the ankle joint. Other there are extremity lesion is seen by radiograph. Prior median sternotomy. Blunting of the left costophrenic angle, likely epicardial fat pad and atelectasis. Aortic atherosclerosis and vascular calcifications.  IMPRESSION: 1. Patient with known spinal osseous bone lesions. This was better assessed on recent CT and MRI. The majority of these lesions are not well-defined by radiograph. 2. The known T12 compression fracture is not well assessed on the current exam due to osseous overlap. 3. The known left iliac sclerotic lesion is not well seen by radiograph. 4. Area of sclerosis projects over the lateral aspect of the right distal tibia at the ankle joint, indeterminate. Electronically Signed   By: Andrea Gasman M.D.   On: 01/20/2023 22:50   US  RENAL Result Date: 01/20/2023 CLINICAL DATA:  Acute kidney injury EXAM: RENAL / URINARY TRACT ULTRASOUND COMPLETE COMPARISON:  CT 01/18/2023 FINDINGS: Right Kidney: Renal measurements: 9.1 x 4.7 x 4.4 cm = volume: 9.5 mL. Echogenicity within normal limits. No mass or hydronephrosis visualized. Left Kidney: Renal measurements: 11.9 x 5.3 x 5 cm = volume: 165 mL. Echogenicity within normal limits. No mass or hydronephrosis visualized. Bladder: Appears normal for degree of bladder distention. Other: Prostate enlargement at least 5.7 x 5.3 cm. IMPRESSION: 1. Negative for hydronephrosis. 2. Prostate enlargement. Electronically Signed   By: JONETTA Faes M.D.   On: 01/20/2023 16:00   MR THORACIC SPINE WO CONTRAST Result Date: 01/18/2023 CLINICAL DATA:  Bladder cancer. Evaluate for metastatic disease to the thoracic spine. Chronic back pain. Weakness beginning approximately 1 month ago. EXAM: MRI THORACIC SPINE WITHOUT CONTRAST TECHNIQUE: Multiplanar, multisequence MR imaging of the thoracic spine was performed. No intravenous contrast was administered. COMPARISON:  CT angio chest, abdomen and pelvis 01/18/2023. FINDINGS: Alignment: No significant listhesis is present. Mild rightward curvature is centered at T8-9. Thoracic kyphosis is intact. Vertebrae: Multiple metastatic deposits are present throughout the thoracic spine. A 6 mm lesion is present within the right pedicle of T1. An  8 mm lesion is present posterior elements of T2. Anterior lesion at T2 measures 13 mm. Metastatic lesions are present within the T3 vertebral body and the spinous process of T3. A right superior lesion at T5 extends to the pedicle. Rounded metastases at T7 measure 10 and 11 mm. Larger metastases at T8 extend from the superior to inferior endplate, 18 mm. Similar larger lesions are present throughout the remainder of the lower thoracic spine. A right T11 lesion extends into the right pedicle. Diffuse metastatic infiltration is present in the T12 vertebral body. No pathologic fractures are present. Extensive infiltration of the L1 vertebral body is present. Cord:  Normal signal and morphology. Paraspinal and other soft tissues: See CT report from the same day for further description of metastatic disease outside the spine. Disc levels: A central disc protrusion at T8-9 effaces  the ventral CSF. The foramina are patent bilaterally. No significant disc disease or stenosis is present above this level. T9-10: Negative. T10-11: Negative. T11-12: A rightward disc protrusion and annular tear is present without focal stenosis. T12-L1: Negative. IMPRESSION: 1. Multiple metastatic deposits throughout the thoracic spine as described. 2. No pathologic fractures. 3. Central disc protrusion at T8-9 effaces the ventral CSF. 4. Rightward disc protrusion and annular tear at T11-12 without focal stenosis. Electronically Signed   By: Lonni Necessary M.D.   On: 01/18/2023 19:20   CT Angio Chest/Abd/Pel for Dissection W and/or Wo Contrast Result Date: 01/18/2023 CLINICAL DATA:  89 year old male with pain, weakness. Known mild aneurysmal dilatation of the ascending thoracic aorta. Bladder cancer. EXAM: CT ANGIOGRAPHY CHEST, ABDOMEN AND PELVIS TECHNIQUE: Non-contrast CT of the chest was initially obtained. Multidetector CT imaging through the chest, abdomen and pelvis was performed using the standard protocol during bolus  administration of intravenous contrast. Multiplanar reconstructed images and MIPs were obtained and reviewed to evaluate the vascular anatomy. RADIATION DOSE REDUCTION: This exam was performed according to the departmental dose-optimization program which includes automated exposure control, adjustment of the mA and/or kV according to patient size and/or use of iterative reconstruction technique. CONTRAST:  75mL OMNIPAQUE  IOHEXOL  350 MG/ML SOLN COMPARISON:  CTA chest 08/02/2022 and earlier. Restaging CT Abdomen and Pelvis 12/11/2022 FINDINGS: CTA CHEST FINDINGS Cardiovascular: Previous CABG. Extensive Calcified aortic atherosclerosis. Tortuous ascending aorta is stable, smoothly tapers in the arch. Negative for thoracic aortic dissection, periaortic hematoma. Heart size is stable, within normal limits. No pericardial effusion. Little contrast in the pulmonary arteries. Proximal great vessel atherosclerosis appears stable since July. Mediastinum/Nodes: Negative for mediastinal hematoma, mass, lymphadenopathy. Lungs/Pleura: Stable lung volumes since July. Patchy lung base atelectasis or scarring has not significantly changed. No pleural effusion. Small volume new retained secretions in the trachea just below the thoracic inlet on series 4 image 30. But elsewhere the major airways are patent. No active pulmonary inflammation is identified. Musculoskeletal: Chronic sternotomy. Chronic right 2nd rib deformity. T1 through T11 thoracic vertebrae appear stable since July. T12 compression fracture with mild comminution is new since July but was present, subtle in November when other skeletal metastatic disease was suspected. Central T12 loss of vertebral body height up to 30% (series 6, image 105). Lucency within the T12 body appears progressed since July. No retropulsion. T12 pedicles and posterior elements appear intact and aligned. There is mild anterior and lateral prevertebral soft tissue or edema which is increased  since November on series 11, image 56. No epidural tumor by CT. Review of the MIP images confirms the above findings. CTA ABDOMEN AND PELVIS FINDINGS VASCULAR Aortoiliac calcified atherosclerosis. Extensive aortic and branch calcified atherosclerosis but the major arterial structures in the abdomen and pelvis remain patent. Negative for abdominal aortic dissection or aneurysm. Review of the MIP images confirms the above findings. NON-VASCULAR Hepatobiliary: Stable liver. Cholelithiasis. No gallbladder inflammation by CT. Pancreas: Diminutive. Spleen: Diminutive. Adrenals/Urinary Tract: Normal adrenal glands. Nonobstructed kidneys with symmetric renal enhancement. No hydroureter. Abnormal prostate at the base of the bladder as below. Diminutive bladder similar to the November CT. Stomach/Bowel: Decompressed large bowel from the splenic flexure distally but transverse colon is redundant with moderate retained gas and stool, increased since November. Low-density retained stool in the right colon. Cecum is on a lax mesentery. No dilated small bowel. No large bowel inflammation identified. Stomach and duodenum appear negative. No free air or free fluid. Lymphatic: No lymphadenopathy identified in the abdomen or pelvis.  Reproductive: Abnormally heterogeneous, lobulated, masslike appearance of the prostate on series 10, image 203 not significantly changed from the restaging CT last month (please see that report) Other: No pelvis free fluid. Musculoskeletal: Lumbar vertebrae appear stable since November. Medial left iliac sclerotic bone lesions have not significantly changed. Sacrum and SI joints appear intact. Pelvis and proximal femurs appear to remain intact. Review of the MIP images confirms the above findings. IMPRESSION: 1. Negative for Aortic Dissection. Stable mild fusiform aneurysmal enlargement of the ascending aorta. Severe Aortic Atherosclerosis (ICD10-I70.0). 2. Positive for a subacute and pathologic appearing  T12 compression fracture, with underlying lytic lesion and loss of height progressed since November. No retropulsion or other complicating features. In conjunction with November CT findings of skeletal metastatic disease, abnormal bladder base and prostate as detailed on that exam. 3. No other acute finding in the Chest, Abdomen, or Pelvis. Cholelithiasis. Electronically Signed   By: VEAR Hurst M.D.   On: 01/18/2023 10:37

## 2023-01-27 NOTE — Progress Notes (Signed)
 Daily Progress Note   Patient Name: William Faulkner       Date: 01/27/2023 DOB: 1934-07-04  Age: 89 y.o. MRN#: [id number] Attending Physician: Davia Nydia POUR, MD Primary Care Physician: Amon Aloysius BRAVO, MD Admit Date: 01/18/2023 Length of Stay: 7 days  Reason for Consultation/Follow-up: Establishing goals of care  Subjective:   CC: Patient awaiting further discussion with oncology. Follow up regarding complex medical decision making.   Subjective:  Extensive review of EMR prior to presenting to bedside.  At time of EMR review in past 24 hours patient has received tramadol  50 mg x 1 dose.  Discontinued this due to adverse effects that can cause.  Oxycodone  was discontinued and replaced with tramadol  over the weekend.  Patient has not received a dose of oxycodone  since 1/2. Discussed care with hospitalist, oncologist, and RN prior to seeing patient.  When initially presenting to go to bedside, RN on phone with wife.  This provider was able to speak with wife to again discuss goals for medical care.  Wife and son are planning to come of this afternoon after 3:00 to have further discussions with oncologist as still need to review biopsy results to determine further cancer directed therapies.  Acknowledged this.  Spent time again reviewing pathways for medical care moving forward depending on cancer directed therapies.  Discussed minimizing opioid medications.  Discussed delirium and how to minimize effects from this.  Answered all questions as able.  Wife voiced appreciation for conversation today.  Presented to bedside to meet with patient.  RN present at bedside as well.  Introduced myself as a member of the palliative medicine team.  Spent time again reviewing patient's medical course.  Patient awaiting discussion with oncologist regarding biopsy results.  Patient feeling frustrated about sitting here doing nothing.  Reviewed how biopsy results take time.  Discussed that based on these biopsy  results, cancer directed therapies can be undertaken.  Believe currently patient has metastatic prostate cancer.  Discussed oncologist also reaching out to radiation oncologist regarding possible intervention for pain management.  Again discussed metastatic cancer remains medications aimed at cancer would be to minimize progression and keep things at bay.  Discussed if prostate cancer, there are more therapies available which can allow for longer periods of time.  Patient still frustrated wanting to know what his quality of life will be.  Spent time discussing how functional status can greatly affect quality time and prognosis.  Encouraged patient to have further discussions with oncologist and his family this afternoon about what is most important to him and how he wants to spend his time.  Discussed patient currently on aggressive pathway for medical care.  Patient can voice at any time that he no longer wants to receive aggressive medical therapies and can transition to focusing on comfort and quality of life at home.  Patient acknowledges this.  With permission, also able to discuss status with patient.  Again explained full code versus DNR.  Patient admits it is difficult for him to change his CODE STATUS to DNR due to the finality of it.  And time explaining that DNR CODE STATUS does not mean that you will not continue to get appropriate care.  Explained it means that should his heart stop or should he stop breathing, patient would not receive cardiac resuscitation or put on life support through mechanical ventilation.  Discussed this is an important topic he needs to talk to his wife about.  If patient were to go on  life support, his family would be the one making decisions about management while on it including when to stop it.  Patient acknowledged this and agreed with important conversations with his wife.  Spent time providing emotional support via active listening.  Patient and his wife have been  together for 65 years.  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  While patient hopes for more time making memories and having quality of life, patient acknowledges how well things have gone until now.  Hoped patient could have many more good memories with his family and loved ones.  All questions answered at that time.  Noted palliative medicine team continue to follow along with patient's medical journey.   Objective:   Vital Signs:  BP (!) 166/51 (BP Location: Left Arm)   Pulse 83   Temp 98.9 F (37.2 C) (Oral)   Resp 18   Ht 6' 2 (1.88 m)   Wt 83.5 kg   SpO2 92%   BMI 23.62 kg/m   Physical Exam: General: NAD, alert, sitting up in chair at bedside, chronically ill appearing Cardiovascular: RRR Respiratory: no increased work of breathing noted, not in respiratory distress Skin: no rashes or lesions on visible skin Neuro: A&Ox4, following commands easily Psych: appropriately answers all questions  Imaging:  I personally reviewed recent imaging.   Assessment & Plan:   Assessment: Patient is a 64-year-old male with past medical history of aortic aneurysm, aortic insufficiency, BPH, CAD with bypass, remote history of bladder cancer, hypertension, and hyperlipidemia who was admitted on 01/18/2023 for management of weakness.  During hospitalization, patient has received management based on imaging which showed multiple metastatic lesions in spine.  Patient underwent biopsy on 01/21/2023 for analysis.  Oncology following.  Patient started on therapies in the prostate cancer.  Palliative medicine team consulted to assist with complex medical decision making.  Recommendations/Plan: # Complex medical decision making/goals of care:  - Extensive discussion with patient in person and wife over phone as detailed above.  Discussed possible pathways for medical care moving forward.  Patient notes being  frustrated with prolonged hospitalization.  Patient and family planning to meet with oncology to further discuss possible cancer directed therapies; hopeful for discussion this afternoon.  Have informed family and oncologist.  Discussed with patient that he can always decide to stop cancer directed therapies if he feels they are not improving his quality of life overall.  Spent time explaining that prognostically patient's functional status also affects his outcome so that is why rehab has been encouraged.  Discussed with prostate cancer patient could have lots more time if his metastatic disease could be contained.  Awaiting discussion with oncology.  -Discussed with patient importance of talking to family about what he finds most important to his quality of life.  Did discuss CODE STATUS with patient.  Patient notes having difficulty changing his CODE STATUS to DNR due to the finality of it.  Patient appropriately struggling with thoughts that occur when facing ones mortality.  -  Code Status: Full Code  # Symptom management:  -Pain, acute on chronic in setting of pathologic fractures from metastatic cancer lesions   -Discontinue tramadol .  Do not restart in geriatric population due to adverse effects.   -Family reports confusion with oxycodone  so this has been discontinued.   -Recommend minimizing any opioid medications at this time.   -Continue Tylenol  1000 mg every 8 hours scheduled   -  Consider ibuprofen as needed for breakthrough pain since no signs of bleeding or kidney dysfunction   -Could consider steroids if oncology agrees   -Agree with involving radiation oncology to assist  # Psychosocial Support:  -Wife, son, 2 daughters  # Discharge Planning: Skilled Nursing Facility for rehab with Palliative care service follow-up -Already placed referral for patient to follow-up with PMT at  [facility]  Discussed with: Patient, patient's wife, hospitalist, RN, oncologist  Thank you for allowing  the palliative care team to participate in the care Lisa J Milhorn.  Tinnie Radar, DO Palliative Care Provider PMT # [phone]  If patient remains symptomatic despite maximum doses, please call PMT at [phone] between 0700 and 1900. Outside of these hours, please call attending, as PMT does not have night coverage.  Personally spent 52 minutes in patient care including extensive chart review (labs, imaging, progress/consult notes, vital signs), medically appropraite exam, discussed with treatment team, education to patient, family, and staff, documenting clinical information, medication review and management, coordination of care, and available advanced directive documents.   *Please note that this is a verbal dictation therefore any spelling or grammatical errors are due to the Dragon Medical One system interpretation.

## 2023-01-27 NOTE — Progress Notes (Signed)
 This nurse spoke with the patient's wife this AM to discuss that the patient will be kept in the hospital for one more day to discuss plan of care and how we can better control pt pain management.

## 2023-01-27 NOTE — Progress Notes (Signed)
 Path reivewed  A. BONE, T12, BIOPSY:  - Metastatic carcinoma to bone, consistent with a primary prostatic  Carcinoma  Discussed with patient and family, would like to pursue prostate cancer treatment.  Will start apalutamide  180 mg daily. Continue bicalutamide  until obtained apalutamide  and switch.  Order ADT to be started at the end of the month.

## 2023-01-27 NOTE — Progress Notes (Signed)
 Patient OOB, brace placed on patient per Dr. Isidoro Donning verbal order for patient to wear back brace when OOB and walking.

## 2023-01-27 NOTE — TOC Progression Note (Signed)
 Transition of Care [facility]) - Progression Note    Patient Details  Name: [name] MRN: [id number] Date of Birth: 09-07-1934  [facility] CM/SW Contact  Alfonse JONELLE Rex, RN Phone Number: 01/27/2023, 8:47 AM  Clinical Narrative:  Per Lexine barrows still pending, TOC will continue to follow.     Expected Discharge Plan: Skilled Nursing Facility Barriers to Discharge: SNF Pending bed offer, Continued Medical Work up, English As A Second Language Teacher  Expected Discharge Plan and Services In-house Referral: Clinical Social Work   Post Acute Care Choice: Home Health Living arrangements for the past 2 months: Single Family Home Expected Discharge Date: 01/23/23               DME Arranged: N/A DME Agency: NA       HH Arranged: PT, OT HH Agency: [facility] Date [facility] Agency Contacted: 01/20/23 Time HH Agency Contacted: 1355 Representative spoke with at [facility] Agency: Cindie   Social Determinants of Health (SDOH) Interventions SDOH Screenings   Food Insecurity: No Food Insecurity (01/18/2023)  Housing: Low Risk  (01/18/2023)  Transportation Needs: No Transportation Needs (01/18/2023)  Utilities: Not At Risk (01/18/2023)  Depression (PHQ2-9): Low Risk  (09/18/2022)  Social Connections: Moderately Isolated (01/20/2023)  Tobacco Use: Medium Risk (01/18/2023)    Readmission Risk Interventions     No data to display

## 2023-01-27 NOTE — Progress Notes (Signed)
 [facility]                                                                              [name], is a 89 y.o. male, DOB - 08-18-1934, FMW:[id number] Admit date - 01/18/2023    Outpatient Primary MD for the patient is William Aloysius BRAVO, MD  LOS - 7  days  Chief Complaint  Patient presents with   Back Pain   Nausea   Weakness       Brief summary   Patient is a 89 year old male with CAD/CABG, thoracic cord aneurysm, aortic insufficiency, CKD-3A, CVA, HTN, HLD, bladder cancer and recent diagnosis of vertebral metastasis for which she is followed by orthopedic surgery, presenting with progressive back pain for about 2 months.  No report of trauma or fall.  In ED, CT angio chest, abdomen and pelvis raised concern for subacute and pathologic T12 compression fracture without retropulsion or other complicating features, underlying lytic lesion and progressive loss of height, abnormal bladder base and prostate .  [facility], William Faulkner consulted by EDP.  MRI thoracic spine obtained and showed multiple metastatic deposits throughout the thoracic spine, central disc protrusion at T8-9 without canal or foraminal stenosis, rightward disc protrusion and annular tear at T11-12 without focal stenosis but no pathologic fractures.  Oncology, urology and palliative consulted per recommendation by Ortho.  Patient underwent IR bone biopsy on 12/31.  Started on Casodex  for presumed prostate cancer.   Patient with AKI likely due to poor p.o. intake, ACE inhibitors and thiazide.  AKI resolved with fluid and holding nephrotoxic meds.     Assessment & Plan    Principal Problem:  Acute on chronic back pain: - CTA with subacute and pathologic appearing T12 compression fracture with underlying lytic lesion and loss of height progressed.  -MRI T-spine however showed no pathology, fractures, multiple metastatic deposits throughout the thoracic spine, central disc protrusion at T8-9 and  rightward disc fusion at T11-12 without focal stenosis.   -S/p IR bone biopsy on 12/31.  Pathology pending. -Started on Casodex  for presumed prostate cancer by oncology, will continue. -Over the weekend, patient had reported more confusion and somnolence, family requested discontinuation of oxycodone .  Started on tramadol  however had dizziness with it -This morning, patient continues to report back pain 8-9/10, interfering with mobility and gait.  Patient has been seen by orthospine, William Faulkner and had recommended TLSO brace with ambulation or sitting up.  -Given difficulty with pain control, requested palliative medicine assistance, may benefit from steroids or palliative XRT.    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  No pathologic fracture per MRI.  Unknown primary.  -  Patient has history of bladder cancer.  CT shows abnormal bladder base and prostate. -Urology, oncology consulted, s/p IR bone biopsy.  Biopsy result still pending -Patient had questions regarding the diagnosis, biopsy results, prognosis and life expectancy.  Tried to address code status however he wanted to discuss with his wife first.  Updated patient's oncologist, William Faulkner and requested evaluation for discussion with patient/family.   Acute kidney injury on  CKD-3A:  Likely due to poor p.o. intake, ACE inhibitor's and HCTZ.  Baseline creatinine has been 1.3-1.5 - Renal US  without acute finding but enlarged prostate.  AKI improved with IV fluid. -Creatinine plateaued at 2.44 on 12/30, resolved -Continue holding ACE, HCTZ  Acute metabolic encephalopathy: - Likely due to dehydration and gabapentin , narcotics -Fairly alert and oriented, responding appropriately to all the questions    DM-2 with hyperglycemia, neuropathy, hyperlipidemia and CKD-3A: -  A1c 6.5% on 8/28. -Continue sliding scale insulin   Recent Labs    01/26/23 2128  01/27/23 0744 01/27/23 1141  GLUCAP 148* 122* 131*      History of CAD/CABG:  -No acute issues, continue aspirin , propranolol     Essential hypertension/sinus bradycardia:  -Bradycardia resolved.  -Continue holding Lotensin , HCTZ, hydralazine . -Continue propranolol    Benign essential tremor -Continue home propranolol .   Glaucoma -Continue timolol  drops. -Follow-up with ophthalmology as an outpatient.   Physical deconditioning -PT recommended SNF  Estimated body mass index is 23.62 kg/m as calculated from the following:   Height as of this encounter: 6' 2 (1.88 m).   Weight as of this encounter: 83.5 kg.  Code Status: Full code DVT Prophylaxis:  enoxaparin  (LOVENOX ) injection 40 mg Start: 01/22/23 1200   Level of Care: Level of care: Med-Surg Family Communication: Updated patient Disposition Plan:      Remains inpatient appropriate:  Procedures:    Consultants:   Oncology Orthopedics Radiation oncology  Antimicrobials:   Anti-infectives (From admission, onward)    None          Medications  acetaminophen   1,000 mg Oral Q8H   aspirin  EC  81 mg Oral Daily   bicalutamide   50 mg Oral Daily   cholecalciferol   1,000 Units Oral Daily   enoxaparin  (LOVENOX ) injection  40 mg Subcutaneous Q24H   feeding supplement  1 Container Oral TID BM   feeding supplement  237 mL Oral BID BM   gabapentin   300 mg Oral BID   guaiFENesin   600 mg Oral BID   insulin  aspart  0-9 Units Subcutaneous TID WC   lidocaine   1 patch Transdermal Q24H   multivitamin with minerals  1 tablet Oral Daily   nystatin    Topical TID   polyethylene glycol  17 g Oral Daily   propranolol   40 mg Oral Daily   rosuvastatin   20 mg Oral Daily   timolol   1 drop Both Eyes q morning      Subjective:   William Faulkner was seen and examined today.  Continues to have back pain, 8-9/10, states he feels confused.  Yesterday was placed on tramadol  however felt dizzy in the evening.   No nausea  vomiting, chest pain, shortness of breath, fevers.  Objective:   Vitals:   01/26/23 2126 01/26/23 2132 01/26/23 2135 01/27/23 0532  BP: (!) 174/60 (!) 177/64 (!) 158/72 (!) 166/51  Pulse: 63 70 (!) 53 83  Resp: 18   18  Temp: 98.1 F (36.7 C)   98.9 F (37.2 C)  TempSrc: Oral   Oral  SpO2: 94% 97% 98% 92%  Weight:      Height:        Intake/Output Summary (Last 24 hours) at 01/27/2023 1247 Last data filed at 01/27/2023 0926 Gross per 24 hour  Intake 420 ml  Output 800 ml  Net -380 ml     Wt Readings from Last 3 Encounters:  01/18/23 83.5 kg  09/18/22 90.8 kg  01/08/22 92.5 kg    Physical  Exam General: Alert and oriented x 3, NAD Cardiovascular: S1 S2 clear, RRR.  Respiratory: CTAB Gastrointestinal: Soft, nontender, nondistended, NBS Ext: no pedal edema bilaterally Neuro: no new FND's  Psych: Normal affect    Data Reviewed:  I have personally reviewed following labs    CBC Lab Results  Component Value Date   WBC 6.8 01/27/2023   RBC 5.10 01/27/2023   HGB 14.7 01/27/2023   HCT 45.8 01/27/2023   MCV 89.8 01/27/2023   MCH 28.8 01/27/2023   PLT 456 (H) 01/27/2023   MCHC 32.1 01/27/2023   RDW 13.1 01/27/2023   LYMPHSABS 1.1 01/18/2023   MONOABS 0.5 01/18/2023   EOSABS 0.1 01/18/2023   BASOSABS 0.0 01/18/2023     Last metabolic panel Lab Results  Component Value Date   NA 137 01/27/2023   K 4.4 01/27/2023   CL 100 01/27/2023   CO2 26 01/27/2023   BUN 25 (H) 01/27/2023   CREATININE 1.12 01/27/2023   GLUCOSE 136 (H) 01/27/2023   GFRNONAA >60 01/27/2023   GFRAA 58 (L) 12/01/2019   CALCIUM  10.1 01/27/2023   PHOS 2.6 01/24/2023   PROT 7.9 01/27/2023   ALBUMIN 3.5 01/27/2023   LABGLOB 2.6 01/20/2023   AGRATIO 1.0 01/20/2023   BILITOT 0.8 01/27/2023   ALKPHOS 100 01/27/2023   AST 24 01/27/2023   ALT 27 01/27/2023   ANIONGAP 11 01/27/2023    CBG (last 3)  Recent Labs    01/26/23 2128 01/27/23 0744 01/27/23 1141  GLUCAP 148* 122* 131*       Coagulation Profile: Recent Labs  Lab 01/20/23 1925  INR 1.1     Radiology Studies: I have personally reviewed the imaging studies  No results found.      Nydia Distance M.D. [facility] 01/27/2023, 12:47 PM  Available via Epic secure chat 7am-7pm After 7 pm, please refer to night coverage provider listed on amion.

## 2023-01-27 NOTE — Progress Notes (Signed)
  Radiation Oncology         [phone] ________________________________  Name: William Faulkner MRN: [id number]  Date: 01/27/2023  DOB: 21-Jan-1935  SIMULATION AND TREATMENT PLANNING NOTE    ICD-10-CM   1. Metastasis to bone [facility])  C79.51       DIAGNOSIS:  89 y.o. patient with T12 painful spinal metastasis from newly diagnosed ADT-Naive, Castration Sensitive Metastatic Prostate Cancer   NARRATIVE:  The patient was brought to the CT Simulation planning suite.  Identity was confirmed.  All relevant records and images related to the planned course of therapy were reviewed.  The patient freely provided informed written consent to proceed with treatment after reviewing the details related to the planned course of therapy. The consent form was witnessed and verified by the simulation staff.  Then, the patient was set-up in a stable reproducible  supine position for radiation therapy.  CT images were obtained.  Surface markings were placed.  The CT images were loaded into the planning software.  Then the target and avoidance structures were contoured including kidneys.  Treatment planning then occurred.  The radiation prescription was entered and confirmed.  Then, I designed and supervised the construction of multiple medically necessary complex treatment devices with VacLoc positioner and 2 MLCs to shield kidneys.  I have requested : 3D Simulation  I have requested a DVH of the following structures: Left Kidney, Right Kidney and target.  PLAN:  The patient will receive 30 Gy in 10 fractions.  ________________________________  William Faulkner, M.D.

## 2023-01-27 NOTE — Progress Notes (Signed)
  Radiation Oncology         [phone] ________________________________  Name: William Faulkner MRN: [id number]  Date: 01/27/2023  DOB: November 15, 1934  Chart Note:  Biopsy confirms prostate cancer.  In terms of potential malignancies, prostate cancer carries a relatively favorable prognosis.  Newly diagnosed Stage IV prostate cancer carries a 5- year survival of 36% with a median survival between 2 and 3 years.*  His oncology treatment strategy will be palliative to optimize quality of life and prolong duration of good quality years with hormone therapy.  I discussed this with him in positive terms.  Although, I also realistically pointed out that this new diagnosis is superimposed on his advanced age of 49 with multiple other medical issues.  Radiation carries 80-90% likelihood of reducing his presenting pain and preserving spinal cord function with a goal to help him return to living independently.**  ________________________________  William Faulkner, M.D.    References:  * [email or web address]  ** [email or web address]

## 2023-01-27 NOTE — Progress Notes (Signed)
 Physical Therapy Treatment Patient Details Name: William Faulkner MRN: [id number] DOB: 07-Dec-1934 Today's Date: 01/27/2023   History of Present Illness 89 y.o. male with medical history significant of benign essential tremor, BPH, cholelithiasis, type 2 diabetes, CAD/CABG, aortic aneurysm, arctic insufficiency, hyperlipidemia, hypertension, left rotator cuff tear, diabetic peripheral neuropathy bladder cancer, cerebrovascular disease, stage 3a CKD who presented with history of 2 months of back pain, nausea and weakness. Admitted on 01/18/23 with complaints of increased back pain. CT: Positive for subacute and pathologic appearing T12 compression fracture, with underlying lytic lesion and loss of height progressed since November.   MRI T-spine however showed no pathology, fractures, multiple metastatic deposits throughout the thoracic spine, central disc protrusion at T8-9 and rightward disc fusion at T11-12 without focal stenosis.    PT Comments  Pt progressing steadily this session. Agreeable to work with PT with encouragement. Noted improvement L foot clearance during gait today, distance to tolerance. Pt requested return to bed stating sitting in chair was not comfortable. D/c plan remains appropriate. Continue PT in acute setting.   If plan is discharge home, recommend the following: Assistance with cooking/housework;Assist for transportation;Help with stairs or ramp for entrance;A little help with walking and/or transfers;A lot of help with bathing/dressing/bathroom   Can travel by private vehicle     No  Equipment Recommendations  Rolling walker (2 wheels)    Recommendations for Other Services       Precautions / Restrictions Precautions Precautions: Back Precaution Booklet Issued: No Required Braces or Orthoses: Spinal Brace Spinal Brace: Thoracolumbosacral orthotic;Applied in sitting position Restrictions Weight Bearing Restrictions Per Provider Order: No Other Position/Activity  Restrictions: spinal precautions     Mobility  Bed Mobility   Bed Mobility: Rolling, Sidelying to Sit, Sit to Sidelying Rolling: Contact guard assist, Min assist Sidelying to sit: Min assist     Sit to sidelying: Min assist General bed mobility comments: rolls to L x2. cues for log roll, assist to elevate trunk/LEs on to bed, incr time; use of bed rail    Transfers Overall transfer level: Needs assistance Equipment used: Rolling walker (2 wheels) Transfers: Sit to/from Stand Sit to Stand: From elevated surface, Contact guard assist           General transfer comment: cues for hand placement and to power up with LEs. STS x2. CGA for safety    Ambulation/Gait Ambulation/Gait assistance: Contact guard assist, Supervision Gait Distance (Feet): 85 Feet Assistive device: Rolling walker (2 wheels) Gait Pattern/deviations: Decreased step length - left, Decreased dorsiflexion - left, Knee flexed in stance - right, Knee flexed in stance - left, Trunk flexed, Shuffle Gait velocity: decreased     General Gait Details: cues for posture-trunk and cervical extension, position from RW and safety. pt demonstrating improved L foot clearance during gait today   Stairs             Wheelchair Mobility     Tilt Bed    Modified Rankin (Stroke Patients Only)       Balance           Standing balance support: Bilateral upper extremity supported, During functional activity, Reliant on assistive device for balance Standing balance-Leahy Scale: Poor                              Cognition Arousal: Alert Behavior During Therapy: WFL for tasks assessed/performed Overall Cognitive Status: Within Functional Limits for tasks assessed  Exercises      General Comments        Pertinent Vitals/Pain Pain Assessment Pain Assessment: 0-10 Pain Location: back Pain Descriptors / Indicators: Aching, Sore,  Grimacing Pain Intervention(s): Limited activity within patient's tolerance, Monitored during session, Premedicated before session    Home Living                          Prior Function            PT Goals (current goals can now be found in the care plan section) Acute Rehab PT Goals Patient Stated Goal: Regain IND and return home PT Goal Formulation: With patient Time For Goal Achievement: 02/02/23 Potential to Achieve Goals: Good Progress towards PT goals: Progressing toward goals    Frequency    Min 1X/week      PT Plan      Co-evaluation              AM-PAC PT 6 Clicks Mobility   Outcome Measure  Help needed turning from your back to your side while in a flat bed without using bedrails?: A Little Help needed moving from lying on your back to sitting on the side of a flat bed without using bedrails?: A Little Help needed moving to and from a bed to a chair (including a wheelchair)?: A Little Help needed standing up from a chair using your arms (e.g., wheelchair or bedside chair)?: A Little Help needed to walk in hospital room?: A Little Help needed climbing 3-5 steps with a railing? : A Lot 6 Click Score: 17    End of Session Equipment Utilized During Treatment: Gait belt;Back brace Activity Tolerance: Patient tolerated treatment well Patient left: in bed;with call bell/phone within reach;with bed alarm set;with nursing/sitter in room Nurse Communication: Mobility status PT Visit Diagnosis: Unsteadiness on feet (R26.81);Muscle weakness (generalized) (M62.81);Difficulty in walking, not elsewhere classified (R26.2);Pain Pain - part of body:  (back)     Time: 1208-1227 PT Time Calculation (min) (ACUTE ONLY): 19 min  Charges:    $Gait Training: 8-22 mins PT General Charges $$ ACUTE PT VISIT: 1 Visit                     Alexine Pilant, PT  Acute Rehab Dept [facility]) [phone]  01/27/2023    [facility] 01/27/2023, 1:44 PM

## 2023-01-28 ENCOUNTER — Telehealth: Payer: Self-pay

## 2023-01-28 ENCOUNTER — Inpatient Hospital Stay: Payer: Medicare Other

## 2023-01-28 ENCOUNTER — Telehealth: Payer: Self-pay | Admitting: Pharmacy Technician

## 2023-01-28 ENCOUNTER — Other Ambulatory Visit: Payer: Self-pay

## 2023-01-28 ENCOUNTER — Other Ambulatory Visit (HOSPITAL_COMMUNITY): Payer: Self-pay

## 2023-01-28 ENCOUNTER — Ambulatory Visit
Admit: 2023-01-28 | Discharge: 2023-01-28 | Discharge status: Home / Self Care | Payer: Medicare Other | Attending: Radiation Oncology

## 2023-01-28 ENCOUNTER — Ambulatory Visit: Payer: Medicare Other | Admitting: Radiation Oncology

## 2023-01-28 DIAGNOSIS — N179 Acute kidney failure, unspecified: Secondary | ICD-10-CM | POA: Diagnosis not present

## 2023-01-28 DIAGNOSIS — S22080S Wedge compression fracture of T11-T12 vertebra, sequela: Secondary | ICD-10-CM | POA: Diagnosis not present

## 2023-01-28 DIAGNOSIS — S22080A Wedge compression fracture of T11-T12 vertebra, initial encounter for closed fracture: Secondary | ICD-10-CM | POA: Diagnosis not present

## 2023-01-28 DIAGNOSIS — C7951 Secondary malignant neoplasm of bone: Secondary | ICD-10-CM | POA: Diagnosis not present

## 2023-01-28 DIAGNOSIS — C61 Malignant neoplasm of prostate: Secondary | ICD-10-CM | POA: Diagnosis not present

## 2023-01-28 LAB — RAD ONC ARIA SESSION SUMMARY
Course Elapsed Days: 0
Plan Fractions Treated to Date: 1
Plan Prescribed Dose Per Fraction: 3 Gy
Plan Total Fractions Prescribed: 10
Plan Total Prescribed Dose: 30 Gy
Reference Point Dosage Given to Date: 3 Gy
Reference Point Session Dosage Given: 3 Gy
Session Number: 1

## 2023-01-28 LAB — GLUCOSE, CAPILLARY
Glucose-Capillary: 115 mg/dL — ABNORMAL HIGH (ref 70–99)
Glucose-Capillary: 154 mg/dL — ABNORMAL HIGH (ref 70–99)
Glucose-Capillary: 138 mg/dL — ABNORMAL HIGH (ref 70–99)

## 2023-01-28 MED ORDER — PANTOPRAZOLE SODIUM 40 MG PO TBEC
40.0000 mg | DELAYED_RELEASE_TABLET | Freq: Every day | ORAL | Status: DC
  Administered 2023-01-28: 40 mg via ORAL
  Filled 2023-01-28: qty 1

## 2023-01-28 MED ORDER — DEXAMETHASONE 2 MG PO TABS: 2.0000 mg | ORAL_TABLET | Freq: Two times a day (BID) | ORAL | Status: DC

## 2023-01-28 MED ORDER — GABAPENTIN 300 MG PO CAPS: 300.0000 mg | ORAL_CAPSULE | Freq: Two times a day (BID) | ORAL | Status: DC

## 2023-01-28 MED ORDER — POLYETHYLENE GLYCOL 3350 17 GM/SCOOP PO POWD: 17.0000 g | Freq: Every day | ORAL | Status: DC

## 2023-01-28 MED ORDER — INSULIN ASPART 100 UNIT/ML IJ SOLN: 0.0000 [IU] | Freq: Three times a day (TID) | INTRAMUSCULAR | Status: DC

## 2023-01-28 MED ORDER — PANTOPRAZOLE SODIUM 40 MG PO TBEC: 40.0000 mg | DELAYED_RELEASE_TABLET | Freq: Every day | ORAL | Status: DC

## 2023-01-28 MED ORDER — NYSTATIN 100000 UNIT/GM EX POWD: Freq: Three times a day (TID) | CUTANEOUS | Status: DC

## 2023-01-28 NOTE — Telephone Encounter (Addendum)
 Oral Oncology Pharmacist Encounter  Received new prescription for Erleada  (apalutamide ) for the treatment of metastatic castration sensitive prostate cancer in conjunction with Lupron , planned duration until disease progression or unacceptable toxicity.  Labs from 01/27/2023 (CBC, CMP) assessed, no interventions needed. Prescription dose and frequency assessed for appropriateness. Prescription dose reduced for tolerability per MD.  Current medication list in Epic reviewed, DDIs with Erleada  identified: - oxycodone  (cat C): erleada  may decrease the concentration of oxycodone . Will notify MD to monitor for any decreased effectiveness of the oxycodone . - rosuvastatin  (cat C): erleada  may decrease the concentration of rosuvastatin  and may require a dose increase. Will notify MD to monitor. - dexamethasone  (cat D): patient started on dexamethasone  this morning, erleada  may decrease the concentration of dexamethasone  and may need to be increased if patient continues outpatient.  Evaluated chart and no patient barriers to medication adherence noted.   Patient agreement for treatment documented in MD note on 01/27/2023.  Prescription has been e-scribed to the [facility] for benefits analysis and approval.  Oral Oncology Clinic will continue to follow for insurance authorization, copayment issues, initial counseling and start date.  Tashema Tiller, PharmD Hematology/Oncology Clinical Pharmacist [facility] Oral Chemotherapy Navigation Clinic [phone] 01/28/2023 6:13 AM

## 2023-01-28 NOTE — TOC Progression Note (Signed)
 Transition of Care [facility]) - Progression Note    Patient Details  Name: William Faulkner MRN: [id number] Date of Birth: 1934/09/14  Transition of Care [facility]) CM/SW Contact  Alfonse JONELLE Rex, RN Phone Number: 01/28/2023, 10:52 AM  Clinical Narrative:  SNF auth approved per [place]. Plan ID Auth: [id number], days approved 1/6 to 01/29/2023, team notified.      Expected Discharge Plan: Skilled Nursing Facility Barriers to Discharge: SNF Pending bed offer, Continued Medical Work up, English As A Second Language Teacher  Expected Discharge Plan and Services In-house Referral: Clinical Social Work   Post Acute Care Choice: Home Health Living arrangements for the past 2 months: Single Family Home Expected Discharge Date: 01/23/23               DME Arranged: N/A DME Agency: NA       HH Arranged: PT, OT HH Agency: [facility] Date [facility] Agency Contacted: 01/20/23 Time HH Agency Contacted: [phone] Representative spoke with at [facility] Agency: Cindie   Social Determinants of Health (SDOH) Interventions SDOH Screenings   Food Insecurity: No Food Insecurity (01/18/2023)  Housing: Low Risk  (01/18/2023)  Transportation Needs: No Transportation Needs (01/18/2023)  Utilities: Not At Risk (01/18/2023)  Depression (PHQ2-9): Low Risk  (09/18/2022)  Social Connections: Moderately Isolated (01/20/2023)  Tobacco Use: Medium Risk (01/18/2023)    Readmission Risk Interventions     No data to display

## 2023-01-28 NOTE — Plan of Care (Signed)
  Problem: Clinical Measurements: Goal: Diagnostic test results will improve Outcome: Progressing Goal: Respiratory complications will improve Outcome: Progressing Goal: Cardiovascular complication will be avoided Outcome: Progressing   

## 2023-01-28 NOTE — Telephone Encounter (Signed)
 Oral Oncology Patient Advocate Encounter  Prior Authorization for Erleada  has been approved.    PA# [id number] Effective dates: 01/28/23 through 01/21/24  Patients co-pay is $655.   Estefana Moellers, CPhT-Adv Oncology Pharmacy Patient Advocate [facility] Direct Number: [phone]  Fax: [phone]

## 2023-01-28 NOTE — Telephone Encounter (Signed)
 Oral Oncology Patient Advocate Encounter   Received notification that prior authorization for Erleada  is required.   PA submitted on 01/28/23 Key [id number] Status is pending     Estefana Moellers, CPhT-Adv Oncology Pharmacy Patient Advocate [facility] Direct Number: [phone]  Fax: [phone]

## 2023-01-28 NOTE — Discharge Summary (Signed)
 Physician Discharge Summary   Patient: William Faulkner MRN: [id number] DOB: May 22, 1934  Admit date:     01/18/2023  Discharge date: 01/28/23  Discharge Physician: Nydia Distance, MD    PCP: Amon Aloysius BRAVO, MD   Recommendations at discharge:   Continue Casodex  50 mg daily until patient is started on Erleada /apalutamide  by oncology, Dr Tina (once preauthorization and co-pay is sorted out). Continue palliative radiation treatment for back pain secondary to mets.  Patient will also need palliative to follow outpatient at the facility. Continue Decadron  2 mg twice daily, taper outpatient Patient can use TLSO brace during ambulation or sitting up.  Discharge Diagnoses:  Acute on chronic back pain Metastatic bone mets throughout T-spine   Primary prostate adenocarcinoma (HCC)   AKI (acute kidney injury) (HCC) superimposed on CKD stage IIIa   Diabetes mellitus type II, NIDDM   Hyperlipidemia   Essential hypertension   Hx of CABG   Cerebrovascular disease   NEOPLASM, MALIGNANT, BLADDER, HX OF   Benign essential tremor    Bulge of thoracic disc without myelopathy   Protein-calorie malnutrition, severe    Hospital Course:   Patient is a 89 year old male with CAD/CABG, thoracic cord aneurysm, aortic insufficiency, CKD-3A, CVA, HTN, HLD, bladder cancer and recent diagnosis of vertebral metastasis for which she is followed by orthopedic surgery, presenting with progressive back pain for about 2 months.  No report of trauma or fall.  In ED, CT angio chest, abdomen and pelvis raised concern for subacute and pathologic T12 compression fracture without retropulsion or other complicating features, underlying lytic lesion and progressive loss of height, abnormal bladder base and prostate .  [facility], Dr. Burnetta consulted by EDP.  MRI thoracic spine obtained and showed multiple metastatic deposits throughout the thoracic spine, central disc protrusion at T8-9 without canal or foraminal stenosis,  rightward disc protrusion and annular tear at T11-12 without focal stenosis but no pathologic fractures.  Oncology, urology and palliative consulted per recommendation by Ortho.  Patient underwent IR bone biopsy on 12/31.  Started on Casodex  for presumed prostate cancer.   Patient with AKI likely due to poor p.o. intake, ACE inhibitors and thiazide.  AKI resolved with fluid and holding nephrotoxic meds.    Assessment and Plan:   Acute on chronic back pain: - CTA with subacute and pathologic appearing T12 compression fracture with underlying lytic lesion and loss of height progressed.  -MRI T-spine however showed no pathologic fractures, multiple metastatic deposits throughout the thoracic spine, central disc protrusion at T8-9 and rightward disc fusion at T11-12 without focal stenosis.   -Patient was placed on Casodex  for prostate CA by oncology. -S/p IR bone biopsy on 12/31 showed metastatic carcinoma to bone consistent with primary prostatic adeno CA.   - Patient has been seen by orthospine, Dr. Burnetta and had recommended TLSO brace with ambulation or sitting up.  -Given difficulty with pain control, confusion with narcotics and dizziness with the tramadol , palliative medicine was consulted for assistance.  Patient also placed on palliative XRT for thoracic spine mets. -Started on Decadron  2 mg twice daily for pain, patient has been tolerating well.  Oncology, Dr. Tina recommended to continue same dose and taper outpatient     Vertebral metastatic disease to thoracic spine, primary prostatic carcinoma: - CT and MRI with multiple metastatic deposits throughout the thoracic spine.  No pathologic fracture per MRI. -  Patient has history of bladder cancer.  CT shows abnormal bladder base and prostate. -Urology, oncology consulted, s/p IR bone  biopsy.  Biopsy results showed metastatic carcinoma to bone consistent with primary prostatic carcinoma -Per oncology, Dr. Tina, continue Casodex  50 mg  daily for now and on discharge until follow-up in clinic.  Then will plan on transitioning to apalutamide  as outpatient. -Try to avoid narcotics as able and continue dexamethasone  2 mg twice daily    Acute kidney injury on  CKD-3A:  Likely due to poor p.o. intake, ACE inhibitor's and HCTZ.  Baseline creatinine has been 1.3-1.5 - Renal US  without acute finding but enlarged prostate.  AKI improved with IV fluid. -Creatinine plateaued at 2.44 on 12/30, resolved -Continue holding ACE, hydrochlorothiazide  -Creatinine 1.1 at discharge   Acute metabolic encephalopathy: - Likely due to dehydration and gabapentin , narcotics -Has been off the narcotics, currently alert and oriented.   DM-2 with hyperglycemia, neuropathy, hyperlipidemia and CKD-3A: -  A1c 6.5% on 8/28. -Started on dexamethasone , continue sensitive sliding scale insulin      History of CAD/CABG:  -No acute issues, continue aspirin , propranolol     Essential hypertension/sinus bradycardia:  -Bradycardia resolved.  -Continue holding Lotensin , HCTZ, hydralazine . -Continue propranolol    Benign essential tremor -Continue home propranolol .   Glaucoma -Continue timolol  drops. -Follow-up with ophthalmology as an outpatient.   Physical deconditioning -PT recommended SNF   Estimated body mass index is 23.62 kg/m as calculated from the following:   Height as of this encounter: 6' 2 (1.88 m).   Weight as of this encounter: 83.5 kg.     Pain control - [place]  Controlled Substance Reporting System database was reviewed. and patient was instructed, not to drive, operate heavy machinery, perform activities at heights, swimming or participation in water activities or provide baby-sitting services while on Pain, Sleep and Anxiety Medications; until their outpatient Physician has advised to do so again. Also recommended to not to take more than prescribed Pain, Sleep and Anxiety Medications.  Consultants: Oncology, radiation  oncology, palliative medicine, IR, urology Procedures performed: Bone biopsy Disposition: Skilled nursing facility Diet recommendation: Regular diet Discharge Diet Orders (From admission, onward)     Start     Ordered   01/22/23 0000  Diet general        01/22/23 0934            DISCHARGE MEDICATION: Allergies as of 01/28/2023       Reactions   Atorvastatin Other (See Comments)   REACTION: increased cpk   Celecoxib Other (See Comments)   REACTION: palpitations= celebrex   Sulfonamide Derivatives Other (See Comments)   Unknown   Zolpidem Tartrate Other (See Comments)   REACTION: made patient feel weird= ambien        Medication List     STOP taking these medications    benazepril  10 MG tablet Commonly known as: LOTENSIN    gabapentin  600 MG tablet Commonly known as: NEURONTIN  Replaced by: gabapentin  300 MG capsule   hydrALAZINE  50 MG tablet Commonly known as: APRESOLINE    hydrochlorothiazide  25 MG tablet Commonly known as: HYDRODIURIL    metFORMIN  850 MG tablet Commonly known as: GLUCOPHAGE    oxyCODONE -acetaminophen  5-325 MG tablet Commonly known as: PERCOCET/ROXICET       TAKE these medications    acetaminophen  500 MG tablet Commonly known as: TYLENOL  Take 2 tablets (1,000 mg total) by mouth every 8 (eight) hours for 5 days, THEN 2 tablets (1,000 mg total) every 8 (eight) hours as needed for up to 5 days. Start taking on: January 22, 2023   aspirin  81 MG tablet Take 81 mg by mouth daily.  augmented betamethasone  dipropionate 0.05 % cream Commonly known as: DIPROLENE -AF Apply topically 2 (two) times daily.   bicalutamide  50 MG tablet Commonly known as: CASODEX  Take 1 tablet (50 mg total) by mouth daily.   cholecalciferol  1000 units tablet Commonly known as: VITAMIN D  Take 1,000 Units by mouth daily.   [employer] Vitamin B-12 5000 MCG Subl Generic drug: Cyanocobalamin  Take 5,000 mcg by mouth daily.   dexamethasone  2 MG tablet Commonly known as:  DECADRON  Take 1 tablet (2 mg total) by mouth 2 (two) times daily with a meal.   feeding supplement Liqd Take 237 mLs by mouth 2 (two) times daily between meals.   gabapentin  300 MG capsule Commonly known as: Neurontin  Take 1 capsule (300 mg total) by mouth 2 (two) times daily. Replaces: gabapentin  600 MG tablet   insulin  aspart 100 UNIT/ML injection Commonly known as: novoLOG  Inject 0-9 Units into the skin 3 (three) times daily with meals. Sliding scale CBG 70 - 120: 0 units CBG 121 - 150: 1 unit,  CBG 151 - 200: 2 units,  CBG 201 - 250: 3 units,  CBG 251 - 300: 5 units,  CBG 301 - 350: 7 units,  CBG 351 - 400: 9 units   CBG > 400: 9 units and notify your MD   ketoconazole  2 % cream Commonly known as: NIZORAL  Apply 1 application topically daily.   nystatin  powder Commonly known as: MYCOSTATIN /NYSTOP  Apply topically 3 (three) times daily. Apply to affected area.   pantoprazole  40 MG tablet Commonly known as: PROTONIX  Take 1 tablet (40 mg total) by mouth daily at 6 (six) AM.   polyethylene glycol powder 17 GM/SCOOP powder Commonly known as: MiraLax  Take 17 g by mouth daily. For constipation.   propranolol  40 MG tablet Commonly known as: INDERAL  TAKE 1 TABLET BY MOUTH DAILY What changed:  how much to take how to take this when to take this additional instructions   rosuvastatin  20 MG tablet Commonly known as: CRESTOR  TAKE 1 TABLET BY MOUTH DAILY   senna-docusate 8.6-50 MG tablet Commonly known as: Senokot-S Take 1 tablet by mouth 2 (two) times daily between meals as needed for mild constipation.   timolol  0.5 % ophthalmic solution Commonly known as: TIMOPTIC  Place 1 drop into both eyes every morning. As directed               Discharge Care Instructions  (From admission, onward)           Start     Ordered   01/28/23 0000  If the dressing is still on your incision site when you go home, remove it on the third day after your surgery date. Remove  dressing if it begins to fall off, or if it is dirty or damaged before the third day.        01/28/23 1053            Follow-up Information     [facility] Follow up.   Specialty: Home Health Services Why: [facility] will provide PT and OT in the home after discharge. Contact information: [address] [place] [address] [phone]         Amon Aloysius BRAVO, MD. Schedule an appointment as soon as possible for a visit in 1 week(s).   Specialty: Internal Medicine Contact information: [phone] [address] [facility] [address] [place] [address] [phone]                Discharge Exam:  Filed Weights   01/18/23 0720  Weight: 83.5 kg   S: No acute complaints, generalized weakness.  Hoping to discharge today after the radiation treatment.  BP (!) 149/58 (BP Location: Left Arm)   Pulse (!) 59   Temp 97.9 F (36.6 C)   Resp 17   Ht 6' 2 (1.88 m)   Wt 83.5 kg   SpO2 94%   BMI 23.62 kg/m   Physical Exam General: Alert and oriented x 3, NAD Cardiovascular: S1 S2 clear, RRR.  Respiratory: CTAB Gastrointestinal: Soft, nontender, nondistended, NBS Ext: no pedal edema bilaterally Neuro: no new deficits Psych: alert and oriented, normal affect   Condition at discharge: fair  The results of significant diagnostics from this hospitalization (including imaging, microbiology, ancillary and laboratory) are listed below for reference.   Imaging Studies: DG Chest 2 View Result Date: 01/23/2023 CLINICAL DATA:  Follow-up pneumonia EXAM: CHEST - 2 VIEW COMPARISON:  01/18/2023 CT FINDINGS: Cardiac shadow is enlarged. Postsurgical changes and aortic calcifications are again noted. Lungs are well aerated bilaterally. Minimal left basilar atelectasis is seen. No bony abnormality is noted. IMPRESSION: Minimal left basilar atelectasis. Electronically Signed   By: Oneil Devonshire M.D.   On: 01/23/2023 19:19   IR Fluoro Guide Ndl Plmt / BX Result Date:  01/21/2023 INDICATION: Pathologic fracture. EXAM: FLUOROSCOPY GUIDED T12 CORE BONE BIOPSY MEDICATIONS: None. ANESTHESIA/SEDATION: Moderate (conscious) sedation was employed during this procedure. A total of Versed  1.5 mg and Fentanyl  75 mcg was administered intravenously by the radiology nurse. Total intra-service moderate Sedation Time: 29 minutes. The patient's level of consciousness and vital signs were monitored continuously by radiology nursing throughout the procedure under my direct supervision. Fluoroscopy does: 657 mGy air Kerma. COMPLICATIONS: None immediate. PROCEDURE: Informed written consent was obtained from the patient after a thorough discussion of the procedural risks, benefits and alternatives. All questions were addressed. Maximal Sterile Barrier Technique was utilized including caps, mask, sterile gowns, sterile gloves, sterile drape, hand hygiene and skin antiseptic. A timeout was performed prior to the initiation of the procedure. The patient was placed in prone position on the angiography table. The thoracic spine region was prepped and draped in a sterile fashion. Under fluoroscopy, the T12 vertebral body was delineated and the skin area was marked. The skin was infiltrated with a 1% Lidocaine  approximately 3 cm lateral to the spinous process projection on the right. Using a 22-gauge spinal needle, the soft issue and the peripedicular space and periosteum were infiltrated with Bupivacaine  0.5%. A skin incision was made at the access site. Subsequently, an 11-gauge Kyphon trocar was inserted under fluoroscopic guidance until contact with the pedicle was obtained. The trocar was inserted under light hammer tapping into the pedicle until the posterior boundaries of the vertebral body was reached. The diamond mandrill was removed and 3 core bone biopsy samples were obtained. The trocar was later removed. The access site was cleaned and covered with a sterile bandage. IMPRESSION: Fluoroscopy  guided T12 core bone biopsy performed using tiny fragments on each core biopsy pass. Samples obtained were sent for tissue exam. Electronically Signed   By: [name] M.D.   On: 01/21/2023 12:46   DG Bone Survey Met Result Date: 01/20/2023 CLINICAL DATA:  Lytic bone lesions on x-ray. Multiple metastatic deposits throughout the thoracic spine. EXAM: METASTATIC BONE SURVEY COMPARISON:  Thoracic spine MRI 01/18/2023. Chest abdomen pelvis CT 01/18/2023. FINDINGS: Patient with known lesions in the thoracic and lumbar spine. This was better assessed on  recent CT and MRI. The majority of these lesions are not well-defined by radiograph. The known T12 compression fracture is not well assessed on the current exam due to osseous overlap. The known left iliac sclerotic lesion is not well seen by radiograph. Area of sclerosis projects over the lateral aspect of the right distal tibia at the ankle joint. Other there are extremity lesion is seen by radiograph. Prior median sternotomy. Blunting of the left costophrenic angle, likely epicardial fat pad and atelectasis. Aortic atherosclerosis and vascular calcifications. IMPRESSION: 1. Patient with known spinal osseous bone lesions. This was better assessed on recent CT and MRI. The majority of these lesions are not well-defined by radiograph. 2. The known T12 compression fracture is not well assessed on the current exam due to osseous overlap. 3. The known left iliac sclerotic lesion is not well seen by radiograph. 4. Area of sclerosis projects over the lateral aspect of the right distal tibia at the ankle joint, indeterminate. Electronically Signed   By: Andrea Gasman M.D.   On: 01/20/2023 22:50   US  RENAL Result Date: 01/20/2023 CLINICAL DATA:  Acute kidney injury EXAM: RENAL / URINARY TRACT ULTRASOUND COMPLETE COMPARISON:  CT 01/18/2023 FINDINGS: Right Kidney: Renal measurements: 9.1 x 4.7 x 4.4 cm = volume: 9.5 mL. Echogenicity within normal  limits. No mass or hydronephrosis visualized. Left Kidney: Renal measurements: 11.9 x 5.3 x 5 cm = volume: 165 mL. Echogenicity within normal limits. No mass or hydronephrosis visualized. Bladder: Appears normal for degree of bladder distention. Other: Prostate enlargement at least 5.7 x 5.3 cm. IMPRESSION: 1. Negative for hydronephrosis. 2. Prostate enlargement. Electronically Signed   By: JONETTA Faes M.D.   On: 01/20/2023 16:00   MR THORACIC SPINE WO CONTRAST Result Date: 01/18/2023 CLINICAL DATA:  Bladder cancer. Evaluate for metastatic disease to the thoracic spine. Chronic back pain. Weakness beginning approximately 1 month ago. EXAM: MRI THORACIC SPINE WITHOUT CONTRAST TECHNIQUE: Multiplanar, multisequence MR imaging of the thoracic spine was performed. No intravenous contrast was administered. COMPARISON:  CT angio chest, abdomen and pelvis 01/18/2023. FINDINGS: Alignment: No significant listhesis is present. Mild rightward curvature is centered at T8-9. Thoracic kyphosis is intact. Vertebrae: Multiple metastatic deposits are present throughout the thoracic spine. A 6 mm lesion is present within the right pedicle of T1. An 8 mm lesion is present posterior elements of T2. Anterior lesion at T2 measures 13 mm. Metastatic lesions are present within the T3 vertebral body and the spinous process of T3. A right superior lesion at T5 extends to the pedicle. Rounded metastases at T7 measure 10 and 11 mm. Larger metastases at T8 extend from the superior to inferior endplate, 18 mm. Similar larger lesions are present throughout the remainder of the lower thoracic spine. A right T11 lesion extends into the right pedicle. Diffuse metastatic infiltration is present in the T12 vertebral body. No pathologic fractures are present. Extensive infiltration of the L1 vertebral body is present. Cord:  Normal signal and morphology. Paraspinal and other soft tissues: See CT report from the same day for further description of  metastatic disease outside the spine. Disc levels: A central disc protrusion at T8-9 effaces the ventral CSF. The foramina are patent bilaterally. No significant disc disease or stenosis is present above this level. T9-10: Negative. T10-11: Negative. T11-12: A rightward disc protrusion and annular tear is present without focal stenosis. T12-L1: Negative. IMPRESSION: 1. Multiple metastatic deposits throughout the thoracic spine as described. 2. No pathologic fractures. 3. Central disc protrusion at  T8-9 effaces the ventral CSF. 4. Rightward disc protrusion and annular tear at T11-12 without focal stenosis. Electronically Signed   By: Lonni Necessary M.D.   On: 01/18/2023 19:20   CT Angio Chest/Abd/Pel for Dissection W and/or Wo Contrast Result Date: 01/18/2023 CLINICAL DATA:  89 year old male with pain, weakness. Known mild aneurysmal dilatation of the ascending thoracic aorta. Bladder cancer. EXAM: CT ANGIOGRAPHY CHEST, ABDOMEN AND PELVIS TECHNIQUE: Non-contrast CT of the chest was initially obtained. Multidetector CT imaging through the chest, abdomen and pelvis was performed using the standard protocol during bolus administration of intravenous contrast. Multiplanar reconstructed images and MIPs were obtained and reviewed to evaluate the vascular anatomy. RADIATION DOSE REDUCTION: This exam was performed according to the departmental dose-optimization program which includes automated exposure control, adjustment of the mA and/or kV according to patient size and/or use of iterative reconstruction technique. CONTRAST:  75mL OMNIPAQUE  IOHEXOL  350 MG/ML SOLN COMPARISON:  CTA chest 08/02/2022 and earlier. Restaging CT Abdomen and Pelvis 12/11/2022 FINDINGS: CTA CHEST FINDINGS Cardiovascular: Previous CABG. Extensive Calcified aortic atherosclerosis. Tortuous ascending aorta is stable, smoothly tapers in the arch. Negative for thoracic aortic dissection, periaortic hematoma. Heart size is stable, within normal  limits. No pericardial effusion. Little contrast in the pulmonary arteries. Proximal great vessel atherosclerosis appears stable since July. Mediastinum/Nodes: Negative for mediastinal hematoma, mass, lymphadenopathy. Lungs/Pleura: Stable lung volumes since July. Patchy lung base atelectasis or scarring has not significantly changed. No pleural effusion. Small volume new retained secretions in the trachea just below the thoracic inlet on series 4 image 30. But elsewhere the major airways are patent. No active pulmonary inflammation is identified. Musculoskeletal: Chronic sternotomy. Chronic right 2nd rib deformity. T1 through T11 thoracic vertebrae appear stable since July. T12 compression fracture with mild comminution is new since July but was present, subtle in November when other skeletal metastatic disease was suspected. Central T12 loss of vertebral body height up to 30% (series 6, image 105). Lucency within the T12 body appears progressed since July. No retropulsion. T12 pedicles and posterior elements appear intact and aligned. There is mild anterior and lateral prevertebral soft tissue or edema which is increased since November on series 11, image 56. No epidural tumor by CT. Review of the MIP images confirms the above findings. CTA ABDOMEN AND PELVIS FINDINGS VASCULAR Aortoiliac calcified atherosclerosis. Extensive aortic and branch calcified atherosclerosis but the major arterial structures in the abdomen and pelvis remain patent. Negative for abdominal aortic dissection or aneurysm. Review of the MIP images confirms the above findings. NON-VASCULAR Hepatobiliary: Stable liver. Cholelithiasis. No gallbladder inflammation by CT. Pancreas: Diminutive. Spleen: Diminutive. Adrenals/Urinary Tract: Normal adrenal glands. Nonobstructed kidneys with symmetric renal enhancement. No hydroureter. Abnormal prostate at the base of the bladder as below. Diminutive bladder similar to the November CT. Stomach/Bowel:  Decompressed large bowel from the splenic flexure distally but transverse colon is redundant with moderate retained gas and stool, increased since November. Low-density retained stool in the right colon. Cecum is on a lax mesentery. No dilated small bowel. No large bowel inflammation identified. Stomach and duodenum appear negative. No free air or free fluid. Lymphatic: No lymphadenopathy identified in the abdomen or pelvis. Reproductive: Abnormally heterogeneous, lobulated, masslike appearance of the prostate on series 10, image 203 not significantly changed from the restaging CT last month (please see that report) Other: No pelvis free fluid. Musculoskeletal: Lumbar vertebrae appear stable since November. Medial left iliac sclerotic bone lesions have not significantly changed. Sacrum and SI joints appear intact. Pelvis and proximal  femurs appear to remain intact. Review of the MIP images confirms the above findings. IMPRESSION: 1. Negative for Aortic Dissection. Stable mild fusiform aneurysmal enlargement of the ascending aorta. Severe Aortic Atherosclerosis (ICD10-I70.0). 2. Positive for a subacute and pathologic appearing T12 compression fracture, with underlying lytic lesion and loss of height progressed since November. No retropulsion or other complicating features. In conjunction with November CT findings of skeletal metastatic disease, abnormal bladder base and prostate as detailed on that exam. 3. No other acute finding in the Chest, Abdomen, or Pelvis. Cholelithiasis. Electronically Signed   By: VEAR Hurst M.D.   On: 01/18/2023 10:37    Microbiology: Results for orders placed or performed in visit on 03/16/20  Urine Culture     Status: Abnormal   Collection Time: 03/16/20 12:00 PM   Specimen: Urine  Result Value Ref Range Status   MICRO NUMBER: [id number]  Final   SPECIMEN QUALITY: Adequate  Final   Sample Source NOT GIVEN  Final   STATUS: FINAL  Final   ISOLATE 1: Enterococcus faecalis (A)  Final     Comment: Greater than 100,000 CFU/mL of Enterococcus faecalis      Susceptibility   Enterococcus faecalis - URINE CULTURE POSITIVE 1    AMPICILLIN <=2 Sensitive     VANCOMYCIN 1 Sensitive     NITROFURANTOIN* <=16 Sensitive      * Legend:S = Susceptible  I = IntermediateR = Resistant  NS = Not susceptible* = Not tested  NR = Not reported**NN = See antimicrobic comments    Labs: CBC: Recent Labs  Lab 01/22/23 0342 01/23/23 0356 01/24/23 0345 01/26/23 0414 01/27/23 0930  WBC 7.1 7.3 6.5 6.3 6.8  HGB 10.9* 11.5* 12.4* 11.7* 14.7  HCT 34.8* 35.6* 39.3 37.4* 45.8  MCV 91.1 89.7 90.6 91.2 89.8  PLT 246 270 302 339 456*   Basic Metabolic Panel: Recent Labs  Lab 01/22/23 0342 01/23/23 0356 01/24/23 0345 01/26/23 0414 01/27/23 0930  NA 136 136 140 138 137  K 3.6 3.7 3.9 4.2 4.4  CL 107 107 109 104 100  CO2 22 20* 22 26 26   GLUCOSE 113* 99 107* 115* 136*  BUN 55* 42* 34* 32* 25*  CREATININE 1.56* 1.28* 1.08 1.16 1.12  CALCIUM  8.5* 9.0 9.5 9.3 10.1  MG 2.0 1.8 1.8  --   --   PHOS 3.2 2.7 2.6  --   --    Liver Function Tests: Recent Labs  Lab 01/22/23 0342 01/23/23 0356 01/24/23 0345 01/27/23 0930  AST  --   --   --  24  ALT  --   --   --  27  ALKPHOS  --   --   --  100  BILITOT  --   --   --  0.8  PROT  --   --   --  7.9  ALBUMIN 2.5* 2.7* 2.8* 3.5   CBG: Recent Labs  Lab 01/27/23 0744 01/27/23 1141 01/27/23 1700 01/27/23 2100 01/28/23 0810  GLUCAP 122* 131* 111* 125* 115*    Discharge time spent: greater than 30 minutes.  Signed: Nydia Distance, MD [facility] 01/28/2023

## 2023-01-28 NOTE — Progress Notes (Signed)
 Daily Progress Note   Patient Name: William Faulkner       Date: 01/28/2023 DOB: 03-May-1934  Age: 89 y.o. MRN#: [id number] Attending Physician: Davia Nydia POUR, MD Primary Care Physician: Amon Aloysius BRAVO, MD Admit Date: 01/18/2023  Reason for Consultation/Follow-up: Establishing goals of care  Subjective: Awake alert resting in bed no acute distress  Length of Stay: 8  Current Medications: Scheduled Meds:   acetaminophen   1,000 mg Oral Q8H   aspirin  EC  81 mg Oral Daily   bicalutamide   50 mg Oral Daily   cholecalciferol   1,000 Units Oral Daily   dexamethasone   2 mg Oral BID WC   enoxaparin  (LOVENOX ) injection  40 mg Subcutaneous Q24H   feeding supplement  1 Container Oral TID BM   feeding supplement  237 mL Oral BID BM   gabapentin   300 mg Oral BID   guaiFENesin   600 mg Oral BID   insulin  aspart  0-9 Units Subcutaneous TID WC   lidocaine   1 patch Transdermal Q24H   multivitamin with minerals  1 tablet Oral Daily   nystatin    Topical TID   pantoprazole   40 mg Oral Q0600   polyethylene glycol  17 g Oral Daily   propranolol   40 mg Oral Daily   rosuvastatin   20 mg Oral Daily   timolol   1 drop Both Eyes q morning    Continuous Infusions:   PRN Meds: HYDROmorphone  (DILAUDID ) injection, naLOXone  (NARCAN )  injection, ondansetron  **OR** ondansetron  (ZOFRAN ) IV  Physical Exam         Awake alert Resting in bed No acute distress Regular work of breathing No lower extremity edema  Vital Signs: BP (!) 149/58 (BP Location: Left Arm)   Pulse (!) 59   Temp 97.9 F (36.6 C)   Resp 17   Ht 6' 2 (1.88 m)   Wt 83.5 kg   SpO2 94%   BMI 23.62 kg/m  SpO2: SpO2: 94 % O2 Device: O2 Device: Room Air O2 Flow Rate:    Intake/output summary:  Intake/Output Summary (Last 24 hours) at  01/28/2023 1208 Last data filed at 01/28/2023 0648 Gross per 24 hour  Intake 0 ml  Output 475 ml  Net -475 ml   LBM: Last BM Date : 01/26/23 Baseline Weight: Weight: 83.5 kg Most recent weight: Weight:  83.5 kg       Palliative Assessment/Data:      Patient Active Problem List   Diagnosis Date Noted   DNR (do not resuscitate) discussion 01/27/2023   Goals of care, counseling/discussion 01/27/2023   Medication management 01/27/2023   Protein-calorie malnutrition, severe 01/25/2023   Palliative care encounter 01/24/2023   Prostate cancer (HCC) 01/21/2023   AKI (acute kidney injury) (HCC) 01/20/2023   Bulge of thoracic disc without myelopathy 01/19/2023   Elevated PSA 01/19/2023   Compression fracture of T12 vertebra (HCC) 01/18/2023   Metastasis to bone (HCC) 01/18/2023   RBBB 12/21/2019   CRI (chronic renal insufficiency), stage 3 (moderate) (HCC) 12/21/2019   Chronic back pain 11/21/2016   Neuropathy 11/21/2016   Benign essential tremor 07/11/2016   Follow-up -------------PCP NOTES 09/28/2014   Dizziness 08/10/2014   Cholelithiasis 03/24/2014   Annual physical exam 11/01/2013   Tremor 03/03/2013   Cervical spondylosis 12/17/2012   Inguinal hernia unilateral, non-recurrent, right 08/26/2011   Aortic aneurysm (HCC) 01/03/2010   Hx of CABG 06/20/2009   ANGINA DECUBITUS 06/02/2009   Cerebrovascular disease 06/02/2009   Diabetes and neuropathy 03/14/2008   NEOPLASM, MALIGNANT, BLADDER, HX OF 12/21/2007   BPH (benign prostatic hyperplasia) 01/12/2007   Hyperlipidemia 12/17/2006   Essential hypertension 12/17/2006   Aortic valve disorder 08/18/2006    Palliative Care Assessment & Plan   Patient Profile:    Assessment: 89 year old male with history of diabetes, CAD/CABG, hypertension, glaucoma presented with back pain found to have diffuse bony lesions with elevated PSA of 261. Biopsy confirmed prostate cancer.      Recommendations/Plan: Patient to be discharged to  skilled nursing facility for rehabilitation attempt, he is to follow-up with medical oncology and radiation oncology in the outpatient setting.  Would recommend using palliative support at [facility] as an extra layer of support if need be for symptom management and for ongoing goals of care discussions.  Full code full scope.  Anticipating discharge soon.  Goals of Care and Additional Recommendations: Limitations on Scope of Treatment: Full Scope Treatment  Code Status:    Code Status Orders  (From admission, onward)           Start     Ordered   01/18/23 1332  Full code  Continuous       Question:  By:  Answer:  Consent: discussion documented in EHR   01/18/23 1332           Code Status History     This patient has a current code status but no historical code status.      Advance Directive Documentation    Flowsheet Row Most Recent Value  Type of Advance Directive Living will  Pre-existing out of facility DNR order (yellow form or pink MOST form) --  MOST Form in Place? --       Prognosis:  Unable to determine  Discharge Planning: Skilled Nursing Facility for rehab with recommendation for palliative care service at [facility] for follow-up  Care plan was discussed with  IDT  Thank you for allowing the Palliative Medicine Team to assist in the care of this patient.  Low MDM.     Greater than 50%  of this time was spent counseling and coordinating care related to the above assessment and plan.  Lonia Serve, MD  Please contact Palliative Medicine Team phone at [phone] for questions and concerns.

## 2023-01-28 NOTE — Progress Notes (Signed)
 William Faulkner   DOB:1935-01-18   FM#:[id number]    ASSESSMENT & PLAN:  89 year old male with history of diabetes, CAD/CABG, hypertension, glaucoma presented with back pain found to have diffuse bony lesions with elevated PSA of 261. Biopsy confirmed prostate cancer.   [facility] Please continue bicalutamide  50 mg daily for now and on discharge until he obtained apalutamide  we will switch as outpatient Will start ADT as outpatient Continue vitamin D  on discharge  Cancer pain Start palliative radiation per Dr. Patrcia from 1/7-1/20/25 Adding dexamethasone  2mg  twice daily for pain control and try to avoid narcotics as able and wean off as outpatient after completed radiation  Appreciate medicine team with other chronic medical conditions.  Continue optimize cardiovascular health, hypertension, diabetes as outpatient.  Code Status Full  Goals of care Discussed  Discharge planning To SNF  All questions were answered.  Coordination with other specialty, and care members and communicated with his wife.   Thank you for the consult. Will request outpatient follow up.  [name], MD 01/28/2023 11:02 AM  Subjective:  William Faulkner reports feeling better today.  No signs of confusion.  Reported no pain this morning.  No worsening cough, shortness of breath or chest pain.  Denies weakness.  Objective:  Vitals:   01/27/23 2102 01/28/23 0634  BP: (!) 164/57 (!) 149/58  Pulse: 85 (!) 59  Resp: 17 17  Temp: 97.7 F (36.5 C) 97.9 F (36.6 C)  SpO2: 96% 94%     Intake/Output Summary (Last 24 hours) at 01/28/2023 1102 Last data filed at 01/28/2023 9351 Gross per 24 hour  Intake 0 ml  Output 475 ml  Net -475 ml    GENERAL: alert, no distress and comfortable SKIN: skin color normal EYES: normal, sclera clear NECK: supple, no palpable mass LYMPH:  no palpable cervical lymphadenopathy LUNGS: No wheeze, rales and clear to auscultation bilaterally with normal breathing effort.  HEART: regular  rate & rhythm  ABDOMEN: abdomen soft, non-tender and non-distended Musculoskeletal: no lower extremity edema NEURO: alert with fluent speech, no focal motor/sensory deficits Lower extremity strength 5 out of 5.   Labs:  Recent Labs    01/18/23 0822 01/20/23 0321 01/23/23 0356 01/24/23 0345 01/26/23 0414 01/27/23 0930  NA 139   < > 136 140 138 137  K 4.3   < > 3.7 3.9 4.2 4.4  CL 105   < > 107 109 104 100  CO2 21*   < > 20* 22 26 26   GLUCOSE 128*   < > 99 107* 115* 136*  BUN 38*   < > 42* 34* 32* 25*  CREATININE 1.47*   < > 1.28* 1.08 1.16 1.12  CALCIUM  10.0   < > 9.0 9.5 9.3 10.1  GFRNONAA 46*   < > 55* >60 >60 >60  PROT 7.6  --   --   --   --  7.9  ALBUMIN 3.6   < > 2.7* 2.8*  --  3.5  AST 22  --   --   --   --  24  ALT 16  --   --   --   --  27  ALKPHOS 85  --   --   --   --  100  BILITOT 0.9  --   --   --   --  0.8   < > = values in this interval not displayed.    Studies:  DG Chest 2 View Result Date: 01/23/2023 CLINICAL DATA:  Follow-up pneumonia EXAM: CHEST - 2 VIEW COMPARISON:  01/18/2023 CT FINDINGS: Cardiac shadow is enlarged. Postsurgical changes and aortic calcifications are again noted. Lungs are well aerated bilaterally. Minimal left basilar atelectasis is seen. No bony abnormality is noted. IMPRESSION: Minimal left basilar atelectasis. Electronically Signed   By: William Devonshire M.D.   On: 01/23/2023 19:19   IR Fluoro Guide Ndl Plmt / BX Result Date: 01/21/2023 INDICATION: Pathologic fracture. EXAM: FLUOROSCOPY GUIDED T12 CORE BONE BIOPSY MEDICATIONS: None. ANESTHESIA/SEDATION: Moderate (conscious) sedation was employed during this procedure. A total of Versed  1.5 mg and Fentanyl  75 mcg was administered intravenously by the radiology nurse. Total intra-service moderate Sedation Time: 29 minutes. The patient's level of consciousness and vital signs were monitored continuously by radiology nursing throughout the procedure under my direct supervision. Fluoroscopy does:  657 mGy air Kerma. COMPLICATIONS: None immediate. PROCEDURE: Informed written consent was obtained from the patient after a thorough discussion of the procedural risks, benefits and alternatives. All questions were addressed. Maximal Sterile Barrier Technique was utilized including caps, mask, sterile gowns, sterile gloves, sterile drape, hand hygiene and skin antiseptic. A timeout was performed prior to the initiation of the procedure. The patient was placed in prone position on the angiography table. The thoracic spine region was prepped and draped in a sterile fashion. Under fluoroscopy, the T12 vertebral body was delineated and the skin area was marked. The skin was infiltrated with a 1% Lidocaine  approximately 3 cm lateral to the spinous process projection on the right. Using a 22-gauge spinal needle, the soft issue and the peripedicular space and periosteum were infiltrated with Bupivacaine  0.5%. A skin incision was made at the access site. Subsequently, an 11-gauge Kyphon trocar was inserted under fluoroscopic guidance until contact with the pedicle was obtained. The trocar was inserted under light hammer tapping into the pedicle until the posterior boundaries of the vertebral body was reached. The diamond mandrill was removed and 3 core bone biopsy samples were obtained. The trocar was later removed. The access site was cleaned and covered with a sterile bandage. IMPRESSION: Fluoroscopy guided T12 core bone biopsy performed using tiny fragments on each core biopsy pass. Samples obtained were sent for tissue exam. Electronically Signed   By: William everitt Nile Lizzie M.D.   On: 01/21/2023 12:46   DG Bone Survey Met Result Date: 01/20/2023 CLINICAL DATA:  Lytic bone lesions on x-ray. Multiple metastatic deposits throughout the thoracic spine. EXAM: METASTATIC BONE SURVEY COMPARISON:  Thoracic spine MRI 01/18/2023. Chest abdomen pelvis CT 01/18/2023. FINDINGS: Patient with known lesions in the thoracic  and lumbar spine. This was better assessed on recent CT and MRI. The majority of these lesions are not well-defined by radiograph. The known T12 compression fracture is not well assessed on the current exam due to osseous overlap. The known left iliac sclerotic lesion is not well seen by radiograph. Area of sclerosis projects over the lateral aspect of the right distal tibia at the ankle joint. Other there are extremity lesion is seen by radiograph. Prior median sternotomy. Blunting of the left costophrenic angle, likely epicardial fat pad and atelectasis. Aortic atherosclerosis and vascular calcifications. IMPRESSION: 1. Patient with known spinal osseous bone lesions. This was better assessed on recent CT and MRI. The majority of these lesions are not well-defined by radiograph. 2. The known T12 compression fracture is not well assessed on the current exam due to osseous overlap. 3. The known left iliac sclerotic lesion is not well seen by radiograph. 4. Area of  sclerosis projects over the lateral aspect of the right distal tibia at the ankle joint, indeterminate. Electronically Signed   By: William Gasman M.D.   On: 01/20/2023 22:50   US  RENAL Result Date: 01/20/2023 CLINICAL DATA:  Acute kidney injury EXAM: RENAL / URINARY TRACT ULTRASOUND COMPLETE COMPARISON:  CT 01/18/2023 FINDINGS: Right Kidney: Renal measurements: 9.1 x 4.7 x 4.4 cm = volume: 9.5 mL. Echogenicity within normal limits. No mass or hydronephrosis visualized. Left Kidney: Renal measurements: 11.9 x 5.3 x 5 cm = volume: 165 mL. Echogenicity within normal limits. No mass or hydronephrosis visualized. Bladder: Appears normal for degree of bladder distention. Other: Prostate enlargement at least 5.7 x 5.3 cm. IMPRESSION: 1. Negative for hydronephrosis. 2. Prostate enlargement. Electronically Signed   By: William Faulkner M.D.   On: 01/20/2023 16:00   MR THORACIC SPINE WO CONTRAST Result Date: 01/18/2023 CLINICAL DATA:  Bladder cancer. Evaluate  for metastatic disease to the thoracic spine. Chronic back pain. Weakness beginning approximately 1 month ago. EXAM: MRI THORACIC SPINE WITHOUT CONTRAST TECHNIQUE: Multiplanar, multisequence MR imaging of the thoracic spine was performed. No intravenous contrast was administered. COMPARISON:  CT angio chest, abdomen and pelvis 01/18/2023. FINDINGS: Alignment: No significant listhesis is present. Mild rightward curvature is centered at T8-9. Thoracic kyphosis is intact. Vertebrae: Multiple metastatic deposits are present throughout the thoracic spine. A 6 mm lesion is present within the right pedicle of T1. An 8 mm lesion is present posterior elements of T2. Anterior lesion at T2 measures 13 mm. Metastatic lesions are present within the T3 vertebral body and the spinous process of T3. A right superior lesion at T5 extends to the pedicle. Rounded metastases at T7 measure 10 and 11 mm. Larger metastases at T8 extend from the superior to inferior endplate, 18 mm. Similar larger lesions are present throughout the remainder of the lower thoracic spine. A right T11 lesion extends into the right pedicle. Diffuse metastatic infiltration is present in the T12 vertebral body. No pathologic fractures are present. Extensive infiltration of the L1 vertebral body is present. Cord:  Normal signal and morphology. Paraspinal and other soft tissues: See CT report from the same day for further description of metastatic disease outside the spine. Disc levels: A central disc protrusion at T8-9 effaces the ventral CSF. The foramina are patent bilaterally. No significant disc disease or stenosis is present above this level. T9-10: Negative. T10-11: Negative. T11-12: A rightward disc protrusion and annular tear is present without focal stenosis. T12-L1: Negative. IMPRESSION: 1. Multiple metastatic deposits throughout the thoracic spine as described. 2. No pathologic fractures. 3. Central disc protrusion at T8-9 effaces the ventral CSF. 4.  Rightward disc protrusion and annular tear at T11-12 without focal stenosis. Electronically Signed   By: William Necessary M.D.   On: 01/18/2023 19:20   CT Angio Chest/Abd/Pel for Dissection W and/or Wo Contrast Result Date: 01/18/2023 CLINICAL DATA:  89 year old male with pain, weakness. Known mild aneurysmal dilatation of the ascending thoracic aorta. Bladder cancer. EXAM: CT ANGIOGRAPHY CHEST, ABDOMEN AND PELVIS TECHNIQUE: Non-contrast CT of the chest was initially obtained. Multidetector CT imaging through the chest, abdomen and pelvis was performed using the standard protocol during bolus administration of intravenous contrast. Multiplanar reconstructed images and MIPs were obtained and reviewed to evaluate the vascular anatomy. RADIATION DOSE REDUCTION: This exam was performed according to the departmental dose-optimization program which includes automated exposure control, adjustment of the mA and/or kV according to patient size and/or use of iterative reconstruction technique. CONTRAST:  75mL OMNIPAQUE  IOHEXOL  350 MG/ML SOLN COMPARISON:  CTA chest 08/02/2022 and earlier. Restaging CT Abdomen and Pelvis 12/11/2022 FINDINGS: CTA CHEST FINDINGS Cardiovascular: Previous CABG. Extensive Calcified aortic atherosclerosis. Tortuous ascending aorta is stable, smoothly tapers in the arch. Negative for thoracic aortic dissection, periaortic hematoma. Heart size is stable, within normal limits. No pericardial effusion. Little contrast in the pulmonary arteries. Proximal great vessel atherosclerosis appears stable since July. Mediastinum/Nodes: Negative for mediastinal hematoma, mass, lymphadenopathy. Lungs/Pleura: Stable lung volumes since July. Patchy lung base atelectasis or scarring has not significantly changed. No pleural effusion. Small volume new retained secretions in the trachea just below the thoracic inlet on series 4 image 30. But elsewhere the major airways are patent. No active pulmonary  inflammation is identified. Musculoskeletal: Chronic sternotomy. Chronic right 2nd rib deformity. T1 through T11 thoracic vertebrae appear stable since July. T12 compression fracture with mild comminution is new since July but was present, subtle in November when other skeletal metastatic disease was suspected. Central T12 loss of vertebral body height up to 30% (series 6, image 105). Lucency within the T12 body appears progressed since July. No retropulsion. T12 pedicles and posterior elements appear intact and aligned. There is mild anterior and lateral prevertebral soft tissue or edema which is increased since November on series 11, image 56. No epidural tumor by CT. Review of the MIP images confirms the above findings. CTA ABDOMEN AND PELVIS FINDINGS VASCULAR Aortoiliac calcified atherosclerosis. Extensive aortic and branch calcified atherosclerosis but the major arterial structures in the abdomen and pelvis remain patent. Negative for abdominal aortic dissection or aneurysm. Review of the MIP images confirms the above findings. NON-VASCULAR Hepatobiliary: Stable liver. Cholelithiasis. No gallbladder inflammation by CT. Pancreas: Diminutive. Spleen: Diminutive. Adrenals/Urinary Tract: Normal adrenal glands. Nonobstructed kidneys with symmetric renal enhancement. No hydroureter. Abnormal prostate at the base of the bladder as below. Diminutive bladder similar to the November CT. Stomach/Bowel: Decompressed large bowel from the splenic flexure distally but transverse colon is redundant with moderate retained gas and stool, increased since November. Low-density retained stool in the right colon. Cecum is on a lax mesentery. No dilated small bowel. No large bowel inflammation identified. Stomach and duodenum appear negative. No free air or free fluid. Lymphatic: No lymphadenopathy identified in the abdomen or pelvis. Reproductive: Abnormally heterogeneous, lobulated, masslike appearance of the prostate on series 10,  image 203 not significantly changed from the restaging CT last month (please see that report) Other: No pelvis free fluid. Musculoskeletal: Lumbar vertebrae appear stable since November. Medial left iliac sclerotic bone lesions have not significantly changed. Sacrum and SI joints appear intact. Pelvis and proximal femurs appear to remain intact. Review of the MIP images confirms the above findings. IMPRESSION: 1. Negative for Aortic Dissection. Stable mild fusiform aneurysmal enlargement of the ascending aorta. Severe Aortic Atherosclerosis (ICD10-I70.0). 2. Positive for a subacute and pathologic appearing T12 compression fracture, with underlying lytic lesion and loss of height progressed since November. No retropulsion or other complicating features. In conjunction with November CT findings of skeletal metastatic disease, abnormal bladder base and prostate as detailed on that exam. 3. No other acute finding in the Chest, Abdomen, or Pelvis. Cholelithiasis. Electronically Signed   By: William Hurst M.D.   On: 01/18/2023 10:37

## 2023-01-28 NOTE — Progress Notes (Signed)
 Patient left with transport to [facility]. Patient's wife informed.

## 2023-01-28 NOTE — Telephone Encounter (Signed)
 Called and spoke to patient's wife thank you for the update.  Recommend follow-up in the clinic next week.  In the meantime, he will be on bicalutamide  until received apalutamide .  She is aware of this.  We discussed potential side effects again which may include hot flashes, fatigue, muscle weakness, thyroid  dysfunction, electrolyte abnormalities which will be monitored.  Sometimes people can get rash.  Rarely dizziness, fall and seizure.  Patient has no history of seizure.  We discussed long-term cardiovascular impact on prostate cancer treatment.   Optimizing cardiovascular health is important.  She understands.  Please schedule appointment with lab, then see me on any openings on Friday 1/17.  Thank you.

## 2023-01-28 NOTE — TOC Transition Note (Signed)
 Transition of Care [facility]) - Discharge Note   Patient Details  Name: William Faulkner MRN: [id number] Date of Birth: 12-28-1934  Transition of Care [facility]) CM/SW Contact:  Alfonse JONELLE Rex, RN Phone Number: 01/28/2023, 1:02 PM   Clinical Narrative:   DC order to SNF, [facility], [place], admissions coordinator, confirmed bed available, RM 105, Call Report [phone], per teams chat from medical team-, ensure the SNF is aware of radiation treatment schedule (in EPIC) to ensure they can accommodate transportation daily. Per Benton at [facility], facility will work with spouse to provide transportation to radiation appointments, states she has been in contact with pt's spouse and radiation treatment schedule has been provided to facility conservation officer, historic buildings. PTAR for transportation after radiation treatment. No further TOC needs identified.     Final next level of care: Skilled Nursing Facility Barriers to Discharge: Barriers Resolved   Patient Goals and CMS Choice Patient states their goals for this hospitalization and ongoing recovery are:: short term rehab/SNF prior to return home CMS Medicare.gov Compare Post Acute Care list provided to:: Patient Represenative (must comment) (Bacot,Flora J (Spouse)  [phone] (Mobile)) Choice offered to / list presented to : Spouse (Koehn,Flora J (Spouse)  [phone] (Mobile))      Discharge Placement              Patient chooses bed at: [facility] Patient to be transferred to facility by: PTAR Name of family member notified: Laron, Boorman (Spouse)  [phone] (Mobile) Patient and family notified of of transfer: 01/28/23  Discharge Plan and Services Additional resources added to the After Visit Summary for   In-house Referral: Clinical Social Work   Post Acute Care Choice: Home Health          DME Arranged: N/A DME Agency: NA       HH Arranged: PT, OT HH Agency: [facility] Date [facility] Agency  Contacted: 01/20/23 Time HH Agency Contacted: 1355 Representative spoke with at [facility] Agency: Cindie  Social Drivers of Health (SDOH) Interventions SDOH Screenings   Food Insecurity: No Food Insecurity (01/18/2023)  Housing: Low Risk  (01/18/2023)  Transportation Needs: No Transportation Needs (01/18/2023)  Utilities: Not At Risk (01/18/2023)  Depression (PHQ2-9): Low Risk  (09/18/2022)  Social Connections: Moderately Isolated (01/20/2023)  Tobacco Use: Medium Risk (01/18/2023)     Readmission Risk Interventions    01/28/2023    1:00 PM  Readmission Risk Prevention Plan  Transportation Screening Complete  PCP or Specialist Appt within 5-7 Days Complete  Home Care Screening Complete  Medication Review (RN CM) Complete

## 2023-01-29 ENCOUNTER — Ambulatory Visit: Payer: Medicare Other | Admitting: Cardiology

## 2023-01-29 ENCOUNTER — Other Ambulatory Visit: Payer: Self-pay

## 2023-01-29 ENCOUNTER — Ambulatory Visit
Admission: RE | Admit: 2023-01-29 | Discharge: 2023-01-29 | Disposition: A | Discharge status: Home / Self Care | Payer: Medicare Other | Source: Ambulatory Visit | Attending: Radiation Oncology | Admitting: Radiation Oncology

## 2023-01-29 DIAGNOSIS — Z510 Encounter for antineoplastic radiation therapy: Secondary | ICD-10-CM | POA: Diagnosis present

## 2023-01-29 DIAGNOSIS — C61 Malignant neoplasm of prostate: Secondary | ICD-10-CM | POA: Diagnosis present

## 2023-01-29 DIAGNOSIS — Z79899 Other long term (current) drug therapy: Secondary | ICD-10-CM | POA: Diagnosis not present

## 2023-01-29 DIAGNOSIS — C7951 Secondary malignant neoplasm of bone: Secondary | ICD-10-CM | POA: Diagnosis present

## 2023-01-29 LAB — RAD ONC ARIA SESSION SUMMARY
Course Elapsed Days: 1
Plan Fractions Treated to Date: 2
Plan Prescribed Dose Per Fraction: 3 Gy
Plan Total Fractions Prescribed: 10
Plan Total Prescribed Dose: 30 Gy
Reference Point Dosage Given to Date: 6 Gy
Reference Point Session Dosage Given: 3 Gy
Session Number: 2

## 2023-01-30 ENCOUNTER — Other Ambulatory Visit: Payer: Self-pay

## 2023-01-30 ENCOUNTER — Ambulatory Visit
Admission: RE | Admit: 2023-01-30 | Discharge: 2023-01-30 | Disposition: A | Discharge status: Home / Self Care | Payer: Medicare Other | Source: Ambulatory Visit | Attending: Radiation Oncology | Admitting: Radiation Oncology

## 2023-01-30 ENCOUNTER — Telehealth: Payer: Self-pay

## 2023-01-30 DIAGNOSIS — C61 Malignant neoplasm of prostate: Secondary | ICD-10-CM

## 2023-01-30 DIAGNOSIS — Z510 Encounter for antineoplastic radiation therapy: Secondary | ICD-10-CM | POA: Diagnosis not present

## 2023-01-30 LAB — RAD ONC ARIA SESSION SUMMARY
Course Elapsed Days: 2
Plan Fractions Treated to Date: 3
Plan Prescribed Dose Per Fraction: 3 Gy
Plan Total Fractions Prescribed: 10
Plan Total Prescribed Dose: 30 Gy
Reference Point Dosage Given to Date: 9 Gy
Reference Point Session Dosage Given: 3 Gy
Session Number: 3

## 2023-01-30 MED ORDER — APALUTAMIDE 60 MG PO TABS: 180.0000 mg | ORAL_TABLET | Freq: Every day | ORAL | 11 refills | Status: DC

## 2023-01-30 NOTE — Telephone Encounter (Signed)
 Faxed Rx for Apalutamide to [facility] at [place] (where pt is currently residing) @ [phone].

## 2023-01-31 ENCOUNTER — Ambulatory Visit
Admission: RE | Admit: 2023-01-31 | Discharge: 2023-01-31 | Disposition: A | Discharge status: Home / Self Care | Payer: Medicare Other | Source: Ambulatory Visit | Attending: Radiation Oncology

## 2023-01-31 ENCOUNTER — Other Ambulatory Visit (HOSPITAL_COMMUNITY): Payer: Self-pay

## 2023-01-31 ENCOUNTER — Other Ambulatory Visit: Payer: Self-pay

## 2023-01-31 ENCOUNTER — Telehealth: Payer: Self-pay

## 2023-01-31 ENCOUNTER — Other Ambulatory Visit: Payer: Self-pay | Admitting: Pharmacy Technician

## 2023-01-31 DIAGNOSIS — C61 Malignant neoplasm of prostate: Secondary | ICD-10-CM

## 2023-01-31 DIAGNOSIS — Z510 Encounter for antineoplastic radiation therapy: Secondary | ICD-10-CM | POA: Diagnosis not present

## 2023-01-31 LAB — RAD ONC ARIA SESSION SUMMARY
Course Elapsed Days: 3
Plan Fractions Treated to Date: 4
Plan Prescribed Dose Per Fraction: 3 Gy
Plan Total Fractions Prescribed: 10
Plan Total Prescribed Dose: 30 Gy
Reference Point Dosage Given to Date: 12 Gy
Reference Point Session Dosage Given: 3 Gy
Session Number: 4

## 2023-01-31 MED ORDER — APALUTAMIDE 60 MG PO TABS
180.0000 mg | ORAL_TABLET | Freq: Every day | ORAL | 11 refills | Status: DC
  Filled 2023-01-31: qty 90, 30d supply, fill #0
  Filled 2023-02-28: qty 90, 30d supply, fill #1

## 2023-01-31 MED ORDER — APALUTAMIDE 60 MG PO TABS: 180.0000 mg | ORAL_TABLET | Freq: Every day | ORAL | 11 refills | Status: DC

## 2023-01-31 NOTE — Progress Notes (Signed)
 Specialty Pharmacy Initial Fill Coordination Note  William Faulkner is a 89 y.o. male contacted today regarding refills of specialty medication(s) Apalutamide  (ERLEADA ) .  Patient requested [name] at [facility] [place]  on 01/31/23   Medication will be filled on 01/31/23.   Patient is aware of $655 copayment.

## 2023-01-31 NOTE — Telephone Encounter (Signed)
 Erleada prescription changed from print back to normal so that [facility] specialty pharmacy could fill the [place].   Bethel Born, PharmD Hematology/Oncology Clinical Pharmacist [facility] [phone]

## 2023-01-31 NOTE — Telephone Encounter (Signed)
 Oral Chemotherapy Pharmacist Encounter  I spoke with patient for overview of: Erleada  (apalutamide ) for the treatment of metastatic castration sensitive prostate cancer, planned duration until disease progression or unacceptable toxicity.   Counseled patient on administration, dosing, side effects, monitoring, drug-food interactions, safe handling, storage, and disposal.  Patient will take Erleada  60mg  tablets, 3 tablets (180mg ) by mouth once daily without regard to food.  Erleada  start date: 02/01/2023  Adverse effects include but are not limited to: rash, peripheral edema, GI upset, hypertension, hot flashes, fatigue, and arthralgias.    Reviewed with patient importance of keeping a medication schedule and plan for any missed doses. No barriers to medication adherence identified. Patient is currently living in [facility] at the time and they will be administering the medication. Wife notified as well on how to administer.  Medication reconciliation performed and medication/allergy list updated.  Patient informed the pharmacy will reach out 5-7 days prior to needing next fill of Erleada  to coordinate continued medication acquisition to prevent break in therapy.  All questions answered. Patients wife voiced understanding and appreciation. Medication education handout placed in mail for patient. Patient knows to call the office with questions or concerns. Oral Chemotherapy Clinic phone number provided to patient.   Afrah Burlison, PharmD Hematology/Oncology Clinical Pharmacist [facility] [phone] 01/31/2023   9:13 AM

## 2023-01-31 NOTE — Progress Notes (Signed)
 Patient counseled in telephone encounter opened on 01/28/2023. Patient currently at [facility].   Bethel Born, PharmD Hematology/Oncology Clinical Pharmacist [facility] [phone]

## 2023-02-03 ENCOUNTER — Other Ambulatory Visit: Payer: Self-pay

## 2023-02-03 ENCOUNTER — Other Ambulatory Visit (HOSPITAL_COMMUNITY): Payer: Self-pay

## 2023-02-03 ENCOUNTER — Telehealth: Payer: Self-pay | Admitting: Pharmacy Technician

## 2023-02-03 ENCOUNTER — Ambulatory Visit
Admission: RE | Admit: 2023-02-03 | Discharge: 2023-02-03 | Disposition: A | Discharge status: Home / Self Care | Payer: Medicare Other | Source: Ambulatory Visit | Attending: Radiation Oncology

## 2023-02-03 DIAGNOSIS — Z510 Encounter for antineoplastic radiation therapy: Secondary | ICD-10-CM | POA: Diagnosis not present

## 2023-02-03 LAB — RAD ONC ARIA SESSION SUMMARY
Course Elapsed Days: 6
Plan Fractions Treated to Date: 5
Plan Prescribed Dose Per Fraction: 3 Gy
Plan Total Fractions Prescribed: 10
Plan Total Prescribed Dose: 30 Gy
Reference Point Dosage Given to Date: 15 Gy
Reference Point Session Dosage Given: 3 Gy
Session Number: 5

## 2023-02-03 NOTE — Telephone Encounter (Signed)
 Oral Oncology Patient Advocate Encounter  Was successful in securing patient a $8,000 grant from [facility] to provide copayment coverage for Erleada .  This will keep the out of pocket expense at $0.     [employer] ID: [id number]  I have spoken with the patient's wife.   The billing information is as follows and has been shared with [facility].    RxBin: [id number] PCN: [id number] Member ID: [id number] Group ID: [id number] Dates of Eligibility: 01/04/23 through 01/03/24  Fund:  Prostate  Estefana Moellers, CPhT-Adv Oncology Pharmacy Patient Advocate [facility] Direct Number: [phone]  Fax: [phone]

## 2023-02-04 ENCOUNTER — Ambulatory Visit
Admission: RE | Admit: 2023-02-04 | Discharge: 2023-02-04 | Disposition: A | Discharge status: Home / Self Care | Payer: Medicare Other | Source: Ambulatory Visit | Attending: Radiation Oncology | Admitting: Radiation Oncology

## 2023-02-04 ENCOUNTER — Other Ambulatory Visit: Payer: Self-pay

## 2023-02-04 DIAGNOSIS — Z510 Encounter for antineoplastic radiation therapy: Secondary | ICD-10-CM | POA: Diagnosis not present

## 2023-02-04 LAB — RAD ONC ARIA SESSION SUMMARY
Course Elapsed Days: 7
Plan Fractions Treated to Date: 6
Plan Prescribed Dose Per Fraction: 3 Gy
Plan Total Fractions Prescribed: 10
Plan Total Prescribed Dose: 30 Gy
Reference Point Dosage Given to Date: 18 Gy
Reference Point Session Dosage Given: 3 Gy
Session Number: 6

## 2023-02-04 NOTE — Progress Notes (Signed)
 Palliative Medicine [facility]  [phone] Fax:([phone]) [phone]   Name: William Faulkner Date: 02/04/2023 MRN: [id number]  DOB: 1934/09/08  Patient Care Team: Ezell Hollow, MD as PCP - General (Internal Medicine) Audery Blazing Deannie Fabian, MD as PCP - Cardiology (Cardiology) Audery Blazing Deannie Fabian, MD as Consulting Physician (Cardiology) Winston Hawking, MD as Consulting Physician (Orthopedic Surgery) Adelaide Adjutant, MD as Consulting Physician (Physical Medicine and Rehabilitation) Corie Diamond, MD as Consulting Physician (Ophthalmology) Lahoma Pigg, MD as Consulting Physician (Urology) Katheleen Palmer, RN as Oncology Nurse Navigator    REASON FOR CONSULTATION: William Faulkner is a 89 y.o. male with oncologic medical history including prostate cancer (12/2022) with metastatic disease to the bone. Palliative ask to see for symptom management and goals of care.    SOCIAL HISTORY:     reports that he quit smoking about 39 years ago. His smoking use included cigarettes. He started smoking about 54 years ago. He has a 4.5 pack-year smoking history. He has never used smokeless tobacco. He reports that he does not drink alcohol and does not use drugs.  ADVANCE DIRECTIVES:  None on file  CODE STATUS: Full code  PAST MEDICAL HISTORY: Past Medical History:  Diagnosis Date   AORTIC ANEUR UNSPEC SITE WITHOUT MENTION RUPTURE    AORTIC INSUFFICIENCY    BPH (benign prostatic hyperplasia)    CAD, ARTERY BYPASS GRAFT    Cancer (HCC)    CEREBROVASCULAR DISEASE    DIAB W/O COMP TYPE II/UNS NOT STATED UNCNTRL    Hyperlipidemia    HYPERTENSION, ESSENTIAL NOS    NEOPLASM, MALIGNANT, BLADDER, HX OF    Rotator cuff tear    Left shoulder,     PAST SURGICAL HISTORY:  Past Surgical History:  Procedure Laterality Date   BACK SURGERY  2003   lower   BLADDER TUMOR EXCISION     Surgery x6, Dr.Tannebaum   BLEPHAROPLASTY  1980   bilaterally, Dr Gennie Kicks   COLONOSCOPY W/  POLYPECTOMY     Dr [facility]   CORONARY ARTERY BYPASS GRAFT  2011   x 5   CYSTOSCOPY  09/02/2012   INGUINAL HERNIA REPAIR  10/24/2011   Procedure: HERNIA REPAIR INGUINAL ADULT;  Surgeon: Keitha Pata, MD;  Location: [facility];  Service: General;  Laterality: Right;  Repair Right Inguinal Hernia with Mesh, Umbilical Hernia Repair with Mesh   IR FLUORO GUIDED NEEDLE PLC ASPIRATION/INJECTION LOC  01/21/2023   LUMBAR EPIDURAL INJECTION  2008   ROTATOR CUFF REPAIR  2009   right; Dr Mevelyn Acton   SHOULDER ARTHROSCOPY WITH ROTATOR CUFF REPAIR AND SUBACROMIAL DECOMPRESSION  02/14/2014   Right, SAD/DCR, biceps tenodesis   UMBILICAL HERNIA REPAIR  10/24/2011   Procedure: HERNIA REPAIR UMBILICAL ADULT;  Surgeon: Keitha Pata, MD;  Location: [facility];  Service: General;  Laterality: N/A;    HEMATOLOGY/ONCOLOGY HISTORY:  Oncology History  Prostate cancer (HCC)  01/21/2023 Initial Diagnosis   Prostate cancer (HCC)   01/27/2023 Cancer Staging   Staging form: Prostate, AJCC 8th Edition - Clinical stage from 01/27/2023: Stage IVB (cTX, cNX, pM1b, PSA: 261) - Signed by Lowanda Ruddy, MD on 01/27/2023 Stage prefix: Initial diagnosis Prostate specific antigen (PSA) range: 20 or greater     ALLERGIES:  is allergic to atorvastatin, celecoxib, sulfonamide derivatives, and zolpidem tartrate.  MEDICATIONS:  Current Outpatient Medications  Medication Sig Dispense Refill   apalutamide  (ERLEADA ) 60 MG tablet Take 3 tablets (180 mg total) by mouth daily.  May discontinue bicalutamide  when starts apalutamide  90 tablet 11   aspirin  81 MG tablet Take 81 mg by mouth daily.     augmented betamethasone  dipropionate (DIPROLENE -AF) 0.05 % cream Apply topically 2 (two) times daily. 60 g 0   cholecalciferol  (VITAMIN D ) 1000 UNITS tablet Take 1,000 Units by mouth daily.     Cyanocobalamin  ([employer] VITAMIN B-12) 5000 MCG SUBL Take 5,000 mcg by mouth daily. (Patient not taking: Reported on 09/18/2022)     dexamethasone   (DECADRON ) 2 MG tablet Take 1 tablet (2 mg total) by mouth 2 (two) times daily with a meal.     feeding supplement (ENSURE ENLIVE / ENSURE PLUS) LIQD Take 237 mLs by mouth 2 (two) times daily between meals. 14220 mL 0   gabapentin  (NEURONTIN ) 300 MG capsule Take 1 capsule (300 mg total) by mouth 2 (two) times daily.     insulin  aspart (NOVOLOG ) 100 UNIT/ML injection Inject 0-9 Units into the skin 3 (three) times daily with meals. Sliding scale CBG 70 - 120: 0 units CBG 121 - 150: 1 unit,  CBG 151 - 200: 2 units,  CBG 201 - 250: 3 units,  CBG 251 - 300: 5 units,  CBG 301 - 350: 7 units,  CBG 351 - 400: 9 units   CBG > 400: 9 units and notify your MD     ketoconazole  (NIZORAL ) 2 % cream Apply 1 application topically daily. 30 g 0   nystatin  (MYCOSTATIN /NYSTOP ) powder Apply topically 3 (three) times daily. Apply to affected area.     pantoprazole  (PROTONIX ) 40 MG tablet Take 1 tablet (40 mg total) by mouth daily at 6 (six) AM.     polyethylene glycol powder (MIRALAX ) 17 GM/SCOOP powder Take 17 g by mouth daily. For constipation.     propranolol  (INDERAL ) 40 MG tablet TAKE 1 TABLET BY MOUTH DAILY (Patient taking differently: Take 40 mg by mouth daily.) 90 tablet 0   rosuvastatin  (CRESTOR ) 20 MG tablet TAKE 1 TABLET BY MOUTH DAILY (Patient taking differently: Take 20 mg by mouth daily.) 90 tablet 3   senna-docusate (SENOKOT-S) 8.6-50 MG tablet Take 1 tablet by mouth 2 (two) times daily between meals as needed for mild constipation.     timolol  (TIMOPTIC ) 0.5 % ophthalmic solution Place 1 drop into both eyes every morning. As directed     No current facility-administered medications for this visit.    VITAL SIGNS: There were no vitals taken for this visit. There were no vitals filed for this visit.  Estimated body mass index is 23.62 kg/m as calculated from the following:   Height as of 01/18/23: 6\' 2"  (1.88 m).   Weight as of 01/18/23: 184 lb (83.5 kg).  LABS: CBC:    Component Value Date/Time    WBC 6.8 01/27/2023 0930   HGB 14.7 01/27/2023 0930   HCT 45.8 01/27/2023 0930   PLT 456 (H) 01/27/2023 0930   MCV 89.8 01/27/2023 0930   NEUTROABS 5.9 01/18/2023 0822   LYMPHSABS 1.1 01/18/2023 0822   MONOABS 0.5 01/18/2023 0822   EOSABS 0.1 01/18/2023 0822   BASOSABS 0.0 01/18/2023 0822   Comprehensive Metabolic Panel:    Component Value Date/Time   NA 137 01/27/2023 0930   NA 140 01/08/2022 0911   K 4.4 01/27/2023 0930   CL 100 01/27/2023 0930   CO2 26 01/27/2023 0930   BUN 25 (H) 01/27/2023 0930   BUN 33 (H) 01/08/2022 0911   CREATININE 1.12 01/27/2023 0930   CREATININE 1.09 10/30/2015  1211   GLUCOSE 136 (H) 01/27/2023 0930   CALCIUM  10.1 01/27/2023 0930   AST 24 01/27/2023 0930   ALT 27 01/27/2023 0930   ALKPHOS 100 01/27/2023 0930   BILITOT 0.8 01/27/2023 0930   BILITOT 0.3 01/08/2022 0911   PROT 7.9 01/27/2023 0930   PROT 6.5 01/08/2022 0911   ALBUMIN 3.5 01/27/2023 0930   ALBUMIN 4.2 01/08/2022 0911    RADIOGRAPHIC STUDIES: DG Bone Survey Met Result Date: 01/20/2023 CLINICAL DATA:  Lytic bone lesions on x-ray. Multiple metastatic deposits throughout the thoracic spine. EXAM: METASTATIC BONE SURVEY COMPARISON:  Thoracic spine MRI 01/18/2023. Chest abdomen pelvis CT 01/18/2023. FINDINGS: Patient with known lesions in the thoracic and lumbar spine. This was better assessed on recent CT and MRI. The majority of these lesions are not well-defined by radiograph. The known T12 compression fracture is not well assessed on the current exam due to osseous overlap. The known left iliac sclerotic lesion is not well seen by radiograph. Area of sclerosis projects over the lateral aspect of the right distal tibia at the ankle joint. Other there are extremity lesion is seen by radiograph. Prior median sternotomy. Blunting of the left costophrenic angle, likely epicardial fat pad and atelectasis. Aortic atherosclerosis and vascular calcifications. IMPRESSION: 1. Patient with known  spinal osseous bone lesions. This was better assessed on recent CT and MRI. The majority of these lesions are not well-defined by radiograph. 2. The known T12 compression fracture is not well assessed on the current exam due to osseous overlap. 3. The known left iliac sclerotic lesion is not well seen by radiograph. 4. Area of sclerosis projects over the lateral aspect of the right distal tibia at the ankle joint, indeterminate. Electronically Signed   By: Chadwick Colonel M.D.   On: 01/20/2023 22:50   US  RENAL Result Date: 01/20/2023 CLINICAL DATA:  Acute kidney injury EXAM: RENAL / URINARY TRACT ULTRASOUND COMPLETE COMPARISON:  CT 01/18/2023 FINDINGS: Right Kidney: Renal measurements: 9.1 x 4.7 x 4.4 cm = volume: 9.5 mL. Echogenicity within normal limits. No mass or hydronephrosis visualized. Left Kidney: Renal measurements: 11.9 x 5.3 x 5 cm = volume: 165 mL. Echogenicity within normal limits. No mass or hydronephrosis visualized. Bladder: Appears normal for degree of bladder distention. Other: Prostate enlargement at least 5.7 x 5.3 cm. IMPRESSION: 1. Negative for hydronephrosis. 2. Prostate enlargement. Electronically Signed   By: Nicoletta Barrier M.D.   On: 01/20/2023 16:00   MR THORACIC SPINE WO CONTRAST Result Date: 01/18/2023 CLINICAL DATA:  Bladder cancer. Evaluate for metastatic disease to the thoracic spine. Chronic back pain. Weakness beginning approximately 1 month ago. EXAM: MRI THORACIC SPINE WITHOUT CONTRAST TECHNIQUE: Multiplanar, multisequence MR imaging of the thoracic spine was performed. No intravenous contrast was administered. COMPARISON:  CT angio chest, abdomen and pelvis 01/18/2023. FINDINGS: Alignment: No significant listhesis is present. Mild rightward curvature is centered at T8-9. Thoracic kyphosis is intact. Vertebrae: Multiple metastatic deposits are present throughout the thoracic spine. A 6 mm lesion is present within the right pedicle of T1. An 8 mm lesion is present posterior  elements of T2. Anterior lesion at T2 measures 13 mm. Metastatic lesions are present within the T3 vertebral body and the spinous process of T3. A right superior lesion at T5 extends to the pedicle. Rounded metastases at T7 measure 10 and 11 mm. Larger metastases at T8 extend from the superior to inferior endplate, 18 mm. Similar larger lesions are present throughout the remainder of the lower thoracic spine. A  right T11 lesion extends into the right pedicle. Diffuse metastatic infiltration is present in the T12 vertebral body. No pathologic fractures are present. Extensive infiltration of the L1 vertebral body is present. Cord:  Normal signal and morphology. Paraspinal and other soft tissues: See CT report from the same day for further description of metastatic disease outside the spine. Disc levels: A central disc protrusion at T8-9 effaces the ventral CSF. The foramina are patent bilaterally. No significant disc disease or stenosis is present above this level. T9-10: Negative. T10-11: Negative. T11-12: A rightward disc protrusion and annular tear is present without focal stenosis. T12-L1: Negative. IMPRESSION: 1. Multiple metastatic deposits throughout the thoracic spine as described. 2. No pathologic fractures. 3. Central disc protrusion at T8-9 effaces the ventral CSF. 4. Rightward disc protrusion and annular tear at T11-12 without focal stenosis. Electronically Signed   By: Audree Leas M.D.   On: 01/18/2023 19:20   PERFORMANCE STATUS (ECOG) : 1 - Symptomatic but completely ambulatory  Review of Systems  Constitutional:  Positive for activity change and fatigue.  Musculoskeletal:  Positive for back pain.  Unless otherwise noted, a complete review of systems is negative.  Physical Exam General: NAD, in wheelchair  Cardiovascular: regular rate and rhythm Pulmonary: clear ant fields Abdomen: soft, nontender, + bowel sounds Extremities: no edema, no joint deformities, TLSO brace in place   Skin: no rashes Neurological: Alert and oriented x3  IMPRESSION:  This is my initial clinic visit with Mr. Norsworthy. He was initially seen by our team during recent hospitalization. Patient is in a wheelchair. TLSO brace in place. His wife and son are present. He is alert and able to engage in discussions appropriately.   I introduced myself, Maygan RN, and Palliative's role in collaboration with the oncology team. Concept of Palliative Care was introduced as specialized medical care for people and their families living with serious illness.  It focuses on providing relief from the symptoms and stress of a serious illness.  The goal is to improve quality of life for both the patient and the family. Values and goals of care important to patient and family were attempted to be elicited.   Mr. Bethard is a retired from [employer]. Lives in the home with his wife of more than 65 years and has been residing in [place]  for approximately 50 years after relocating from [place]. Prior to recent illness and diagnosis, he struggled with pain however was actively driving and performing all ADLs. Since recent hospitalization he was discharged to [facility] rehab. He has been undergoing radiation treatment for an unspecified condition, with physical therapy incorporated into his care plan. The patient has shown a positive attitude towards the physical therapy, actively participating in the exercises even outside of the scheduled sessions. Despite this, he expressed a strong desire to return home, feeling that the therapy could be effectively continued there.  Mr. Ashabranner reports that he has been experiencing pain, initially rated as a 4 on a scale of 0 to 10, located just above his right hip and lower back. This pain has been ongoing for approximately two years and was described as more acute three weeks prior to the consultation. At the time of the consultation, the patient rated his pain as a 3,  describing it as a numb/aching sensation. He also suffers from neuropathy.   We discussed his pain regimen at length. He is currently taking dexamethasone , a steroid for pain and inflammation, daily, extra strength Tylenol , and gabapentin , which  he has been taking for years. We discussed use of Tylenol  every 8 hours to assist with his pain and the role of physical therapy and radiation therapy. Education provided on palliative's role in collaboration with his Oncologist in managing his symptoms specifically pain. We discussed realistic expectations as Mr. Donais states he would appreciate having zero pain. I discussed focusing on short term goals with understanding his pain may not completely resolve however if we can get to a manageable and comfortable state this is the quality of life that is most important to him. The patient reports pain scale of 4 currently with reasonable plan over the next months to shoot for achieving pain goal of 2 on 0 to 10 scale.   We discussed his current illness and what it means in the larger context of his on-going co-morbidities. Natural disease trajectory and expectations were discussed. Mr. Boike and his family are realistic in their understanding of his current cancer condition and plan of care.  He is taking oral He has also started a new medication, Erleada , which he takes three pills of daily. The patient expressed a belief that the medication is his primary hope for improvement, viewing the physical therapy as secondary. He is clear in his expressed goals to continue to treat the treatable allowing him every opportunity to continue thriving. If at any point he shows no improvement, further decline, or does not tolerate treatments he would wish to create an atmosphere that is quality focus with no prolongation.  The patient's overall goal is to improve his quality of life and regain some of his previous physical capabilities understanding he has a new baseline  with limitations.   All questions answered and support provided.  I discussed the importance of continued conversation with family and their medical providers regarding overall plan of care and treatment options, ensuring decisions are within the context of the patients values and GOCs.  PLAN: Established therapeutic relationship. Education provided on palliative's role in collaboration with their Oncology/Radiation team.  Cancer-related Pain Pain localized above the right hip, rated as 4/10 at worst in the past week and 3/10 currently. Pain is managed with dexamethasone , extra strength Tylenol , and gabapentin . The goal is to reduce pain to a level of 1-2/10. -Continue current pain management regimen. -Continue Tylenol  ES every 8 hours as needed -Continue Gabapentin  300mg  twice daily -Continue dexamethasone  as prescribed. -Communicate any changes in pain levels to the medical team promptly.  Physical Therapy Patient is currently undergoing physical therapy sessions to improve strength and mobility. -Continue with physical therapy sessions. -Encourage patient to continue with exercises at home and outside of scheduled sessions. He is anxiously awaiting to return home. Discussed what care would look like at home after discharging from SNF. Family anticipating disciplinary meetings at [facility] in the next week to establish plan of care focusing on transition him back to his home with support.   Cancer Treatment Patient is currently undergoing radiation therapy and taking Erleada . -Continue with radiation therapy and Erleada  as scheduled and managed by Dr. Alita Irwin and Dr. Lorri Rota.   Follow-up Plan for a follow-up appointment in approximately 4-6 weeks. If any changes occur in the meantime, patient is encouraged to contact the medical team.   Patient expressed understanding and was in agreement with this plan. He also understands that He can call the clinic at any time with any questions,  concerns, or complaints.   Thank you for your referral and allowing Palliative to assist in Mr. Pearson J  Shiroma's care.   Number and complexity of problems addressed: HIGH - 1 or more chronic illnesses with SEVERE exacerbation, progression, or side effects of treatment - advanced cancer, pain. Any controlled substances utilized were prescribed in the context of palliative care.   Visit consisted of counseling and education dealing with the complex and emotionally intense issues of symptom management and palliative care in the setting of serious and potentially life-threatening illness.  Signed by: Dellia Ferguson, AGPCNP-BC Palliative Medicine Team/[facility]

## 2023-02-04 NOTE — Progress Notes (Signed)
 Request sent for patient to follow up with Dr. Cherly Hensen and start of Lupron.  Patient will started Erleada on 1/11.    RN will continue to follow.

## 2023-02-05 ENCOUNTER — Other Ambulatory Visit: Payer: Self-pay

## 2023-02-05 ENCOUNTER — Encounter: Payer: Self-pay | Admitting: Nurse Practitioner

## 2023-02-05 ENCOUNTER — Telehealth: Payer: Self-pay

## 2023-02-05 ENCOUNTER — Ambulatory Visit
Admission: RE | Admit: 2023-02-05 | Discharge: 2023-02-05 | Disposition: A | Discharge status: Home / Self Care | Payer: Medicare Other | Source: Ambulatory Visit | Attending: Radiation Oncology | Admitting: Radiation Oncology

## 2023-02-05 ENCOUNTER — Inpatient Hospital Stay: Payer: Medicare Other | Attending: Internal Medicine | Admitting: Nurse Practitioner

## 2023-02-05 VITALS — BP 143/76 | HR 60 | Resp 16 | Ht 74.0 in

## 2023-02-05 DIAGNOSIS — G893 Neoplasm related pain (acute) (chronic): Secondary | ICD-10-CM | POA: Diagnosis not present

## 2023-02-05 DIAGNOSIS — Z79899 Other long term (current) drug therapy: Secondary | ICD-10-CM | POA: Insufficient documentation

## 2023-02-05 DIAGNOSIS — Z515 Encounter for palliative care: Secondary | ICD-10-CM | POA: Diagnosis not present

## 2023-02-05 DIAGNOSIS — C61 Malignant neoplasm of prostate: Secondary | ICD-10-CM | POA: Insufficient documentation

## 2023-02-05 DIAGNOSIS — Z510 Encounter for antineoplastic radiation therapy: Secondary | ICD-10-CM | POA: Insufficient documentation

## 2023-02-05 DIAGNOSIS — M792 Neuralgia and neuritis, unspecified: Secondary | ICD-10-CM | POA: Diagnosis not present

## 2023-02-05 DIAGNOSIS — C7951 Secondary malignant neoplasm of bone: Secondary | ICD-10-CM | POA: Insufficient documentation

## 2023-02-05 DIAGNOSIS — Z7189 Other specified counseling: Secondary | ICD-10-CM | POA: Diagnosis not present

## 2023-02-05 DIAGNOSIS — R530 Neoplastic (malignant) related fatigue: Secondary | ICD-10-CM

## 2023-02-05 LAB — RAD ONC ARIA SESSION SUMMARY
Course Elapsed Days: 8
Plan Fractions Treated to Date: 7
Plan Prescribed Dose Per Fraction: 3 Gy
Plan Total Fractions Prescribed: 10
Plan Total Prescribed Dose: 30 Gy
Reference Point Dosage Given to Date: 21 Gy
Reference Point Session Dosage Given: 3 Gy
Session Number: 7

## 2023-02-06 ENCOUNTER — Ambulatory Visit
Admission: RE | Admit: 2023-02-06 | Discharge: 2023-02-06 | Disposition: A | Discharge status: Home / Self Care | Payer: Medicare Other | Source: Ambulatory Visit | Attending: Radiation Oncology | Admitting: Radiation Oncology

## 2023-02-06 ENCOUNTER — Other Ambulatory Visit: Payer: Self-pay

## 2023-02-06 DIAGNOSIS — Z510 Encounter for antineoplastic radiation therapy: Secondary | ICD-10-CM | POA: Diagnosis not present

## 2023-02-06 LAB — RAD ONC ARIA SESSION SUMMARY
Course Elapsed Days: 9
Plan Fractions Treated to Date: 8
Plan Prescribed Dose Per Fraction: 3 Gy
Plan Total Fractions Prescribed: 10
Plan Total Prescribed Dose: 30 Gy
Reference Point Dosage Given to Date: 24 Gy
Reference Point Session Dosage Given: 3 Gy
Session Number: 8

## 2023-02-07 ENCOUNTER — Encounter: Payer: Medicare Other | Admitting: Dietician

## 2023-02-07 ENCOUNTER — Ambulatory Visit: Payer: Medicare Other

## 2023-02-07 ENCOUNTER — Other Ambulatory Visit: Payer: Self-pay

## 2023-02-07 ENCOUNTER — Ambulatory Visit
Admission: RE | Admit: 2023-02-07 | Discharge: 2023-02-07 | Disposition: A | Discharge status: Home / Self Care | Payer: Medicare Other | Source: Ambulatory Visit | Attending: Radiation Oncology | Admitting: Radiation Oncology

## 2023-02-07 ENCOUNTER — Inpatient Hospital Stay: Payer: Medicare Other | Admitting: Dietician

## 2023-02-07 DIAGNOSIS — Z510 Encounter for antineoplastic radiation therapy: Secondary | ICD-10-CM | POA: Diagnosis not present

## 2023-02-07 DIAGNOSIS — C61 Malignant neoplasm of prostate: Secondary | ICD-10-CM

## 2023-02-07 DIAGNOSIS — Z9189 Other specified personal risk factors, not elsewhere classified: Secondary | ICD-10-CM | POA: Insufficient documentation

## 2023-02-07 LAB — RAD ONC ARIA SESSION SUMMARY
Course Elapsed Days: 10
Plan Fractions Treated to Date: 9
Plan Prescribed Dose Per Fraction: 3 Gy
Plan Total Fractions Prescribed: 10
Plan Total Prescribed Dose: 30 Gy
Reference Point Dosage Given to Date: 27 Gy
Reference Point Session Dosage Given: 3 Gy
Session Number: 9

## 2023-02-07 NOTE — Progress Notes (Deleted)
[facility] CONSULT NOTE  Patient Care Team: Wanda Plump, MD as PCP - General (Internal Medicine) [name] Madolyn Frieze, MD as PCP - Cardiology (Cardiology) [name] Madolyn Frieze, MD as Consulting Physician (Cardiology) Beverely Low, MD as Consulting Physician (Orthopedic Surgery) Sheran Luz, MD as Consulting Physician (Physical Medicine and Rehabilitation) Nelson Chimes, MD as Consulting Physician (Ophthalmology) Jannifer Hick, MD as Consulting Physician (Urology) Cherlyn Cushing, RN as Oncology Nurse Navigator Pickenpack-Cousar, Arty Baumgartner, NP as Nurse Practitioner [facility])  ASSESSMENT & PLAN:  [name] is a 89 y.o.male with history of CAD, CVA, hypertension, hyperlipidemia, BPH [facility].  Current diagnosis: mHSPC with diffuse bone metastases Initial diagnosis: mHSPC iPSA 261 Germline testing: Will referr Somatic testing: Not yet Treatment: ADT/apa  The patient was counseled on the natural history of prostate cancer and the standard treatment options that are available for prostate cancer.   Prostate cancer (HCC) Started apalutamide on Start ADT in 4 weeks. CBC, CMP, PSA and testosterone in 4 weeks  At risk for side effect of medication Supportive baseline bone mineral density study and then every 2 years calcium (1000-1200 mg daily from food and supplements) and vitamin D3 (1000 IU daily) Zometa (5 mg IV annually) for osteopenia (T-score between -1.0 and -2.5) on ADT after dental clearance. If CRPC, 4 mg every 3 months if having bone metastases. Control and prevent diabetes Aggressive cardiovascular risk management Weight-bearing exercises (30 minutes per day) Limit alcohol consumption and avoid smoking   No orders of the defined types were placed in this encounter.    The total time spent in the appointment was {CHL ONC TIME VISIT - [id number]} encounter with patients including review  of chart and various tests results, discussions about plan of care and coordination of care plan  All questions were answered. The patient knows to call the clinic with any problems, questions or concerns. No barriers to learning was detected.  Melven Sartorius, MD 1/17/20252:39 PM  CHIEF COMPLAINTS/PURPOSE OF CONSULTATION:  Prostate cancer  HISTORY OF PRESENTING ILLNESS:  William Faulkner 89 y.o. male is here because of prostate cancer. I have reviewed his chart and materials related to his cancer extensively and collaborated history with the patient. Summary of oncologic history is as follows: Oncology History  Prostate cancer (HCC)  01/18/2023 Imaging   CTA CAP 1. Negative for Aortic Dissection. Stable mild fusiform aneurysmal enlargement of the ascending aorta. Severe Aortic Atherosclerosis (ICD10-I70.0).   2. Positive for a subacute and pathologic appearing T12 compression fracture, with underlying lytic lesion and loss of height progressed since November. No retropulsion or other complicating features. In conjunction with November CT findings of skeletal metastatic disease, abnormal bladder base and prostate as detailed on that exam.   3. No other acute finding in the Chest, Abdomen, or Pelvis. Cholelithiasis.   01/20/2023 Imaging   Bone survey IMPRESSION: 1. Patient with known spinal osseous bone lesions. This was better assessed on recent CT and MRI. The majority of these lesions are not well-defined by radiograph. 2. The known T12 compression fracture is not well assessed on the current exam due to osseous overlap. 3. The known left iliac sclerotic lesion is not well seen by radiograph. 4. Area of sclerosis projects over the lateral aspect of the right distal tibia at the ankle joint, indeterminate.   01/21/2023 Initial Diagnosis   Prostate cancer (HCC)   01/21/2023 Pathology Results   BONE, T12, BIOPSY:  -  Metastatic carcinoma to bone, consistent with a  primary prostatic    01/27/2023 Cancer Staging   Staging form: Prostate, AJCC 8th Edition - Clinical stage from 01/27/2023: Stage IVB (cTX, cNX, pM1b, PSA: 261) - Signed by Melven Sartorius, MD on 01/27/2023 Stage prefix: Initial diagnosis Prostate specific antigen (PSA) range: 20 or greater     MEDICAL HISTORY:  Past Medical History:  Diagnosis Date   AORTIC ANEUR UNSPEC SITE WITHOUT MENTION RUPTURE    AORTIC INSUFFICIENCY    BPH (benign prostatic hyperplasia)    CAD, ARTERY BYPASS GRAFT    Cancer (HCC)    CEREBROVASCULAR DISEASE    DIAB W/O COMP TYPE II/UNS NOT STATED UNCNTRL    Hyperlipidemia    HYPERTENSION, ESSENTIAL NOS    NEOPLASM, MALIGNANT, BLADDER, HX OF    Rotator cuff tear    Left shoulder,     SURGICAL HISTORY: Past Surgical History:  Procedure Laterality Date   BACK SURGERY  2003   lower   BLADDER TUMOR EXCISION     Surgery x6, Dr.Tannebaum   BLEPHAROPLASTY  1980   bilaterally, Dr Nile Riggs   COLONOSCOPY W/ POLYPECTOMY     Dr [facility]   CORONARY ARTERY BYPASS GRAFT  2011   x 5   CYSTOSCOPY  09/02/2012   INGUINAL HERNIA REPAIR  10/24/2011   Procedure: HERNIA REPAIR INGUINAL ADULT;  Surgeon: Velora Heckler, MD;  Location: [facility];  Service: General;  Laterality: Right;  Repair Right Inguinal Hernia with Mesh, Umbilical Hernia Repair with Mesh   IR FLUORO GUIDED NEEDLE PLC ASPIRATION/INJECTION LOC  01/21/2023   LUMBAR EPIDURAL INJECTION  2008   ROTATOR CUFF REPAIR  2009   right; Dr Simonne Come   SHOULDER ARTHROSCOPY WITH ROTATOR CUFF REPAIR AND SUBACROMIAL DECOMPRESSION  02/14/2014   Right, SAD/DCR, biceps tenodesis   UMBILICAL HERNIA REPAIR  10/24/2011   Procedure: HERNIA REPAIR UMBILICAL ADULT;  Surgeon: Velora Heckler, MD;  Location: [facility];  Service: General;  Laterality: N/A;    SOCIAL HISTORY: Social History   Socioeconomic History   Marital status: Married    Spouse name: Not on file   Number of children: 3   Years of education: Not on file    Highest education level: Not on file  Occupational History   Occupation: retired  Tobacco Use   Smoking status: Former    Current packs/day: 0.00    Average packs/day: 0.3 packs/day for 15.0 years (4.5 ttl pk-yrs)    Types: Cigarettes    Start date: 01/21/1969    Quit date: 01/22/1984    Years since quitting: 39.0   Smokeless tobacco: Never  Vaping Use   Vaping status: Never Used  Substance and Sexual Activity   Alcohol use: No    Alcohol/week: 0.0 standard drinks of alcohol    Comment: rare, wine    Drug use: No   Sexual activity: Not on file  Other Topics Concern   Not on file  Social History Narrative   8 G-children   Married x 64 years   Lives with wife in a one story home.  Has 3 children.     Retired from [employer].  Education: college.    Right handed   Drinks caffeine   One story home   Social Drivers of Health   Financial Resource Strain: Not on file  Food Insecurity: No Food Insecurity (01/18/2023)   Hunger Vital Sign    Worried About Running Out of Food in the Last Year: Never  true    Ran Out of Food in the Last Year: Never true  Transportation Needs: No Transportation Needs (01/18/2023)   PRAPARE - Administrator, Civil Service (Medical): No    Lack of Transportation (Non-Medical): No  Physical Activity: Not on file  Stress: Not on file  Social Connections: Moderately Isolated (01/20/2023)   Social Connection and Isolation Panel [NHANES]    Frequency of Communication with Friends and Family: More than three times a week    Frequency of Social Gatherings with Friends and Family: More than three times a week    Attends Religious Services: Never    Database administrator or Organizations: No    Attends Banker Meetings: Never    Marital Status: Married  Catering manager Violence: Not At Risk (01/18/2023)   Humiliation, Afraid, Rape, and Kick questionnaire    Fear of Current or Ex-Partner: No    Emotionally Abused: No    Physically  Abused: No    Sexually Abused: No    FAMILY HISTORY: Family History  Problem Relation Age of Onset   Diabetes Mother    Tremor Father    Other Sister    Stroke Neg Hx    Heart disease Neg Hx    Cancer Neg Hx     ALLERGIES:  is allergic to atorvastatin, celecoxib, sulfonamide derivatives, and zolpidem tartrate.  MEDICATIONS:  Current Outpatient Medications  Medication Sig Dispense Refill   apalutamide (ERLEADA) 60 MG tablet Take 3 tablets (180 mg total) by mouth daily. May discontinue bicalutamide when starts apalutamide 90 tablet 11   aspirin 81 MG tablet Take 81 mg by mouth daily.     augmented betamethasone dipropionate (DIPROLENE-AF) 0.05 % cream Apply topically 2 (two) times daily. 60 g 0   cholecalciferol (VITAMIN D) 1000 UNITS tablet Take 1,000 Units by mouth daily.     Cyanocobalamin ([employer] VITAMIN B-12) 5000 MCG SUBL Take 5,000 mcg by mouth daily. (Patient not taking: Reported on 09/18/2022)     dexamethasone (DECADRON) 2 MG tablet Take 1 tablet (2 mg total) by mouth 2 (two) times daily with a meal.     feeding supplement (ENSURE ENLIVE / ENSURE PLUS) LIQD Take 237 mLs by mouth 2 (two) times daily between meals. 14220 mL 0   gabapentin (NEURONTIN) 300 MG capsule Take 1 capsule (300 mg total) by mouth 2 (two) times daily.     insulin aspart (NOVOLOG) 100 UNIT/ML injection Inject 0-9 Units into the skin 3 (three) times daily with meals. Sliding scale CBG 70 - 120: 0 units CBG 121 - 150: 1 unit,  CBG 151 - 200: 2 units,  CBG 201 - 250: 3 units,  CBG 251 - 300: 5 units,  CBG 301 - 350: 7 units,  CBG 351 - 400: 9 units   CBG > 400: 9 units and notify your MD     ketoconazole (NIZORAL) 2 % cream Apply 1 application topically daily. 30 g 0   nystatin (MYCOSTATIN/NYSTOP) powder Apply topically 3 (three) times daily. Apply to affected area.     pantoprazole (PROTONIX) 40 MG tablet Take 1 tablet (40 mg total) by mouth daily at 6 (six) AM.     polyethylene glycol powder (MIRALAX) 17  GM/SCOOP powder Take 17 g by mouth daily. For constipation.     propranolol (INDERAL) 40 MG tablet TAKE 1 TABLET BY MOUTH DAILY (Patient taking differently: Take 40 mg by mouth daily.) 90 tablet 0   rosuvastatin (CRESTOR) 20  MG tablet TAKE 1 TABLET BY MOUTH DAILY (Patient taking differently: Take 20 mg by mouth daily.) 90 tablet 3   senna-docusate (SENOKOT-S) 8.6-50 MG tablet Take 1 tablet by mouth 2 (two) times daily between meals as needed for mild constipation.     timolol (TIMOPTIC) 0.5 % ophthalmic solution Place 1 drop into both eyes every morning. As directed     No current facility-administered medications for this visit.    REVIEW OF SYSTEMS:   All relevant systems were reviewed with the patient and are negative.  PHYSICAL EXAMINATION: ECOG PERFORMANCE STATUS: {CHL ONC ECOG [phone]}  There were no vitals filed for this visit. There were no vitals filed for this visit.  GENERAL: alert, no distress and comfortable SKIN: skin color is normal, no jaundice, rashes EYES: sclera clear OROPHARYNX: no exudate, no erythema NECK: supple LYMPH:  no palpable lymphadenopathy in the cervical, axillary regions LUNGS: Effort normal, no respiratory distress.  Clear to auscultation bilaterally HEART: regular rate & rhythm and no lower extremity edema ABDOMEN: soft, non-tender and nondistended Musculoskeletal: no point tenderness NEURO: no focal motor/sensory deficits  LABORATORY DATA:  I have reviewed the data as listed Lab Results  Component Value Date   WBC 6.8 01/27/2023   HGB 14.7 01/27/2023   HCT 45.8 01/27/2023   MCV 89.8 01/27/2023   PLT 456 (H) 01/27/2023   Recent Labs    01/18/23 0822 01/20/23 0321 01/23/23 0356 01/24/23 0345 01/26/23 0414 01/27/23 0930  NA 139   < > 136 140 138 137  K 4.3   < > 3.7 3.9 4.2 4.4  CL 105   < > 107 109 104 100  CO2 21*   < > 20* 22 26 26   GLUCOSE 128*   < > 99 107* 115* 136*  BUN 38*   < > 42* 34* 32* 25*  CREATININE 1.47*   <  > 1.28* 1.08 1.16 1.12  CALCIUM 10.0   < > 9.0 9.5 9.3 10.1  GFRNONAA 46*   < > 55* >60 >60 >60  PROT 7.6  --   --   --   --  7.9  ALBUMIN 3.6   < > 2.7* 2.8*  --  3.5  AST 22  --   --   --   --  24  ALT 16  --   --   --   --  27  ALKPHOS 85  --   --   --   --  100  BILITOT 0.9  --   --   --   --  0.8   < > = values in this interval not displayed.    RADIOGRAPHIC STUDIES: I have personally reviewed the radiological images as listed and agreed with the findings in the report. DG Chest 2 View Result Date: 01/23/2023 CLINICAL DATA:  Follow-up pneumonia EXAM: CHEST - 2 VIEW COMPARISON:  01/18/2023 CT FINDINGS: Cardiac shadow is enlarged. Postsurgical changes and aortic calcifications are again noted. Lungs are well aerated bilaterally. Minimal left basilar atelectasis is seen. No bony abnormality is noted. IMPRESSION: Minimal left basilar atelectasis. Electronically Signed   By: Alcide Clever M.D.   On: 01/23/2023 19:19   IR Fluoro Guide Ndl Plmt / BX Result Date: 01/21/2023 INDICATION: Pathologic fracture. EXAM: FLUOROSCOPY GUIDED T12 CORE BONE BIOPSY MEDICATIONS: None. ANESTHESIA/SEDATION: Moderate (conscious) sedation was employed during this procedure. A total of Versed 1.5 mg and Fentanyl 75 mcg was administered intravenously by the radiology nurse. Total intra-service moderate  Sedation Time: 29 minutes. The patient's level of consciousness and vital signs were monitored continuously by radiology nursing throughout the procedure under my direct supervision. Fluoroscopy does: 657 mGy air Kerma. COMPLICATIONS: None immediate. PROCEDURE: Informed written consent was obtained from the patient after a thorough discussion of the procedural risks, benefits and alternatives. All questions were addressed. Maximal Sterile Barrier Technique was utilized including caps, mask, sterile gowns, sterile gloves, sterile drape, hand hygiene and skin antiseptic. A timeout was performed prior to the initiation of the  procedure. The patient was placed in prone position on the angiography table. The thoracic spine region was prepped and draped in a sterile fashion. Under fluoroscopy, the T12 vertebral body was delineated and the skin area was marked. The skin was infiltrated with a 1% Lidocaine approximately 3 cm lateral to the spinous process projection on the right. Using a 22-gauge spinal needle, the soft issue and the peripedicular space and periosteum were infiltrated with Bupivacaine 0.5%. A skin incision was made at the access site. Subsequently, an 11-gauge Kyphon trocar was inserted under fluoroscopic guidance until contact with the pedicle was obtained. The trocar was inserted under light hammer tapping into the pedicle until the posterior boundaries of the vertebral body was reached. The diamond mandrill was removed and 3 core bone biopsy samples were obtained. The trocar was later removed. The access site was cleaned and covered with a sterile bandage. IMPRESSION: Fluoroscopy guided T12 core bone biopsy performed using tiny fragments on each core biopsy pass. Samples obtained were sent for tissue exam. Electronically Signed   By: Baldemar Lenis M.D.   On: 01/21/2023 12:46   DG Bone Survey Met Result Date: 01/20/2023 CLINICAL DATA:  Lytic bone lesions on x-ray. Multiple metastatic deposits throughout the thoracic spine. EXAM: METASTATIC BONE SURVEY COMPARISON:  Thoracic spine MRI 01/18/2023. Chest abdomen pelvis CT 01/18/2023. FINDINGS: Patient with known lesions in the thoracic and lumbar spine. This was better assessed on recent CT and MRI. The majority of these lesions are not well-defined by radiograph. The known T12 compression fracture is not well assessed on the current exam due to osseous overlap. The known left iliac sclerotic lesion is not well seen by radiograph. Area of sclerosis projects over the lateral aspect of the right distal tibia at the ankle joint. Other there are extremity lesion  is seen by radiograph. Prior median sternotomy. Blunting of the left costophrenic angle, likely epicardial fat pad and atelectasis. Aortic atherosclerosis and vascular calcifications. IMPRESSION: 1. Patient with known spinal osseous bone lesions. This was better assessed on recent CT and MRI. The majority of these lesions are not well-defined by radiograph. 2. The known T12 compression fracture is not well assessed on the current exam due to osseous overlap. 3. The known left iliac sclerotic lesion is not well seen by radiograph. 4. Area of sclerosis projects over the lateral aspect of the right distal tibia at the ankle joint, indeterminate. Electronically Signed   By: Narda Rutherford M.D.   On: 01/20/2023 22:50   US RENAL Result Date: 01/20/2023 CLINICAL DATA:  Acute kidney injury EXAM: RENAL / URINARY TRACT ULTRASOUND COMPLETE COMPARISON:  CT 01/18/2023 FINDINGS: Right Kidney: Renal measurements: 9.1 x 4.7 x 4.4 cm = volume: 9.5 mL. Echogenicity within normal limits. No mass or hydronephrosis visualized. Left Kidney: Renal measurements: 11.9 x 5.3 x 5 cm = volume: 165 mL. Echogenicity within normal limits. No mass or hydronephrosis visualized. Bladder: Appears normal for degree of bladder distention. Other: Prostate enlargement  at least 5.7 x 5.3 cm. IMPRESSION: 1. Negative for hydronephrosis. 2. Prostate enlargement. Electronically Signed   By: Corlis Leak M.D.   On: 01/20/2023 16:00   MR THORACIC SPINE WO CONTRAST Result Date: 01/18/2023 CLINICAL DATA:  Bladder cancer. Evaluate for metastatic disease to the thoracic spine. Chronic back pain. Weakness beginning approximately 1 month ago. EXAM: MRI THORACIC SPINE WITHOUT CONTRAST TECHNIQUE: Multiplanar, multisequence MR imaging of the thoracic spine was performed. No intravenous contrast was administered. COMPARISON:  CT angio chest, abdomen and pelvis 01/18/2023. FINDINGS: Alignment: No significant listhesis is present. Mild rightward curvature is  centered at T8-9. Thoracic kyphosis is intact. Vertebrae: Multiple metastatic deposits are present throughout the thoracic spine. A 6 mm lesion is present within the right pedicle of T1. An 8 mm lesion is present posterior elements of T2. Anterior lesion at T2 measures 13 mm. Metastatic lesions are present within the T3 vertebral body and the spinous process of T3. A right superior lesion at T5 extends to the pedicle. Rounded metastases at T7 measure 10 and 11 mm. Larger metastases at T8 extend from the superior to inferior endplate, 18 mm. Similar larger lesions are present throughout the remainder of the lower thoracic spine. A right T11 lesion extends into the right pedicle. Diffuse metastatic infiltration is present in the T12 vertebral body. No pathologic fractures are present. Extensive infiltration of the L1 vertebral body is present. Cord:  Normal signal and morphology. Paraspinal and other soft tissues: See CT report from the same day for further description of metastatic disease outside the spine. Disc levels: A central disc protrusion at T8-9 effaces the ventral CSF. The foramina are patent bilaterally. No significant disc disease or stenosis is present above this level. T9-10: Negative. T10-11: Negative. T11-12: A rightward disc protrusion and annular tear is present without focal stenosis. T12-L1: Negative. IMPRESSION: 1. Multiple metastatic deposits throughout the thoracic spine as described. 2. No pathologic fractures. 3. Central disc protrusion at T8-9 effaces the ventral CSF. 4. Rightward disc protrusion and annular tear at T11-12 without focal stenosis. Electronically Signed   By: Marin Roberts M.D.   On: 01/18/2023 19:20   CT Angio Chest/Abd/Pel for Dissection W and/or Wo Contrast Result Date: 01/18/2023 CLINICAL DATA:  89 year old male with pain, weakness. Known mild aneurysmal dilatation of the ascending thoracic aorta. Bladder cancer. EXAM: CT ANGIOGRAPHY CHEST, ABDOMEN AND PELVIS  TECHNIQUE: Non-contrast CT of the chest was initially obtained. Multidetector CT imaging through the chest, abdomen and pelvis was performed using the standard protocol during bolus administration of intravenous contrast. Multiplanar reconstructed images and MIPs were obtained and reviewed to evaluate the vascular anatomy. RADIATION DOSE REDUCTION: This exam was performed according to the departmental dose-optimization program which includes automated exposure control, adjustment of the mA and/or kV according to patient size and/or use of iterative reconstruction technique. CONTRAST:  75mL OMNIPAQUE IOHEXOL 350 MG/ML SOLN COMPARISON:  CTA chest 08/02/2022 and earlier. Restaging CT Abdomen and Pelvis 12/11/2022 FINDINGS: CTA CHEST FINDINGS Cardiovascular: Previous CABG. Extensive Calcified aortic atherosclerosis. Tortuous ascending aorta is stable, smoothly tapers in the arch. Negative for thoracic aortic dissection, periaortic hematoma. Heart size is stable, within normal limits. No pericardial effusion. Little contrast in the pulmonary arteries. Proximal great vessel atherosclerosis appears stable since July. Mediastinum/Nodes: Negative for mediastinal hematoma, mass, lymphadenopathy. Lungs/Pleura: Stable lung volumes since July. Patchy lung base atelectasis or scarring has not significantly changed. No pleural effusion. Small volume new retained secretions in the trachea just below the thoracic inlet on series  4 image 30. But elsewhere the major airways are patent. No active pulmonary inflammation is identified. Musculoskeletal: Chronic sternotomy. Chronic right 2nd rib deformity. T1 through T11 thoracic vertebrae appear stable since July. T12 compression fracture with mild comminution is new since July but was present, subtle in November when other skeletal metastatic disease was suspected. Central T12 loss of vertebral body height up to 30% (series 6, image 105). Lucency within the T12 body appears progressed  since July. No retropulsion. T12 pedicles and posterior elements appear intact and aligned. There is mild anterior and lateral prevertebral soft tissue or edema which is increased since November on series 11, image 56. No epidural tumor by CT. Review of the MIP images confirms the above findings. CTA ABDOMEN AND PELVIS FINDINGS VASCULAR Aortoiliac calcified atherosclerosis. Extensive aortic and branch calcified atherosclerosis but the major arterial structures in the abdomen and pelvis remain patent. Negative for abdominal aortic dissection or aneurysm. Review of the MIP images confirms the above findings. NON-VASCULAR Hepatobiliary: Stable liver. Cholelithiasis. No gallbladder inflammation by CT. Pancreas: Diminutive. Spleen: Diminutive. Adrenals/Urinary Tract: Normal adrenal glands. Nonobstructed kidneys with symmetric renal enhancement. No hydroureter. Abnormal prostate at the base of the bladder as below. Diminutive bladder similar to the November CT. Stomach/Bowel: Decompressed large bowel from the splenic flexure distally but transverse colon is redundant with moderate retained gas and stool, increased since November. Low-density retained stool in the right colon. Cecum is on a lax mesentery. No dilated small bowel. No large bowel inflammation identified. Stomach and duodenum appear negative. No free air or free fluid. Lymphatic: No lymphadenopathy identified in the abdomen or pelvis. Reproductive: Abnormally heterogeneous, lobulated, masslike appearance of the prostate on series 10, image 203 not significantly changed from the restaging CT last month (please see that report) Other: No pelvis free fluid. Musculoskeletal: Lumbar vertebrae appear stable since November. Medial left iliac sclerotic bone lesions have not significantly changed. Sacrum and SI joints appear intact. Pelvis and proximal femurs appear to remain intact. Review of the MIP images confirms the above findings. IMPRESSION: 1. Negative for  Aortic Dissection. Stable mild fusiform aneurysmal enlargement of the ascending aorta. Severe Aortic Atherosclerosis (ICD10-I70.0). 2. Positive for a subacute and pathologic appearing T12 compression fracture, with underlying lytic lesion and loss of height progressed since November. No retropulsion or other complicating features. In conjunction with November CT findings of skeletal metastatic disease, abnormal bladder base and prostate as detailed on that exam. 3. No other acute finding in the Chest, Abdomen, or Pelvis. Cholelithiasis. Electronically Signed   By: Odessa Fleming M.D.   On: 01/18/2023 10:37

## 2023-02-07 NOTE — Assessment & Plan Note (Deleted)
Supportive baseline bone mineral density study and then every 2 years calcium (1000-1200 mg daily from food and supplements) and vitamin D3 (1000 IU daily) Zometa (5 mg IV annually) for osteopenia (T-score between -1.0 and -2.5) on ADT after dental clearance. If CRPC, 4 mg every 3 months if having bone metastases. Control and prevent diabetes Aggressive cardiovascular risk management Weight-bearing exercises (30 minutes per day) Limit alcohol consumption and avoid smoking

## 2023-02-07 NOTE — Assessment & Plan Note (Deleted)
Started apalutamide on Start ADT in 4 weeks. CBC, CMP, PSA and testosterone in 4 weeks

## 2023-02-07 NOTE — Progress Notes (Signed)
Nutrition Assessment   Reason for Assessment: Referral (wt loss)   ASSESSMENT: 89 year old male with metastatic prostate cancer to bone. He is receiving radiation therapy + Lupron q29m. Patient is under the care of Dr. Kathrynn Running and Dr. Cherly Hensen.  Past medical history includes HTN, aortic aneurysm, RBBB, DM2, cervical spondylosis, compression fracture of T12, BPH, CKD3, HLD, s/p CABG, sev PCM  12/28- 1/7 admission with AKI + compression fracture - d/c to SNF  Met with patient in office following radiation. Patient arrives in wheelchair. He wearing an upper body brace s/p compression fracture on 12/28. Patient currently residing at rehab facility. Patient reports feeling stronger overall, but back is weak today. He is looking forward to going home on Monday. Patient does not care for facility food. Reports eating 40-60% of meals three times daily. Patient reports he was receiving 2 Ensure at facility. Now he gets none. Patient denies nausea, vomiting, constipation. He is having watery bowel movements.   Nutrition Focused Physical Exam: unable to complete (wearing upper body brace, wheelchair). Noted moderate/severe fat and muscle depletions per 1/3 inpt RD exam   Medications: erleada, vit D, B12, gabapentin, novolog, protonix, miralax, inderal, crestor, senna   Labs: 1/6 - glucose 136, BUN 25   Anthropometrics: Weights have decreased 8% from 200 lb 4 oz on 8/28 - this is severe for time frame  Height: 6'2" Weight: 184 lb (12/28) UBW: 235 lb (per pt) BMI: 23.62   NUTRITION DIAGNOSIS: Unintended wt loss related to cancer as evidenced by 8% wt loss in 4 months - severe   MALNUTRITION DIAGNOSIS: Severe malnutrition - ongoing    INTERVENTION:  Encouraged high calorie high protein foods, suggested soft moist textures for ease of intake - snack ideas + soft moist high protein foods provided Educated on strategies for diarrhea - handout with tips provided Recommend 2 Ensure  Complete/equivalent - samples + coupons Contact information given   MONITORING, EVALUATION, GOAL: Pt will tolerate increased calories and protein to promote wt gain   Next Visit: To be scheduled as needed. Encouraged pt to call with nutrition questions/concerns

## 2023-02-10 ENCOUNTER — Other Ambulatory Visit: Payer: Self-pay

## 2023-02-10 ENCOUNTER — Ambulatory Visit: Payer: Medicare Other

## 2023-02-10 ENCOUNTER — Inpatient Hospital Stay: Payer: Medicare Other

## 2023-02-10 ENCOUNTER — Ambulatory Visit
Admission: RE | Admit: 2023-02-10 | Discharge: 2023-02-10 | Disposition: A | Discharge status: Home / Self Care | Payer: Medicare Other | Source: Ambulatory Visit | Attending: Radiation Oncology | Admitting: Radiation Oncology

## 2023-02-10 DIAGNOSIS — C61 Malignant neoplasm of prostate: Secondary | ICD-10-CM

## 2023-02-10 DIAGNOSIS — C7951 Secondary malignant neoplasm of bone: Secondary | ICD-10-CM

## 2023-02-10 DIAGNOSIS — Z9189 Other specified personal risk factors, not elsewhere classified: Secondary | ICD-10-CM

## 2023-02-10 DIAGNOSIS — Z510 Encounter for antineoplastic radiation therapy: Secondary | ICD-10-CM | POA: Diagnosis not present

## 2023-02-10 LAB — CMP (CANCER CENTER ONLY)
ALT: 10 U/L (ref 0–44)
AST: 14 U/L — ABNORMAL LOW (ref 15–41)
Albumin: 3.5 g/dL (ref 3.5–5.0)
Alkaline Phosphatase: 109 U/L (ref 38–126)
Anion gap: 6 (ref 5–15)
BUN: 29 mg/dL — ABNORMAL HIGH (ref 8–23)
CO2: 27 mmol/L (ref 22–32)
Calcium: 9.3 mg/dL (ref 8.9–10.3)
Chloride: 104 mmol/L (ref 98–111)
Creatinine: 1.18 mg/dL (ref 0.61–1.24)
GFR, Estimated: >60 mL/min (ref >60)
Glucose, Bld: 146 mg/dL — ABNORMAL HIGH (ref 70–99)
Potassium: 4.1 mmol/L (ref 3.5–5.1)
Sodium: 137 mmol/L (ref 135–145)
Total Bilirubin: 0.5 mg/dL (ref 0.0–1.2)
Total Protein: 6.3 g/dL — ABNORMAL LOW (ref 6.5–8.1)

## 2023-02-10 LAB — CBC WITH DIFFERENTIAL (CANCER CENTER ONLY)
Abs Immature Granulocytes: 0.05 10*3/uL (ref 0.00–0.07)
Basophils Absolute: 0 10*3/uL (ref 0.0–0.1)
Basophils Relative: 0 %
Eosinophils Absolute: 0.2 10*3/uL (ref 0.0–0.5)
Eosinophils Relative: 3 %
HCT: 42.8 % (ref 39.0–52.0)
Hemoglobin: 13.5 g/dL (ref 13.0–17.0)
Immature Granulocytes: 1 %
Lymphocytes Relative: 10 %
Lymphs Abs: 0.8 10*3/uL (ref 0.7–4.0)
MCH: 28.2 pg (ref 26.0–34.0)
MCHC: 31.5 g/dL (ref 30.0–36.0)
MCV: 89.4 fL (ref 80.0–100.0)
Monocytes Absolute: 0.6 10*3/uL (ref 0.1–1.0)
Monocytes Relative: 7 %
Neutro Abs: 6.8 10*3/uL (ref 1.7–7.7)
Neutrophils Relative %: 79 %
Platelet Count: 211 10*3/uL (ref 150–400)
RBC: 4.79 MIL/uL (ref 4.22–5.81)
RDW: 13.7 % (ref 11.5–15.5)
WBC Count: 8.6 10*3/uL (ref 4.0–10.5)
nRBC: 0 % (ref 0.0–0.2)

## 2023-02-10 LAB — RAD ONC ARIA SESSION SUMMARY
Course Elapsed Days: 13
Plan Fractions Treated to Date: 10
Plan Prescribed Dose Per Fraction: 3 Gy
Plan Total Fractions Prescribed: 10
Plan Total Prescribed Dose: 30 Gy
Reference Point Dosage Given to Date: 30 Gy
Reference Point Session Dosage Given: 3 Gy
Session Number: 10

## 2023-02-11 ENCOUNTER — Ambulatory Visit: Payer: Medicare Other

## 2023-02-11 ENCOUNTER — Inpatient Hospital Stay: Payer: Medicare Other

## 2023-02-11 DIAGNOSIS — C61 Malignant neoplasm of prostate: Secondary | ICD-10-CM

## 2023-02-11 DIAGNOSIS — G893 Neoplasm related pain (acute) (chronic): Secondary | ICD-10-CM | POA: Diagnosis not present

## 2023-02-11 DIAGNOSIS — Z9189 Other specified personal risk factors, not elsewhere classified: Secondary | ICD-10-CM

## 2023-02-11 DIAGNOSIS — C7951 Secondary malignant neoplasm of bone: Secondary | ICD-10-CM | POA: Diagnosis not present

## 2023-02-11 LAB — TESTOSTERONE: Testosterone: 376 ng/dL (ref 264–916)

## 2023-02-11 LAB — PROSTATE-SPECIFIC AG, SERUM (LABCORP): Prostate Specific Ag, Serum: 27.8 ng/mL — ABNORMAL HIGH (ref 0.0–4.0)

## 2023-02-11 MED ORDER — DEXAMETHASONE 2 MG PO TABS: 2.0000 mg | ORAL_TABLET | Freq: Two times a day (BID) | ORAL | Status: DC

## 2023-02-11 NOTE — Assessment & Plan Note (Addendum)
Completed RT on 1/20 Continue Tylenol as needed.  Daily limit 3000 mg maximum. Continue dexamethasone.  Currently 2 mg twice daily.  Follow-up in 2 weeks. Gabapentin 300 mg twice daily

## 2023-02-11 NOTE — Radiation Completion Notes (Addendum)
  Radiation Oncology         [phone] ________________________________  Name: William Faulkner MRN: [id number]  Date: 02/10/2023  DOB: November 17, 1934  Referring Physician: Geanie Berlin, M.D. Date of Service: 2023-02-11 Radiation Oncologist: Margaretmary Bayley, M.D. [facility]     RADIATION ONCOLOGY END OF TREATMENT NOTE     Diagnosis: 89 yo man with newly diagnosed ADT-Naive, Castration Sensitive Metastatic Prostate Cancer with painful T12 metastasis.   Intent: Palliative     ==========DELIVERED PLANS==========  First Treatment Date: 2023-01-28 Last Treatment Date: 2023-02-10   Plan Name: Spine_T12 Site: Thoracic Spine Technique: 3D Mode: Photon Dose Per Fraction: 3 Gy Prescribed Dose (Delivered / Prescribed): 30 Gy / 30 Gy Prescribed Fxs (Delivered / Prescribed): 10 / 10     ==========ON TREATMENT VISIT DATES========== 2023-01-30, 2023-02-10   See weekly On Treatment Notes in Epic for details in the Media tab (listed as Progress notes on the On Treatment Visit Dates listed above).  He tolerated the daily radiation treatments well with only modest fatigue.  The patient will receive a call in about one month from the radiation oncology department. He will continue follow up with his medical oncologist, Dr. Cherly Hensen, as well.  ------------------------------------------------   Margaretmary Dys, MD [facility]  Radiation Oncology Direct Dial: [phone]  Fax: [phone] [email or web address]  Skype  LinkedIn

## 2023-02-11 NOTE — Assessment & Plan Note (Addendum)
Continue apalutamide  Start ADT in near future. Given fatigue from radiation, will readdress next month CBC, CMP, PSA and testosterone in Feb

## 2023-02-11 NOTE — Progress Notes (Signed)
[facility] CONSULT NOTE  Patient Care Team: Wanda Plump, MD as PCP - General (Internal Medicine) [name] Madolyn Frieze, MD as PCP - Cardiology (Cardiology) [name] Madolyn Frieze, MD as Consulting Physician (Cardiology) Beverely Low, MD as Consulting Physician (Orthopedic Surgery) Sheran Luz, MD as Consulting Physician (Physical Medicine and Rehabilitation) Nelson Chimes, MD as Consulting Physician (Ophthalmology) Jannifer Hick, MD as Consulting Physician (Urology) Cherlyn Cushing, RN as Oncology Nurse Navigator Pickenpack-Cousar, Arty Baumgartner, NP as Nurse Practitioner (Hospice and Palliative Medicine)  ASSESSMENT & PLAN:  William Faulkner is a 89 y.o.male with history of CAD, CVA, hypertension, hyperlipidemia, BPH being follow up for prostate cancer.  Patient requested telephone visit today.  Location of the provider: [facility] Location of the patient: home  Current diagnosis: mHSPC with diffuse bone metastases Initial diagnosis: mHSPC iPSA 261 Germline testing: Will refer in the future Somatic testing: Not yet Treatment: Apalutamide 02/01/23.  The patient was counseled on the natural history of prostate cancer and the standard treatment options that are available for prostate cancer.   Prostate cancer (HCC) Continue apalutamide  Start ADT in near future. Given fatigue from radiation, will readdress next month CBC, CMP, PSA and testosterone in Feb  At risk for side effect of medication Supportive baseline bone mineral density study calcium (1000-1200 mg daily from food and supplements) and vitamin D3 (1000 IU daily) Zometa after dental clearance.  He has not seen dentist for 3 years.  Will be looking for a dentist before starting 4 mg every 3 months. Control and prevent diabetes Aggressive cardiovascular risk management Once pain improved can start exercises  Limit alcohol consumption and avoid smoking  Cancer related pain Completed RT on 1/20 Continue Tylenol as  needed.  Daily limit 3000 mg maximum. Continue dexamethasone.  Currently 2 mg twice daily.  Follow-up in 2 weeks. Gabapentin 300 mg twice daily  Orders Placed This Encounter  Procedures   CBC with Differential (Cancer Center Only)    Standing Status:   Future    Expiration Date:   02/11/2024   CMP (Cancer Center only)    Standing Status:   Future    Expiration Date:   02/11/2024   Prostate-Specific AG, Serum    Standing Status:   Future    Expiration Date:   02/11/2024   Testosterone    Standing Status:   Future    Expiration Date:   02/11/2024     The total time spent in the appointment was 30 minutes encounter with patients including review of chart and various tests results, discussions about plan of care and coordination of care plan  All questions were answered. The patient knows to call the clinic with any problems, questions or concerns. No barriers to learning was detected.  Melven Sartorius, MD 1/21/202511:34 AM  CHIEF COMPLAINTS/PURPOSE OF CONSULTATION:  Prostate cancer  HISTORY OF PRESENTING ILLNESS:  William Faulkner 89 y.o. male is being follow-up through telephone visit because of prostate cancer. I have reviewed his chart and materials related to his cancer extensively and collaborated history with the patient.   Overall pain has not improved.  He is using Tylenol, gabapentin, dexamethasone as instructed.  Reports may have strained his muscle this morning trying to help his wife. Wife on the phone with him.  Summary of oncologic history is as follows: Oncology History  Prostate cancer (HCC)  01/18/2023 Imaging   CTA CAP 1. Negative for Aortic Dissection. Stable mild fusiform aneurysmal enlargement of the ascending  aorta. Severe Aortic Atherosclerosis (ICD10-I70.0).   2. Positive for a subacute and pathologic appearing T12 compression fracture, with underlying lytic lesion and loss of height progressed since November. No retropulsion or other complicating  features. In conjunction with November CT findings of skeletal metastatic disease, abnormal bladder base and prostate as detailed on that exam.   3. No other acute finding in the Chest, Abdomen, or Pelvis. Cholelithiasis.   01/18/2023 Imaging   MRI T spine 1. Multiple metastatic deposits throughout the thoracic spine as described. 2. No pathologic fractures. 3. Central disc protrusion at T8-9 effaces the ventral CSF. 4. Rightward disc protrusion and annular tear at T11-12 without focal stenosis   01/20/2023 Imaging   Bone survey IMPRESSION: 1. Patient with known spinal osseous bone lesions. This was better assessed on recent CT and MRI. The majority of these lesions are not well-defined by radiograph. 2. The known T12 compression fracture is not well assessed on the current exam due to osseous overlap. 3. The known left iliac sclerotic lesion is not well seen by radiograph. 4. Area of sclerosis projects over the lateral aspect of the right distal tibia at the ankle joint, indeterminate.   01/20/2023 Tumor Marker   PSA 261   01/21/2023 Initial Diagnosis   Prostate cancer (HCC)   01/21/2023 Pathology Results   BONE, T12, BIOPSY:  - Metastatic carcinoma to bone, consistent with a primary prostatic carcinoma   Started bicalutamide while inpatient   01/27/2023 Cancer Staging   Staging form: Prostate, AJCC 8th Edition - Clinical stage from 01/27/2023: Stage IVB (cTX, cNX, pM1b, PSA: 261) - Signed by Melven Sartorius, MD on 01/27/2023 Stage prefix: Initial diagnosis Prostate specific antigen (PSA) range: 20 or greater   02/01/2023 -  Chemotherapy   Start apalutamide   02/10/2023 Tumor Marker   PSA 27.8     MEDICAL HISTORY:  Past Medical History:  Diagnosis Date   AORTIC ANEUR UNSPEC SITE WITHOUT MENTION RUPTURE    AORTIC INSUFFICIENCY    BPH (benign prostatic hyperplasia)    CAD, ARTERY BYPASS GRAFT    Cancer (HCC)    CEREBROVASCULAR DISEASE    DIAB W/O COMP TYPE  II/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 STATED UNCNTRL    Hyperlipidemia    HYPERTENSION, ESSENTIAL NOS    NEOPLASM, MALIGNANT, BLADDER, HX OF    Rotator cuff tear    Left shoulder,     SURGICAL HISTORY: Past Surgical History:  Procedure Laterality Date   BACK SURGERY  2003   lower   BLADDER TUMOR EXCISION     Surgery x6, Dr.Tannebaum   BLEPHAROPLASTY  1980   bilaterally, Dr Nile Riggs   COLONOSCOPY W/ POLYPECTOMY     Dr Aw[facility]   CORONARY ARTERY BYPASS GRAFT  2011   x 5   CYSTOSCOPY  09/02/2012   INGUINAL HERNIA REPAIR  10/24/2011   Procedure: HERNIA REPAIR INGUINAL ADULT;  Surgeon: Velora Heckler, MD;  Location: [facility];  Service: General;  Laterality: Right;  Repair Right Inguinal Hernia with Mesh, Umbilical Hernia Repair with Mesh   IR FLUORO GUIDED NEEDLE PLC ASPIRATION/INJECTION LOC  01/21/2023   LUMBAR EPIDURAL INJECTION  2008   ROTATOR CUFF REPAIR  2009   right; Dr Simonne Come   SHOULDER ARTHROSCOPY WITH ROTATOR CUFF REPAIR AND SUBACROMIAL DECOMPRESSION  02/14/2014   Right, SAD/DCR, biceps tenodesis   UMBILICAL HERNIA REPAIR  10/24/2011   Procedure: HERNIA REPAIR UMBILICAL ADULT;  Surgeon: Velora Heckler, MD;  Location: [facility];  Service: General;  Laterality: N/A;  SOCIAL HISTORY: Social History   Socioeconomic History   Marital status: Married    Spouse name: Not on file   Number of children: 3   Years of education: Not on file   Highest education level: Not on file  Occupational History   Occupation: retired  Tobacco Use   Smoking status: Former    Current packs/day: 0.00    Average packs/day: 0.3 packs/day for 15.0 years (4.5 ttl pk-yrs)    Types: Cigarettes    Start date: 01/21/1969    Quit date: 01/22/1984    Years since quitting: 39.0   Smokeless tobacco: Never  Vaping Use   Vaping status: Never Used  Substance and Sexual Activity   Alcohol use: No    Alcohol/week: 0.0 standard drinks of alcohol    Comment: rare, wine    Drug use: No   Sexual activity: Not on file  Other  Topics Concern   Not on file  Social History Narrative   8 G-children   Married x 64 years   Lives with wife in a one story home.  Has 3 children.     Retired from [employer].  Education: college.    Right handed   Drinks caffeine   One story home   Social Drivers of Health   Financial Resource Strain: Not on file  Food Insecurity: No Food Insecurity (01/18/2023)   Hunger Vital Sign    Worried About Running Out of Food in the Last Year: Never true    Ran Out of Food in the Last Year: Never true  Transportation Needs: No Transportation Needs (01/18/2023)   PRAPARE - Administrator, Civil Service (Medical): No    Lack of Transportation (Non-Medical): No  Physical Activity: Not on file  Stress: Not on file  Social Connections: Moderately Isolated (01/20/2023)   Social Connection and Isolation Panel [NHANES]    Frequency of Communication with Friends and Family: More than three times a week    Frequency of Social Gatherings with Friends and Family: More than three times a week    Attends Religious Services: Never    Database administrator or Organizations: No    Attends Banker Meetings: Never    Marital Status: Married  Catering manager Violence: Not At Risk (01/18/2023)   Humiliation, Afraid, Rape, and Kick questionnaire    Fear of Current or Ex-Partner: No    Emotionally Abused: No    Physically Abused: No    Sexually Abused: No    FAMILY HISTORY: Family History  Problem Relation Age of Onset   Diabetes Mother    Tremor Father    Other Sister    Stroke Neg Hx    Heart disease Neg Hx    Cancer Neg Hx     ALLERGIES:  is allergic to atorvastatin, celecoxib, sulfonamide derivatives, and zolpidem tartrate.  MEDICATIONS:  Current Outpatient Medications  Medication Sig Dispense Refill   apalutamide (ERLEADA) 60 MG tablet Take 3 tablets (180 mg total) by mouth daily. May discontinue bicalutamide when starts apalutamide 90 tablet 11   aspirin 81 MG  tablet Take 81 mg by mouth daily.     augmented betamethasone dipropionate (DIPROLENE-AF) 0.05 % cream Apply topically 2 (two) times daily. 60 g 0   cholecalciferol (VITAMIN D) 1000 UNITS tablet Take 1,000 Units by mouth daily.     Cyanocobalamin ([employer] VITAMIN B-12) 5000 MCG SUBL Take 5,000 mcg by mouth daily. (Patient not taking: Reported on 09/18/2022)  dexamethasone (DECADRON) 2 MG tablet Take 1 tablet (2 mg total) by mouth 2 (two) times daily with a meal. 30 tablet    feeding supplement (ENSURE ENLIVE / ENSURE PLUS) LIQD Take 237 mLs by mouth 2 (two) times daily between meals. 14220 mL 0   gabapentin (NEURONTIN) 300 MG capsule Take 1 capsule (300 mg total) by mouth 2 (two) times daily.     insulin aspart (NOVOLOG) 100 UNIT/ML injection Inject 0-9 Units into the skin 3 (three) times daily with meals. Sliding scale CBG 70 - 120: 0 units CBG 121 - 150: 1 unit,  CBG 151 - 200: 2 units,  CBG 201 - 250: 3 units,  CBG 251 - 300: 5 units,  CBG 301 - 350: 7 units,  CBG 351 - 400: 9 units   CBG > 400: 9 units and notify your MD     ketoconazole (NIZORAL) 2 % cream Apply 1 application topically daily. 30 g 0   nystatin (MYCOSTATIN/NYSTOP) powder Apply topically 3 (three) times daily. Apply to affected area.     pantoprazole (PROTONIX) 40 MG tablet Take 1 tablet (40 mg total) by mouth daily at 6 (six) AM.     polyethylene glycol powder (MIRALAX) 17 GM/SCOOP powder Take 17 g by mouth daily. For constipation.     propranolol (INDERAL) 40 MG tablet TAKE 1 TABLET BY MOUTH DAILY (Patient taking differently: Take 40 mg by mouth daily.) 90 tablet 0   rosuvastatin (CRESTOR) 20 MG tablet TAKE 1 TABLET BY MOUTH DAILY (Patient taking differently: Take 20 mg by mouth daily.) 90 tablet 3   senna-docusate (SENOKOT-S) 8.6-50 MG tablet Take 1 tablet by mouth 2 (two) times daily between meals as needed for mild constipation.     timolol (TIMOPTIC) 0.5 % ophthalmic solution Place 1 drop into both eyes every morning. As  directed     No current facility-administered medications for this visit.    REVIEW OF SYSTEMS:   All relevant systems were reviewed with the patient and are negative.  PHYSICAL EXAMINATION: ECOG PERFORMANCE STATUS: 2 - Symptomatic, <50% confined to bed  There were no vitals filed for this visit. There were no vitals filed for this visit. NA  LABORATORY DATA:  I have reviewed the data as listed Lab Results  Component Value Date   WBC 8.6 02/10/2023   HGB 13.5 02/10/2023   HCT 42.8 02/10/2023   MCV 89.4 02/10/2023   PLT 211 02/10/2023   Recent Labs    01/18/23 0822 01/20/23 0321 01/24/23 0345 01/26/23 0414 01/27/23 0930 02/10/23 1518  NA 139   < > 140 138 137 137  K 4.3   < > 3.9 4.2 4.4 4.1  CL 105   < > 109 104 100 104  CO2 21*   < > 22 26 26 27   GLUCOSE 128*   < > 107* 115* 136* 146*  BUN 38*   < > 34* 32* 25* 29*  CREATININE 1.47*   < > 1.08 1.16 1.12 1.18  CALCIUM 10.0   < > 9.5 9.3 10.1 9.3  GFRNONAA 46*   < > >60 >60 >60 >60  PROT 7.6  --   --   --  7.9 6.3*  ALBUMIN 3.6   < > 2.8*  --  3.5 3.5  AST 22  --   --   --  24 14*  ALT 16  --   --   --  27 10  ALKPHOS 85  --   --   --  100 109  BILITOT 0.9  --   --   --  0.8 0.5   < > = values in this interval not displayed.    RADIOGRAPHIC STUDIES: I have personally reviewed the radiological images as listed and agreed with the findings in the report. DG Chest 2 View Result Date: 01/23/2023 CLINICAL DATA:  Follow-up pneumonia EXAM: CHEST - 2 VIEW COMPARISON:  01/18/2023 CT FINDINGS: Cardiac shadow is enlarged. Postsurgical changes and aortic calcifications are again noted. Lungs are well aerated bilaterally. Minimal left basilar atelectasis is seen. No bony abnormality is noted. IMPRESSION: Minimal left basilar atelectasis. Electronically Signed   By: Alcide Clever M.D.   On: 01/23/2023 19:19   IR Fluoro Guide Ndl Plmt / BX Result Date: 01/21/2023 INDICATION: Pathologic fracture. EXAM: FLUOROSCOPY GUIDED T12  CORE BONE BIOPSY MEDICATIONS: None. ANESTHESIA/SEDATION: Moderate (conscious) sedation was employed during this procedure. A total of Versed 1.5 mg and Fentanyl 75 mcg was administered intravenously by the radiology nurse. Total intra-service moderate Sedation Time: 29 minutes. The patient's level of consciousness and vital signs were monitored continuously by radiology nursing throughout the procedure under my direct supervision. Fluoroscopy does: 657 mGy air Kerma. COMPLICATIONS: None immediate. PROCEDURE: Informed written consent was obtained from the patient after a thorough discussion of the procedural risks, benefits and alternatives. All questions were addressed. Maximal Sterile Barrier Technique was utilized including caps, mask, sterile gowns, sterile gloves, sterile drape, hand hygiene and skin antiseptic. A timeout was performed prior to the initiation of the procedure. The patient was placed in prone position on the angiography table. The thoracic spine region was prepped and draped in a sterile fashion. Under fluoroscopy, the T12 vertebral body was delineated and the skin area was marked. The skin was infiltrated with a 1% Lidocaine approximately 3 cm lateral to the spinous process projection on the right. Using a 22-gauge spinal needle, the soft issue and the peripedicular space and periosteum were infiltrated with Bupivacaine 0.5%. A skin incision was made at the access site. Subsequently, an 11-gauge Kyphon trocar was inserted under fluoroscopic guidance until contact with the pedicle was obtained. The trocar was inserted under light hammer tapping into the pedicle until the posterior boundaries of the vertebral body was reached. The diamond mandrill was removed and 3 core bone biopsy samples were obtained. The trocar was later removed. The access site was cleaned and covered with a sterile bandage. IMPRESSION: Fluoroscopy guided T12 core bone biopsy performed using tiny fragments on each core biopsy  pass. Samples obtained were sent for tissue exam. Electronically Signed   By: Baldemar Lenis M.D.   On: 01/21/2023 12:46   DG Bone Survey Met Result Date: 01/20/2023 CLINICAL DATA:  Lytic bone lesions on x-ray. Multiple metastatic deposits throughout the thoracic spine. EXAM: METASTATIC BONE SURVEY COMPARISON:  Thoracic spine MRI 01/18/2023. Chest abdomen pelvis CT 01/18/2023. FINDINGS: Patient with known lesions in the thoracic and lumbar spine. This was better assessed on recent CT and MRI. The majority of these lesions are not well-defined by radiograph. The known T12 compression fracture is not well assessed on the current exam due to osseous overlap. The known left iliac sclerotic lesion is not well seen by radiograph. Area of sclerosis projects over the lateral aspect of the right distal tibia at the ankle joint. Other there are extremity lesion is seen by radiograph. Prior median sternotomy. Blunting of the left costophrenic angle, likely epicardial fat pad and atelectasis. Aortic atherosclerosis and vascular calcifications. IMPRESSION: 1. Patient  with known spinal osseous bone lesions. This was better assessed on recent CT and MRI. The majority of these lesions are not well-defined by radiograph. 2. The known T12 compression fracture is not well assessed on the current exam due to osseous overlap. 3. The known left iliac sclerotic lesion is not well seen by radiograph. 4. Area of sclerosis projects over the lateral aspect of the right distal tibia at the ankle joint, indeterminate. Electronically Signed   By: Narda Rutherford M.D.   On: 01/20/2023 22:50   US RENAL Result Date: 01/20/2023 CLINICAL DATA:  Acute kidney injury EXAM: RENAL / URINARY TRACT ULTRASOUND COMPLETE COMPARISON:  CT 01/18/2023 FINDINGS: Right Kidney: Renal measurements: 9.1 x 4.7 x 4.4 cm = volume: 9.5 mL. Echogenicity within normal limits. No mass or hydronephrosis visualized. Left Kidney: Renal measurements: 11.9  x 5.3 x 5 cm = volume: 165 mL. Echogenicity within normal limits. No mass or hydronephrosis visualized. Bladder: Appears normal for degree of bladder distention. Other: Prostate enlargement at least 5.7 x 5.3 cm. IMPRESSION: 1. Negative for hydronephrosis. 2. Prostate enlargement. Electronically Signed   By: Corlis Leak M.D.   On: 01/20/2023 16:00   MR THORACIC SPINE WO CONTRAST Result Date: 01/18/2023 CLINICAL DATA:  Bladder cancer. Evaluate for metastatic disease to the thoracic spine. Chronic back pain. Weakness beginning approximately 1 month ago. EXAM: MRI THORACIC SPINE WITHOUT CONTRAST TECHNIQUE: Multiplanar, multisequence MR imaging of the thoracic spine was performed. No intravenous contrast was administered. COMPARISON:  CT angio chest, abdomen and pelvis 01/18/2023. FINDINGS: Alignment: No significant listhesis is present. Mild rightward curvature is centered at T8-9. Thoracic kyphosis is intact. Vertebrae: Multiple metastatic deposits are present throughout the thoracic spine. A 6 mm lesion is present within the right pedicle of T1. An 8 mm lesion is present posterior elements of T2. Anterior lesion at T2 measures 13 mm. Metastatic lesions are present within the T3 vertebral body and the spinous process of T3. A right superior lesion at T5 extends to the pedicle. Rounded metastases at T7 measure 10 and 11 mm. Larger metastases at T8 extend from the superior to inferior endplate, 18 mm. Similar larger lesions are present throughout the remainder of the lower thoracic spine. A right T11 lesion extends into the right pedicle. Diffuse metastatic infiltration is present in the T12 vertebral body. No pathologic fractures are present. Extensive infiltration of the L1 vertebral body is present. Cord:  Normal signal and morphology. Paraspinal and other soft tissues: See CT report from the same day for further description of metastatic disease outside the spine. Disc levels: A central disc protrusion at T8-9  effaces the ventral CSF. The foramina are patent bilaterally. No significant disc disease or stenosis is present above this level. T9-10: Negative. T10-11: Negative. T11-12: A rightward disc protrusion and annular tear is present without focal stenosis. T12-L1: Negative. IMPRESSION: 1. Multiple metastatic deposits throughout the thoracic spine as described. 2. No pathologic fractures. 3. Central disc protrusion at T8-9 effaces the ventral CSF. 4. Rightward disc protrusion and annular tear at T11-12 without focal stenosis. Electronically Signed   By: Marin Roberts M.D.   On: 01/18/2023 19:20   CT Angio Chest/Abd/Pel for Dissection W and/or Wo Contrast Result Date: 01/18/2023 CLINICAL DATA:  89 year old male with pain, weakness. Known mild aneurysmal dilatation of the ascending thoracic aorta. Bladder cancer. EXAM: CT ANGIOGRAPHY CHEST, ABDOMEN AND PELVIS TECHNIQUE: Non-contrast CT of the chest was initially obtained. Multidetector CT imaging through the chest, abdomen and pelvis was performed using  the standard protocol during bolus administration of intravenous contrast. Multiplanar reconstructed images and MIPs were obtained and reviewed to evaluate the vascular anatomy. RADIATION DOSE REDUCTION: This exam was performed according to the departmental dose-optimization program which includes automated exposure control, adjustment of the mA and/or kV according to patient size and/or use of iterative reconstruction technique. CONTRAST:  75mL OMNIPAQUE IOHEXOL 350 MG/ML SOLN COMPARISON:  CTA chest 08/02/2022 and earlier. Restaging CT Abdomen and Pelvis 12/11/2022 FINDINGS: CTA CHEST FINDINGS Cardiovascular: Previous CABG. Extensive Calcified aortic atherosclerosis. Tortuous ascending aorta is stable, smoothly tapers in the arch. Negative for thoracic aortic dissection, periaortic hematoma. Heart size is stable, within normal limits. No pericardial effusion. Little contrast in the pulmonary arteries. Proximal  great vessel atherosclerosis appears stable since July. Mediastinum/Nodes: Negative for mediastinal hematoma, mass, lymphadenopathy. Lungs/Pleura: Stable lung volumes since July. Patchy lung base atelectasis or scarring has not significantly changed. No pleural effusion. Small volume new retained secretions in the trachea just below the thoracic inlet on series 4 image 30. But elsewhere the major airways are patent. No active pulmonary inflammation is identified. Musculoskeletal: Chronic sternotomy. Chronic right 2nd rib deformity. T1 through T11 thoracic vertebrae appear stable since July. T12 compression fracture with mild comminution is new since July but was present, subtle in November when other skeletal metastatic disease was suspected. Central T12 loss of vertebral body height up to 30% (series 6, image 105). Lucency within the T12 body appears progressed since July. No retropulsion. T12 pedicles and posterior elements appear intact and aligned. There is mild anterior and lateral prevertebral soft tissue or edema which is increased since November on series 11, image 56. No epidural tumor by CT. Review of the MIP images confirms the above findings. CTA ABDOMEN AND PELVIS FINDINGS VASCULAR Aortoiliac calcified atherosclerosis. Extensive aortic and branch calcified atherosclerosis but the major arterial structures in the abdomen and pelvis remain patent. Negative for abdominal aortic dissection or aneurysm. Review of the MIP images confirms the above findings. NON-VASCULAR Hepatobiliary: Stable liver. Cholelithiasis. No gallbladder inflammation by CT. Pancreas: Diminutive. Spleen: Diminutive. Adrenals/Urinary Tract: Normal adrenal glands. Nonobstructed kidneys with symmetric renal enhancement. No hydroureter. Abnormal prostate at the base of the bladder as below. Diminutive bladder similar to the November CT. Stomach/Bowel: Decompressed large bowel from the splenic flexure distally but transverse colon is  redundant with moderate retained gas and stool, increased since November. Low-density retained stool in the right colon. Cecum is on a lax mesentery. No dilated small bowel. No large bowel inflammation identified. Stomach and duodenum appear negative. No free air or free fluid. Lymphatic: No lymphadenopathy identified in the abdomen or pelvis. Reproductive: Abnormally heterogeneous, lobulated, masslike appearance of the prostate on series 10, image 203 not significantly changed from the restaging CT last month (please see that report) Other: No pelvis free fluid. Musculoskeletal: Lumbar vertebrae appear stable since November. Medial left iliac sclerotic bone lesions have not significantly changed. Sacrum and SI joints appear intact. Pelvis and proximal femurs appear to remain intact. Review of the MIP images confirms the above findings. IMPRESSION: 1. Negative for Aortic Dissection. Stable mild fusiform aneurysmal enlargement of the ascending aorta. Severe Aortic Atherosclerosis (ICD10-I70.0). 2. Positive for a subacute and pathologic appearing T12 compression fracture, with underlying lytic lesion and loss of height progressed since November. No retropulsion or other complicating features. In conjunction with November CT findings of skeletal metastatic disease, abnormal bladder base and prostate as detailed on that exam. 3. No other acute finding in the Chest, Abdomen, or Pelvis.  Cholelithiasis. Electronically Signed   By: Odessa Fleming M.D.   On: 01/18/2023 10:37

## 2023-02-11 NOTE — Assessment & Plan Note (Addendum)
Supportive baseline bone mineral density study calcium (1000-1200 mg daily from food and supplements) and vitamin D3 (1000 IU daily) Zometa after dental clearance.  He has not seen dentist for 3 years.  Will be looking for a dentist before starting 4 mg every 3 months. Control and prevent diabetes Aggressive cardiovascular risk management Once pain improved can start exercises  Limit alcohol consumption and avoid smoking

## 2023-02-13 ENCOUNTER — Other Ambulatory Visit: Payer: Self-pay | Admitting: *Deleted

## 2023-02-13 DIAGNOSIS — C7951 Secondary malignant neoplasm of bone: Secondary | ICD-10-CM

## 2023-02-13 MED ORDER — DEXAMETHASONE 2 MG PO TABS: 2.0000 mg | ORAL_TABLET | Freq: Two times a day (BID) | ORAL | 0 refills | Status: DC

## 2023-02-18 ENCOUNTER — Telehealth: Payer: Self-pay

## 2023-02-18 NOTE — Telephone Encounter (Addendum)
TC from [facility], requesting a verbal order to authorize a HHA for patient. Consulted Dr. Cherly Hensen and he is in agreement w/ this. Relayed this to [place]. She states she also needs to clarify pt's medications and is faxing what needs to be addressed.   1610 Received fax from [facility] regarding medication clarification. Faxed pt's current medication list (signed by Dr. Cherly Hensen) w/ instructions to have pt clarify insulin/Metformin usage w/ PCP--see scanned documents.

## 2023-02-19 ENCOUNTER — Telehealth: Payer: Self-pay

## 2023-02-19 ENCOUNTER — Telehealth: Payer: Self-pay | Admitting: *Deleted

## 2023-02-19 DIAGNOSIS — R399 Unspecified symptoms and signs involving the genitourinary system: Secondary | ICD-10-CM

## 2023-02-19 NOTE — Telephone Encounter (Signed)
Copied from CRM [id number]. Topic: General - Other >> Feb 19, 2023 12:01 PM Maxwell Marion wrote: Reason for CRM: Jae Dire with [facility] called to leave a message for Dr. Drue Novel regarding this patient. Wanted to let him know patient is really weak, can hardly stand and is urinating frequently. She did leave a specimen cup with the patient, thinks he may possibly have UTI but unsure. No fever, vitals stable. Her call back number is [phone] if there are any questions.

## 2023-02-19 NOTE — Addendum Note (Signed)
Addended byConrad [place] D on: 02/19/2023 03:20 PM   Modules accepted: Orders

## 2023-02-19 NOTE — Telephone Encounter (Signed)
PC received from Liji, PT with [facility], she is requesting a verbal order from Dr Cherly Hensen for nursing home health visits for this patient.  She states he has increased skin irritation on his buttocks & is weaker since his radiation treatments.  Staff message sent to Dr Cherly Hensen for response.

## 2023-02-19 NOTE — Telephone Encounter (Signed)
Okay to send a UA urine culture.  I have not seen him since he was admitted to the hospital last month.  Arrange office visit, virtual okay, with me or another provider to see how his doing.  If the patient or the family feel that he is doing very poorly then he may need to go to the ER (fever, increased lethargy, mental status changes etc.)

## 2023-02-19 NOTE — Telephone Encounter (Signed)
Spoke w/ Jae Dire- informed of PCP recommendations.    Spoke w/ Charlie Pitter- informed orders have been placed to order UA or urine culture. Joe, Pt's son will drop off urine sample tomorrow morning. Charlie Pitter will call next week about possibly setting up a virtual visit- Pt has radiation therapy next week and makes Pt very weak. Denies mental status changes or fever.

## 2023-02-19 NOTE — Telephone Encounter (Signed)
Looks like Pt is under oncology care for prostate cancer

## 2023-02-20 ENCOUNTER — Telehealth: Payer: Self-pay

## 2023-02-20 NOTE — Telephone Encounter (Signed)
Per Dr. Cherly Hensen, TC to Liji at [facility] to give VO for home nursing visits d/t report of skin irritation to buttocks and generalized weakness from radiation tx.

## 2023-02-21 ENCOUNTER — Telehealth: Payer: Self-pay | Admitting: *Deleted

## 2023-02-21 ENCOUNTER — Other Ambulatory Visit: Payer: Self-pay

## 2023-02-21 ENCOUNTER — Other Ambulatory Visit (HOSPITAL_BASED_OUTPATIENT_CLINIC_OR_DEPARTMENT_OTHER): Payer: Self-pay

## 2023-02-21 ENCOUNTER — Other Ambulatory Visit: Payer: Self-pay | Admitting: Cardiology

## 2023-02-21 DIAGNOSIS — R630 Anorexia: Secondary | ICD-10-CM

## 2023-02-21 MED ORDER — DRONABINOL 5 MG PO CAPS
5.0000 mg | ORAL_CAPSULE | Freq: Two times a day (BID) | ORAL | 0 refills | Status: DC
  Filled 2023-02-21 – 2023-02-24 (×2): qty 60, 30d supply, fill #0

## 2023-02-21 NOTE — Telephone Encounter (Signed)
Wife called back and says [facility] can administer IVF at home.  RN called [facility] at [phone] to see what orders they need. They need patient to have a line and be set up with a pharmacy.  Message to scheduler to arrange IVF here next week.

## 2023-02-21 NOTE — Progress Notes (Signed)
Called wife twice but no answer. Called son and he was at work. Recommend talking to sister. Spoke to [place]. Discussed status of the patient. They concern about dehydration. Recommend increase oral fluid as able. Given also low oral intake, trial of Marinol. 5mg  twice daily [facility]. Increase oral fluid throughout the day.   Please schedule IVF any day next week.   Add a telephone visit at 11:10 on Monday with me. Thank you

## 2023-02-21 NOTE — Telephone Encounter (Signed)
Wife is concerned that patient is getting dehydrated. HH nurse is coming weekly to evaluate and he is getting PT as well. She is going to ask HH if they can administer IVF at home as they are not able to get him into the office.   Will call us Monday to let us know how he is doing.

## 2023-02-23 ENCOUNTER — Other Ambulatory Visit (HOSPITAL_BASED_OUTPATIENT_CLINIC_OR_DEPARTMENT_OTHER): Payer: Self-pay

## 2023-02-24 ENCOUNTER — Telehealth: Payer: Self-pay

## 2023-02-24 ENCOUNTER — Inpatient Hospital Stay: Payer: Medicare Other

## 2023-02-24 ENCOUNTER — Other Ambulatory Visit (HOSPITAL_BASED_OUTPATIENT_CLINIC_OR_DEPARTMENT_OTHER): Payer: Self-pay

## 2023-02-24 DIAGNOSIS — E860 Dehydration: Secondary | ICD-10-CM | POA: Diagnosis not present

## 2023-02-24 DIAGNOSIS — G893 Neoplasm related pain (acute) (chronic): Secondary | ICD-10-CM

## 2023-02-24 DIAGNOSIS — Z9189 Other specified personal risk factors, not elsewhere classified: Secondary | ICD-10-CM

## 2023-02-24 DIAGNOSIS — Z79899 Other long term (current) drug therapy: Secondary | ICD-10-CM | POA: Insufficient documentation

## 2023-02-24 DIAGNOSIS — C61 Malignant neoplasm of prostate: Secondary | ICD-10-CM | POA: Insufficient documentation

## 2023-02-24 DIAGNOSIS — C7951 Secondary malignant neoplasm of bone: Secondary | ICD-10-CM | POA: Insufficient documentation

## 2023-02-24 DIAGNOSIS — R630 Anorexia: Secondary | ICD-10-CM | POA: Insufficient documentation

## 2023-02-24 NOTE — Assessment & Plan Note (Signed)
Max Tylenol 3000 mg daily. Trial of 1/2 tab of Percocet as needed twice daily.  Continue dexamethasone.  Currently 2 mg twice daily.  Follow-up next week. Wean Gabapentin 300 mg daily x 3 days then stop.

## 2023-02-24 NOTE — Telephone Encounter (Signed)
Physician orders (order number: [id number]) signed and faxed back to [facility] at [phone]. Form sent for scanning.

## 2023-02-24 NOTE — Progress Notes (Signed)
Patient Care Team: William Plump, MD as PCP - General (Internal Medicine) William Som Madolyn Frieze, MD as PCP - Cardiology (Cardiology) William Som Madolyn Frieze, MD as Consulting Physician (Cardiology) William Low, MD as Consulting Physician (Orthopedic Surgery) William Luz, MD as Consulting Physician (Physical Medicine and Rehabilitation) William Chimes, MD as Consulting Physician (Ophthalmology) William Hick, MD as Consulting Physician (Urology) William Cushing, RN as Oncology Nurse Navigator [facility], William Baumgartner, NP as Nurse Practitioner [facility] and Palliative Medicine)  Clinic Day:  02/24/2023  Referring physician: Wanda Plump, MD  ASSESSMENT & PLAN:   Assessment & Plan: William [facility] is a 89 y.o.male with history of CAD, CVA, hypertension, hyperlipidemia, BPH being follow up for prostate cancer.  Patient requested telephone visit today.  Patient is at home with wife today.   Location of the provider: [facility] Location of the patient: home   Current diagnosis: mHSPC with diffuse bone metastases Initial diagnosis: mHSPC iPSA 261 Germline testing: Will refer in the future Somatic testing: Not yet Treatment: Apalutamide 02/01/23.  Prostate cancer (HCC) Continue apalutamide  Start ADT in near future. Given fatigue from radiation, will readdress next month CBC, CMP, PSA and testosterone in Feb  At risk for side effect of medication baseline bone mineral density study calcium (1000-1200 mg daily from food and supplements) and vitamin D3 (1000 IU daily) Zometa after dental clearance.  He has not seen dentist for 3 years.  Will be looking for a dentist before starting 4 mg every 3 months. Control and prevent diabetes Aggressive cardiovascular risk management Once pain improved can start exercises  Limit alcohol consumption and avoid smoking  Dehydration Discussed importance of increase oral intake and fluid intake IV fluid this week.  Cancer related pain Max Tylenol 3000 mg  daily. Trial of 1/2 tab of Percocet as needed twice daily.  Continue dexamethasone.  Currently 2 mg twice daily.  Follow-up next week. Wean Gabapentin 300 mg daily x 3 days then stop.  Decreased appetite Start marinol 5 mg twice daily   Increase fluid to 60 oz fluid.  Return tomorrow for infusion.   The patient understands the plans discussed today and is in agreement with them.  He knows to contact our office if he develops concerns prior to his next appointment.  William Sartorius, MD  [facility] - A DEPT OF [facility] [address] [place] [address] Dept: [phone] Dept Fax: [phone]     Total time 38 minutes.  CHIEF COMPLAINT:  CC: prostate cancer  Current Treatment:  apa  INTERVAL HISTORY:  [name] is with wife at home today. Report drinking a bit over the weekend. He like smart water. Report skin sore and seen by home nurse last week and having butt paste on it. He has hard time getting up due to weakness in the back.   Back pain is at the same place as before about the same. 4-5/10. Sometimes 2/10. Report incontinence. Report of appetite is a little better with some oatmeal, soup.   He is taking apalutamide daily. No missing dose.   He completed radiation on 1/20. He is dexamethasone 2 mg twice daily.  I have reviewed the past medical history, past surgical history, social history and family history with the patient and they are unchanged from previous note.  ALLERGIES:  is allergic to atorvastatin, celecoxib, sulfonamide derivatives, and zolpidem tartrate.  MEDICATIONS:  Current Outpatient Medications  Medication Sig  Dispense Refill   apalutamide (ERLEADA) 60 MG tablet Take 3 tablets (180 mg total) by mouth daily. May discontinue bicalutamide when starts apalutamide 90 tablet 11   aspirin 81 MG tablet Take 81 mg by mouth daily.     augmented betamethasone dipropionate (DIPROLENE-AF) 0.05  % cream Apply topically 2 (two) times daily. 60 g 0   cholecalciferol (VITAMIN D) 1000 UNITS tablet Take 1,000 Units by mouth daily.     Cyanocobalamin ([employer] VITAMIN B-12) 5000 MCG SUBL Take 5,000 mcg by mouth daily. (Patient not taking: Reported on 09/18/2022)     dexamethasone (DECADRON) 2 MG tablet Take 1 tablet (2 mg total) by mouth 2 (two) times daily with a meal. 60 tablet 0   dronabinol (MARINOL) 5 MG capsule Take 1 capsule (5 mg total) by mouth 2 (two) times daily before a meal. 60 capsule 0   feeding supplement (ENSURE ENLIVE / ENSURE PLUS) LIQD Take 237 mLs by mouth 2 (two) times daily between meals. 14220 mL 0   gabapentin (NEURONTIN) 300 MG capsule Take 1 capsule (300 mg total) by mouth 2 (two) times daily.     insulin aspart (NOVOLOG) 100 UNIT/ML injection Inject 0-9 Units into the skin 3 (three) times daily with meals. Sliding scale CBG 70 - 120: 0 units CBG 121 - 150: 1 unit,  CBG 151 - 200: 2 units,  CBG 201 - 250: 3 units,  CBG 251 - 300: 5 units,  CBG 301 - 350: 7 units,  CBG 351 - 400: 9 units   CBG > 400: 9 units and notify your MD     ketoconazole (NIZORAL) 2 % cream Apply 1 application topically daily. 30 g 0   nystatin (MYCOSTATIN/NYSTOP) powder Apply topically 3 (three) times daily. Apply to affected area.     pantoprazole (PROTONIX) 40 MG tablet Take 1 tablet (40 mg total) by mouth daily at 6 (six) AM.     polyethylene glycol powder (MIRALAX) 17 GM/SCOOP powder Take 17 g by mouth daily. For constipation.     propranolol (INDERAL) 40 MG tablet TAKE 1 TABLET BY MOUTH DAILY (Patient taking differently: Take 40 mg by mouth daily.) 90 tablet 0   rosuvastatin (CRESTOR) 20 MG tablet TAKE 1 TABLET BY MOUTH DAILY (Patient taking differently: Take 20 mg by mouth daily.) 90 tablet 3   senna-docusate (SENOKOT-S) 8.6-50 MG tablet Take 1 tablet by mouth 2 (two) times daily between meals as needed for mild constipation.     timolol (TIMOPTIC) 0.5 % ophthalmic solution Place 1 drop into both  eyes every morning. As directed     No current facility-administered medications for this visit.    HISTORY OF PRESENT ILLNESS:   Oncology History  Prostate cancer (HCC)  01/18/2023 Imaging   CTA CAP 1. Negative for Aortic Dissection. Stable mild fusiform aneurysmal enlargement of the ascending aorta. Severe Aortic Atherosclerosis (ICD10-I70.0).   2. Positive for a subacute and pathologic appearing T12 compression fracture, with underlying lytic lesion and loss of height progressed since November. No retropulsion or other complicating features. In conjunction with November CT findings of skeletal metastatic disease, abnormal bladder base and prostate as detailed on that exam.   3. No other acute finding in the Chest, Abdomen, or Pelvis. Cholelithiasis.   01/18/2023 Imaging   MRI T spine 1. Multiple metastatic deposits throughout the thoracic spine as described. 2. No pathologic fractures. 3. Central disc protrusion at T8-9 effaces the ventral CSF. 4. Rightward disc protrusion and annular tear at T11-12  without focal stenosis   01/20/2023 Imaging   Bone survey IMPRESSION: 1. Patient with known spinal osseous bone lesions. This was better assessed on recent CT and MRI. The majority of these lesions are not well-defined by radiograph. 2. The known T12 compression fracture is not well assessed on the current exam due to osseous overlap. 3. The known left iliac sclerotic lesion is not well seen by radiograph. 4. Area of sclerosis projects over the lateral aspect of the right distal tibia at the ankle joint, indeterminate.   01/20/2023 Tumor Marker   PSA 261   01/21/2023 Initial Diagnosis   Prostate cancer (HCC)   01/21/2023 Pathology Results   BONE, T12, BIOPSY:  - Metastatic carcinoma to bone, consistent with a primary prostatic carcinoma   Started bicalutamide while inpatient   01/27/2023 Cancer Staging   Staging form: Prostate, AJCC 8th Edition - Clinical  stage from 01/27/2023: Stage IVB (cTX, cNX, pM1b, PSA: 261) - Signed by William Sartorius, MD on 01/27/2023 Stage prefix: Initial diagnosis Prostate specific antigen (PSA) range: 20 or greater   02/01/2023 -  Chemotherapy   Start apalutamide   02/10/2023 Tumor Marker   PSA 27.8       REVIEW OF SYSTEMS:   All relevant systems were reviewed with the patient and are negative.   VITALS:  There were no vitals taken for this visit.  Wt Readings from Last 3 Encounters:  01/18/23 184 lb (83.5 kg)  09/18/22 200 lb 4 oz (90.8 kg)  01/08/22 204 lb (92.5 kg)    There is no height or weight on file to calculate BMI.  Performance status (ECOG): 3 - Symptomatic, >50% confined to bed  PHYSICAL EXAM:  NA  LABORATORY DATA:  I have reviewed the data as listed    Component Value Date/Time   NA 137 02/10/2023 1518   NA 140 01/08/2022 0911   K 4.1 02/10/2023 1518   CL 104 02/10/2023 1518   CO2 27 02/10/2023 1518   GLUCOSE 146 (H) 02/10/2023 1518   BUN 29 (H) 02/10/2023 1518   BUN 33 (H) 01/08/2022 0911   CREATININE 1.18 02/10/2023 1518   CREATININE 1.09 10/30/2015 1211   CALCIUM 9.3 02/10/2023 1518   PROT 6.3 (L) 02/10/2023 1518   PROT 6.5 01/08/2022 0911   ALBUMIN 3.5 02/10/2023 1518   ALBUMIN 4.2 01/08/2022 0911   AST 14 (L) 02/10/2023 1518   ALT 10 02/10/2023 1518   ALKPHOS 109 02/10/2023 1518   BILITOT 0.5 02/10/2023 1518   GFRNONAA >60 02/10/2023 1518   GFRNONAA 64 12/08/2013 1222   GFRAA 58 (L) 12/01/2019 1051   GFRAA 74 12/08/2013 1222    No results found for: "SPEP", "UPEP"  Lab Results  Component Value Date   WBC 8.6 02/10/2023   NEUTROABS 6.8 02/10/2023   HGB 13.5 02/10/2023   HCT 42.8 02/10/2023   MCV 89.4 02/10/2023   PLT 211 02/10/2023      Chemistry      Component Value Date/Time   NA 137 02/10/2023 1518   NA 140 01/08/2022 0911   K 4.1 02/10/2023 1518   CL 104 02/10/2023 1518   CO2 27 02/10/2023 1518   BUN 29 (H) 02/10/2023 1518   BUN 33 (H)  01/08/2022 0911   CREATININE 1.18 02/10/2023 1518   CREATININE 1.09 10/30/2015 1211      Component Value Date/Time   CALCIUM 9.3 02/10/2023 1518   ALKPHOS 109 02/10/2023 1518   AST 14 (L) 02/10/2023 1518  ALT 10 02/10/2023 1518   BILITOT 0.5 02/10/2023 1518       RADIOGRAPHIC STUDIES: I have personally reviewed the radiological images as listed and agreed with the findings in the report. No results found.

## 2023-02-24 NOTE — Telephone Encounter (Signed)
Received VM from pt's wife concerning pt's Marinol Rx that was ordered by Dr. Cherly Hensen today. She states that pharmacy told her that it would take several days for this medication to be preauthorized, and she would like this process expedited if possible. TC to [facility]; was told that pt's insurance is requiring pre-authorization for this medication. TC to PA department at [phone]. Spoke w/ Lisbeth Ply T-- answered PA questions. He states pt's pharmacy will be notified if the medication has been approved or denied. Reference number for call given: [id number].

## 2023-02-24 NOTE — Assessment & Plan Note (Signed)
Start marinol 5 mg twice daily

## 2023-02-24 NOTE — Assessment & Plan Note (Signed)
baseline bone mineral density study calcium (1000-1200 mg daily from food and supplements) and vitamin D3 (1000 IU daily) Zometa after dental clearance.  He has not seen dentist for 3 years.  Will be looking for a dentist before starting 4 mg every 3 months. Control and prevent diabetes Aggressive cardiovascular risk management Once pain improved can start exercises  Limit alcohol consumption and avoid smoking

## 2023-02-24 NOTE — Telephone Encounter (Signed)
Plan of care signed and faxed back to [facility] at [phone]. Form sent for scanning.

## 2023-02-24 NOTE — Assessment & Plan Note (Signed)
 Continue apalutamide  Start ADT in near future. Given fatigue from radiation, will readdress next month CBC, CMP, PSA and testosterone in Feb

## 2023-02-24 NOTE — Assessment & Plan Note (Signed)
Discussed importance of increase oral intake and fluid intake IV fluid this week.

## 2023-02-25 ENCOUNTER — Other Ambulatory Visit (HOSPITAL_COMMUNITY): Payer: Self-pay

## 2023-02-25 ENCOUNTER — Inpatient Hospital Stay: Payer: Medicare Other

## 2023-02-25 ENCOUNTER — Other Ambulatory Visit: Payer: Self-pay

## 2023-02-25 ENCOUNTER — Other Ambulatory Visit (HOSPITAL_BASED_OUTPATIENT_CLINIC_OR_DEPARTMENT_OTHER): Payer: Self-pay

## 2023-02-25 VITALS — BP 101/55 | HR 78 | Temp 97.6°F | Resp 16

## 2023-02-25 DIAGNOSIS — C61 Malignant neoplasm of prostate: Secondary | ICD-10-CM | POA: Diagnosis present

## 2023-02-25 DIAGNOSIS — C7951 Secondary malignant neoplasm of bone: Secondary | ICD-10-CM | POA: Diagnosis present

## 2023-02-25 DIAGNOSIS — Z79899 Other long term (current) drug therapy: Secondary | ICD-10-CM | POA: Diagnosis not present

## 2023-02-25 MED ORDER — SODIUM CHLORIDE 0.9 % IV SOLN: Freq: Once | INTRAVENOUS | Status: AC

## 2023-02-25 NOTE — Patient Instructions (Signed)

## 2023-02-26 ENCOUNTER — Emergency Department (HOSPITAL_COMMUNITY)
Admission: EM | Admit: 2023-02-26 | Discharge: 2023-02-26 | Disposition: A | Discharge status: Home / Self Care | Payer: Medicare Other | Attending: Emergency Medicine | Admitting: Emergency Medicine

## 2023-02-26 ENCOUNTER — Telehealth: Payer: Self-pay

## 2023-02-26 ENCOUNTER — Emergency Department (HOSPITAL_COMMUNITY): Payer: Medicare Other

## 2023-02-26 ENCOUNTER — Other Ambulatory Visit: Payer: Self-pay

## 2023-02-26 DIAGNOSIS — R531 Weakness: Secondary | ICD-10-CM | POA: Insufficient documentation

## 2023-02-26 DIAGNOSIS — C61 Malignant neoplasm of prostate: Secondary | ICD-10-CM | POA: Diagnosis not present

## 2023-02-26 DIAGNOSIS — E860 Dehydration: Secondary | ICD-10-CM | POA: Insufficient documentation

## 2023-02-26 DIAGNOSIS — N390 Urinary tract infection, site not specified: Secondary | ICD-10-CM | POA: Diagnosis not present

## 2023-02-26 DIAGNOSIS — Z20822 Contact with and (suspected) exposure to covid-19: Secondary | ICD-10-CM | POA: Insufficient documentation

## 2023-02-26 LAB — COMPREHENSIVE METABOLIC PANEL
ALT: 10 U/L (ref 0–44)
AST: 15 U/L (ref 15–41)
Albumin: 2.2 g/dL — ABNORMAL LOW (ref 3.5–5.0)
Alkaline Phosphatase: 70 U/L (ref 38–126)
Anion gap: 10 (ref 5–15)
BUN: 41 mg/dL — ABNORMAL HIGH (ref 8–23)
CO2: 20 mmol/L — ABNORMAL LOW (ref 22–32)
Calcium: 8.9 mg/dL (ref 8.9–10.3)
Chloride: 107 mmol/L (ref 98–111)
Creatinine, Ser: 1.31 mg/dL — ABNORMAL HIGH (ref 0.61–1.24)
GFR, Estimated: 53 mL/min — ABNORMAL LOW (ref >60)
Glucose, Bld: 119 mg/dL — ABNORMAL HIGH (ref 70–99)
Potassium: 4.5 mmol/L (ref 3.5–5.1)
Sodium: 137 mmol/L (ref 135–145)
Total Bilirubin: 0.5 mg/dL (ref 0.0–1.2)
Total Protein: 4.9 g/dL — ABNORMAL LOW (ref 6.5–8.1)

## 2023-02-26 LAB — URINALYSIS, ROUTINE W REFLEX MICROSCOPIC
Bilirubin Urine: NEGATIVE
Glucose, UA: NEGATIVE mg/dL
Hgb urine dipstick: NEGATIVE
Ketones, ur: NEGATIVE mg/dL
Nitrite: NEGATIVE
Protein, ur: NEGATIVE mg/dL
Specific Gravity, Urine: 1.008 (ref 1.005–1.030)
pH: 5 (ref 5.0–8.0)

## 2023-02-26 LAB — CBC WITH DIFFERENTIAL/PLATELET
Abs Immature Granulocytes: 0.05 10*3/uL (ref 0.00–0.07)
Basophils Absolute: 0 10*3/uL (ref 0.0–0.1)
Basophils Relative: 1 %
Eosinophils Absolute: 0.2 10*3/uL (ref 0.0–0.5)
Eosinophils Relative: 3 %
HCT: 35.8 % — ABNORMAL LOW (ref 39.0–52.0)
Hemoglobin: 11.4 g/dL — ABNORMAL LOW (ref 13.0–17.0)
Immature Granulocytes: 1 %
Lymphocytes Relative: 17 %
Lymphs Abs: 1.1 10*3/uL (ref 0.7–4.0)
MCH: 28.4 pg (ref 26.0–34.0)
MCHC: 31.8 g/dL (ref 30.0–36.0)
MCV: 89.3 fL (ref 80.0–100.0)
Monocytes Absolute: 0.7 10*3/uL (ref 0.1–1.0)
Monocytes Relative: 10 %
Neutro Abs: 4.4 10*3/uL (ref 1.7–7.7)
Neutrophils Relative %: 68 %
Platelets: 322 10*3/uL (ref 150–400)
RBC: 4.01 MIL/uL — ABNORMAL LOW (ref 4.22–5.81)
RDW: 14.2 % (ref 11.5–15.5)
WBC: 6.5 10*3/uL (ref 4.0–10.5)
nRBC: 0 % (ref 0.0–0.2)

## 2023-02-26 LAB — CBG MONITORING, ED: Glucose-Capillary: 106 mg/dL — ABNORMAL HIGH (ref 70–99)

## 2023-02-26 LAB — TROPONIN I (HIGH SENSITIVITY): Troponin I (High Sensitivity): 25 ng/L — ABNORMAL HIGH (ref <18)

## 2023-02-26 LAB — RESP PANEL BY RT-PCR (RSV, FLU A&B, COVID)  RVPGX2
Influenza A by PCR: NEGATIVE
Influenza B by PCR: NEGATIVE
Resp Syncytial Virus by PCR: NEGATIVE
SARS Coronavirus 2 by RT PCR: NEGATIVE

## 2023-02-26 MED ORDER — CEPHALEXIN 500 MG PO CAPS: 500.0000 mg | ORAL_CAPSULE | Freq: Three times a day (TID) | ORAL | 0 refills | Status: AC

## 2023-02-26 MED ORDER — CEPHALEXIN 250 MG PO CAPS
500.0000 mg | ORAL_CAPSULE | Freq: Once | ORAL | Status: AC
  Administered 2023-02-26: 500 mg via ORAL
  Filled 2023-02-26: qty 2

## 2023-02-26 MED ORDER — LACTATED RINGERS IV BOLUS
1000.0000 mL | Freq: Once | INTRAVENOUS | Status: AC
  Administered 2023-02-26: 1000 mL via INTRAVENOUS

## 2023-02-26 NOTE — Telephone Encounter (Signed)
 Physician orders (order number: [id number]) signed and faxed back to [facility] at [phone]. Form sent for scanning.

## 2023-02-26 NOTE — ED Triage Notes (Signed)
 Pt arrived via [facility] from home for Increasing weakness. Pt reports significant increasing of generalized weakness post radiation treatment 2 weeks ago, pt now unable to ambulate without significant difficulty or not at all. Actively receiving treatment for prostate cancer (radiation only). Pt saw oncologist for these complaints yesterday, provided IVF, denies improvement in symptoms. Continues to report fatigue, generalized weakness.  500cc via 18g LAC  BP 80/42->104/43 HR 80 SPO2 98% RA CBG 138

## 2023-02-26 NOTE — ED Notes (Signed)
Mepelex applied, external catheter placed. Pt reports since radiation treatment he has lost control of bladder.

## 2023-02-26 NOTE — ED Provider Notes (Signed)
 [place] [facility] Provider Note   CSN: [id number] Arrival date & time: 02/26/23  1629     History Chief Complaint  Patient presents with   Weakness    HPI William Faulkner is a 89 y.o. male presenting for chief complaint of altered mental status and generalized weakness.  States that he has had very vague generalized symptoms for the last 2 weeks since his last prostate cancer radiation therapy. He denies fevers chills nausea vomiting syncope or shortness of breath.  He is otherwise ambulatory tolerating p.o. intake.  No known sick contacts.  Patient's recorded medical, surgical, social, medication list and allergies were reviewed in the Snapshot window as part of the initial history.   Review of Systems   Review of Systems  Constitutional:  Negative for chills and fever.  HENT:  Negative for ear pain and sore throat.   Eyes:  Negative for pain and visual disturbance.  Respiratory:  Negative for cough and shortness of breath.   Cardiovascular:  Negative for chest pain and palpitations.  Gastrointestinal:  Negative for abdominal pain and vomiting.  Genitourinary:  Negative for dysuria and hematuria.  Musculoskeletal:  Negative for arthralgias and back pain.  Skin:  Negative for color change and rash.  Neurological:  Negative for seizures and syncope.  All other systems reviewed and are negative.   Physical Exam Updated Vital Signs BP (!) 117/94   Pulse 67   Temp 98.4 F (36.9 C) (Oral)   Resp 18   Ht 6' 2 (1.88 m)   Wt 79.4 kg   SpO2 100%   BMI 22.47 kg/m  Physical Exam Vitals and nursing note reviewed.  Constitutional:      General: He is not in acute distress.    Appearance: He is well-developed.  HENT:     Head: Normocephalic and atraumatic.  Eyes:     Conjunctiva/sclera: Conjunctivae normal.  Cardiovascular:     Rate and Rhythm: Normal rate and regular rhythm.     Heart sounds: No murmur heard. Pulmonary:      Effort: Pulmonary effort is normal. No respiratory distress.     Breath sounds: Normal breath sounds.  Abdominal:     Palpations: Abdomen is soft.     Tenderness: There is no abdominal tenderness.  Musculoskeletal:        General: No swelling.     Cervical back: Neck supple.  Skin:    General: Skin is warm and dry.     Capillary Refill: Capillary refill takes less than 2 seconds.  Neurological:     Mental Status: He is alert.  Psychiatric:        Mood and Affect: Mood normal.      ED Course/ Medical Decision Making/ A&P    Procedures Procedures   Medications Ordered in ED Medications  cephALEXin  (KEFLEX ) capsule 500 mg (has no administration in time range)  lactated ringers  bolus 1,000 mL (0 mLs Intravenous Stopped 02/26/23 1800)   Medical Decision Making:   William Faulkner is a 89 y.o. male who presented to the ED today with altered mental status detailed above.    Patient placed on continuous vitals and telemetry monitoring while in ED which was reviewed periodically.  Complete initial physical exam performed, notably the patient  was HDS in NAD .    Reviewed and confirmed nursing documentation for past medical history, family history, social history.    Initial Assessment:   With the patient's presentation of  altered mental status, most likely diagnosis is delerium 2/2 infectious etiology (UTI/CAP/URI) vs metabolic abnormality (Na/K/Mg/Ca) vs nonspecific etiology. Other diagnoses were considered including (but not limited to) CVA, ICH, intracranial mass, critical dehydration, heptatic dysfunction, uremia, hypercarbia, intoxication, endrocrine abnormality, toxidrome. These are considered less likely due to history of present illness and physical exam findings.   This is most consistent with an acute life/limb threatening illness complicated by underlying chronic conditions.  Initial Plan:  Screening labs including CBC and Metabolic panel to evaluate for infectious or  metabolic etiology of disease.  Urinalysis with reflex culture ordered to evaluate for UTI or relevant urologic/nephrologic pathology.  CXR to evaluate for structural/infectious intrathoracic pathology.  Drug screen for toxidrome evaluation EKG to evaluate for cardiac pathology Objective evaluation as below reviewed   Initial Study Results:   Laboratory  All laboratory results reviewed without evidence of clinically relevant pathology.    EKG EKG was reviewed independently. Rate, rhythm, axis, intervals all examined and without medically relevant abnormality. ST segments without concerns for elevations.    Radiology:  All images reviewed independently. Agree with radiology report at this time.   DG Chest Portable 1 View Result Date: 02/26/2023 CLINICAL DATA:  Altered mental status EXAM: PORTABLE CHEST 1 VIEW COMPARISON:  01/23/2023 FINDINGS: Previous median sternotomy and CABG. Right lung is clear. Mild scarring or atelectasis at the left lung base. No visible effusion. No abnormal bone finding. IMPRESSION: Previous median sternotomy and CABG. Mild scarring or atelectasis at the left lung base. Electronically Signed   By: Oneil Officer M.D.   On: 02/26/2023 18:21   Final Assessment and Plan:   Patient completely resolved after IV fluid.  Evidence of AKI though very mild.  Treated with IV fluid.  He does have leukocyturia and bacteriuria.  Will treat with Keflex  recommend follow-up with PCP.  Patient very motivated to go home.  He had a short episode of A-fib captured on EKG but it resolved back to normal sinus rhythm spontaneously.  History of paroxysmal A-fib per my conversation with him initially.  No further change in treatment at this time.  Recommend he follow-up with his PCP regarding need for anticoagulation. Disposition:  I have considered need for hospitalization, however, considering all of the above, I believe this patient is stable for discharge at this time.  Patient/family  educated about specific return precautions for given chief complaint and symptoms.  Patient/family educated about follow-up with PCP.     Patient/family expressed understanding of return precautions and need for follow-up. Patient spoken to regarding all imaging and laboratory results and appropriate follow up for these results. All education provided in verbal form with additional information in written form. Time was allowed for answering of patient questions. Patient discharged.    Emergency Department Medication Summary:   Medications  cephALEXin  (KEFLEX ) capsule 500 mg (has no administration in time range)  lactated ringers  bolus 1,000 mL (0 mLs Intravenous Stopped 02/26/23 1800)          Clinical Impression:  1. Dehydration   2. Weakness   3. Urinary tract infection without hematuria, site unspecified      Discharge   Final Clinical Impression(s) / ED Diagnoses Final diagnoses:  Dehydration  Weakness  Urinary tract infection without hematuria, site unspecified    Rx / DC Orders ED Discharge Orders          Ordered    cephALEXin  (KEFLEX ) 500 MG capsule  3 times daily  02/26/23 2113              Jerral Meth, MD 02/26/23 2119

## 2023-02-28 ENCOUNTER — Other Ambulatory Visit: Payer: Self-pay

## 2023-02-28 NOTE — Progress Notes (Signed)
 Specialty Pharmacy Ongoing Clinical Assessment Note  William Faulkner is a 89 y.o. male who is being followed by the specialty pharmacy service for RxSp Oncology   Patient's specialty medication(s) reviewed today: Apalutamide  (ERLEADA )   Missed doses in the last 4 weeks: 0   Patient/Caregiver did not have any additional questions or concerns.   Therapeutic benefit summary: Unable to assess   Adverse events/side effects summary: Experienced adverse events/side effects (fatigue)   Patient's therapy is appropriate to: Continue    Goals Addressed             This Visit's Progress    Improve or maintain quality of life   On track    Patient is initiating therapy. Patient will maintain adherence         Follow up:  3 months  [name] M Vikki Gains Specialty Pharmacist

## 2023-02-28 NOTE — Progress Notes (Deleted)
 Palliative Medicine [facility]  [phone] [phone]   Name: ARMSTRONG CREASY Date: 02/28/2023 MRN: [id number]  DOB: 1934-07-18  Patient Care Team: Wanda Plump, MD as PCP - General (Internal Medicine) [name] Madolyn Frieze, MD as PCP - Cardiology (Cardiology) [name] Madolyn Frieze, MD as Consulting Physician (Cardiology) Beverely Low, MD as Consulting Physician (Orthopedic Surgery) Sheran Luz, MD as Consulting Physician (Physical Medicine and Rehabilitation) Nelson Chimes, MD as Consulting Physician (Ophthalmology) Jannifer Hick, MD as Consulting Physician (Urology) Cherlyn Cushing, RN as Oncology Nurse Navigator Pickenpack-Cousar, Arty Baumgartner, NP as Nurse Practitioner [facility] and Palliative Medicine)    INTERVAL HISTORY: William Faulkner is a 89 y.o. male with oncologic medical history including prostate cancer (12/2022) with metastatic disease to the bone. Palliative ask to see for symptom management and goals of care.   SOCIAL HISTORY:     reports that he quit smoking about 39 years ago. His smoking use included cigarettes. He started smoking about 54 years ago. He has a 4.5 pack-year smoking history. He has never used smokeless tobacco. He reports that he does not drink alcohol and does not use drugs.  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  MEDICATIONS:  Current Outpatient Medications   Medication Sig Dispense Refill   apalutamide (ERLEADA) 60 MG tablet Take 3 tablets (180 mg total) by mouth daily. May discontinue bicalutamide when starts apalutamide 90 tablet 11   aspirin 81 MG tablet Take 81 mg by mouth daily.     augmented betamethasone dipropionate (DIPROLENE-AF) 0.05 % cream Apply topically 2 (two) times daily. 60 g 0   cephALEXin (KEFLEX) 500 MG capsule Take 1 capsule (500 mg total) by mouth 3 (three) times daily for 5 days. 15 capsule 0   cholecalciferol (VITAMIN D) 1000 UNITS tablet Take 1,000 Units by mouth daily.     Cyanocobalamin ([employer] VITAMIN B-12) 5000 MCG SUBL Take 5,000 mcg by mouth daily. (Patient not taking: Reported on 09/18/2022)     dexamethasone (DECADRON) 2 MG tablet Take 1 tablet (2 mg total) by mouth 2 (two) times daily with a meal. 60 tablet 0   dronabinol (MARINOL) 5 MG capsule Take 1 capsule (5 mg total) by mouth 2 (two) times daily before a meal. 60 capsule 0   feeding supplement (ENSURE ENLIVE / ENSURE PLUS) LIQD Take 237 mLs by mouth 2 (two) times daily between meals. 14220 mL 0   gabapentin (NEURONTIN) 300 MG capsule Take 1 capsule (300 mg total) by mouth 2 (two) times daily.     insulin aspart (NOVOLOG) 100 UNIT/ML injection Inject 0-9 Units into the skin 3 (three) times daily with meals. Sliding scale CBG 70 - 120: 0 units CBG 121 - 150: 1 unit,  CBG 151 - 200: 2 units,  CBG 201 - 250: 3 units,  CBG 251 - 300: 5  units,  CBG 301 - 350: 7 units,  CBG 351 - 400: 9 units   CBG > 400: 9 units and notify your MD     ketoconazole (NIZORAL) 2 % cream Apply 1 application topically daily. 30 g 0   nystatin (MYCOSTATIN/NYSTOP) powder Apply topically 3 (three) times daily. Apply to affected area.     pantoprazole (PROTONIX) 40 MG tablet Take 1 tablet (40 mg total) by mouth daily at 6 (six) AM.     polyethylene glycol powder (MIRALAX) 17 GM/SCOOP powder Take 17 g by mouth daily. For constipation.     propranolol (INDERAL) 40 MG tablet TAKE 1 TABLET BY MOUTH  DAILY (Patient taking differently: Take 40 mg by mouth daily.) 90 tablet 0   rosuvastatin (CRESTOR) 20 MG tablet TAKE 1 TABLET BY MOUTH DAILY 30 tablet 0   senna-docusate (SENOKOT-S) 8.6-50 MG tablet Take 1 tablet by mouth 2 (two) times daily between meals as needed for mild constipation.     timolol (TIMOPTIC) 0.5 % ophthalmic solution Place 1 drop into both eyes every morning. As directed     No current facility-administered medications for this visit.    VITAL SIGNS: There were no vitals taken for this visit. There were no vitals filed for this visit.  Estimated body mass index is 22.47 kg/m as calculated from the following:   Height as of 02/26/23: 6\' 2"  (1.88 m).   Weight as of 02/26/23: 175 lb (79.4 kg).   PERFORMANCE STATUS (ECOG) : 1 - Symptomatic but completely ambulatory   Physical Exam General: NAD Cardiovascular: regular rate and rhythm Pulmonary: clear ant fields Abdomen: soft, nontender, + bowel sounds Extremities: no edema, no joint deformities Skin: no rashes Neurological:   IMPRESSION: ***  We discussed Her current illness and what it means in the larger context of Her on-going co-morbidities. Natural disease trajectory and expectations were discussed.  I discussed the importance of continued conversation with family and their medical providers regarding overall plan of care and treatment options, ensuring decisions are within the context of the patients values and GOCs.  PLAN: Established therapeutic relationship. Education provided on palliative's role in collaboration with their Oncology/Radiation team.  Cancer-related Pain Pain localized above the right hip, rated as 4/10 at worst in the past week and 3/10 currently. Pain is managed with dexamethasone, extra strength Tylenol, and gabapentin. The goal is to reduce pain to a level of 1-2/10. -Continue current pain management regimen. -Continue Tylenol ES every 8 hours as needed -Continue Gabapentin 300mg   twice daily -Continue dexamethasone as prescribed. -Communicate any changes in pain levels to the medical team promptly.  Physical Therapy Patient is currently undergoing physical therapy sessions to improve strength and mobility. -Continue with physical therapy sessions. -Encourage patient to continue with exercises at home and outside of scheduled sessions. He is anxiously awaiting to return home. Discussed what care would look like at home after discharging from SNF. Family anticipating disciplinary meetings at [facility] in the next week to establish plan of care focusing on transition him back to his home with support.   Cancer Treatment Patient is currently undergoing radiation therapy and taking Erleada. -Continue with radiation therapy and Erleada as scheduled and managed by Dr. Cherly Hensen and Dr. Kathrynn Running.    Patient expressed understanding and was in agreement with this plan. He also understands that He can call the clinic at any time with any questions, concerns, or complaints.   Any controlled substances utilized were prescribed in the context of palliative care.  PDMP has been reviewed.    Visit consisted of counseling and education dealing with the complex and emotionally intense issues of symptom management and palliative care in the setting of serious and potentially life-threatening illness.  Willette Alma, AGPCNP-BC  Palliative Medicine Team/[facility]

## 2023-02-28 NOTE — Progress Notes (Signed)
 Specialty Pharmacy Refill Coordination Note  William Faulkner is a 89 y.o. male contacted today regarding refills of specialty medication(s) Apalutamide  (ERLEADA )   Patient requested [name] at [facility] at [place] date: 03/03/23   Medication will be filled on 02/28/23.

## 2023-03-01 ENCOUNTER — Other Ambulatory Visit (HOSPITAL_BASED_OUTPATIENT_CLINIC_OR_DEPARTMENT_OTHER): Payer: Self-pay

## 2023-03-02 ENCOUNTER — Other Ambulatory Visit: Payer: Self-pay

## 2023-03-02 DIAGNOSIS — C61 Malignant neoplasm of prostate: Secondary | ICD-10-CM

## 2023-03-02 DIAGNOSIS — R630 Anorexia: Secondary | ICD-10-CM

## 2023-03-02 DIAGNOSIS — G893 Neoplasm related pain (acute) (chronic): Secondary | ICD-10-CM

## 2023-03-02 DIAGNOSIS — E860 Dehydration: Secondary | ICD-10-CM

## 2023-03-02 DIAGNOSIS — S22080S Wedge compression fracture of T11-T12 vertebra, sequela: Secondary | ICD-10-CM

## 2023-03-02 NOTE — Assessment & Plan Note (Addendum)
Continue apalutamide  Start ADT in near future if performance status improves. Given continued fatigue from radiation, will readdress next month CBC, CMP, PSA and testosterone when return tomorrow.

## 2023-03-02 NOTE — Assessment & Plan Note (Signed)
 Continue apalutamide   Start ADT in near future. Given fatigue from radiation, will readdress next month CBC, CMP, PSA and testosterone  in March

## 2023-03-02 NOTE — Assessment & Plan Note (Signed)
 Discussed importance of increase oral intake and fluid intake IV fluid this week.

## 2023-03-02 NOTE — Progress Notes (Signed)
Patient Care Team: William Plump, MD as PCP - General (Internal Medicine) [name] Madolyn Frieze, MD as PCP - Cardiology (Cardiology) [name] Madolyn Frieze, MD as Consulting Physician (Cardiology) Beverely Low, MD as Consulting Physician (Orthopedic Surgery) Sheran Luz, MD as Consulting Physician (Physical Medicine and Rehabilitation) Nelson Chimes, MD as Consulting Physician (Ophthalmology) Jannifer Hick, MD as Consulting Physician (Urology) Cherlyn Cushing, RN as Oncology Nurse Navigator Pickenpack-Cousar, Arty Baumgartner, NP as Nurse Practitioner [facility] and Palliative Medicine)  Clinic Day:  03/04/2023  Referring physician: Wanda Plump, MD  ASSESSMENT & PLAN:   Assessment & Plan: Jayvan is a 89 y.o.male with history of CAD, CVA, hypertension, hyperlipidemia, BPH being follow up for prostate cancer.  Patient requested telephone visit today as last-minute due too weak to present to the clinic.    Locations: Patient is at home with wife today.  Physician in [facility] cancer [facility].  Wife called the last minutes that patient cannot make it due to weakness.   Spoke to patient's son, wife, and patient over the phone.  Report on he has not able to eat or drink very much.  Report of continued to have weakness.  We discussed that prostate cancer is currently being treated appropriately.  Unfortunately, if his physical performance status is declining or not improving, then cancer directed treatment may not be that helpful.  We discussed goals of care today.  We also discussed palliative care, hospice as well.  They feel like they need to discuss as a family.  We discussed continuing discussion is important.  In the meantime, again recommend increase p.o. intake, oral hydration as much as possible.  Current diagnosis: mHSPC with diffuse bone metastases Initial diagnosis: mHSPC iPSA 261 Germline testing: Will refer in the future Somatic testing: Not yet Treatment: Apalutamide 02/01/23.  Prostate cancer  (HCC) Continue apalutamide  Start ADT in near future if performance status improves. Given continued fatigue from radiation, will readdress next month CBC, CMP, PSA and testosterone when return tomorrow.  At risk for side effect of medication calcium (1000-1200 mg daily from food and supplements) and vitamin D3 (1000 IU daily) Zometa after dental clearance.  He has not seen dentist for 3 years.  Will be looking for a dentist before starting 4 mg every 3 months. Control and prevent diabetes Aggressive cardiovascular risk management Once pain improved can start exercises  Limit alcohol consumption and avoid smoking  Cancer related pain Report improved.   May continue as needed Tylenol.  Max Tylenol 3000 mg daily. Continue dexamethasone.  Currently 2 mg twice daily.  Follow-up next week.   Dehydration Discussed importance of increase oral intake and fluid intake IV fluid this week.  Decreased appetite marinol 5 mg twice daily   Repeat labs this week.  Follow-up next Thursday.  Total time 30 minutes  The patient and his family understands the plans discussed today and is in agreement with them.  He knows to contact our office if he develops concerns prior to his next appointment.  Melven Sartorius, MD  [facility] - [facility] DEPT [facility] [address] [place] [address] Dept: [phone] Dept Fax: [phone]   No orders of the defined types were placed in this encounter.     CHIEF COMPLAINT:  CC: Metastatic prostate cancer  Current Treatment: Apalutamide  INTERVAL HISTORY:  Worley is on the phone as he cannot come in.  Wife report he is too  weak.  Report he has not been eating and drinking very much.  Son over the phone.  Report he has some improvement after last week.  They have not been able to pick up Marinol yet.  Reported pain may have improved.  He has not used any narcotics.  He is using Tylenol  only as needed.   I have reviewed the past medical history, past surgical history, social history and family history with the patient and they are unchanged from previous note.  Past Medical History:  Diagnosis Date   AORTIC ANEUR UNSPEC SITE WITHOUT MENTION RUPTURE    AORTIC INSUFFICIENCY    BPH (benign prostatic hyperplasia)    CAD, ARTERY BYPASS GRAFT    Cancer (HCC)    CEREBROVASCULAR DISEASE    DIAB W/O COMP TYPE II/UNS NOT STATED UNCNTRL    Hyperlipidemia    HYPERTENSION, ESSENTIAL NOS    NEOPLASM, MALIGNANT, BLADDER, HX OF    Rotator cuff tear    Left shoulder,     ALLERGIES:  is allergic to atorvastatin, celecoxib, sulfonamide derivatives, and zolpidem tartrate.  MEDICATIONS:  Current Outpatient Medications  Medication Sig Dispense Refill   apalutamide (ERLEADA) 60 MG tablet Take 3 tablets (180 mg total) by mouth daily. May discontinue bicalutamide when starts apalutamide 90 tablet 11   aspirin 81 MG tablet Take 81 mg by mouth daily.     augmented betamethasone dipropionate (DIPROLENE-AF) 0.05 % cream Apply topically 2 (two) times daily. 60 g 0   cholecalciferol (VITAMIN D) 1000 UNITS tablet Take 1,000 Units by mouth daily.     Cyanocobalamin ([employer]S VITAMIN B-12) 5000 MCG SUBL Take 5,000 mcg by mouth daily. (Patient not taking: Reported on 09/18/2022)     dexamethasone (DECADRON) 2 MG tablet Take 1 tablet (2 mg total) by mouth 2 (two) times daily with a meal. 60 tablet 0   dronabinol (MARINOL) 5 MG capsule Take 1 capsule (5 mg total) by mouth 2 (two) times daily before a meal. 60 capsule 0   feeding supplement (ENSURE ENLIVE / ENSURE PLUS) LIQD Take 237 mLs by mouth 2 (two) times daily between meals. 14220 mL 0   gabapentin (NEURONTIN) 300 MG capsule Take 1 capsule (300 mg total) by mouth 2 (two) times daily.     insulin aspart (NOVOLOG) 100 UNIT/ML injection Inject 0-9 Units into the skin 3 (three) times daily with meals. Sliding scale CBG 70 - 120: 0 units CBG 121 - 150: 1  unit,  CBG 151 - 200: 2 units,  CBG 201 - 250: 3 units,  CBG 251 - 300: 5 units,  CBG 301 - 350: 7 units,  CBG 351 - 400: 9 units   CBG > 400: 9 units and notify your MD     ketoconazole (NIZORAL) 2 % cream Apply 1 application topically daily. 30 g 0   nystatin (MYCOSTATIN/NYSTOP) powder Apply topically 3 (three) times daily. Apply to affected area.     pantoprazole (PROTONIX) 40 MG tablet Take 1 tablet (40 mg total) by mouth daily at 6 (six) AM.     polyethylene glycol powder (MIRALAX) 17 GM/SCOOP powder Take 17 g by mouth daily. For constipation.     propranolol (INDERAL) 40 MG tablet TAKE 1 TABLET BY MOUTH DAILY (Patient taking differently: Take 40 mg by mouth daily.) 90 tablet 0   rosuvastatin (CRESTOR) 20 MG tablet TAKE 1 TABLET BY MOUTH DAILY 30 tablet 0   senna-docusate (SENOKOT-S) 8.6-50 MG tablet Take 1 tablet by mouth 2 (two)  times daily between meals as needed for mild constipation.     timolol (TIMOPTIC) 0.5 % ophthalmic solution Place 1 drop into both eyes every morning. As directed     No current facility-administered medications for this visit.    HISTORY OF PRESENT ILLNESS:   Oncology History  Prostate cancer (HCC)  01/18/2023 Imaging   CTA CAP 1. Negative for Aortic Dissection. Stable mild fusiform aneurysmal enlargement of the ascending aorta. Severe Aortic Atherosclerosis (ICD10-I70.0).   2. Positive for a subacute and pathologic appearing T12 compression fracture, with underlying lytic lesion and loss of height progressed since November. No retropulsion or other complicating features. In conjunction with November CT findings of skeletal metastatic disease, abnormal bladder base and prostate as detailed on that exam.   3. No other acute finding in the Chest, Abdomen, or Pelvis. Cholelithiasis.   01/18/2023 Imaging   MRI T spine 1. Multiple metastatic deposits throughout the thoracic spine as described. 2. No pathologic fractures. 3. Central disc protrusion at  T8-9 effaces the ventral CSF. 4. Rightward disc protrusion and annular tear at T11-12 without focal stenosis   01/20/2023 Imaging   Bone survey IMPRESSION: 1. Patient with known spinal osseous bone lesions. This was better assessed on recent CT and MRI. The majority of these lesions are not well-defined by radiograph. 2. The known T12 compression fracture is not well assessed on the current exam due to osseous overlap. 3. The known left iliac sclerotic lesion is not well seen by radiograph. 4. Area of sclerosis projects over the lateral aspect of the right distal tibia at the ankle joint, indeterminate.   01/20/2023 Tumor Marker   PSA 261   01/21/2023 Initial Diagnosis   Prostate cancer (HCC)   01/21/2023 Pathology Results   BONE, T12, BIOPSY:  - Metastatic carcinoma to bone, consistent with a primary prostatic carcinoma   Started bicalutamide while inpatient   01/27/2023 Cancer Staging   Staging form: Prostate, AJCC 8th Edition - Clinical stage from 01/27/2023: Stage IVB (cTX, cNX, pM1b, PSA: 261) - Signed by Melven Sartorius, MD on 01/27/2023 Stage prefix: Initial diagnosis Prostate specific antigen (PSA) range: 20 or greater   02/01/2023 -  Chemotherapy   Start apalutamide   02/10/2023 Tumor Marker   PSA 27.8       REVIEW OF SYSTEMS:   All relevant systems were reviewed with the patient and are negative.   VITALS:  There were no vitals taken for this visit.  Wt Readings from Last 3 Encounters:  02/26/23 175 lb (79.4 kg)  01/18/23 184 lb (83.5 kg)  09/18/22 200 lb 4 oz (90.8 kg)    There is no height or weight on file to calculate BMI.  Performance status (ECOG): 3 - Symptomatic, >50% confined to bed  PHYSICAL EXAM:  NA  LABORATORY DATA:  I have reviewed the data as listed    Component Value Date/Time   NA 137 02/26/2023 1710   NA 140 01/08/2022 0911   K 4.5 02/26/2023 1710   CL 107 02/26/2023 1710   CO2 20 (L) 02/26/2023 1710   GLUCOSE 119 (H)  02/26/2023 1710   BUN 41 (H) 02/26/2023 1710   BUN 33 (H) 01/08/2022 0911   CREATININE 1.31 (H) 02/26/2023 1710   CREATININE 1.18 02/10/2023 1518   CREATININE 1.09 10/30/2015 1211   CALCIUM 8.9 02/26/2023 1710   PROT 4.9 (L) 02/26/2023 1710   PROT 6.5 01/08/2022 0911   ALBUMIN 2.2 (L) 02/26/2023 1710   ALBUMIN 4.2 01/08/2022 0911  AST 15 02/26/2023 1710   AST 14 (L) 02/10/2023 1518   ALT 10 02/26/2023 1710   ALT 10 02/10/2023 1518   ALKPHOS 70 02/26/2023 1710   BILITOT 0.5 02/26/2023 1710   BILITOT 0.5 02/10/2023 1518   GFRNONAA 53 (L) 02/26/2023 1710   GFRNONAA >60 02/10/2023 1518   GFRNONAA 64 12/08/2013 1222   GFRAA 58 (L) 12/01/2019 1051   GFRAA 74 12/08/2013 1222    No results found for: "SPEP", "UPEP"  Lab Results  Component Value Date   WBC 6.5 02/26/2023   NEUTROABS 4.4 02/26/2023   HGB 11.4 (L) 02/26/2023   HCT 35.8 (L) 02/26/2023   MCV 89.3 02/26/2023   PLT 322 02/26/2023      Chemistry      Component Value Date/Time   NA 137 02/26/2023 1710   NA 140 01/08/2022 0911   K 4.5 02/26/2023 1710   CL 107 02/26/2023 1710   CO2 20 (L) 02/26/2023 1710   BUN 41 (H) 02/26/2023 1710   BUN 33 (H) 01/08/2022 0911   CREATININE 1.31 (H) 02/26/2023 1710   CREATININE 1.18 02/10/2023 1518   CREATININE 1.09 10/30/2015 1211      Component Value Date/Time   CALCIUM 8.9 02/26/2023 1710   ALKPHOS 70 02/26/2023 1710   AST 15 02/26/2023 1710   AST 14 (L) 02/10/2023 1518   ALT 10 02/26/2023 1710   ALT 10 02/10/2023 1518   BILITOT 0.5 02/26/2023 1710   BILITOT 0.5 02/10/2023 1518       RADIOGRAPHIC STUDIES: I have personally reviewed the radiological images as listed and agreed with the findings in the report. DG Chest Portable 1 View Result Date: 02/26/2023 CLINICAL DATA:  Altered mental status EXAM: PORTABLE CHEST 1 VIEW COMPARISON:  01/23/2023 FINDINGS: Previous median sternotomy and CABG. Right lung is clear. Mild scarring or atelectasis at the left lung base. No  visible effusion. No abnormal bone finding. IMPRESSION: Previous median sternotomy and CABG. Mild scarring or atelectasis at the left lung base. Electronically Signed   By: Paulina Fusi M.D.   On: 02/26/2023 18:21

## 2023-03-02 NOTE — Assessment & Plan Note (Signed)
 Max Tylenol 3000 mg daily. Trial of 1/2 tab of Percocet as needed twice daily.  Continue dexamethasone.  Currently 2 mg twice daily.  Follow-up next week. Wean Gabapentin 300 mg daily x 3 days then stop.

## 2023-03-02 NOTE — Assessment & Plan Note (Signed)
 marinol 5 mg twice daily

## 2023-03-02 NOTE — Progress Notes (Unsigned)
 Patient Care Team: Amon Aloysius BRAVO, MD as PCP - General (Internal Medicine) Pietro Redell RAMAN, MD as PCP - Cardiology (Cardiology) Pietro Redell RAMAN, MD as Consulting Physician (Cardiology) Kay Kemps, MD as Consulting Physician (Orthopedic Surgery) Bonner Ade, MD as Consulting Physician (Physical Medicine and Rehabilitation) Camillo Golas, MD as Consulting Physician (Ophthalmology) Selma Donnice SAUNDERS, MD as Consulting Physician (Urology) Vertell Pont, RN as Oncology Nurse Navigator Pickenpack-Cousar, Fannie SAILOR, NP as Nurse Practitioner [facility] and Palliative Medicine)  Clinic Day:  03/02/2023  Referring physician: No ref. provider found  ASSESSMENT & PLAN:   Assessment & Plan: William Faulkner is a 89 y.o.male with history of CAD, CVA, hypertension, hyperlipidemia, BPH being follow up for prostate cancer.  Patient requested telephone visit today.  Patient is at home with wife today.   Current diagnosis: mHSPC with diffuse bone metastases Initial diagnosis: mHSPC iPSA 261 Germline testing: Will refer in the future Somatic testing: Not yet Treatment: Apalutamide  02/01/23.  No problem-specific Assessment & Plan notes found for this encounter.    The patient understands the plans discussed today and is in agreement with them.  He knows to contact our office if he develops concerns prior to his next appointment.  William [name], MD  [facility] - A DEPT OF [facility] [address] [place] [address] Dept: [phone] Dept Fax: [phone]   No orders of the defined types were placed in this encounter.     CHIEF COMPLAINT:  CC: ***  Current Treatment:  ***  INTERVAL HISTORY:  William Faulkner is here today for repeat clinical assessment. He denies fevers or chills. He denies pain. His appetite is good. His weight {Weight change:10426}.  I have reviewed the past medical history, past surgical history, social history and  family history with the patient and they are unchanged from previous note.  ALLERGIES:  is allergic to atorvastatin, celecoxib, sulfonamide derivatives, and zolpidem tartrate.  MEDICATIONS:  Current Outpatient Medications  Medication Sig Dispense Refill   apalutamide  (ERLEADA ) 60 MG tablet Take 3 tablets (180 mg total) by mouth daily. May discontinue bicalutamide  when starts apalutamide  90 tablet 11   aspirin  81 MG tablet Take 81 mg by mouth daily.     augmented betamethasone  dipropionate (DIPROLENE -AF) 0.05 % cream Apply topically 2 (two) times daily. 60 g 0   cephALEXin  (KEFLEX ) 500 MG capsule Take 1 capsule (500 mg total) by mouth 3 (three) times daily for 5 days. 15 capsule 0   cholecalciferol  (VITAMIN D ) 1000 UNITS tablet Take 1,000 Units by mouth daily.     Cyanocobalamin  ([employer]S VITAMIN B-12) 5000 MCG SUBL Take 5,000 mcg by mouth daily. (Patient not taking: Reported on 09/18/2022)     dexamethasone  (DECADRON ) 2 MG tablet Take 1 tablet (2 mg total) by mouth 2 (two) times daily with a meal. 60 tablet 0   dronabinol  (MARINOL ) 5 MG capsule Take 1 capsule (5 mg total) by mouth 2 (two) times daily before a meal. 60 capsule 0   feeding supplement (ENSURE ENLIVE / ENSURE PLUS) LIQD Take 237 mLs by mouth 2 (two) times daily between meals. 14220 mL 0   gabapentin  (NEURONTIN ) 300 MG capsule Take 1 capsule (300 mg total) by mouth 2 (two) times daily.     insulin  aspart (NOVOLOG ) 100 UNIT/ML injection Inject 0-9 Units into the skin 3 (three) times daily with meals. Sliding scale CBG 70 - 120: 0 units CBG 121 - 150: 1 unit,  CBG 151 - 200: 2 units,  CBG 201 - 250: 3 units,  CBG 251 - 300: 5 units,  CBG 301 - 350: 7 units,  CBG 351 - 400: 9 units   CBG > 400: 9 units and notify your MD     ketoconazole  (NIZORAL ) 2 % cream Apply 1 application topically daily. 30 g 0   nystatin  (MYCOSTATIN /NYSTOP ) powder Apply topically 3 (three) times daily. Apply to affected area.     pantoprazole  (PROTONIX ) 40 MG tablet  Take 1 tablet (40 mg total) by mouth daily at 6 (six) AM.     polyethylene glycol powder (MIRALAX ) 17 GM/SCOOP powder Take 17 g by mouth daily. For constipation.     propranolol  (INDERAL ) 40 MG tablet TAKE 1 TABLET BY MOUTH DAILY (Patient taking differently: Take 40 mg by mouth daily.) 90 tablet 0   rosuvastatin  (CRESTOR ) 20 MG tablet TAKE 1 TABLET BY MOUTH DAILY 30 tablet 0   senna-docusate (SENOKOT-S) 8.6-50 MG tablet Take 1 tablet by mouth 2 (two) times daily between meals as needed for mild constipation.     timolol  (TIMOPTIC ) 0.5 % ophthalmic solution Place 1 drop into both eyes every morning. As directed     No current facility-administered medications for this visit.    HISTORY OF PRESENT ILLNESS:   Oncology History  Prostate cancer (HCC)  01/18/2023 Imaging   CTA CAP 1. Negative for Aortic Dissection. Stable mild fusiform aneurysmal enlargement of the ascending aorta. Severe Aortic Atherosclerosis (ICD10-I70.0).   2. Positive for a subacute and pathologic appearing T12 compression fracture, with underlying lytic lesion and loss of height progressed since November. No retropulsion or other complicating features. In conjunction with November CT findings of skeletal metastatic disease, abnormal bladder base and prostate as detailed on that exam.   3. No other acute finding in the Chest, Abdomen, or Pelvis. Cholelithiasis.   01/18/2023 Imaging   MRI T spine 1. Multiple metastatic deposits throughout the thoracic spine as described. 2. No pathologic fractures. 3. Central disc protrusion at T8-9 effaces the ventral CSF. 4. Rightward disc protrusion and annular tear at T11-12 without focal stenosis   01/20/2023 Imaging   Bone survey IMPRESSION: 1. Patient with known spinal osseous bone lesions. This was better assessed on recent CT and MRI. The majority of these lesions are not well-defined by radiograph. 2. The known T12 compression fracture is not well assessed on  the current exam due to osseous overlap. 3. The known left iliac sclerotic lesion is not well seen by radiograph. 4. Area of sclerosis projects over the lateral aspect of the right distal tibia at the ankle joint, indeterminate.   01/20/2023 Tumor Marker   PSA 261   01/21/2023 Initial Diagnosis   Prostate cancer (HCC)   01/21/2023 Pathology Results   BONE, T12, BIOPSY:  - Metastatic carcinoma to bone, consistent with a primary prostatic carcinoma   Started bicalutamide  while inpatient   01/27/2023 Cancer Staging   Staging form: Prostate, AJCC 8th Edition - Clinical stage from 01/27/2023: Stage IVB (cTX, cNX, pM1b, PSA: 261) - Signed by Tina William BROCKS, MD on 01/27/2023 Stage prefix: Initial diagnosis Prostate specific antigen (PSA) range: 20 or greater   02/01/2023 -  Chemotherapy   Start apalutamide    02/10/2023 Tumor Marker   PSA 27.8       REVIEW OF SYSTEMS:   All relevant systems were reviewed with the patient and are negative.   VITALS:  There were no vitals taken for this visit.  Wt  Readings from Last 3 Encounters:  02/26/23 175 lb (79.4 kg)  01/18/23 184 lb (83.5 kg)  09/18/22 200 lb 4 oz (90.8 kg)    There is no height or weight on file to calculate BMI.  Performance status (ECOG): {CHL ONC [id number]  PHYSICAL EXAM:   GENERAL:alert, no distress and comfortable SKIN: skin color normal, no rashes  EYES: normal, sclera clear OROPHARYNX: no exudate, no erythema    NECK: supple,  non-tender, without nodularity LYMPH:  no palpable cervical lymphadenopathy LUNGS: clear to auscultation with normal breathing effort.  No wheeze or rales HEART: regular rate & rhythm and no murmurs and no lower extremity edema ABDOMEN: abdomen soft, non-tender and nondistended Musculoskeletal: no edema NEURO: alert, fluent speech, no focal motor/sensory deficits.  Strength and sensation equal bilaterally.  LABORATORY DATA:  I have reviewed the data as listed    Component  Value Date/Time   NA 137 02/26/2023 1710   NA 140 01/08/2022 0911   K 4.5 02/26/2023 1710   CL 107 02/26/2023 1710   CO2 20 (L) 02/26/2023 1710   GLUCOSE 119 (H) 02/26/2023 1710   BUN 41 (H) 02/26/2023 1710   BUN 33 (H) 01/08/2022 0911   CREATININE 1.31 (H) 02/26/2023 1710   CREATININE 1.18 02/10/2023 1518   CREATININE 1.09 10/30/2015 1211   CALCIUM  8.9 02/26/2023 1710   PROT 4.9 (L) 02/26/2023 1710   PROT 6.5 01/08/2022 0911   ALBUMIN 2.2 (L) 02/26/2023 1710   ALBUMIN 4.2 01/08/2022 0911   AST 15 02/26/2023 1710   AST 14 (L) 02/10/2023 1518   ALT 10 02/26/2023 1710   ALT 10 02/10/2023 1518   ALKPHOS 70 02/26/2023 1710   BILITOT 0.5 02/26/2023 1710   BILITOT 0.5 02/10/2023 1518   GFRNONAA 53 (L) 02/26/2023 1710   GFRNONAA >60 02/10/2023 1518   GFRNONAA 64 12/08/2013 1222   GFRAA 58 (L) 12/01/2019 1051   GFRAA 74 12/08/2013 1222    No results found for: SPEP, UPEP  Lab Results  Component Value Date   WBC 6.5 02/26/2023   NEUTROABS 4.4 02/26/2023   HGB 11.4 (L) 02/26/2023   HCT 35.8 (L) 02/26/2023   MCV 89.3 02/26/2023   PLT 322 02/26/2023      Chemistry      Component Value Date/Time   NA 137 02/26/2023 1710   NA 140 01/08/2022 0911   K 4.5 02/26/2023 1710   CL 107 02/26/2023 1710   CO2 20 (L) 02/26/2023 1710   BUN 41 (H) 02/26/2023 1710   BUN 33 (H) 01/08/2022 0911   CREATININE 1.31 (H) 02/26/2023 1710   CREATININE 1.18 02/10/2023 1518   CREATININE 1.09 10/30/2015 1211      Component Value Date/Time   CALCIUM  8.9 02/26/2023 1710   ALKPHOS 70 02/26/2023 1710   AST 15 02/26/2023 1710   AST 14 (L) 02/10/2023 1518   ALT 10 02/26/2023 1710   ALT 10 02/10/2023 1518   BILITOT 0.5 02/26/2023 1710   BILITOT 0.5 02/10/2023 1518       RADIOGRAPHIC STUDIES: I have personally reviewed the radiological images as listed and agreed with the findings in the report. DG Chest Portable 1 View Result Date: 02/26/2023 CLINICAL DATA:  Altered mental status EXAM:  PORTABLE CHEST 1 VIEW COMPARISON:  01/23/2023 FINDINGS: Previous median sternotomy and CABG. Right lung is clear. Mild scarring or atelectasis at the left lung base. No visible effusion. No abnormal bone finding. IMPRESSION: Previous median sternotomy and CABG. Mild scarring or atelectasis at  the left lung base. Electronically Signed   By: Oneil Officer M.D.   On: 02/26/2023 18:21

## 2023-03-02 NOTE — Assessment & Plan Note (Signed)
 Will refer for kypho

## 2023-03-02 NOTE — Assessment & Plan Note (Signed)
 calcium  (1000-1200 mg daily from food and supplements) and vitamin D3 (1000 IU daily) Zometa after dental clearance.  He has not seen dentist for 3 years.  Will be looking for a dentist before starting 4 mg every 3 months. Control and prevent diabetes Aggressive cardiovascular risk management Once pain improved can start exercises  Limit alcohol consumption and avoid smoking

## 2023-03-02 NOTE — Assessment & Plan Note (Addendum)
Report improved.   May continue as needed Tylenol.  Max Tylenol 3000 mg daily. Continue dexamethasone.  Currently 2 mg twice daily.  Follow-up next week.

## 2023-03-03 ENCOUNTER — Inpatient Hospital Stay: Payer: Medicare Other | Admitting: Nurse Practitioner

## 2023-03-03 ENCOUNTER — Telehealth: Payer: Self-pay

## 2023-03-03 ENCOUNTER — Inpatient Hospital Stay: Payer: Medicare Other

## 2023-03-03 ENCOUNTER — Inpatient Hospital Stay (HOSPITAL_BASED_OUTPATIENT_CLINIC_OR_DEPARTMENT_OTHER): Payer: Medicare Other

## 2023-03-03 DIAGNOSIS — G893 Neoplasm related pain (acute) (chronic): Secondary | ICD-10-CM | POA: Diagnosis not present

## 2023-03-03 DIAGNOSIS — R630 Anorexia: Secondary | ICD-10-CM

## 2023-03-03 DIAGNOSIS — Z9189 Other specified personal risk factors, not elsewhere classified: Secondary | ICD-10-CM

## 2023-03-03 DIAGNOSIS — E860 Dehydration: Secondary | ICD-10-CM

## 2023-03-03 DIAGNOSIS — C61 Malignant neoplasm of prostate: Secondary | ICD-10-CM

## 2023-03-04 ENCOUNTER — Other Ambulatory Visit: Payer: Self-pay

## 2023-03-04 DIAGNOSIS — D649 Anemia, unspecified: Secondary | ICD-10-CM

## 2023-03-04 DIAGNOSIS — C61 Malignant neoplasm of prostate: Secondary | ICD-10-CM

## 2023-03-04 NOTE — Addendum Note (Signed)
Addended by: Geanie Berlin on: 03/04/2023 08:55 AM   Modules accepted: Orders

## 2023-03-04 NOTE — Progress Notes (Addendum)
Labs ordered for 2/12.  IVF 500 mL/hour x 2 hours as needed signed.

## 2023-03-05 ENCOUNTER — Inpatient Hospital Stay: Payer: Medicare Other

## 2023-03-05 ENCOUNTER — Encounter: Payer: Self-pay | Admitting: Nurse Practitioner

## 2023-03-05 ENCOUNTER — Inpatient Hospital Stay (HOSPITAL_BASED_OUTPATIENT_CLINIC_OR_DEPARTMENT_OTHER): Payer: Medicare Other | Admitting: Nurse Practitioner

## 2023-03-05 VITALS — BP 147/65 | HR 72 | Temp 97.3°F | Resp 20

## 2023-03-05 DIAGNOSIS — R634 Abnormal weight loss: Secondary | ICD-10-CM | POA: Diagnosis not present

## 2023-03-05 DIAGNOSIS — C61 Malignant neoplasm of prostate: Secondary | ICD-10-CM | POA: Diagnosis not present

## 2023-03-05 DIAGNOSIS — R530 Neoplastic (malignant) related fatigue: Secondary | ICD-10-CM

## 2023-03-05 DIAGNOSIS — Z515 Encounter for palliative care: Secondary | ICD-10-CM

## 2023-03-05 DIAGNOSIS — R630 Anorexia: Secondary | ICD-10-CM

## 2023-03-05 DIAGNOSIS — G893 Neoplasm related pain (acute) (chronic): Secondary | ICD-10-CM | POA: Diagnosis not present

## 2023-03-05 DIAGNOSIS — Z7189 Other specified counseling: Secondary | ICD-10-CM

## 2023-03-05 DIAGNOSIS — D649 Anemia, unspecified: Secondary | ICD-10-CM

## 2023-03-05 LAB — CMP (CANCER CENTER ONLY)
ALT: 9 U/L (ref 0–44)
AST: 14 U/L — ABNORMAL LOW (ref 15–41)
Albumin: 3.5 g/dL (ref 3.5–5.0)
Alkaline Phosphatase: 103 U/L (ref 38–126)
Anion gap: 10 (ref 5–15)
BUN: 22 mg/dL (ref 8–23)
CO2: 23 mmol/L (ref 22–32)
Calcium: 9.7 mg/dL (ref 8.9–10.3)
Chloride: 101 mmol/L (ref 98–111)
Creatinine: 1.19 mg/dL (ref 0.61–1.24)
GFR, Estimated: 59 mL/min — ABNORMAL LOW (ref >60)
Glucose, Bld: 169 mg/dL — ABNORMAL HIGH (ref 70–99)
Potassium: 4 mmol/L (ref 3.5–5.1)
Sodium: 134 mmol/L — ABNORMAL LOW (ref 135–145)
Total Bilirubin: 0.4 mg/dL (ref 0.0–1.2)
Total Protein: 6.7 g/dL (ref 6.5–8.1)

## 2023-03-05 LAB — CBC WITH DIFFERENTIAL (CANCER CENTER ONLY)
Abs Immature Granulocytes: 0.04 10*3/uL (ref 0.00–0.07)
Basophils Absolute: 0 10*3/uL (ref 0.0–0.1)
Basophils Relative: 0 %
Eosinophils Absolute: 0 10*3/uL (ref 0.0–0.5)
Eosinophils Relative: 0 %
HCT: 43.5 % (ref 39.0–52.0)
Hemoglobin: 14.2 g/dL (ref 13.0–17.0)
Immature Granulocytes: 1 %
Lymphocytes Relative: 15 %
Lymphs Abs: 1.2 10*3/uL (ref 0.7–4.0)
MCH: 28.3 pg (ref 26.0–34.0)
MCHC: 32.6 g/dL (ref 30.0–36.0)
MCV: 86.8 fL (ref 80.0–100.0)
Monocytes Absolute: 0.8 10*3/uL (ref 0.1–1.0)
Monocytes Relative: 9 %
Neutro Abs: 5.9 10*3/uL (ref 1.7–7.7)
Neutrophils Relative %: 75 %
Platelet Count: 316 10*3/uL (ref 150–400)
RBC: 5.01 MIL/uL (ref 4.22–5.81)
RDW: 14.1 % (ref 11.5–15.5)
WBC Count: 8 10*3/uL (ref 4.0–10.5)
nRBC: 0 % (ref 0.0–0.2)

## 2023-03-05 LAB — FOLATE: Folate: 13.8 ng/mL (ref >5.9)

## 2023-03-05 LAB — VITAMIN B12: Vitamin B-12: 3779 pg/mL — ABNORMAL HIGH (ref 180–914)

## 2023-03-05 MED ORDER — LEUPROLIDE ACETATE (3 MONTH) 22.5 MG IM KIT
22.5000 mg | PACK | Freq: Once | INTRAMUSCULAR | Status: AC
  Administered 2023-03-05: 22.5 mg via INTRAMUSCULAR
  Filled 2023-03-05: qty 22.5

## 2023-03-05 MED ORDER — SODIUM CHLORIDE 0.9 % IV SOLN: Freq: Once | INTRAVENOUS | Status: AC

## 2023-03-05 NOTE — Progress Notes (Signed)
Palliative Medicine [facility]  Telephone:([phone]) [phone] [phone]   Name: William Faulkner Date: 03/05/2023 MRN: [id number]  DOB: Jun 28, 1934  Patient Care Team: Wanda Plump, MD as PCP - General (Internal Medicine) [name] Madolyn Frieze, MD as PCP - Cardiology (Cardiology) [name] Madolyn Frieze, MD as Consulting Physician (Cardiology) Beverely Low, MD as Consulting Physician (Orthopedic Surgery) Sheran Luz, MD as Consulting Physician (Physical Medicine and Rehabilitation) Nelson Chimes, MD as Consulting Physician (Ophthalmology) Jannifer Hick, MD as Consulting Physician (Urology) Cherlyn Cushing, RN as Oncology Nurse Navigator Pickenpack-Cousar, Arty Baumgartner, NP as Nurse Practitioner [facility] and Palliative Medicine)    INTERVAL HISTORY: William Faulkner is a 89 y.o. male with oncologic medical history including prostate cancer (12/2022) with metastatic disease to the bone. Palliative ask to see for symptom management and goals of care.   SOCIAL HISTORY:     reports that he quit smoking about 39 years ago. His smoking use included cigarettes. He started smoking about 54 years ago. He has a 4.5 pack-year smoking history. He has never used smokeless tobacco. He reports that he does not drink alcohol and does not use drugs.  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  MEDICATIONS:  Current Outpatient Medications   Medication Sig Dispense Refill   apalutamide (ERLEADA) 60 MG tablet Take 3 tablets (180 mg total) by mouth daily. May discontinue bicalutamide when starts apalutamide 90 tablet 11   aspirin 81 MG tablet Take 81 mg by mouth daily.     augmented betamethasone dipropionate (DIPROLENE-AF) 0.05 % cream Apply topically 2 (two) times daily. 60 g 0   cholecalciferol (VITAMIN D) 1000 UNITS tablet Take 1,000 Units by mouth daily.     Cyanocobalamin ([employer] VITAMIN B-12) 5000 MCG SUBL Take 5,000 mcg by mouth daily. (Patient not taking: Reported on 09/18/2022)     dexamethasone (DECADRON) 2 MG tablet Take 1 tablet (2 mg total) by mouth 2 (two) times daily with a meal. 60 tablet 0   dronabinol (MARINOL) 5 MG capsule Take 1 capsule (5 mg total) by mouth 2 (two) times daily before a meal. 60 capsule 0   feeding supplement (ENSURE ENLIVE / ENSURE PLUS) LIQD Take 237 mLs by mouth 2 (two) times daily between meals. 14220 mL 0   gabapentin (NEURONTIN) 300 MG capsule Take 1 capsule (300 mg total) by mouth 2 (two) times daily.     insulin aspart (NOVOLOG) 100 UNIT/ML injection Inject 0-9 Units into the skin 3 (three) times daily with meals. Sliding scale CBG 70 - 120: 0 units CBG 121 - 150: 1 unit,  CBG 151 - 200: 2 units,  CBG 201 - 250: 3 units,  CBG 251 - 300: 5 units,  CBG 301 - 350: 7 units,  CBG 351 - 400: 9 units   CBG > 400: 9 units and notify your  MD     ketoconazole (NIZORAL) 2 % cream Apply 1 application topically daily. 30 g 0   nystatin (MYCOSTATIN/NYSTOP) powder Apply topically 3 (three) times daily. Apply to affected area.     pantoprazole (PROTONIX) 40 MG tablet Take 1 tablet (40 mg total) by mouth daily at 6 (six) AM.     polyethylene glycol powder (MIRALAX) 17 GM/SCOOP powder Take 17 g by mouth daily. For constipation.     propranolol (INDERAL) 40 MG tablet TAKE 1 TABLET BY MOUTH DAILY (Patient taking differently: Take 40 mg by mouth daily.) 90 tablet 0   rosuvastatin (CRESTOR) 20 MG tablet TAKE 1 TABLET  BY MOUTH DAILY 30 tablet 0   senna-docusate (SENOKOT-S) 8.6-50 MG tablet Take 1 tablet by mouth 2 (two) times daily between meals as needed for mild constipation.     timolol (TIMOPTIC) 0.5 % ophthalmic solution Place 1 drop into both eyes every morning. As directed     No current facility-administered medications for this visit.   Facility-Administered Medications Ordered in Other Visits  Medication Dose Route Frequency Provider Last Rate Last Admin   0.9 %  sodium chloride infusion   Intravenous Once Melven Sartorius, MD        VITAL SIGNS: There were no vitals taken for this visit. There were no vitals filed for this visit.  Estimated body mass index is 22.47 kg/m as calculated from the following:   Height as of 02/26/23: 6\' 2"  (1.88 m).   Weight as of 02/26/23: 175 lb (79.4 kg).   PERFORMANCE STATUS (ECOG) : 1 - Symptomatic but completely ambulatory   Physical Exam General: NAD, thin, weak appearing  Cardiovascular: regular rate and rhythm Pulmonary: clear ant fields Abdomen: soft, nontender, + bowel sounds Extremities: no edema, no joint deformities Skin: no rashes Neurological: AAO x3  IMPRESSION:  [name] is an 89 year old male who I saw for follow-up in [facility] clinic while receiving supportive care. He complains of persistent poor appetite and a sore to his sacral area due to prolonged sitting and weight loss. Denies nausea, vomiting, constipation, or diarrhea. Reports significant fatigue. He reports ability to rest well at night. During the day, he spends most of his time sitting in a chair, watching TV, and napping intermittently.  He has a poor appetite, expressing a lack of desire to eat or drink, stating 'I don't have any desire to eat.' He occasionally consumes protein shakes but struggles to maintain adequate nutrition. Current weight is 175lbs. We discussed trying to focus on small frequent meals throughout the day versus 3 large meals, snacking as desired  in between. The patient was previously prescribed Marinol however the team is working to [employer]. I discussed with wife we will plan to follow-up on that status.   Mr. Spangler reports he has a pressure ulcer between his cheeks, which becomes more irritable with prolonged bed rest. The sensation is described as burning, and he applies a salve/skin protectant to the area. He uses a cushion when sitting to alleviate discomfort. We discussed relieving pressure and keeping area clean and dry. He knows to contact team if he notices a change specifically increase in size.   The patient reports occasional pain and discomfort in his back, abdomen, and legs. He is currently taking Tylenol and dexamethasone. Previously on gabapentin however this was discontinued per wife. Unfortunately, patient is sensitive to most medications and have been unable to tolerate most pain medications.   All questions  answered and support provided.  I discussed the importance of continued conversation with family and their medical providers regarding overall plan of care and treatment options, ensuring decisions are within the context of the patients values and GOCs.  Assessment and Plan  Decreased appetite  Poor appetite and decreased oral intake. No reported constipation or diarrhea. Patient is taking protein shakes. -Check with pharmacy regarding Marinol prescription and need for authorization -Education provided on focusing on small frequent meals versus large meals.   Pressure Ulcer Reports a sore between buttocks that is causing discomfort. Patient is using a salve and sitting on a cushion. -Continue current management. Notify medical team of any significant changes in color or size.   Pain Management No reported uncontrolled pain. Currently on Tylenol, Dexamethasone  General Health Maintenance Low energy level, spending most of the day in a chair watching TV and napping intermittently. -Continue  current medications and monitor symptoms. -Ongoing goals of care discussions. -I will plan to see patient back in 2-3 weeks. Sooner if needed.  Patient expressed understanding and was in agreement with this plan. He also understands that He can call the clinic at any time with any questions, concerns, or complaints.   Any controlled substances utilized were prescribed in the context of palliative care. PDMP has been reviewed.   Visit consisted of counseling and education dealing with the complex and emotionally intense issues of symptom management and palliative care in the setting of serious and potentially life-threatening illness.  Willette Alma, AGPCNP-BC  Palliative Medicine Team/[facility]

## 2023-03-06 ENCOUNTER — Telehealth: Payer: Self-pay

## 2023-03-06 ENCOUNTER — Other Ambulatory Visit (HOSPITAL_BASED_OUTPATIENT_CLINIC_OR_DEPARTMENT_OTHER): Payer: Self-pay

## 2023-03-06 LAB — PROSTATE-SPECIFIC AG, SERUM (LABCORP): Prostate Specific Ag, Serum: 12.4 ng/mL — ABNORMAL HIGH (ref 0.0–4.0)

## 2023-03-06 NOTE — Telephone Encounter (Signed)
Received fax from [facility]- Pt and wife refusing [facility] services this week. Pt unwilling to get out of bed, they are noting that he is declining.    I see that Pt saw oncology yesterday. Do we need to start hospice/comfort care?

## 2023-03-06 NOTE — Telephone Encounter (Signed)
Palliative care already on board

## 2023-03-10 ENCOUNTER — Telehealth: Payer: Self-pay

## 2023-03-10 DIAGNOSIS — I10 Essential (primary) hypertension: Secondary | ICD-10-CM

## 2023-03-10 MED ORDER — PROPRANOLOL HCL 40 MG PO TABS: 20.0000 mg | ORAL_TABLET | Freq: Every day | ORAL | Status: DC

## 2023-03-10 MED ORDER — ONDANSETRON HCL 8 MG PO TABS: 8.0000 mg | ORAL_TABLET | Freq: Three times a day (TID) | ORAL | 3 refills | Status: DC | PRN

## 2023-03-10 MED ORDER — HYDRALAZINE HCL 50 MG PO TABS: 25.0000 mg | ORAL_TABLET | Freq: Two times a day (BID) | ORAL | Status: DC

## 2023-03-10 NOTE — Telephone Encounter (Signed)
I called and spoke with the patient's wife Mrs. Flora.  Overall the patient is weak, not eating, not drinking.They called EMS on 03/07/23 b/c  of similar symptoms, BP was 100/40, they recommended ER, patient declined it.  He was told that he was on atrial fibrillation.  Today, they have a nurse from  [facility] visiting him and his blood pressure is 91/66 and heart rate of 70. No fever or chills, no blood in the urine, no mental status changes.  He is just weak.  I recommend ER evaluation.  Mrs. Flora declined, she is not sure if the patient will go thus we agreed on the following: They report he is taking hydralazine 25 mg twice daily: Decreased to half tablet daily. Inderal 40 mg daily, decrease to half tablet daily. Avoid abrupt discontinuation. I asked her to call me tomorrow with BP readings   Time spent 25 minutes

## 2023-03-10 NOTE — Telephone Encounter (Signed)
Call patient regarding Zofran request.  Spoke to wife, son as well.  Reported to have obtained Zofran prescription.  Had not tried it.  Report of new nausea and vomiting over the past few days with food.  Discussed can start Zofran 30 to 60 minutes before eating.  Discussed PSA has been lower than before. This is an indication of interval response from prostate cancer treatment.  Report that he has not been eating and drinking as much.  Report of loss of appetite.  Wife report blood pressure is less than 90 systolic.  He does not want to go to the emergency room per previous conversation with PCP.  He is still taking antihypertensive.  Recommend holding it for now due to hypotension.  Trial of Zofran, and increase fluid afterwards as able.  He has been taking protein shake Ensure about once a day.  Recommend increase to at least 2 a day or more.  Increase oral intake as able.  Discussed we will hold apalutamide for a week and see if he has improvement.  Will check on prescription of Marinol for authorization.  Please schedule follow-up in a week.  No labs needed.

## 2023-03-10 NOTE — Telephone Encounter (Signed)
Copied from CRM [id number]. Topic: General - Other >> Mar 10, 2023 10:37 AM Irine Seal wrote: Reason for CRM: The patient was unable to provide a urine sample as requested, so they went to the emergency department 02/26/23 where they were catheterized. The patient tested positive for a urinary tract infection (UTI). EMS reported a few days later that there is still a slight infection present, and the patient would like to speak with the  provider for further assistance. The patient's home health aide with [facility] advised to request a home health referral to have a catheter placed at home to collect a urine sample and ensure that the infection does not escalate.  Patients wife also stated that while in the ED he was given Zofran, and she is wanting to know if that can be called in for the patient, he is having difficulty eating due to being nauseated  Patients wife is wanting to speak with  Conrad , CMA  [phone]

## 2023-03-10 NOTE — Telephone Encounter (Signed)
RN called to check in on pt, spoke with pt wife who reported pt has decreased appetite d/t n/v. Per Lowella Bandy, NP orders placed for Zofran, education provided on medication and non-pharm measures for increasing appetite as well as measures for relieving some pain from his pressure injury. Pt wife voiced understanding. Appointment also made for palliative f/u no further needs at this time.

## 2023-03-11 ENCOUNTER — Ambulatory Visit
Admission: RE | Admit: 2023-03-11 | Discharge: 2023-03-11 | Disposition: A | Discharge status: Home / Self Care | Payer: Medicare Other | Source: Ambulatory Visit | Attending: Internal Medicine | Admitting: Internal Medicine

## 2023-03-11 NOTE — Telephone Encounter (Signed)
Spoke w/ William Faulkner- she has only checked BP once today about 2-3 hours ago (he had an oncology visit earlier today). 2-3 hours ago BP was 91/45 HR 76. Pt is currently napping.

## 2023-03-11 NOTE — Telephone Encounter (Signed)
Advise Mrs. William Faulkner: Stop hydralazine.  Delet from med list Continue Inderal Call me in 2 days with BP readings

## 2023-03-12 ENCOUNTER — Telehealth: Payer: Self-pay

## 2023-03-12 NOTE — Progress Notes (Signed)
  Radiation Oncology         [phone] ________________________________  Name: William Faulkner MRN: [id number]  Date of Service: 03/11/2023  DOB: Dec 23, 1934  Post Treatment Telephone Note  Diagnosis:  C79.51 Secondary malignant neoplasm of bone (as documented in provider EOT note)  The patient was not available for call today. Voicemail left.  The patient is scheduled for ongoing care with Dr. Cherly Hensen in medical oncology. The patient was encouraged to call if he develops concerns or questions regarding radiation.    [name], LPN

## 2023-03-12 NOTE — Telephone Encounter (Signed)
LMOM informing Pt's wife Charlie Pitter of PCP recommendations. Hydralazine d/c from med list. Instructed for her to call if questions/concerns.

## 2023-03-12 NOTE — Addendum Note (Signed)
Addended byConrad  D on: 03/12/2023 07:20 AM   Modules accepted: Orders

## 2023-03-14 ENCOUNTER — Telehealth: Payer: Self-pay | Admitting: Internal Medicine

## 2023-03-14 ENCOUNTER — Telehealth: Payer: Self-pay

## 2023-03-14 NOTE — Telephone Encounter (Addendum)
TC from pt's wife, wanting to know the status of the authorization for the Marinol Rx that Dr. Cherly Hensen ordered for pt. She states Lowella Bandy, NP was supposed to be calling her back to update her about this, but she has not heard from her. Contacted Willette Alma, NP, who states that pt's insurance denied this medication and that she started an appeal. She reports talking to someone on the phone 2/19 and said she was informed that it may take 5-7 days before insurance makes the final decision. Wife notified.

## 2023-03-14 NOTE — Telephone Encounter (Signed)
Called to check on patient. LMOM for their wife Ms. Flora. Advised to stop Inderal if BP is still low.

## 2023-03-14 NOTE — Addendum Note (Signed)
Encounter addended by: Marcello Fennel, PA-C on: 03/14/2023 11:05 AM  Actions taken: Clinical Note Signed, Visit diagnoses modified

## 2023-03-17 ENCOUNTER — Ambulatory Visit: Payer: Self-pay | Admitting: Internal Medicine

## 2023-03-17 ENCOUNTER — Other Ambulatory Visit (HOSPITAL_BASED_OUTPATIENT_CLINIC_OR_DEPARTMENT_OTHER): Payer: Self-pay

## 2023-03-17 ENCOUNTER — Inpatient Hospital Stay (HOSPITAL_BASED_OUTPATIENT_CLINIC_OR_DEPARTMENT_OTHER): Payer: Medicare Other

## 2023-03-17 ENCOUNTER — Telehealth: Payer: Self-pay | Admitting: *Deleted

## 2023-03-17 DIAGNOSIS — C61 Malignant neoplasm of prostate: Secondary | ICD-10-CM | POA: Diagnosis not present

## 2023-03-17 NOTE — Telephone Encounter (Signed)
 Copied from CRM [id number]. Topic: Clinical - Red Word Triage >> Mar 17, 2023 11:21 AM Truddie Crumble wrote: Red Word that prompted transfer to Nurse Triage: lower back is really red and his skin Is breaking and the home health nurse told the patient wife to put a patch and burn gel over it   Chief Complaint: Sores  Symptoms: Sores on buttocks with redness Frequency: Constant  Pertinent Negatives: Patient denies fever Disposition: [] ED /[] Urgent Care (no appt availability in office) / [x] Appointment(In office/virtual)/ []  [facility]/ [] Home Care/ [x] Refused Recommended Disposition /[] [facility]/ []  Follow-up with PCP Additional Notes: Patient's wife reports that the patient was recently in the hospital and while there had a lot of redness to his buttocks and thighs. She states that the redness is improving but that there are sores on his buttocks, one sore on each side, that she states are 3-4 inches long. She states that she has been following the home health nurses instructions but reports that the sores seem to be getting worse. Patient is seeing his oncologist today and she will discuss the sores with them.   She would like a call from Dr. Drue Novel or someone on his team about a referral to wound care for his sores.    Reason for Disposition  Large sore (> 1 inch or 2.5 cm across)  Answer Assessment - Initial Assessment Questions 1. APPEARANCE of SORES: "What do the sores look like?"     Redness surrounding  2. NUMBER: "How many sores are there?"     2 sores, one on each buttock 3. SIZE: "How big is the largest sore?"     3-4 inches long  4. LOCATION: "Where are the sores located?"     Buttocks 5. ONSET: "When did the sores begin?"     Over 1 month  6. TENDER: "Does it hurt when you touch it?"  (Scale 1-10; or mild, moderate, severe)      Yes 7. CAUSE: "What do you think is causing the sores?"     Recent hospital stay  8. OTHER SYMPTOMS: "Do you have any other  symptoms?" (e.g., fever, new weakness)     No  Protocols used: Sores-A-AH

## 2023-03-17 NOTE — Assessment & Plan Note (Signed)
 Resume apalutamide at 120 mg daily for 2 weeks and reassess Hold on start ADT for now due to his weakness. Consider in near future if performance status improves. CBC, CMP, PSA and testosterone when return tomorrow.

## 2023-03-17 NOTE — Progress Notes (Signed)
 Patient Care Team: Wanda Plump, MD as PCP - General (Internal Medicine) [name] Madolyn Frieze, MD as PCP - Cardiology (Cardiology) [name] Madolyn Frieze, MD as Consulting Physician (Cardiology) Beverely Low, MD as Consulting Physician (Orthopedic Surgery) Sheran Luz, MD as Consulting Physician (Physical Medicine and Rehabilitation) Nelson Chimes, MD as Consulting Physician (Ophthalmology) Jannifer Hick, MD as Consulting Physician (Urology) Cherlyn Cushing, RN as Oncology Nurse Navigator Pickenpack-Cousar, Arty Baumgartner, NP as Nurse Practitioner [facility] and Palliative Medicine)  Clinic Day:  03/17/2023  Referring physician: Wanda Plump, MD  ASSESSMENT & PLAN:   Assessment & Plan: William Faulkner is a 89 y.o.male with history of CAD, CVA, hypertension, hyperlipidemia, BPH supposed to follow up in the clinic today for prostate cancer.  Wife called about noon before patient cannot coming and requested telephone visit today as last-minute due too weak to present to the clinic.     Locations: Patient is at home with wife today.  Physician in [facility] cancer center.   Wife called the last minutes that patient cannot make it due to weakness.   Reports since stopping hypertensive medication, patient has felt better.  He has increased oral intake gradually.  Report of worsening pressure ulcers in the buttocks.  Home health care nurse came in last week and recommended brain gel with lidocaine.  Will reach out to see if there is any home care nurse can help with this.  Will resume apalutamide 140 mg daily and reassess in 2 weeks.  Prostate cancer (HCC) Resume apalutamide at 120 mg daily for 2 weeks and reassess Hold on start ADT for now due to his weakness. Consider in near future if performance status improves. CBC, CMP, PSA and testosterone when return tomorrow.   The patient understands the plans discussed today and is in agreement with them.  He knows to contact our office if he develops concerns prior to his next  appointment.  Total time: 30 minutes  Melven Sartorius, MD  [facility] - A DEPT OF [facility] [address] [place] [address] Dept: [phone] Dept Fax: [phone]   Orders Placed This Encounter  Procedures   CBC with Differential (Cancer Center Only)    Standing Status:   Future    Expiration Date:   03/16/2024   CMP (Cancer Center only)    Standing Status:   Future    Expiration Date:   03/16/2024   Prostate-Specific AG, Serum    Standing Status:   Future    Expiration Date:   03/16/2024   Testosterone    Standing Status:   Future    Expiration Date:   03/16/2024      CHIEF COMPLAINT:  CC: [facility]SP[facility]  Current Treatment:  apalutamide  INTERVAL HISTORY:  [name] is being follow-up through telephone visit today for repeat clinical assessment.  They again called last-minute to change to telephone visit report patient cannot come in.  He feels down and chills. He denies any fever or signs of infection. Report it's cold and wife got the heat up. He has improvement of more oral indication. Yesterday BP was 116/71. Somewhere about 105/60's. Those numbers are without BP medications. He is more alert. Wife reports he really tries to eat. Wife gave him Ensure at least one and sometimes two. He trying to drink. He is getting hot flashes.  He is off apalutamide for a week now.  Report home care nurse ask him to apply  butt paste for wound in the back and has not improved. Last nurse asked him to change to burn gel with lidocaine.   I have reviewed the past medical history, past surgical history, social history and family history with the patient and they are unchanged from previous note.  ALLERGIES:  is allergic to atorvastatin, celecoxib, sulfonamide derivatives, and zolpidem tartrate.  MEDICATIONS:  Current Outpatient Medications  Medication Sig Dispense Refill   apalutamide (ERLEADA) 60 MG tablet Take 3 tablets (180  mg total) by mouth daily. May discontinue bicalutamide when starts apalutamide 90 tablet 11   aspirin 81 MG tablet Take 81 mg by mouth daily.     augmented betamethasone dipropionate (DIPROLENE-AF) 0.05 % cream Apply topically 2 (two) times daily. 60 g 0   cholecalciferol (VITAMIN D) 1000 UNITS tablet Take 1,000 Units by mouth daily.     Cyanocobalamin ([employer]S VITAMIN B-12) 5000 MCG SUBL Take 5,000 mcg by mouth daily.     dexamethasone (DECADRON) 2 MG tablet Take 1 tablet (2 mg total) by mouth 2 (two) times daily with a meal. 60 tablet 0   dronabinol (MARINOL) 5 MG capsule Take 1 capsule (5 mg total) by mouth 2 (two) times daily before a meal. 60 capsule 0   feeding supplement (ENSURE ENLIVE / ENSURE PLUS) LIQD Take 237 mLs by mouth 2 (two) times daily between meals. 14220 mL 0   insulin aspart (NOVOLOG) 100 UNIT/ML injection Inject 0-9 Units into the skin 3 (three) times daily with meals. Sliding scale CBG 70 - 120: 0 units CBG 121 - 150: 1 unit,  CBG 151 - 200: 2 units,  CBG 201 - 250: 3 units,  CBG 251 - 300: 5 units,  CBG 301 - 350: 7 units,  CBG 351 - 400: 9 units   CBG > 400: 9 units and notify your MD     ketoconazole (NIZORAL) 2 % cream Apply 1 application topically daily. 30 g 0   nystatin (MYCOSTATIN/NYSTOP) powder Apply topically 3 (three) times daily. Apply to affected area.     ondansetron (ZOFRAN) 8 MG tablet Take 1 tablet (8 mg total) by mouth every 8 (eight) hours as needed for nausea or vomiting. 30 tablet 3   pantoprazole (PROTONIX) 40 MG tablet Take 1 tablet (40 mg total) by mouth daily at 6 (six) AM.     polyethylene glycol powder (MIRALAX) 17 GM/SCOOP powder Take 17 g by mouth daily. For constipation.     propranolol (INDERAL) 40 MG tablet Take 0.5 tablets (20 mg total) by mouth daily.     rosuvastatin (CRESTOR) 20 MG tablet TAKE 1 TABLET BY MOUTH DAILY 30 tablet 0   senna-docusate (SENOKOT-S) 8.6-50 MG tablet Take 1 tablet by mouth 2 (two) times daily between meals as needed for  mild constipation.     timolol (TIMOPTIC) 0.5 % ophthalmic solution Place 1 drop into both eyes every morning. As directed     No current facility-administered medications for this visit.    HISTORY OF PRESENT ILLNESS:   Oncology History  Prostate cancer (HCC)  01/18/2023 Imaging   CTA CAP 1. Negative for Aortic Dissection. Stable mild fusiform aneurysmal enlargement of the ascending aorta. Severe Aortic Atherosclerosis (ICD10-I70.0).   2. Positive for a subacute and pathologic appearing T12 compression fracture, with underlying lytic lesion and loss of height progressed since November. No retropulsion or other complicating features. In conjunction with November CT findings of skeletal metastatic disease, abnormal bladder base and prostate as detailed on that exam.  3. No other acute finding in the Chest, Abdomen, or Pelvis. Cholelithiasis.   01/18/2023 Imaging   MRI T spine 1. Multiple metastatic deposits throughout the thoracic spine as described. 2. No pathologic fractures. 3. Central disc protrusion at T8-9 effaces the ventral CSF. 4. Rightward disc protrusion and annular tear at T11-12 without focal stenosis   01/20/2023 Imaging   Bone survey IMPRESSION: 1. Patient with known spinal osseous bone lesions. This was better assessed on recent CT and MRI. The majority of these lesions are not well-defined by radiograph. 2. The known T12 compression fracture is not well assessed on the current exam due to osseous overlap. 3. The known left iliac sclerotic lesion is not well seen by radiograph. 4. Area of sclerosis projects over the lateral aspect of the right distal tibia at the ankle joint, indeterminate.   01/20/2023 Tumor Marker   PSA 261   01/21/2023 Initial Diagnosis   Prostate cancer (HCC)   01/21/2023 Pathology Results   BONE, T12, BIOPSY:  - Metastatic carcinoma to bone, consistent with a primary prostatic carcinoma   Started bicalutamide while  inpatient   01/27/2023 Cancer Staging   Staging form: Prostate, AJCC 8th Edition - Clinical stage from 01/27/2023: Stage IVB (cTX, cNX, pM1b, PSA: 261) - Signed by Melven Sartorius, MD on 01/27/2023 Stage prefix: Initial diagnosis Prostate specific antigen (PSA) range: 20 or greater   02/01/2023 -  Chemotherapy   Start apalutamide   02/10/2023 Tumor Marker   PSA 27.8       REVIEW OF SYSTEMS:   All relevant systems were reviewed with the patient and are negative.   VITALS:  There were no vitals taken for this visit.  Wt Readings from Last 3 Encounters:  02/26/23 175 lb (79.4 kg)  01/18/23 184 lb (83.5 kg)  09/18/22 200 lb 4 oz (90.8 kg)    There is no height or weight on file to calculate BMI.  Performance status (ECOG): 3 - Symptomatic, >50% confined to bed  PHYSICAL EXAM:  na  LABORATORY DATA:  I have reviewed the data as listed    Component Value Date/Time   NA 134 (L) 03/05/2023 1500   NA 140 01/08/2022 0911   K 4.0 03/05/2023 1500   CL 101 03/05/2023 1500   CO2 23 03/05/2023 1500   GLUCOSE 169 (H) 03/05/2023 1500   BUN 22 03/05/2023 1500   BUN 33 (H) 01/08/2022 0911   CREATININE 1.19 03/05/2023 1500   CREATININE 1.09 10/30/2015 1211   CALCIUM 9.7 03/05/2023 1500   PROT 6.7 03/05/2023 1500   PROT 6.5 01/08/2022 0911   ALBUMIN 3.5 03/05/2023 1500   ALBUMIN 4.2 01/08/2022 0911   AST 14 (L) 03/05/2023 1500   ALT 9 03/05/2023 1500   ALKPHOS 103 03/05/2023 1500   BILITOT 0.4 03/05/2023 1500   GFRNONAA 59 (L) 03/05/2023 1500   GFRNONAA 64 12/08/2013 1222   GFRAA 58 (L) 12/01/2019 1051   GFRAA 74 12/08/2013 1222    No results found for: "SPEP", "UPEP"  Lab Results  Component Value Date   WBC 8.0 03/05/2023   NEUTROABS 5.9 03/05/2023   HGB 14.2 03/05/2023   HCT 43.5 03/05/2023   MCV 86.8 03/05/2023   PLT 316 03/05/2023      Chemistry      Component Value Date/Time   NA 134 (L) 03/05/2023 1500   NA 140 01/08/2022 0911   K 4.0 03/05/2023 1500   CL  101 03/05/2023 1500   CO2 23 03/05/2023 1500  BUN 22 03/05/2023 1500   BUN 33 (H) 01/08/2022 0911   CREATININE 1.19 03/05/2023 1500   CREATININE 1.09 10/30/2015 1211      Component Value Date/Time   CALCIUM 9.7 03/05/2023 1500   ALKPHOS 103 03/05/2023 1500   AST 14 (L) 03/05/2023 1500   ALT 9 03/05/2023 1500   BILITOT 0.4 03/05/2023 1500       RADIOGRAPHIC STUDIES: I have personally reviewed the radiological images as listed and agreed with the findings in the report. DG Chest Portable 1 View Result Date: 02/26/2023 CLINICAL DATA:  Altered mental status EXAM: PORTABLE CHEST 1 VIEW COMPARISON:  01/23/2023 FINDINGS: Previous median sternotomy and CABG. Right lung is clear. Mild scarring or atelectasis at the left lung base. No visible effusion. No abnormal bone finding. IMPRESSION: Previous median sternotomy and CABG. Mild scarring or atelectasis at the left lung base. Electronically Signed   By: Paulina Fusi M.D.   On: 02/26/2023 18:21

## 2023-03-17 NOTE — Telephone Encounter (Signed)
 William Faulkner states William Faulkner does not feel well today and has refused to come to appt. He will be OK doing a phone visit. Dr Cherly Hensen notified

## 2023-03-17 NOTE — Telephone Encounter (Signed)
 Okay to refer for wound care? Refused appt per notes.

## 2023-03-17 NOTE — Telephone Encounter (Signed)
 Spoke with patient's wife and she was made aware of instructions.   They did see Oncologist today and they are placing referral for wound care. Will call back if they need [place].

## 2023-03-17 NOTE — Telephone Encounter (Signed)
 Please provide the following advice: - Frequent turning - Get a sheepskin to put under the bed sheets - Perhaps they home he will have a wound specialist.  Okay to refer to them. - I doubt he will go for office visit with a wound care center but if they are willing to do, please refer. - If fever, chills, discharge from the area: ER.

## 2023-03-18 ENCOUNTER — Other Ambulatory Visit: Payer: Self-pay

## 2023-03-18 ENCOUNTER — Other Ambulatory Visit (HOSPITAL_BASED_OUTPATIENT_CLINIC_OR_DEPARTMENT_OTHER): Payer: Self-pay

## 2023-03-18 ENCOUNTER — Telehealth: Payer: Self-pay

## 2023-03-18 ENCOUNTER — Encounter: Payer: Self-pay | Admitting: Nurse Practitioner

## 2023-03-18 ENCOUNTER — Inpatient Hospital Stay (HOSPITAL_BASED_OUTPATIENT_CLINIC_OR_DEPARTMENT_OTHER): Payer: Medicare Other | Admitting: Nurse Practitioner

## 2023-03-18 DIAGNOSIS — Z515 Encounter for palliative care: Secondary | ICD-10-CM | POA: Diagnosis not present

## 2023-03-18 DIAGNOSIS — R634 Abnormal weight loss: Secondary | ICD-10-CM

## 2023-03-18 DIAGNOSIS — L89309 Pressure ulcer of unspecified buttock, unspecified stage: Secondary | ICD-10-CM

## 2023-03-18 DIAGNOSIS — Z7189 Other specified counseling: Secondary | ICD-10-CM | POA: Diagnosis not present

## 2023-03-18 DIAGNOSIS — G893 Neoplasm related pain (acute) (chronic): Secondary | ICD-10-CM

## 2023-03-18 DIAGNOSIS — R630 Anorexia: Secondary | ICD-10-CM

## 2023-03-18 DIAGNOSIS — R530 Neoplastic (malignant) related fatigue: Secondary | ICD-10-CM

## 2023-03-18 DIAGNOSIS — C61 Malignant neoplasm of prostate: Secondary | ICD-10-CM

## 2023-03-18 NOTE — Telephone Encounter (Signed)
 William Faulkner

## 2023-03-18 NOTE — Progress Notes (Deleted)
 Palliative Medicine [facility]  [phone] [phone]   Name: William Faulkner Date: 03/18/2023 MRN: [id number]  DOB: Mar 13, 1934  Patient Care Team: Wanda Plump, MD as PCP - General (Internal Medicine) [name] Madolyn Frieze, MD as PCP - Cardiology (Cardiology) [name] Madolyn Frieze, MD as Consulting Physician (Cardiology) Beverely Low, MD as Consulting Physician (Orthopedic Surgery) Sheran Luz, MD as Consulting Physician (Physical Medicine and Rehabilitation) Nelson Chimes, MD as Consulting Physician (Ophthalmology) Jannifer Hick, MD as Consulting Physician (Urology) Cherlyn Cushing, RN as Oncology Nurse Navigator Pickenpack-Cousar, Arty Baumgartner, NP as Nurse Practitioner [facility] and Palliative Medicine)    INTERVAL HISTORY: William Faulkner is a 89 y.o. male with oncologic medical history including prostate cancer (12/2022) with metastatic disease to the bone. Palliative ask to see for symptom management and goals of care.   SOCIAL HISTORY:     reports that he quit smoking about 39 years ago. His smoking use included cigarettes. He started smoking about 54 years ago. He has a 4.5 pack-year smoking history. He has never used smokeless tobacco. He reports that he does not drink alcohol and does not use drugs.  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  MEDICATIONS:  Current Outpatient Medications   Medication Sig Dispense Refill   apalutamide (ERLEADA) 60 MG tablet Take 3 tablets (180 mg total) by mouth daily. May discontinue bicalutamide when starts apalutamide 90 tablet 11   aspirin 81 MG tablet Take 81 mg by mouth daily.     augmented betamethasone dipropionate (DIPROLENE-AF) 0.05 % cream Apply topically 2 (two) times daily. 60 g 0   cholecalciferol (VITAMIN D) 1000 UNITS tablet Take 1,000 Units by mouth daily.     Cyanocobalamin (CVS VITAMIN B-12) 5000 MCG SUBL Take 5,000 mcg by mouth daily.     dexamethasone (DECADRON) 2 MG tablet Take 1 tablet (2 mg total) by mouth 2 (two) times daily with a meal. 60 tablet 0   dronabinol (MARINOL) 5 MG capsule Take 1 capsule (5 mg total) by mouth 2 (two) times daily before a meal. 60 capsule 0   feeding supplement (ENSURE ENLIVE / ENSURE PLUS) LIQD Take 237 mLs by mouth 2 (two) times daily between meals. 14220 mL 0   insulin aspart (NOVOLOG) 100 UNIT/ML injection Inject 0-9 Units into the skin 3 (three) times daily with meals. Sliding scale CBG 70 - 120: 0 units CBG 121 - 150: 1 unit,  CBG 151 - 200: 2 units,  CBG 201 - 250: 3 units,  CBG 251 - 300: 5 units,  CBG 301 - 350: 7 units,  CBG 351 - 400: 9 units   CBG > 400: 9 units and notify your MD     ketoconazole (NIZORAL) 2 % cream Apply 1 application topically daily. 30 g 0   nystatin (MYCOSTATIN/NYSTOP) powder Apply topically 3 (three)  times daily. Apply to affected area.     ondansetron (ZOFRAN) 8 MG tablet Take 1 tablet (8 mg total) by mouth every 8 (eight) hours as needed for nausea or vomiting. 30 tablet 3   pantoprazole (PROTONIX) 40 MG tablet Take 1 tablet (40 mg total) by mouth daily at 6 (six) AM.     polyethylene glycol powder (MIRALAX) 17 GM/SCOOP powder Take 17 g by mouth daily. For constipation.     propranolol (INDERAL) 40 MG tablet Take 0.5 tablets (20 mg total) by mouth daily.     rosuvastatin (CRESTOR) 20 MG tablet TAKE 1 TABLET BY MOUTH DAILY 30 tablet 0   senna-docusate (SENOKOT-S)  8.6-50 MG tablet Take 1 tablet by mouth 2 (two) times daily between meals as needed for mild constipation.     timolol (TIMOPTIC) 0.5 % ophthalmic solution Place 1 drop into both eyes every morning. As directed     No current facility-administered medications for this visit.    VITAL SIGNS: There were no vitals taken for this visit. There were no vitals filed for this visit.  Estimated body mass index is 22.47 kg/m as calculated from the following:   Height as of 02/26/23: 6\' 2"  (1.88 m).   Weight as of 02/26/23: 175 lb (79.4 kg).   PERFORMANCE STATUS (ECOG) : 1 - Symptomatic but completely ambulatory   Physical Exam General: NAD, thin, weak appearing  Cardiovascular: regular rate and rhythm Pulmonary: clear ant fields Abdomen: soft, nontender, + bowel sounds Extremities: no edema, no joint deformities Skin: no rashes Neurological: AAO x3  IMPRESSION:  [name] is an 89 year old male who I saw for follow-up in [facility] clinic while receiving supportive care. He complains of persistent poor appetite and a sore to his sacral area due to prolonged sitting and weight loss. Denies nausea, vomiting, constipation, or diarrhea. Reports significant fatigue. He reports ability to rest well at night. During the day, he spends most of his time sitting in a chair, watching TV, and napping intermittently.  He has a poor appetite, expressing a lack of desire to eat or drink, stating 'I don't have any desire to eat.' He occasionally consumes protein shakes but struggles to maintain adequate nutrition. Current weight is 175lbs. We discussed trying to focus on small frequent meals throughout the day versus 3 large meals, snacking as desired in between. The patient was previously prescribed Marinol however the team is working to [employer]. I discussed with wife we will plan to follow-up on that status.   [name] reports he has a pressure ulcer between his cheeks, which becomes  more irritable with prolonged bed rest. The sensation is described as burning, and he applies a salve/skin protectant to the area. He uses a cushion when sitting to alleviate discomfort. We discussed relieving pressure and keeping area clean and dry. He knows to contact team if he notices a change specifically increase in size.   The patient reports occasional pain and discomfort in his back, abdomen, and legs. He is currently taking Tylenol and dexamethasone. Previously on gabapentin however this was discontinued per wife. Unfortunately, patient is sensitive to most medications and have been unable to tolerate most pain medications.   All questions answered and support provided.  I discussed the importance of continued conversation with family and their medical providers regarding overall plan of care and treatment options, ensuring decisions are within the context of the patients values and GOCs.  Assessment and Plan  Decreased  appetite  Poor appetite and decreased oral intake. No reported constipation or diarrhea. Patient is taking protein shakes. -Check with pharmacy regarding Marinol prescription and need for authorization -Education provided on focusing on small frequent meals versus large meals.   Pressure Ulcer Reports a sore between buttocks that is causing discomfort. Patient is using a salve and sitting on a cushion. -Continue current management. Notify medical team of any significant changes in color or size.   Pain Management No reported uncontrolled pain. Currently on Tylenol, Dexamethasone  General Health Maintenance Low energy level, spending most of the day in a chair watching TV and napping intermittently. -Continue current medications and monitor symptoms. -Ongoing goals of care discussions. -I will plan to see patient back in 2-3 weeks. Sooner if needed.  Patient expressed understanding and was in agreement with this plan. He also understands that He can call the  clinic at any time with any questions, concerns, or complaints.   Any controlled substances utilized were prescribed in the context of palliative care. PDMP has been reviewed.   Visit consisted of counseling and education dealing with the complex and emotionally intense issues of symptom management and palliative care in the setting of serious and potentially life-threatening illness.  Willette Alma, AGPCNP-BC  Palliative Medicine Team/[facility]

## 2023-03-18 NOTE — Telephone Encounter (Signed)
 TC from pt's wife, inquiring about wound care referral that she requested yesterday due to wound on pt's buttocks that isn't improving w/ [facility] RN managing it. Discussed w/ Dr. Salem Caster that N. Pickenpack-Cousar, NP will be evaluating the wound tomorrow at pt's palliative care visit, but requests that wound care referral be made today. Pt's wife made aware of referral. She states that pt is weak and his ears are "full," and she does not think she can get him to come to his palliative care visit tomorrow; requesting telephone visit instead. Advised that N. Pickenpack-Cousar, NP will be notified and someone should be in touch regarding this.

## 2023-03-18 NOTE — Progress Notes (Signed)
 Palliative Medicine [facility]  [phone] [phone]) [phone]   Name: William Faulkner Date: 03/18/2023 MRN: [id number]  DOB: 1934/02/22  Patient Care Team: Wanda Plump, MD as PCP - General (Internal Medicine) [name] Madolyn Frieze, MD as PCP - Cardiology (Cardiology) [name] Madolyn Frieze, MD as Consulting Physician (Cardiology) Beverely Low, MD as Consulting Physician (Orthopedic Surgery) Sheran Luz, MD as Consulting Physician (Physical Medicine and Rehabilitation) Nelson Chimes, MD as Consulting Physician (Ophthalmology) Jannifer Hick, MD as Consulting Physician (Urology) Cherlyn Cushing, RN as Oncology Nurse Navigator Pickenpack-Cousar, Arty Baumgartner, NP as Nurse Practitioner [facility] and Palliative Medicine)   I connected with William Faulkner on 03/18/23 at 10:30 AM EST by phone and verified that I am speaking with the correct person using two identifiers.   I discussed the limitations, risks, security and privacy concerns of performing an evaluation and management service by telemedicine and the availability of in-person appointments. I also discussed with the patient that there may be a patient responsible charge related to this service. The patient expressed understanding and agreed to proceed.   Other persons participating in the visit and their role in the encounter: William Faulkner (spouse)   Patient's location: home  Provider's location: [facility]   Chief Complaint: f/u of symptom management   INTERVAL HISTORY: William Faulkner is a 89 y.o. male with oncologic medical history including prostate cancer (12/2022) with metastatic disease to the bone. Palliative ask to see for symptom management and goals of care.   SOCIAL HISTORY:     reports that he quit smoking about 39 years ago. His smoking use included cigarettes. He started smoking about 54 years ago. He has a 4.5 pack-year smoking history. He has never used smokeless tobacco. He reports that he does  not drink alcohol and does not use drugs.  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  MEDICATIONS:  Current Outpatient Medications  Medication Sig Dispense Refill   apalutamide (ERLEADA) 60 MG tablet Take 3 tablets (180 mg total) by mouth daily. May discontinue bicalutamide when starts apalutamide 90 tablet 11   aspirin 81 MG tablet Take 81 mg by mouth daily.     augmented betamethasone dipropionate (DIPROLENE-AF) 0.05 % cream Apply topically 2 (two) times daily. 60 g 0   cholecalciferol (VITAMIN D) 1000 UNITS tablet Take 1,000 Units by mouth daily.     Cyanocobalamin ([employer]S VITAMIN B-12) 5000 MCG SUBL Take 5,000 mcg by mouth daily.     dexamethasone (DECADRON) 2 MG tablet Take 1 tablet (2 mg total) by mouth 2 (two) times daily with a meal. 60 tablet 0   dronabinol (MARINOL) 5 MG capsule Take 1 capsule (5 mg total) by mouth 2 (two) times daily before a meal. 60 capsule 0   feeding supplement (ENSURE ENLIVE / ENSURE PLUS) LIQD Take 237 mLs by mouth 2 (two)  times daily between meals. 14220 mL 0   insulin aspart (NOVOLOG) 100 UNIT/ML injection Inject 0-9 Units into the skin 3 (three) times daily with meals. Sliding scale CBG 70 - 120: 0 units CBG 121 - 150: 1 unit,  CBG 151 - 200: 2 units,  CBG 201 - 250: 3 units,  CBG 251 - 300: 5 units,  CBG 301 - 350: 7 units,  CBG 351 - 400: 9 units   CBG > 400: 9 units and notify your MD     ketoconazole (NIZORAL) 2  % cream Apply 1 application topically daily. 30 g 0   nystatin (MYCOSTATIN/NYSTOP) powder Apply topically 3 (three) times daily. Apply to affected area.     ondansetron (ZOFRAN) 8 MG tablet Take 1 tablet (8 mg total) by mouth every 8 (eight) hours as needed for nausea or vomiting. 30 tablet 3   pantoprazole (PROTONIX) 40 MG tablet Take 1 tablet (40 mg total) by mouth daily at 6 (six) AM.     polyethylene glycol powder (MIRALAX) 17 GM/SCOOP powder Take 17 g by mouth daily. For constipation.     propranolol (INDERAL) 40 MG tablet Take 0.5 tablets (20 mg total) by mouth daily.     rosuvastatin (CRESTOR) 20 MG tablet TAKE 1 TABLET BY MOUTH DAILY 30 tablet 0   senna-docusate (SENOKOT-S) 8.6-50 MG tablet Take 1 tablet by mouth 2 (two) times daily between meals as needed for mild constipation.     timolol (TIMOPTIC) 0.5 % ophthalmic solution Place 1 drop into both eyes every morning. As directed     No current facility-administered medications for this visit.    VITAL SIGNS: There were no vitals taken for this visit. There were no vitals filed for this visit.  Estimated body mass index is 22.47 kg/m as calculated from the following:   Height as of 02/26/23: 6\' 2"  (1.88 m).   Weight as of 02/26/23: 175 lb (79.4 kg).   PERFORMANCE STATUS (ECOG) : 1 - Symptomatic but completely ambulatory   IMPRESSION:  I connected by phone with William Faulkner for follow-up. Patient continues to be concerned with poor appetite, fatigue, and sacral wound. Wife reports his nausea is much better controlled. He is taking antiemetics twice a day. Denies concerns with constipation or diarrhea. Taking Miralax daily.   William Faulkner has developed a sacral ulceration. [facility] home health nurse has been caring for his wound however is now reporting worsening to site and need for further care that cannot be provided in the home. Patient was originally scheduled for an in office visit with myself on tomorrow however  wife reports he is to weak to come in to appointment. She expresses difficulty with transporting at this time however is certain that her son can assist with transportation if patient requires wound care visit at wound center. A referral has been placed by Dr. Cherly Hensen. Wife is aware that she should receive a call to schedule. At this time she has been instructed by the home health team to apply aloe burn gel to area for comfort due to burning and stinging feeling per patient.   Patient has occasional back and abdomen pain outside of his sacral area. This is controlled with Tylenol as patient is sensitive to many medications.  He continues to have a poor appetite expressing minimum desire to eat. This has slightly improved since nausea is better controlled however wife is still concerned. We have appealed decisions for Marinol and insurance has approved with no  co-pay. Wife is aware and plans to have son pick up today and get started. Education provided on use.   All questions answered and support provided.  Goals of Care William Faulkner continues to show signs of decline and poor overall performance state. His quality of life is slowly diminishing however patient and wife continue to remain hopeful for stability. His treatments are currently on hold. They are clear in their expressed wishes to continue to treat the treatable allowing him every opportunity to continue to thrive for as long as he can while managing symptoms. They are not interested in pursuing a more comfort approach with support of hospice care at this time. However aware this may be needed in the near future if no improvement or further decline.   I discussed the importance of continued conversation with family and their medical providers regarding overall plan of care and treatment options, ensuring decisions are within the context of the patients values and GOCs.  Assessment and Plan  Decreased appetite  Poor appetite and decreased  oral intake. No reported constipation or diarrhea. Patient is trying to drink protein shakes. - Marinol prescription approved. Wife aware and plans to pick up later today.  -Education provided on focusing on small frequent meals versus large meals.   Pressure Ulcer Reports a sore between buttocks that is causing discomfort. Patient is using a salve and sitting on a cushion. Managed by [facility] however feels he is now in need of support at wound care center. -Continue current management. -Referral placed to Wound Clinic per Dr. Cherly Hensen -Patient not able to come in for appointment. HH RN is going to send in pictures for review.   Pain Management No reported uncontrolled pain. Currently on Tylenol, Dexamethasone  Goals of Care Patient and wife continue to express wishes to continue to treat the treatable allowing him every opportunity to continue to thrive for as long as he can while managing symptoms. They are NOT interested in pursuing a more comfort approach with support of hospice care at this time.  -Ongoing goc discussions and support.  -Will discuss as they may be appropriate to engage with in home palliative allowing a bridge in care and support when ready.   General Health Maintenance Low energy level, spending most of the day in a chair watching TV and napping intermittently. -Continue current medications and monitor symptoms. -Ongoing goals of care discussions. -I will plan to see patient back in 2-3 weeks. Sooner if needed.  Patient expressed understanding and was in agreement with this plan. He also understands that He can call the clinic at any time with any questions, concerns, or complaints.   Any controlled substances utilized were prescribed in the context of palliative care. PDMP has been reviewed.   Visit consisted of counseling and education dealing with the complex and emotionally intense issues of symptom management and palliative care in the setting of serious and  potentially life-threatening illness.  Willette Alma, AGPCNP-BC  Palliative Medicine Team/[facility]

## 2023-03-19 ENCOUNTER — Inpatient Hospital Stay: Payer: Medicare Other

## 2023-03-19 ENCOUNTER — Other Ambulatory Visit (HOSPITAL_COMMUNITY): Payer: Self-pay

## 2023-03-24 ENCOUNTER — Telehealth: Payer: Self-pay

## 2023-03-24 NOTE — Telephone Encounter (Signed)
 Physician orders (order number: [id number]) signed and faxed back to [facility] at [phone]. Form sent for scanning.

## 2023-03-25 ENCOUNTER — Encounter: Payer: Self-pay | Admitting: Internal Medicine

## 2023-03-25 ENCOUNTER — Other Ambulatory Visit: Payer: Self-pay

## 2023-03-25 ENCOUNTER — Other Ambulatory Visit: Payer: Self-pay | Admitting: Cardiology

## 2023-03-26 ENCOUNTER — Telehealth: Payer: Self-pay

## 2023-03-26 NOTE — Telephone Encounter (Signed)
 Physician orders (order number [id number] and [id number]) signed and faxed back to [facility] at [phone]. Forms sent for scanning.

## 2023-03-28 ENCOUNTER — Other Ambulatory Visit: Payer: Self-pay

## 2023-03-28 ENCOUNTER — Telehealth: Payer: Self-pay

## 2023-03-28 DIAGNOSIS — C7951 Secondary malignant neoplasm of bone: Secondary | ICD-10-CM

## 2023-03-28 MED ORDER — DEXAMETHASONE 2 MG PO TABS: 2.0000 mg | ORAL_TABLET | Freq: Every day | ORAL | 0 refills | Status: DC

## 2023-03-28 NOTE — Progress Notes (Signed)
 Palliative Medicine [facility]  Telephone:(336) [phone] Fax:(336) [phone]   Name: PJ ZEHNER Date: 03/28/2023 MRN: [id number]  DOB: Jun 06, 1934  Patient Care Team: Wanda Plump, MD as PCP - General (Internal Medicine) [name] Madolyn Frieze, MD as PCP - Cardiology (Cardiology) [name] Madolyn Frieze, MD as Consulting Physician (Cardiology) Beverely Low, MD as Consulting Physician (Orthopedic Surgery) Sheran Luz, MD as Consulting Physician (Physical Medicine and Rehabilitation) Nelson Chimes, MD as Consulting Physician (Ophthalmology) Jannifer Hick, MD as Consulting Physician (Urology) Cherlyn Cushing, RN as Oncology Nurse Navigator Pickenpack-Cousar, Arty Baumgartner, NP as Nurse Practitioner [facility] and Palliative Medicine)    INTERVAL HISTORY: SHMUEL GIRGIS is a 89 y.o. male with oncologic medical history including prostate cancer (12/2022) with metastatic disease to the bone. Palliative ask to see for symptom management and goals of care.   SOCIAL HISTORY:     reports that he quit smoking about 39 years ago. His smoking use included cigarettes. He started smoking about 54 years ago. He has a 4.5 pack-year smoking history. He has never used smokeless tobacco. He reports that he does not drink alcohol and does not use drugs.  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  MEDICATIONS:  Current Outpatient Medications   Medication Sig Dispense Refill   apalutamide (ERLEADA) 60 MG tablet Take 3 tablets (180 mg total) by mouth daily. May discontinue bicalutamide when starts apalutamide 90 tablet 11   aspirin 81 MG tablet Take 81 mg by mouth daily.     augmented betamethasone dipropionate (DIPROLENE-AF) 0.05 % cream Apply topically 2 (two) times daily. 60 g 0   cholecalciferol (VITAMIN D) 1000 UNITS tablet Take 1,000 Units by mouth daily.     Cyanocobalamin ([employer]S VITAMIN B-12) 5000 MCG SUBL Take 5,000 mcg by mouth daily.     dexamethasone (DECADRON) 2 MG tablet Take 1 tablet (2 mg total) by mouth daily. 14 tablet 0   dronabinol (MARINOL) 5 MG capsule Take 1 capsule (5 mg total) by mouth 2 (two) times daily before a meal. 60 capsule 0   feeding supplement (ENSURE ENLIVE / ENSURE PLUS) LIQD Take 237 mLs by mouth 2 (two) times daily between meals. 14220 mL 0   insulin aspart (NOVOLOG) 100 UNIT/ML injection Inject 0-9 Units into the skin 3 (three) times daily with meals. Sliding scale CBG 70 - 120: 0 units CBG 121 - 150: 1 unit,  CBG 151 - 200: 2 units,  CBG 201 - 250: 3 units,  CBG 251 - 300: 5 units,  CBG 301 - 350: 7 units,  CBG 351 - 400: 9 units   CBG > 400: 9 units and notify your MD     ketoconazole (NIZORAL) 2 % cream Apply 1 application topically daily. 30 g 0   nystatin (MYCOSTATIN/NYSTOP) powder Apply topically 3 (three) times daily. Apply to affected area.  ondansetron (ZOFRAN) 8 MG tablet Take 1 tablet (8 mg total) by mouth every 8 (eight) hours as needed for nausea or vomiting. 30 tablet 3   pantoprazole (PROTONIX) 40 MG tablet Take 1 tablet (40 mg total) by mouth daily at 6 (six) AM.     polyethylene glycol powder (MIRALAX) 17 GM/SCOOP powder Take 17 g by mouth daily. For constipation.     propranolol (INDERAL) 40 MG tablet Take 0.5 tablets (20 mg total) by mouth daily.     rosuvastatin (CRESTOR) 20 MG tablet TAKE 1 TABLET BY MOUTH DAILY 30 tablet 0   senna-docusate (SENOKOT-S) 8.6-50 MG tablet Take 1  tablet by mouth 2 (two) times daily between meals as needed for mild constipation.     timolol (TIMOPTIC) 0.5 % ophthalmic solution Place 1 drop into both eyes every morning. As directed     No current facility-administered medications for this visit.    VITAL SIGNS: There were no vitals taken for this visit. There were no vitals filed for this visit.  Estimated body mass index is 22.47 kg/m as calculated from the following:   Height as of 02/26/23: 6\' 2"  (1.88 m).   Weight as of 02/26/23: 175 lb (79.4 kg).   PERFORMANCE STATUS (ECOG) : 1 - Symptomatic but completely ambulatory   Physical Exam General: NAD, thin, weak appearing  Cardiovascular: regular rate and rhythm Pulmonary: normal breathing pattern  Extremities: no edema, no joint deformities Skin: no rashes, sacral wound not assessed Neurological: AAO x3  Discussed the use of AI scribe software for clinical note transcription with the patient, who gave verbal consent to proceed.  IMPRESSION:  Mr. Leedy presents to clinic today with sacral pain and goals of care discussion. He is accompanied by his wife, Charlie Pitter and son, Jomarie Longs. Patient is in a wheelchair. Uncomfortable complaining of sacral discomfort due to known wound. Continues to have ongoing fatigue and minimal appetite. He has been taking Marinol with little to no improvement. Wife reports he is drinking Ensure to support his nutritional needs. Denies concerns with nausea, vomiting, constipation, or diarrhea.   He is experiencing issues with wound care management. A nurse initially identified the need for wound care, but there has been confusion and inconsistency in the care provided. One nurse suggested using 'blood paste' while another recommended 'burn gel', with the latter seeming more effective according to the family. He has been using 6x6 inch silicone pads for the wound, which are easy to work with but cause burning upon changing. The frequency of changing these  pads has been adjusted from daily to every two to three days for comfort and support. Patient initially had appointment scheduled at wound clinic however given goals of care will attempt to manage in the home.   He experiences discomfort, particularly in the spine, which affects his ability to go to bed at night comfortably. He has a history of adverse reactions to pain medications. Family is reluctant to consider medications due to previous responses including renal failure and lethargy.   Goals of Care Patient was seen by Dr. Cherly Hensen today who has recommended in-home hospice support. Mr. Arteaga quality of life continues to decline with no meaningful signs of recovery or ability to tolerate further treatment.   We discussed his current condition and disease trajectory. Patient and family are realistic in their understanding expressing focus is on maintaining his comfort in the home. I empathetically continued ongoing discussions regarding hospice. Extensive education provided on hospice's goals and philosophy of care  including referral process, what care would look like in the home, symptom/medication management, and steps in case of an emergency. They verbalized understanding and appreciation. Family confirms wishes to proceed with outpatient hospice referral with their choice of [facility].   We discussed healthcare limitations and reviewed documented advanced directives. Mr. Medeiros expresses his desire for natural death with no life-sustaining interventions. Confirms DNR/DNI. Education provided on MOST form and document completed as requested. The patient and family outlined their wishes for the following treatment decisions:  Cardiopulmonary Resuscitation: Do Not Attempt Resuscitation (DNR/No CPR)  Medical Interventions: Limited Additional Interventions: Use medical treatment, IV fluids and cardiac monitoring as indicated, DO NOT USE intubation or mechanical ventilation. May  consider use of less invasive airway support such as BiPAP or CPAP. Also provide comfort measures. Transfer to the hospital if indicated. Avoid intensive care.   Antibiotics: Determine use of limitation of antibiotics when infection occurs  IV Fluids: No IV fluids (provide other measures to ensure comfort)  Feeding Tube: No feeding tube    I discussed the importance of continued conversation with family and their medical providers regarding overall plan of care and treatment options, ensuring decisions are within the context of the patients values and GOCs.  Assessment and Plan  Wound Care  Current management with silicone pads and burn gel.  -Continue current wound care regimen until hospice services are initiated. They will then assist with managing.   End of Life Care/Goals of Care Discussed hospice care and benefits including home health care, social work, and equipment provision. Patient and family are clear in expressed wishes to proceed with referral as his goal is to focus on his comfort in the home for what time he has left.  -Referral to [facility] for consultation and potential initiation of hospice care.  Advanced Care Planning Discussed and completed Do Not Resuscitate (DNR) and Medical Orders for Scope of Treatment (MOST) forms. -Continue current care plan as outlined in DNR and MOST forms. See above. -Original provided to patient, copy placed into Vynca.   No follow-up needed at this time. Patient and family aware they may contact office as needed.  Patient expressed understanding and was in agreement with this plan. He also understands that He can call the clinic at any time with any questions, concerns, or complaints.   Any controlled substances utilized were prescribed in the context of palliative care. PDMP has been reviewed.   Visit consisted of counseling and education dealing with the complex and emotionally intense issues of symptom management and  palliative care in the setting of serious and potentially life-threatening illness.  Willette Alma, AGPCNP-BC  Palliative Medicine Team/[facility]

## 2023-03-28 NOTE — Telephone Encounter (Signed)
 Spoke w/ [facility]- verbal orders for PT.

## 2023-03-28 NOTE — Progress Notes (Signed)
 Patient's wife called back and reported that prescribed held [facility] for one week and then restarted on reduced dose of 2 tablets every day. Wife reports they have enough on hand for at least 2 weeks. Pharmacy will reach out in 2 weeks and wife is aware to call pharmacy if they need a refill or dose changes before that time.

## 2023-03-28 NOTE — Telephone Encounter (Signed)
 Copied from CRM [id number]. Topic: Clinical - Home Health Verbal Orders >> Mar 28, 2023  9:46 AM Lennart Pall wrote: Caller/Agency: Illene Bolus [facility] Callback Number: [phone] Service Requested: Physical Therapy Frequency: 1 time a week for 2 weeks Any new concerns about the patient? No

## 2023-03-28 NOTE — Telephone Encounter (Signed)
 Pt wife called for refill of dex, medication changed per Dr.Chang to 2mg  tab once a day. Pt wife updated, verbalized understanding, no further needs at this time.

## 2023-03-30 NOTE — Progress Notes (Unsigned)
 Patient Care Team: Wanda Plump, MD as PCP - General (Internal Medicine) [name] Madolyn Frieze, MD as PCP - Cardiology (Cardiology) [name] Madolyn Frieze, MD as Consulting Physician (Cardiology) Beverely Low, MD as Consulting Physician (Orthopedic Surgery) Sheran Luz, MD as Consulting Physician (Physical Medicine and Rehabilitation) Nelson Chimes, MD as Consulting Physician (Ophthalmology) Jannifer Hick, MD as Consulting Physician (Urology) Cherlyn Cushing, RN as Oncology Nurse Navigator Pickenpack-Cousar, Arty Baumgartner, NP as Nurse Practitioner [facility] and Palliative Medicine)  Clinic Day:  03/30/2023  Referring physician: Wanda Plump, MD  ASSESSMENT & PLAN:   Assessment & Plan: No problem-specific Assessment & Plan notes found for this encounter.    The patient understands the plans discussed today and is in agreement with them.  He knows to contact our office if he develops concerns prior to his next appointment.  Melven Sartorius, MD  [facility] - A DEPT OF [facility] [address] [place] [address] Dept: [phone] Dept Fax: [phone]   No orders of the defined types were placed in this encounter.     CHIEF COMPLAINT:  CC: ***  Current Treatment:  ***  INTERVAL HISTORY:  William Faulkner is here today for repeat clinical assessment. He denies fevers or chills. He denies pain. His appetite is good. His weight {Weight change:10426}.  I have reviewed the past medical history, past surgical history, social history and family history with the patient and they are unchanged from previous note.  ALLERGIES:  is allergic to atorvastatin, celecoxib, sulfonamide derivatives, and zolpidem tartrate.  MEDICATIONS:  Current Outpatient Medications  Medication Sig Dispense Refill   apalutamide (ERLEADA) 60 MG tablet Take 3 tablets (180 mg total) by mouth daily. May discontinue bicalutamide when starts apalutamide 90 tablet 11    aspirin 81 MG tablet Take 81 mg by mouth daily.     augmented betamethasone dipropionate (DIPROLENE-AF) 0.05 % cream Apply topically 2 (two) times daily. 60 g 0   cholecalciferol (VITAMIN D) 1000 UNITS tablet Take 1,000 Units by mouth daily.     Cyanocobalamin ([employer] VITAMIN B-12) 5000 MCG SUBL Take 5,000 mcg by mouth daily.     dexamethasone (DECADRON) 2 MG tablet Take 1 tablet (2 mg total) by mouth daily. 14 tablet 0   dronabinol (MARINOL) 5 MG capsule Take 1 capsule (5 mg total) by mouth 2 (two) times daily before a meal. 60 capsule 0   feeding supplement (ENSURE ENLIVE / ENSURE PLUS) LIQD Take 237 mLs by mouth 2 (two) times daily between meals. 14220 mL 0   insulin aspart (NOVOLOG) 100 UNIT/ML injection Inject 0-9 Units into the skin 3 (three) times daily with meals. Sliding scale CBG 70 - 120: 0 units CBG 121 - 150: 1 unit,  CBG 151 - 200: 2 units,  CBG 201 - 250: 3 units,  CBG 251 - 300: 5 units,  CBG 301 - 350: 7 units,  CBG 351 - 400: 9 units   CBG > 400: 9 units and notify your MD     ketoconazole (NIZORAL) 2 % cream Apply 1 application topically daily. 30 g 0   nystatin (MYCOSTATIN/NYSTOP) powder Apply topically 3 (three) times daily. Apply to affected area.     ondansetron (ZOFRAN) 8 MG tablet Take 1 tablet (8 mg total) by mouth every 8 (eight) hours as needed for nausea or vomiting. 30 tablet 3   pantoprazole (PROTONIX) 40 MG tablet Take 1 tablet (40 mg total)  by mouth daily at 6 (six) AM.     polyethylene glycol powder (MIRALAX) 17 GM/SCOOP powder Take 17 g by mouth daily. For constipation.     propranolol (INDERAL) 40 MG tablet Take 0.5 tablets (20 mg total) by mouth daily.     rosuvastatin (CRESTOR) 20 MG tablet TAKE 1 TABLET BY MOUTH DAILY 30 tablet 0   senna-docusate (SENOKOT-S) 8.6-50 MG tablet Take 1 tablet by mouth 2 (two) times daily between meals as needed for mild constipation.     timolol (TIMOPTIC) 0.5 % ophthalmic solution Place 1 drop into both eyes every morning. As  directed     No current facility-administered medications for this visit.    HISTORY OF PRESENT ILLNESS:   Oncology History  Prostate cancer (HCC)  01/18/2023 Imaging   CTA CAP 1. Negative for Aortic Dissection. Stable mild fusiform aneurysmal enlargement of the ascending aorta. Severe Aortic Atherosclerosis (ICD10-I70.0).   2. Positive for a subacute and pathologic appearing T12 compression fracture, with underlying lytic lesion and loss of height progressed since November. No retropulsion or other complicating features. In conjunction with November CT findings of skeletal metastatic disease, abnormal bladder base and prostate as detailed on that exam.   3. No other acute finding in the Chest, Abdomen, or Pelvis. Cholelithiasis.   01/18/2023 Imaging   MRI T spine 1. Multiple metastatic deposits throughout the thoracic spine as described. 2. No pathologic fractures. 3. Central disc protrusion at T8-9 effaces the ventral CSF. 4. Rightward disc protrusion and annular tear at T11-12 without focal stenosis   01/20/2023 Imaging   Bone survey IMPRESSION: 1. Patient with known spinal osseous bone lesions. This was better assessed on recent CT and MRI. The majority of these lesions are not well-defined by radiograph. 2. The known T12 compression fracture is not well assessed on the current exam due to osseous overlap. 3. The known left iliac sclerotic lesion is not well seen by radiograph. 4. Area of sclerosis projects over the lateral aspect of the right distal tibia at the ankle joint, indeterminate.   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   VITALS:  There were no vitals taken for this visit.  Wt Readings from Last 3 Encounters:  02/26/23 175 lb (79.4 kg)  01/18/23 184 lb (83.5 kg)  09/18/22 200 lb 4 oz (90.8 kg)    There is no height or weight on file to calculate BMI.  Performance status (ECOG): {CHL ONC [id number]  PHYSICAL EXAM:   GENERAL:alert, no distress and comfortable SKIN: skin color normal, no rashes  EYES: normal, sclera clear OROPHARYNX: no exudate, no erythema    NECK: supple,  non-tender, without nodularity LYMPH:  no palpable cervical lymphadenopathy LUNGS: clear to auscultation with normal breathing effort.  No wheeze or  rales HEART: regular rate & rhythm and no murmurs and no lower extremity edema ABDOMEN: abdomen soft, non-tender and nondistended Musculoskeletal: no edema NEURO: alert, fluent speech, no focal motor/sensory deficits.  Strength and sensation equal bilaterally.  LABORATORY DATA:  I have reviewed the data as listed    Component Value Date/Time   NA 134 (L) 03/05/2023 1500   NA 140 01/08/2022 0911   K 4.0 03/05/2023 1500   CL 101 03/05/2023 1500   CO2 23 03/05/2023 1500   GLUCOSE 169 (H) 03/05/2023 1500   BUN 22 03/05/2023 1500   BUN 33 (H) 01/08/2022 0911   CREATININE 1.19 03/05/2023 1500   CREATININE 1.09 10/30/2015 1211   CALCIUM 9.7 03/05/2023 1500   PROT 6.7 03/05/2023 1500   PROT 6.5 01/08/2022 0911   ALBUMIN 3.5 03/05/2023 1500   ALBUMIN 4.2 01/08/2022 0911   AST 14 (L) 03/05/2023 1500   ALT 9 03/05/2023 1500   ALKPHOS 103 03/05/2023 1500   BILITOT 0.4 03/05/2023 1500   GFRNONAA 59 (L) 03/05/2023 1500    GFRNONAA 64 12/08/2013 1222   GFRAA 58 (L) 12/01/2019 1051   GFRAA 74 12/08/2013 1222    No results found for: "SPEP", "UPEP"  Lab Results  Component Value Date   WBC 8.0 03/05/2023   NEUTROABS 5.9 03/05/2023   HGB 14.2 03/05/2023   HCT 43.5 03/05/2023   MCV 86.8 03/05/2023   PLT 316 03/05/2023      Chemistry      Component Value Date/Time   NA 134 (L) 03/05/2023 1500   NA 140 01/08/2022 0911   K 4.0 03/05/2023 1500   CL 101 03/05/2023 1500   CO2 23 03/05/2023 1500   BUN 22 03/05/2023 1500   BUN 33 (H) 01/08/2022 0911   CREATININE 1.19 03/05/2023 1500   CREATININE 1.09 10/30/2015 1211      Component Value Date/Time   CALCIUM 9.7 03/05/2023 1500   ALKPHOS 103 03/05/2023 1500   AST 14 (L) 03/05/2023 1500   ALT 9 03/05/2023 1500   BILITOT 0.4 03/05/2023 1500       RADIOGRAPHIC STUDIES: I have personally reviewed the radiological images as listed and agreed with the findings in the report. No results found.

## 2023-03-30 NOTE — Assessment & Plan Note (Signed)
 marinol 5 mg twice daily

## 2023-03-30 NOTE — Assessment & Plan Note (Signed)
 Resume apalutamide at 120 mg daily for 2 weeks and reassess Hold on start ADT for now due to his weakness. Consider in near future if performance status improves. CBC, CMP, PSA and testosterone when return tomorrow.

## 2023-03-31 ENCOUNTER — Encounter: Payer: Self-pay | Admitting: Nurse Practitioner

## 2023-03-31 ENCOUNTER — Telehealth: Payer: Self-pay

## 2023-03-31 ENCOUNTER — Inpatient Hospital Stay: Payer: Medicare Other

## 2023-03-31 ENCOUNTER — Inpatient Hospital Stay (HOSPITAL_BASED_OUTPATIENT_CLINIC_OR_DEPARTMENT_OTHER): Payer: Medicare Other | Admitting: Nurse Practitioner

## 2023-03-31 VITALS — BP 113/55 | HR 127 | Temp 97.5°F | Resp 18 | Ht 74.0 in | Wt 155.6 lb

## 2023-03-31 DIAGNOSIS — R634 Abnormal weight loss: Secondary | ICD-10-CM

## 2023-03-31 DIAGNOSIS — R630 Anorexia: Secondary | ICD-10-CM

## 2023-03-31 DIAGNOSIS — G893 Neoplasm related pain (acute) (chronic): Secondary | ICD-10-CM | POA: Diagnosis not present

## 2023-03-31 DIAGNOSIS — Z515 Encounter for palliative care: Secondary | ICD-10-CM | POA: Diagnosis not present

## 2023-03-31 DIAGNOSIS — R530 Neoplastic (malignant) related fatigue: Secondary | ICD-10-CM

## 2023-03-31 DIAGNOSIS — C7951 Secondary malignant neoplasm of bone: Secondary | ICD-10-CM | POA: Diagnosis present

## 2023-03-31 DIAGNOSIS — Z7189 Other specified counseling: Secondary | ICD-10-CM

## 2023-03-31 DIAGNOSIS — C61 Malignant neoplasm of prostate: Secondary | ICD-10-CM

## 2023-03-31 DIAGNOSIS — Z79899 Other long term (current) drug therapy: Secondary | ICD-10-CM | POA: Diagnosis not present

## 2023-03-31 LAB — CMP (CANCER CENTER ONLY)
ALT: 8 U/L (ref 0–44)
AST: 14 U/L — ABNORMAL LOW (ref 15–41)
Albumin: 3.8 g/dL (ref 3.5–5.0)
Alkaline Phosphatase: 98 U/L (ref 38–126)
Anion gap: 10 (ref 5–15)
BUN: 29 mg/dL — ABNORMAL HIGH (ref 8–23)
CO2: 27 mmol/L (ref 22–32)
Calcium: 9.7 mg/dL (ref 8.9–10.3)
Chloride: 100 mmol/L (ref 98–111)
Creatinine: 1.27 mg/dL — ABNORMAL HIGH (ref 0.61–1.24)
GFR, Estimated: 55 mL/min — ABNORMAL LOW (ref >60)
Glucose, Bld: 157 mg/dL — ABNORMAL HIGH (ref 70–99)
Potassium: 4 mmol/L (ref 3.5–5.1)
Sodium: 137 mmol/L (ref 135–145)
Total Bilirubin: 0.7 mg/dL (ref 0.0–1.2)
Total Protein: 6.5 g/dL (ref 6.5–8.1)

## 2023-03-31 LAB — CBC WITH DIFFERENTIAL (CANCER CENTER ONLY)
Abs Immature Granulocytes: 0.05 10*3/uL (ref 0.00–0.07)
Basophils Absolute: 0 10*3/uL (ref 0.0–0.1)
Basophils Relative: 0 %
Eosinophils Absolute: 0 10*3/uL (ref 0.0–0.5)
Eosinophils Relative: 0 %
HCT: 44.1 % (ref 39.0–52.0)
Hemoglobin: 14.4 g/dL (ref 13.0–17.0)
Immature Granulocytes: 1 %
Lymphocytes Relative: 14 %
Lymphs Abs: 1.5 10*3/uL (ref 0.7–4.0)
MCH: 28.7 pg (ref 26.0–34.0)
MCHC: 32.7 g/dL (ref 30.0–36.0)
MCV: 87.8 fL (ref 80.0–100.0)
Monocytes Absolute: 0.7 10*3/uL (ref 0.1–1.0)
Monocytes Relative: 6 %
Neutro Abs: 8.3 10*3/uL — ABNORMAL HIGH (ref 1.7–7.7)
Neutrophils Relative %: 79 %
Platelet Count: 316 10*3/uL (ref 150–400)
RBC: 5.02 MIL/uL (ref 4.22–5.81)
RDW: 14 % (ref 11.5–15.5)
WBC Count: 10.6 10*3/uL — ABNORMAL HIGH (ref 4.0–10.5)
nRBC: 0 % (ref 0.0–0.2)

## 2023-03-31 NOTE — Telephone Encounter (Signed)
 Physician orders (order number: [id number] and [id number]) signed and faxed back to [facility] at [phone]. Forms sent for scanning.

## 2023-03-31 NOTE — Assessment & Plan Note (Addendum)
 Report improved.   May continue as needed Tylenol.  Max Tylenol 3000 mg daily. Continue dexamethasone.  Currently 2 mg daily.  Will transition to home hospice and continue management per hospice

## 2023-03-31 NOTE — Assessment & Plan Note (Signed)
 Discussed today. Will transition to hospice.

## 2023-04-01 ENCOUNTER — Other Ambulatory Visit (HOSPITAL_COMMUNITY): Payer: Self-pay

## 2023-04-01 LAB — TESTOSTERONE: Testosterone: 7 ng/dL — ABNORMAL LOW (ref 264–916)

## 2023-04-01 LAB — PROSTATE-SPECIFIC AG, SERUM (LABCORP): Prostate Specific Ag, Serum: 2.5 ng/mL (ref 0.0–4.0)

## 2023-04-07 ENCOUNTER — Other Ambulatory Visit: Payer: Self-pay

## 2023-04-10 ENCOUNTER — Other Ambulatory Visit: Payer: Self-pay | Admitting: Pharmacy Technician

## 2023-04-10 ENCOUNTER — Other Ambulatory Visit: Payer: Self-pay

## 2023-04-10 NOTE — Progress Notes (Signed)
 Disenrolled; Patient is Hospice confirmed with Alan Ripper

## 2023-04-16 ENCOUNTER — Other Ambulatory Visit: Payer: Self-pay

## 2023-04-17 ENCOUNTER — Encounter (HOSPITAL_BASED_OUTPATIENT_CLINIC_OR_DEPARTMENT_OTHER): Payer: Medicare Other | Admitting: General Surgery

## 2023-06-22 DEATH — deceased
